# Patient Record
Sex: Male | Born: 1940 | Race: White | Hispanic: No | Marital: Married | Smoking: Current every day smoker
Health system: Southern US, Community
[De-identification: ages and names within clinical notes are randomized; demographics above are authoritative.]

## PROBLEM LIST (undated history)

## (undated) DIAGNOSIS — F32A Depression, unspecified: Secondary | ICD-10-CM

## (undated) DIAGNOSIS — F329 Major depressive disorder, single episode, unspecified: Secondary | ICD-10-CM

## (undated) DIAGNOSIS — I10 Essential (primary) hypertension: Secondary | ICD-10-CM

## (undated) DIAGNOSIS — I499 Cardiac arrhythmia, unspecified: Secondary | ICD-10-CM

## (undated) DIAGNOSIS — K5790 Diverticulosis of intestine, part unspecified, without perforation or abscess without bleeding: Secondary | ICD-10-CM

## (undated) DIAGNOSIS — R140 Abdominal distension (gaseous): Secondary | ICD-10-CM

## (undated) DIAGNOSIS — J40 Bronchitis, not specified as acute or chronic: Secondary | ICD-10-CM

## (undated) DIAGNOSIS — T462X5A Adverse effect of other antidysrhythmic drugs, initial encounter: Secondary | ICD-10-CM

## (undated) DIAGNOSIS — R05 Cough: Secondary | ICD-10-CM

## (undated) DIAGNOSIS — M810 Age-related osteoporosis without current pathological fracture: Secondary | ICD-10-CM

## (undated) DIAGNOSIS — R1032 Left lower quadrant pain: Secondary | ICD-10-CM

## (undated) DIAGNOSIS — R059 Cough, unspecified: Secondary | ICD-10-CM

## (undated) DIAGNOSIS — K469 Unspecified abdominal hernia without obstruction or gangrene: Secondary | ICD-10-CM

## (undated) DIAGNOSIS — I509 Heart failure, unspecified: Secondary | ICD-10-CM

## (undated) DIAGNOSIS — N189 Chronic kidney disease, unspecified: Secondary | ICD-10-CM

## (undated) DIAGNOSIS — E785 Hyperlipidemia, unspecified: Secondary | ICD-10-CM

## (undated) DIAGNOSIS — C801 Malignant (primary) neoplasm, unspecified: Secondary | ICD-10-CM

## (undated) DIAGNOSIS — R233 Spontaneous ecchymoses: Secondary | ICD-10-CM

## (undated) DIAGNOSIS — R238 Other skin changes: Secondary | ICD-10-CM

## (undated) HISTORY — DX: Heart failure, unspecified: I50.9

## (undated) HISTORY — DX: Malignant (primary) neoplasm, unspecified: C80.1

## (undated) HISTORY — DX: Cough: R05

## (undated) HISTORY — DX: Age-related osteoporosis without current pathological fracture: M81.0

## (undated) HISTORY — DX: Other skin changes: R23.8

## (undated) HISTORY — DX: Spontaneous ecchymoses: R23.3

## (undated) HISTORY — DX: Essential (primary) hypertension: I10

## (undated) HISTORY — DX: Adverse effect of other antidysrhythmic drugs, initial encounter: T46.2X5A

## (undated) HISTORY — PX: APPENDECTOMY: SHX54

## (undated) HISTORY — DX: Abdominal distension (gaseous): R14.0

## (undated) HISTORY — DX: Cough, unspecified: R05.9

## (undated) HISTORY — PX: CARDIAC CATHETERIZATION: SHX172

## (undated) HISTORY — DX: Left lower quadrant pain: R10.32

## (undated) HISTORY — DX: Unspecified abdominal hernia without obstruction or gangrene: K46.9

---

## 2003-10-30 HISTORY — PX: CARDIAC DEFIBRILLATOR REMOVAL: SHX1291

## 2009-05-12 ENCOUNTER — Encounter: Admission: RE | Admit: 2009-05-12 | Discharge: 2009-05-12 | Payer: Self-pay | Admitting: Family Medicine

## 2011-07-30 DIAGNOSIS — K469 Unspecified abdominal hernia without obstruction or gangrene: Secondary | ICD-10-CM

## 2011-07-30 HISTORY — DX: Unspecified abdominal hernia without obstruction or gangrene: K46.9

## 2011-08-24 ENCOUNTER — Ambulatory Visit (INDEPENDENT_AMBULATORY_CARE_PROVIDER_SITE_OTHER): Payer: Medicare Other | Admitting: General Surgery

## 2011-08-24 ENCOUNTER — Encounter (INDEPENDENT_AMBULATORY_CARE_PROVIDER_SITE_OTHER): Payer: Self-pay | Admitting: General Surgery

## 2011-08-24 VITALS — BP 118/74 | HR 64 | Temp 97.5°F | Resp 16 | Ht 70.0 in | Wt 153.0 lb

## 2011-08-24 DIAGNOSIS — K4090 Unilateral inguinal hernia, without obstruction or gangrene, not specified as recurrent: Secondary | ICD-10-CM

## 2011-08-24 NOTE — Progress Notes (Signed)
Chief Complaint  Patient presents with  . New Evaluation    eval of LIH     HPI Nicholas Herrera is a 70 y.o. male.  This patient is referred by Dr. Paulino Herrera for evaluation of a left inguinal hernia. He first started having discomfort in September and then subsequently noticed a bulge in his left groin which comes and goes. He has some associated discomfort which radiates to his testicle and he states that this is worse when he has multiple bowel movements. He also states that his discomfort is worse at night and is somewhat relieved with wearing a hernia belt. He denies any nausea vomiting or obstructive symptoms and has a colonoscopy approximately 5 years ago. He does have a defibrillator which has been turned off stent is followed by Dr. Katrinka Herrera is his cardiologist. He states that his last ejection fraction was noted to be at 40%. HPI  Past Medical History  Diagnosis Date  . Cancer     skin  . Cough   . Abdominal pain   . Left groin pain   . Hernia october 2012    [facility]  . Abdominal distention   . Easy bruising     Past Surgical History  Procedure Date  . Appendectomy   . Cardiac defibrillator removal 2005    Family History  Problem Relation Age of Onset  . Cancer Mother     Social History History  Substance Use Topics  . Smoking status: Current Everyday Smoker -- 1.0 packs/day for 0 years    Types: Cigars  . Smokeless tobacco: Never Used  . Alcohol Use: 3.5 oz/week    7 drink(s) per week    Allergies  Allergen Reactions  . Sulfur     Current Outpatient Prescriptions  Medication Sig Dispense Refill  . aspirin 325 MG tablet Take 325 mg by mouth daily.        . benazepril (LOTENSIN) 10 MG tablet       . carvedilol (COREG) 12.5 MG tablet       . Multiple Vitamins-Minerals (OCUVITE ADULT 50+ PO) Take by mouth daily.        . Multiple Vitamins-Minerals (ONE-A-DAY MENS 50+ ADVANTAGE PO) Take by mouth daily.        [name] NIASPAN 1000 MG CR tablet       . PARoxetine (PAXIL) 20  MG tablet       . simvastatin (ZOCOR) 40 MG tablet         Review of Systems Review of Systems  Respiratory: Positive for cough.   Gastrointestinal: Positive for abdominal pain and abdominal distention.  Hematological: Bruises/bleeds easily.  All other systems reviewed and are negative.    Blood pressure 118/74, pulse 64, temperature 97.5 F (36.4 C), temperature source Temporal, resp. rate 16, height 5\' 10"  (1.778 m), weight 153 lb (69.4 kg).  Physical Exam Physical Exam  Vitals reviewed. Constitutional: He is oriented to person, place, and time. He appears well-developed and well-nourished. No distress.  HENT:  Head: Normocephalic and atraumatic.  Mouth/Throat: No oropharyngeal exudate.  Eyes: Conjunctivae and EOM are normal. Pupils are equal, round, and reactive to light. Right eye exhibits no discharge. Left eye exhibits no discharge. No scleral icterus.  Neck: Normal range of motion. Neck supple. No tracheal deviation present.  Cardiovascular: Normal rate, regular rhythm and normal heart sounds.   Pulmonary/Chest: Effort normal and breath sounds normal. No stridor. No respiratory distress. He has no wheezes.  Abdominal: Soft. Bowel sounds are normal.  He exhibits no distension and no mass. There is no tenderness. There is no rebound and no guarding.  Genitourinary: Penis normal.       He has a reducible left inguinal hernia on exam without significant tenderness on exam today.  No evidence of right inguinal hernia with Valsalva.  Musculoskeletal: Normal range of motion. He exhibits no edema.  Neurological: He is alert and oriented to person, place, and time.  Skin: Skin is warm. No rash noted. He is not diaphoretic. No erythema. No pallor.  Psychiatric: He has a normal mood and affect. His behavior is normal. Judgment and thought content normal.    Data Reviewed  Assessment    Reducible left inguinal hernia  This hernia is reducible but is symptomatic. Patient would  like surgical repair due to his discomfort. I discussed with him the options for watchful waiting versus laparoscopic versus open repair and he would like to have open repair of his left inguinal hernia. I discussed with him the risks of infection, bleeding, pain, chronic pain, nerve injury, scarring and, and recurrence and loss of testicle and he expressed understanding and desires to proceed with open left inguinal hernia repair with mesh patches repair we will get official clearance from his cardiologist Dr. Katrinka Herrera.      Plan    We will plan for open left inguinal hernia repair with mesh and       Nicholas Herrera 08/24/2011, 2:19 PM

## 2011-09-05 ENCOUNTER — Other Ambulatory Visit (INDEPENDENT_AMBULATORY_CARE_PROVIDER_SITE_OTHER): Payer: Self-pay | Admitting: General Surgery

## 2011-09-06 ENCOUNTER — Other Ambulatory Visit (INDEPENDENT_AMBULATORY_CARE_PROVIDER_SITE_OTHER): Payer: Self-pay | Admitting: General Surgery

## 2011-09-07 ENCOUNTER — Encounter (HOSPITAL_COMMUNITY): Payer: Self-pay | Admitting: Respiratory Therapy

## 2011-09-07 ENCOUNTER — Encounter (HOSPITAL_COMMUNITY)
Admission: RE | Admit: 2011-09-07 | Discharge: 2011-09-07 | Disposition: A | Discharge status: Home / Self Care | Payer: Medicare Other | Source: Ambulatory Visit | Attending: Anesthesiology | Admitting: Anesthesiology

## 2011-09-07 ENCOUNTER — Encounter (HOSPITAL_COMMUNITY)
Admission: RE | Admit: 2011-09-07 | Discharge: 2011-09-07 | Disposition: A | Discharge status: Home / Self Care | Payer: Medicare Other | Source: Ambulatory Visit | Attending: General Surgery | Admitting: General Surgery

## 2011-09-07 ENCOUNTER — Encounter (HOSPITAL_COMMUNITY): Payer: Self-pay | Admitting: Pharmacy Technician

## 2011-09-07 ENCOUNTER — Encounter (HOSPITAL_COMMUNITY): Payer: Self-pay

## 2011-09-07 HISTORY — DX: Major depressive disorder, single episode, unspecified: F32.9

## 2011-09-07 HISTORY — DX: Diverticulosis of intestine, part unspecified, without perforation or abscess without bleeding: K57.90

## 2011-09-07 HISTORY — DX: Hyperlipidemia, unspecified: E78.5

## 2011-09-07 HISTORY — DX: Chronic kidney disease, unspecified: N18.9

## 2011-09-07 HISTORY — DX: Bronchitis, not specified as acute or chronic: J40

## 2011-09-07 HISTORY — DX: Depression, unspecified: F32.A

## 2011-09-07 HISTORY — DX: Cardiac arrhythmia, unspecified: I49.9

## 2011-09-07 LAB — BASIC METABOLIC PANEL
BUN: 12 mg/dL (ref 6–23)
CO2: 27 mEq/L (ref 19–32)
Calcium: 9.3 mg/dL (ref 8.4–10.5)
Chloride: 102 mEq/L (ref 96–112)
Creatinine, Ser: 0.92 mg/dL (ref 0.50–1.35)
GFR calc Af Amer: >90 mL/min (ref >90)
GFR calc non Af Amer: 84 mL/min — ABNORMAL LOW (ref >90)
Glucose, Bld: 91 mg/dL (ref 70–99)
Potassium: 4.2 mEq/L (ref 3.5–5.1)
Sodium: 139 mEq/L (ref 135–145)

## 2011-09-07 LAB — CBC
HCT: 43.2 % (ref 39.0–52.0)
Hemoglobin: 15.2 g/dL (ref 13.0–17.0)
MCH: 32.1 pg (ref 26.0–34.0)
MCHC: 35.2 g/dL (ref 30.0–36.0)
MCV: 91.1 fL (ref 78.0–100.0)
Platelets: 157 10*3/uL (ref 150–400)
RBC: 4.74 MIL/uL (ref 4.22–5.81)
RDW: 12.9 % (ref 11.5–15.5)
WBC: 9.1 10*3/uL (ref 4.0–10.5)

## 2011-09-07 LAB — SURGICAL PCR SCREEN
MRSA, PCR: NEGATIVE
Staphylococcus aureus: POSITIVE — AB

## 2011-09-07 NOTE — Pre-Procedure Instructions (Signed)
[name]  09/07/2011   Your procedure is scheduled on:  September 11, 2011  Report to [facility] at 0930 AM.  Call this number if you have problems the morning of surgery: [phone]   Remember:   Do not eat food:After Midnight.  Do not drink clear liquids: 4 Hours before arrival.(5:30am)  Take these medicines the morning of surgery with A SIP OF WATER: Coreg, Paxil   Do not wear jewelry, make-up or nail polish.  Do not wear lotions, powders, or perfumes. You may wear deodorant.  Do not shave 48 hours prior to surgery.  Do not bring valuables to the hospital.  Contacts, dentures or bridgework may not be worn into surgery.  Leave suitcase in the car. After surgery it may be brought to your room.  For patients admitted to the hospital, checkout time is 11:00 AM the day of discharge.   Patients discharged the day of surgery will not be allowed to drive home.  Name and phone number of your driver: Breeze Angell [phone]  Special Instructions: CHG Shower Use Special Wash: 1/2 bottle night before surgery and 1/2 bottle morning of surgery.   Please read over the following fact sheets that you were given: Pain Booklet, Coughing and Deep Breathing, MRSA Information and Surgical Site Infection Prevention

## 2011-09-07 NOTE — Progress Notes (Signed)
Called DR. Sherilyn Cooter Smith's office requested copy of recent EKG from August 2012, spoke with Shea Evans. Also faxed prescription for implanted cardiac device to Dr. Michaelle Copas office.

## 2011-09-07 NOTE — Progress Notes (Signed)
Patients defibrillator discussed with Chiropodist.  Physician order faxed to cardiologist Dr. Verdis Prime no need for anesthesia consult.

## 2011-09-10 ENCOUNTER — Encounter (HOSPITAL_COMMUNITY): Payer: Self-pay

## 2011-09-10 MED ORDER — CEFAZOLIN SODIUM 1-5 GM-% IV SOLN
1.0000 g | INTRAVENOUS | Status: DC
  Filled 2011-09-10: qty 50

## 2011-09-11 ENCOUNTER — Ambulatory Visit (HOSPITAL_COMMUNITY): Payer: Medicare Other | Admitting: Anesthesiology

## 2011-09-11 ENCOUNTER — Other Ambulatory Visit: Payer: Self-pay

## 2011-09-11 ENCOUNTER — Encounter (HOSPITAL_COMMUNITY): Payer: Self-pay | Admitting: Anesthesiology

## 2011-09-11 ENCOUNTER — Ambulatory Visit (HOSPITAL_COMMUNITY)
Admission: RE | Admit: 2011-09-11 | Discharge: 2011-09-11 | Disposition: A | Discharge status: Home / Self Care | Payer: Medicare Other | Source: Ambulatory Visit | Attending: General Surgery | Admitting: General Surgery

## 2011-09-11 ENCOUNTER — Encounter (HOSPITAL_COMMUNITY)
Admission: RE | Disposition: A | Discharge status: Home / Self Care | Payer: Self-pay | Source: Ambulatory Visit | Attending: General Surgery

## 2011-09-11 ENCOUNTER — Telehealth (INDEPENDENT_AMBULATORY_CARE_PROVIDER_SITE_OTHER): Payer: Self-pay

## 2011-09-11 DIAGNOSIS — K4090 Unilateral inguinal hernia, without obstruction or gangrene, not specified as recurrent: Secondary | ICD-10-CM | POA: Insufficient documentation

## 2011-09-11 DIAGNOSIS — Z01818 Encounter for other preprocedural examination: Secondary | ICD-10-CM | POA: Insufficient documentation

## 2011-09-11 DIAGNOSIS — Z01812 Encounter for preprocedural laboratory examination: Secondary | ICD-10-CM | POA: Insufficient documentation

## 2011-09-11 HISTORY — PX: INGUINAL HERNIA REPAIR: SHX194

## 2011-09-11 HISTORY — PX: HERNIA REPAIR: SHX51

## 2011-09-11 SURGERY — REPAIR, HERNIA, INGUINAL, ADULT
Anesthesia: Choice | Site: Groin | Laterality: Left | Wound class: Clean

## 2011-09-11 MED ORDER — MIDAZOLAM HCL 5 MG/5ML IJ SOLN
INTRAMUSCULAR | Status: DC | PRN
  Administered 2011-09-11: 2 mg via INTRAVENOUS

## 2011-09-11 MED ORDER — LIDOCAINE-EPINEPHRINE 1 %-1:100000 IJ SOLN
INTRAMUSCULAR | Status: DC | PRN
  Administered 2011-09-11: 30 mL

## 2011-09-11 MED ORDER — ROCURONIUM BROMIDE 100 MG/10ML IV SOLN
INTRAVENOUS | Status: DC | PRN
  Administered 2011-09-11 (×2): 5 mg via INTRAVENOUS
  Administered 2011-09-11: 50 mg via INTRAVENOUS

## 2011-09-11 MED ORDER — PROPOFOL 10 MG/ML IV EMUL
INTRAVENOUS | Status: DC | PRN
  Administered 2011-09-11: 100 mg via INTRAVENOUS
  Administered 2011-09-11: 30 mg via INTRAVENOUS

## 2011-09-11 MED ORDER — NEOSTIGMINE METHYLSULFATE 1 MG/ML IJ SOLN
INTRAMUSCULAR | Status: DC | PRN
  Administered 2011-09-11: 3 mg via INTRAVENOUS

## 2011-09-11 MED ORDER — FENTANYL CITRATE 0.05 MG/ML IJ SOLN
INTRAMUSCULAR | Status: DC | PRN
  Administered 2011-09-11: 100 ug via INTRAVENOUS
  Administered 2011-09-11: 25 ug via INTRAVENOUS
  Administered 2011-09-11: 50 ug via INTRAVENOUS
  Administered 2011-09-11: 25 ug via INTRAVENOUS

## 2011-09-11 MED ORDER — PROMETHAZINE HCL 25 MG/ML IJ SOLN: 6.2500 mg | INTRAMUSCULAR | Status: DC | PRN

## 2011-09-11 MED ORDER — ONDANSETRON HCL 4 MG/2ML IJ SOLN
INTRAMUSCULAR | Status: DC | PRN
  Administered 2011-09-11: 4 mg via INTRAVENOUS

## 2011-09-11 MED ORDER — CEFAZOLIN SODIUM 1-5 GM-% IV SOLN
INTRAVENOUS | Status: DC | PRN
  Administered 2011-09-11: 1 g via INTRAVENOUS

## 2011-09-11 MED ORDER — LACTATED RINGERS IV SOLN
INTRAVENOUS | Status: DC | PRN
  Administered 2011-09-11 (×2): via INTRAVENOUS

## 2011-09-11 MED ORDER — EPHEDRINE SULFATE 50 MG/ML IJ SOLN
INTRAMUSCULAR | Status: DC | PRN
  Administered 2011-09-11: 15 mg via INTRAVENOUS

## 2011-09-11 MED ORDER — HYDROMORPHONE HCL PF 1 MG/ML IJ SOLN: 0.2500 mg | INTRAMUSCULAR | Status: DC | PRN

## 2011-09-11 MED ORDER — MEPERIDINE HCL 25 MG/ML IJ SOLN: 6.2500 mg | INTRAMUSCULAR | Status: DC | PRN

## 2011-09-11 MED ORDER — ALBUMIN HUMAN 5 % IV SOLN
INTRAVENOUS | Status: DC | PRN
  Administered 2011-09-11: 12:00:00 via INTRAVENOUS

## 2011-09-11 MED ORDER — HYDROCODONE-ACETAMINOPHEN 5-500 MG PO TABS: 1.0000 | ORAL_TABLET | ORAL | Status: AC | PRN

## 2011-09-11 MED ORDER — GLYCOPYRROLATE 0.2 MG/ML IJ SOLN
INTRAMUSCULAR | Status: DC | PRN
  Administered 2011-09-11: .2 mg via INTRAVENOUS
  Administered 2011-09-11: .4 mg via INTRAVENOUS

## 2011-09-11 SURGICAL SUPPLY — 48 items
BLADE SURG 10 STRL SS (BLADE) ×2 IMPLANT
BLADE SURG 15 STRL LF DISP TIS (BLADE) ×1 IMPLANT
BLADE SURG 15 STRL SS (BLADE) ×1
BLADE SURG ROTATE 9660 (MISCELLANEOUS) IMPLANT
CANISTER SUCTION 2500CC (MISCELLANEOUS) ×2 IMPLANT
CHLORAPREP W/TINT 26ML (MISCELLANEOUS) ×2 IMPLANT
CLOTH BEACON ORANGE TIMEOUT ST (SAFETY) ×2 IMPLANT
COVER SURGICAL LIGHT HANDLE (MISCELLANEOUS) ×2 IMPLANT
DERMABOND ADVANCED (GAUZE/BANDAGES/DRESSINGS) ×1
DERMABOND ADVANCED .7 DNX12 (GAUZE/BANDAGES/DRESSINGS) ×1 IMPLANT
DRAIN PENROSE 1/2X12 LTX STRL (WOUND CARE) IMPLANT
DRAPE LAPAROSCOPIC ABDOMINAL (DRAPES) ×2 IMPLANT
ELECT CAUTERY BLADE 6.4 (BLADE) ×2 IMPLANT
ELECT REM PT RETURN 9FT ADLT (ELECTROSURGICAL) ×2
ELECTRODE REM PT RTRN 9FT ADLT (ELECTROSURGICAL) ×1 IMPLANT
GLOVE BIO SURGEON STRL SZ7 (GLOVE) ×2 IMPLANT
GLOVE BIOGEL PI IND STRL 7.0 (GLOVE) ×2 IMPLANT
GLOVE BIOGEL PI INDICATOR 7.0 (GLOVE) ×2
GLOVE SURG SS PI 6.5 STRL IVOR (GLOVE) ×4 IMPLANT
GLOVE SURG SS PI 7.5 STRL IVOR (GLOVE) ×4 IMPLANT
GOWN PREVENTION PLUS XLARGE (GOWN DISPOSABLE) ×2 IMPLANT
GOWN STRL NON-REIN LRG LVL3 (GOWN DISPOSABLE) ×2 IMPLANT
KIT BASIN OR (CUSTOM PROCEDURE TRAY) ×2 IMPLANT
KIT ROOM TURNOVER OR (KITS) ×2 IMPLANT
MESH ULTRAPRO 3X6 7.6X15CM (Mesh General) ×2 IMPLANT
NEEDLE HYPO 25GX1X1/2 BEV (NEEDLE) ×2 IMPLANT
NS IRRIG 1000ML POUR BTL (IV SOLUTION) ×2 IMPLANT
PACK SURGICAL SETUP 50X90 (CUSTOM PROCEDURE TRAY) ×2 IMPLANT
PAD ARMBOARD 7.5X6 YLW CONV (MISCELLANEOUS) ×2 IMPLANT
PAD DEFIB R2 (MISCELLANEOUS) ×2 IMPLANT
PENCIL BUTTON HOLSTER BLD 10FT (ELECTRODE) ×2 IMPLANT
SPECIMEN JAR SMALL (MISCELLANEOUS) IMPLANT
SPONGE INTESTINAL PEANUT (DISPOSABLE) ×2 IMPLANT
SPONGE LAP 18X18 X RAY DECT (DISPOSABLE) ×2 IMPLANT
SUT MNCRL AB 4-0 PS2 18 (SUTURE) ×2 IMPLANT
SUT PROLENE 2 0 SH DA (SUTURE) ×8 IMPLANT
SUT VIC AB 2-0 SH 27 (SUTURE) ×3
SUT VIC AB 2-0 SH 27XBRD (SUTURE) ×3 IMPLANT
SUT VIC AB 3-0 SH 27 (SUTURE) ×1
SUT VIC AB 3-0 SH 27X BRD (SUTURE) ×1 IMPLANT
SYR BULB 3OZ (MISCELLANEOUS) ×2 IMPLANT
SYR CONTROL 10ML LL (SYRINGE) ×2 IMPLANT
TOWEL OR 17X24 6PK STRL BLUE (TOWEL DISPOSABLE) ×2 IMPLANT
TOWEL OR 17X26 10 PK STRL BLUE (TOWEL DISPOSABLE) ×2 IMPLANT
TUBE CONNECTING 12X1/4 (SUCTIONS) IMPLANT
ULTRAPRO 3X6 ×2 IMPLANT
WATER STERILE IRR 1000ML POUR (IV SOLUTION) IMPLANT
YANKAUER SUCT BULB TIP NO VENT (SUCTIONS) IMPLANT

## 2011-09-11 NOTE — Progress Notes (Signed)
Call to Florentina Addison, at [facility]. She reports that pt.'s defibrillator was deactivated in 2008.  Ekg not available from within the past yr.

## 2011-09-11 NOTE — Progress Notes (Signed)
Via volunteer, Corrie Dandy, pt's wife notified that pt going to short stay.

## 2011-09-11 NOTE — Anesthesia Procedure Notes (Addendum)
Procedure Name: Intubation Date/Time: 09/11/2011 11:17 AM Performed by: Julianne Rice K Pre-anesthesia Checklist: Patient identified, Timeout performed, Emergency Drugs available, Suction available and Patient being monitored Patient Re-evaluated:Patient Re-evaluated prior to inductionOxygen Delivery Method: Circle System Utilized Preoxygenation: Pre-oxygenation with 100% oxygen Intubation Type: IV induction Ventilation: Mask ventilation without difficulty Laryngoscope Size: Mac and 3 Grade View: Grade II Tube type: Oral Tube size: 8.0 mm Number of attempts: 1 Airway Equipment and Method: stylet Placement Confirmation: ETT inserted through vocal cords under direct vision,  breath sounds checked- equal and bilateral and positive ETCO2 Secured at: 24 cm Tube secured with: Tape Dental Injury: Teeth and Oropharynx as per pre-operative assessment

## 2011-09-11 NOTE — Anesthesia Postprocedure Evaluation (Signed)
  Anesthesia Post-op Note  Patient: Nicholas Herrera  Procedure(s) Performed:  HERNIA REPAIR INGUINAL ADULT - open left inguinal hernia with mesh  Patient Location: PACU  Anesthesia Type: General  Level of Consciousness: awake and alert   Airway and Oxygen Therapy: Patient Spontanous Breathing  Post-op Pain: mild  Post-op Assessment: Post-op Vital signs reviewed, Patient's Cardiovascular Status Stable, Respiratory Function Stable, Patent Airway and No signs of Nausea or vomiting  Post-op Vital Signs: stable  Complications: No apparent anesthesia complications

## 2011-09-11 NOTE — Op Note (Signed)
NAME:  Nicholas Herrera, Nicholas Herrera NO.:  [id number]  MEDICAL RECORD NO.:  [id number]  LOCATION:  [facility]                         FACILITY:  [facility]  PHYSICIAN:  Lodema Pilot, MD       DATE OF BIRTH:  [birth year]  DATE OF PROCEDURE:  09/11/2011 DATE OF DISCHARGE:                              OPERATIVE REPORT   PROCEDURE:  Open left inguinal hernia repair with mesh.  PREOPERATIVE DIAGNOSIS:  Left inguinal hernia.  POSTOPERATIVE DIAGNOSIS:  Left inguinal hernia.  SURGEON:  Lodema Pilot, MD  ASSISTANT:  None.  ANESTHESIA:  General endotracheal anesthesia with 30 mL of 1% lidocaine with epinephrine and 0.25% Marcaine in a 50:50 mixture.  FLUIDS:  1000 mL of crystalloid and 250 mL of albumin.  ESTIMATED BLOOD LOSS:  Minimal.  DRAINS:  None.  SPECIMENS:  Hernia sac, but not sent to Pathology for permanent section.  COMPLICATIONS:  None apparent.  FINDINGS:  Indirect and direct inguinal defects repaired with 3 inch x 6 inch UltraPro mesh.  INDICATION FOR PROCEDURE:  Nicholas Herrera is a 70 year old male with a symptomatic left inguinal hernia since September who desires repair.  OPERATIVE DETAILS:  Nicholas Herrera was seen and evaluated in the preop area and risks and benefits of procedure were discussed in lay terms. Informed consent was obtained.  Surgical site was marked with the patient prior to anesthetic administration and prophylactic antibiotics were given.  General endotracheal tube anesthesia was obtained, and his abdomen was prepped and draped in a standard surgical fashion. Procedure time-out was performed with all operative team members to confirm proper patient, procedure, and a left inguinal oblique incision was made in the skin and dissection carried down to the subcutaneous tissue using Bovie electrocautery.  External oblique fascia was identified and sharply incised along the length of its fibers to the external ring and the ilioinguinal nerve was identified and  preserved. The spermatic cord was dissected circumferentially at the pubic tubercle and a Penrose drain was passed for gentle retraction.  The spermatic cord was skeletonized and on the anterior medial aspect of the cord there was an obvious hernia sac and this was dissected free from the cord structures.  He also had a direct hernia defect as well.  The indirect sac was dissected free from the cord and the sac was opened up and high ligation of the sac was performed and the hernia sac was divided, but not sent to Pathology.  Then the floor was imbricated over the direct defect with 2-0 Vicryl figure-of-eight sutures, and the internal ring was tightened.  Then a 3 inch x 6 inch piece of UltraPro mesh was cut to fit the inguinal canal and sutured in place at the pubic tubercle with 2-0 Prolene suture, and the suture was run along the shelving edge of the inguinal ligament.  A splint was placed on the lateral aspect of the mesh and the tails of mesh were passed around the cord to create a new internal ring and interrupted 2-0 Prolene sutures were placed medially and superiorly and laterally to secure the mesh to the abdominal wall.  The iliohypogastric nerve was identified and sutures were placed away from the  nerve with the mesh secured to the abdominal wall.  The tails of the mesh were tied together and the new internal ring was created.  The wound was then irrigated with sterile saline solution and noted to be hemostatic, and the external oblique fascia was approximated with a 2-0 Vicryl running suture in order to create a new external ring.  Then 30 mL of 1% lidocaine with epinephrine and 0.5% Marcaine with 50:50 mixture were injected in the wound and the Scarpa's fascia was approximated with a running 3-0 Vicryl suture.  The wound was again irrigated with sterile saline solution, and the skin edges were approximated with 4-0 Monocryl subcuticular suture.  Skin was washed and dried and  Dermabond was applied.  All sponge, needle, and instrument counts were correct at the end of the case and the patient tolerated the procedure well without apparent complications.          ______________________________ Lodema Pilot, MD     BL/MEDQ  D:  09/11/2011  T:  09/11/2011  Job:  [id number]

## 2011-09-11 NOTE — Telephone Encounter (Signed)
Left voice message with PO appointment info for patient.

## 2011-09-11 NOTE — Brief Op Note (Signed)
09/11/2011  12:57 PM  PATIENT:  Dolores Hoose  70 y.o. male  PRE-OPERATIVE DIAGNOSIS:  Hernia, inguinal, left [550.90]  POST-OPERATIVE DIAGNOSIS:  left inguinal hernia  PROCEDURE:  Procedure(s): HERNIA REPAIR INGUINAL ADULT  SURGEON:  Surgeon(s): Rulon Abide, DO  PHYSICIAN ASSISTANT:   ASSISTANTS: none   ANESTHESIA:   none  EBL:  Total I/O In: 1450 [I.V.:1200; IV Piggyback:250] Out: 25 [Blood:25]  BLOOD ADMINISTERED:none  DRAINS: none   LOCAL MEDICATIONS USED:  MARCAINE 15CC and LIDOCAINE 15CC  SPECIMEN:  No Specimen  DISPOSITION OF SPECIMEN:  N/A  COUNTS:  YES  TOURNIQUET:  * No tourniquets in log *  DICTATION: .Other Dictation: Dictation Number [id number]  PLAN OF CARE: Discharge to home after PACU  PATIENT DISPOSITION:  PACU - hemodynamically stable.   Delay start of Pharmacological VTE agent (>24hrs) due to surgical blood loss or risk of bleeding:  {YES/NO/NOT APPLICABLE:20182

## 2011-09-11 NOTE — Anesthesia Preprocedure Evaluation (Addendum)
Anesthesia Evaluation  Patient identified by MRN, date of birth, ID band Patient awake    Reviewed: Allergy & Precautions, H&P , NPO status , Patient's Chart, lab work & pertinent test results  Airway Mallampati: II TM Distance: >3 FB Neck ROM: full    Dental No notable dental hx. (+) Teeth Intact   Pulmonary neg pulmonary ROS,  clear to auscultation  Pulmonary exam normal       Cardiovascular hypertension, On Medications and On Home Beta Blockers + Cardiac Defibrillator regular Normal    Neuro/Psych Negative Neurological ROS  Negative Psych ROS   GI/Hepatic negative GI ROS, Neg liver ROS,   Endo/Other  Negative Endocrine ROS  Renal/GU negative Renal ROS  Genitourinary negative   Musculoskeletal   Abdominal   Peds  Hematology negative hematology ROS (+)   Anesthesia Other Findings   Reproductive/Obstetrics negative OB ROS                           Anesthesia Physical Anesthesia Plan  ASA: II  Anesthesia Plan: General   Post-op Pain Management:    Induction: Intravenous  Airway Management Planned: Oral ETT  Additional Equipment:   Intra-op Plan:   Post-operative Plan: Extubation in OR  Informed Consent: I have reviewed the patients History and Physical, chart, labs and discussed the procedure including the risks, benefits and alternatives for the proposed anesthesia with the patient or authorized representative who has indicated his/her understanding and acceptance.     Plan Discussed with: CRNA  Anesthesia Plan Comments:         Anesthesia Quick Evaluation

## 2011-09-11 NOTE — H&P (Signed)
Date of Initial H&P: 08/24/11  History reviewed, patient examined, no change in status, stable for surgery. Left side marked and risks of infection, bleeding, pain, scarring, recurrence, injury to vas deferens or testicle, bowel injury, and chronic pain again discussed and he desires to proceed with left inguinal hernia repair with mesh.  He has received cardiac clearance from his cardiologist.  Will plan for open LIH  [name], [name] 09/11/2011 10:30 AM

## 2011-09-11 NOTE — Preoperative (Signed)
Beta Blockers   Reason not to administer Beta Blockers:Not Applicable 

## 2011-09-11 NOTE — Transfer of Care (Signed)
Immediate Anesthesia Transfer of Care Note  Patient: Nicholas Herrera  Procedure(s) Performed:  HERNIA REPAIR INGUINAL ADULT - open left inguinal hernia with mesh  Patient Location: PACU  Anesthesia Type: General  Level of Consciousness: awake, alert  and oriented  Airway & Oxygen Therapy: Patient Spontanous Breathing and Patient connected to nasal cannula oxygen  Post-op Assessment: Report given to PACU RN, Post -op Vital signs reviewed and stable and Patient moving all extremities  Post vital signs: Reviewed and stable  Complications: No apparent anesthesia complications

## 2011-09-14 ENCOUNTER — Encounter (HOSPITAL_COMMUNITY): Payer: Self-pay | Admitting: General Surgery

## 2011-09-28 ENCOUNTER — Ambulatory Visit (INDEPENDENT_AMBULATORY_CARE_PROVIDER_SITE_OTHER): Payer: Medicare Other | Admitting: General Surgery

## 2011-09-28 ENCOUNTER — Encounter (INDEPENDENT_AMBULATORY_CARE_PROVIDER_SITE_OTHER): Payer: Self-pay | Admitting: General Surgery

## 2011-09-28 VITALS — BP 118/74 | HR 60 | Temp 97.7°F | Resp 16 | Ht 70.0 in | Wt 150.4 lb

## 2011-09-28 DIAGNOSIS — Z5189 Encounter for other specified aftercare: Secondary | ICD-10-CM

## 2011-09-28 DIAGNOSIS — Z4889 Encounter for other specified surgical aftercare: Secondary | ICD-10-CM

## 2011-09-28 NOTE — Progress Notes (Signed)
Subjective:     Patient ID: Nicholas Herrera, male   DOB: March 30, 1941, 70 y.o.   MRN: [id number]  HPI This patient follows up 2 weeks status post open left inguinal hernia repair with mesh. He is doing well but still has some occasional discomfort in the area.He states that his discomfort is improving and has gradually started to increase his normal activities but with increased activity he does have some increased discomfort. He states his bowels are functioning normally.  Review of Systems     Objective:   Physical Exam His incision is healing well with normal healing ridge. No evidence of recurrence with Valsalva. No other swelling or bulge in the region    Assessment:     Status post open left inguinal hernia repair with mesh-Doing well. I think his discomfort is appropriate for 2 weeks postoperative. It seems to be improving and there is no evidence recurrence I think that this should continue to improve with time.    Plan:     He will follow up in 2 months if his symptoms do not completely resolve. Otherwise he'll follow up in apparently this. He can gradually increase his activity and another 2 weeks 2 activity as tolerated.

## 2011-10-16 ENCOUNTER — Telehealth (INDEPENDENT_AMBULATORY_CARE_PROVIDER_SITE_OTHER): Payer: Self-pay | Admitting: General Surgery

## 2011-10-16 MED ORDER — HYDROCODONE-ACETAMINOPHEN 5-325 MG PO TABS: 1.0000 | ORAL_TABLET | Freq: Four times a day (QID) | ORAL | Status: AC | PRN

## 2011-10-16 NOTE — Telephone Encounter (Signed)
Patient is status post hernia repair asking for a refill of his hydrocodone. He has not had a refill. Per automatic refill protocol called norco 5/325 to [facility] [place]. Patient made aware.

## 2012-02-19 DIAGNOSIS — C44320 Squamous cell carcinoma of skin of unspecified parts of face: Secondary | ICD-10-CM | POA: Diagnosis not present

## 2012-02-19 DIAGNOSIS — L570 Actinic keratosis: Secondary | ICD-10-CM | POA: Diagnosis not present

## 2012-02-19 DIAGNOSIS — L820 Inflamed seborrheic keratosis: Secondary | ICD-10-CM | POA: Diagnosis not present

## 2012-02-19 DIAGNOSIS — D0430 Carcinoma in situ of skin of unspecified part of face: Secondary | ICD-10-CM | POA: Diagnosis not present

## 2012-02-19 DIAGNOSIS — D0439 Carcinoma in situ of skin of other parts of face: Secondary | ICD-10-CM | POA: Diagnosis not present

## 2012-06-11 DIAGNOSIS — R197 Diarrhea, unspecified: Secondary | ICD-10-CM | POA: Diagnosis not present

## 2012-06-11 DIAGNOSIS — K625 Hemorrhage of anus and rectum: Secondary | ICD-10-CM | POA: Diagnosis not present

## 2012-06-16 DIAGNOSIS — K625 Hemorrhage of anus and rectum: Secondary | ICD-10-CM | POA: Diagnosis not present

## 2012-06-24 DIAGNOSIS — I10 Essential (primary) hypertension: Secondary | ICD-10-CM | POA: Diagnosis not present

## 2012-06-24 DIAGNOSIS — E785 Hyperlipidemia, unspecified: Secondary | ICD-10-CM | POA: Diagnosis not present

## 2012-06-24 DIAGNOSIS — Z950 Presence of cardiac pacemaker: Secondary | ICD-10-CM | POA: Diagnosis not present

## 2012-06-24 DIAGNOSIS — I5040 Unspecified combined systolic (congestive) and diastolic (congestive) heart failure: Secondary | ICD-10-CM | POA: Diagnosis not present

## 2012-06-24 DIAGNOSIS — I739 Peripheral vascular disease, unspecified: Secondary | ICD-10-CM | POA: Diagnosis not present

## 2012-07-04 DIAGNOSIS — I739 Peripheral vascular disease, unspecified: Secondary | ICD-10-CM | POA: Diagnosis not present

## 2012-07-09 DIAGNOSIS — K625 Hemorrhage of anus and rectum: Secondary | ICD-10-CM | POA: Diagnosis not present

## 2012-07-09 DIAGNOSIS — K5730 Diverticulosis of large intestine without perforation or abscess without bleeding: Secondary | ICD-10-CM | POA: Diagnosis not present

## 2012-07-09 DIAGNOSIS — K648 Other hemorrhoids: Secondary | ICD-10-CM | POA: Diagnosis not present

## 2012-08-20 DIAGNOSIS — L821 Other seborrheic keratosis: Secondary | ICD-10-CM | POA: Diagnosis not present

## 2012-08-20 DIAGNOSIS — D0460 Carcinoma in situ of skin of unspecified upper limb, including shoulder: Secondary | ICD-10-CM | POA: Diagnosis not present

## 2012-08-20 DIAGNOSIS — C44319 Basal cell carcinoma of skin of other parts of face: Secondary | ICD-10-CM | POA: Diagnosis not present

## 2012-08-20 DIAGNOSIS — D485 Neoplasm of uncertain behavior of skin: Secondary | ICD-10-CM | POA: Diagnosis not present

## 2012-08-20 DIAGNOSIS — D044 Carcinoma in situ of skin of scalp and neck: Secondary | ICD-10-CM | POA: Diagnosis not present

## 2012-08-20 DIAGNOSIS — L570 Actinic keratosis: Secondary | ICD-10-CM | POA: Diagnosis not present

## 2012-09-08 DIAGNOSIS — C44320 Squamous cell carcinoma of skin of unspecified parts of face: Secondary | ICD-10-CM | POA: Diagnosis not present

## 2012-09-09 DIAGNOSIS — Z79899 Other long term (current) drug therapy: Secondary | ICD-10-CM | POA: Diagnosis not present

## 2012-09-09 DIAGNOSIS — R319 Hematuria, unspecified: Secondary | ICD-10-CM | POA: Diagnosis not present

## 2012-09-09 DIAGNOSIS — E785 Hyperlipidemia, unspecified: Secondary | ICD-10-CM | POA: Diagnosis not present

## 2012-09-09 DIAGNOSIS — F325 Major depressive disorder, single episode, in full remission: Secondary | ICD-10-CM | POA: Diagnosis not present

## 2012-09-09 DIAGNOSIS — I739 Peripheral vascular disease, unspecified: Secondary | ICD-10-CM | POA: Diagnosis not present

## 2012-09-09 DIAGNOSIS — I10 Essential (primary) hypertension: Secondary | ICD-10-CM | POA: Diagnosis not present

## 2012-09-09 DIAGNOSIS — Z0000 Encounter for general adult medical examination without abnormal findings: Secondary | ICD-10-CM | POA: Diagnosis not present

## 2012-09-15 DIAGNOSIS — C4442 Squamous cell carcinoma of skin of scalp and neck: Secondary | ICD-10-CM | POA: Diagnosis not present

## 2012-09-16 DIAGNOSIS — H2510 Age-related nuclear cataract, unspecified eye: Secondary | ICD-10-CM | POA: Diagnosis not present

## 2012-10-27 DIAGNOSIS — Z23 Encounter for immunization: Secondary | ICD-10-CM | POA: Diagnosis not present

## 2013-01-06 DIAGNOSIS — I739 Peripheral vascular disease, unspecified: Secondary | ICD-10-CM | POA: Diagnosis not present

## 2013-01-06 DIAGNOSIS — IMO0002 Reserved for concepts with insufficient information to code with codable children: Secondary | ICD-10-CM | POA: Diagnosis not present

## 2013-01-26 ENCOUNTER — Encounter (HOSPITAL_COMMUNITY): Payer: Self-pay | Admitting: Pharmacy Technician

## 2013-01-26 DIAGNOSIS — F17200 Nicotine dependence, unspecified, uncomplicated: Secondary | ICD-10-CM | POA: Diagnosis not present

## 2013-01-26 DIAGNOSIS — I70219 Atherosclerosis of native arteries of extremities with intermittent claudication, unspecified extremity: Secondary | ICD-10-CM | POA: Diagnosis not present

## 2013-01-27 ENCOUNTER — Other Ambulatory Visit: Payer: Self-pay | Admitting: Interventional Cardiology

## 2013-02-05 ENCOUNTER — Encounter (HOSPITAL_COMMUNITY)
Admission: RE | Disposition: A | Discharge status: Home / Self Care | Payer: Self-pay | Source: Ambulatory Visit | Attending: Interventional Cardiology

## 2013-02-05 ENCOUNTER — Ambulatory Visit (HOSPITAL_COMMUNITY)
Admission: RE | Admit: 2013-02-05 | Discharge: 2013-02-05 | Disposition: A | Discharge status: Home / Self Care | Payer: Medicare Other | Source: Ambulatory Visit | Attending: Interventional Cardiology | Admitting: Interventional Cardiology

## 2013-02-05 DIAGNOSIS — E7800 Pure hypercholesterolemia, unspecified: Secondary | ICD-10-CM | POA: Insufficient documentation

## 2013-02-05 DIAGNOSIS — I70219 Atherosclerosis of native arteries of extremities with intermittent claudication, unspecified extremity: Secondary | ICD-10-CM | POA: Insufficient documentation

## 2013-02-05 DIAGNOSIS — I7092 Chronic total occlusion of artery of the extremities: Secondary | ICD-10-CM | POA: Insufficient documentation

## 2013-02-05 DIAGNOSIS — I509 Heart failure, unspecified: Secondary | ICD-10-CM | POA: Insufficient documentation

## 2013-02-05 DIAGNOSIS — F17200 Nicotine dependence, unspecified, uncomplicated: Secondary | ICD-10-CM | POA: Diagnosis not present

## 2013-02-05 DIAGNOSIS — Z95810 Presence of automatic (implantable) cardiac defibrillator: Secondary | ICD-10-CM | POA: Diagnosis not present

## 2013-02-05 DIAGNOSIS — F329 Major depressive disorder, single episode, unspecified: Secondary | ICD-10-CM | POA: Diagnosis not present

## 2013-02-05 DIAGNOSIS — F3289 Other specified depressive episodes: Secondary | ICD-10-CM | POA: Insufficient documentation

## 2013-02-05 DIAGNOSIS — Z7982 Long term (current) use of aspirin: Secondary | ICD-10-CM | POA: Insufficient documentation

## 2013-02-05 DIAGNOSIS — Z79899 Other long term (current) drug therapy: Secondary | ICD-10-CM | POA: Diagnosis not present

## 2013-02-05 DIAGNOSIS — I10 Essential (primary) hypertension: Secondary | ICD-10-CM | POA: Diagnosis not present

## 2013-02-05 DIAGNOSIS — R7309 Other abnormal glucose: Secondary | ICD-10-CM | POA: Diagnosis not present

## 2013-02-05 DIAGNOSIS — Z85828 Personal history of other malignant neoplasm of skin: Secondary | ICD-10-CM | POA: Insufficient documentation

## 2013-02-05 DIAGNOSIS — K219 Gastro-esophageal reflux disease without esophagitis: Secondary | ICD-10-CM | POA: Diagnosis not present

## 2013-02-05 HISTORY — PX: LOWER EXTREMITY ANGIOGRAM: SHX5508

## 2013-02-05 LAB — PROTIME-INR
INR: 1.02 (ref 0.00–1.49)
Prothrombin Time: 13.3 seconds (ref 11.6–15.2)

## 2013-02-05 LAB — DIFFERENTIAL
Basophils Absolute: 0 10*3/uL (ref 0.0–0.1)
Basophils Relative: 1 % (ref 0–1)
Eosinophils Absolute: 0.2 10*3/uL (ref 0.0–0.7)
Eosinophils Relative: 3 % (ref 0–5)
Lymphocytes Relative: 32 % (ref 12–46)
Lymphs Abs: 2.5 10*3/uL (ref 0.7–4.0)
Monocytes Absolute: 0.7 10*3/uL (ref 0.1–1.0)
Monocytes Relative: 9 % (ref 3–12)
Neutro Abs: 4.3 10*3/uL (ref 1.7–7.7)
Neutrophils Relative %: 56 % (ref 43–77)

## 2013-02-05 LAB — CBC
HCT: 42.1 % (ref 39.0–52.0)
Hemoglobin: 15.2 g/dL (ref 13.0–17.0)
MCH: 32.8 pg (ref 26.0–34.0)
MCHC: 36.1 g/dL — ABNORMAL HIGH (ref 30.0–36.0)
MCV: 90.7 fL (ref 78.0–100.0)
Platelets: 169 10*3/uL (ref 150–400)
RBC: 4.64 MIL/uL (ref 4.22–5.81)
RDW: 12.9 % (ref 11.5–15.5)
WBC: 7.6 10*3/uL (ref 4.0–10.5)

## 2013-02-05 SURGERY — ANGIOGRAM, LOWER EXTREMITY
Anesthesia: LOCAL | Laterality: Right

## 2013-02-05 MED ORDER — ASPIRIN 81 MG PO CHEW: 81.0000 mg | CHEWABLE_TABLET | Freq: Every day | ORAL | Status: DC

## 2013-02-05 MED ORDER — DIAZEPAM 5 MG PO TABS
5.0000 mg | ORAL_TABLET | ORAL | Status: AC
  Administered 2013-02-05: 5 mg via ORAL
  Filled 2013-02-05: qty 1

## 2013-02-05 MED ORDER — ACETAMINOPHEN 325 MG PO TABS: 650.0000 mg | ORAL_TABLET | ORAL | Status: DC | PRN

## 2013-02-05 MED ORDER — CLOPIDOGREL BISULFATE 75 MG PO TABS: 75.0000 mg | ORAL_TABLET | Freq: Every day | ORAL | Status: DC

## 2013-02-05 MED ORDER — SODIUM CHLORIDE 0.9 % IV SOLN: 250.0000 mL | INTRAVENOUS | Status: DC | PRN

## 2013-02-05 MED ORDER — FENTANYL CITRATE 0.05 MG/ML IJ SOLN
INTRAMUSCULAR | Status: AC
  Filled 2013-02-05: qty 2

## 2013-02-05 MED ORDER — SODIUM CHLORIDE 0.9 % IJ SOLN: 3.0000 mL | INTRAMUSCULAR | Status: DC | PRN

## 2013-02-05 MED ORDER — SODIUM CHLORIDE 0.9 % IV SOLN: 1.0000 mL/kg/h | INTRAVENOUS | Status: DC

## 2013-02-05 MED ORDER — FAMOTIDINE IN NACL 20-0.9 MG/50ML-% IV SOLN
INTRAVENOUS | Status: AC
  Filled 2013-02-05: qty 50

## 2013-02-05 MED ORDER — ONDANSETRON HCL 4 MG/2ML IJ SOLN: 4.0000 mg | Freq: Four times a day (QID) | INTRAMUSCULAR | Status: DC | PRN

## 2013-02-05 MED ORDER — HEPARIN (PORCINE) IN NACL 2-0.9 UNIT/ML-% IJ SOLN
INTRAMUSCULAR | Status: AC
  Filled 2013-02-05: qty 1500

## 2013-02-05 MED ORDER — CLOPIDOGREL BISULFATE 300 MG PO TABS
ORAL_TABLET | ORAL | Status: AC
  Filled 2013-02-05: qty 2

## 2013-02-05 MED ORDER — LIDOCAINE HCL (PF) 1 % IJ SOLN
INTRAMUSCULAR | Status: AC
  Filled 2013-02-05: qty 30

## 2013-02-05 MED ORDER — ASPIRIN 81 MG PO CHEW
324.0000 mg | CHEWABLE_TABLET | ORAL | Status: AC
  Administered 2013-02-05: 324 mg via ORAL
  Filled 2013-02-05: qty 4

## 2013-02-05 MED ORDER — SODIUM CHLORIDE 0.9 % IV SOLN
INTRAVENOUS | Status: DC
  Administered 2013-02-05: 75 mL/h via INTRAVENOUS

## 2013-02-05 MED ORDER — MIDAZOLAM HCL 2 MG/2ML IJ SOLN
INTRAMUSCULAR | Status: AC
  Filled 2013-02-05: qty 2

## 2013-02-05 MED ORDER — SODIUM CHLORIDE 0.9 % IJ SOLN: 3.0000 mL | Freq: Two times a day (BID) | INTRAMUSCULAR | Status: DC

## 2013-02-05 NOTE — Progress Notes (Signed)
Up and walked and tol well left groin stable; no bleeding or hematoma

## 2013-02-05 NOTE — Research (Signed)
[facility] Informed Consent   Subject Name: Nicholas Herrera  Subject met inclusion and exclusion criteria.  The informed consent form, study requirements and expectations were reviewed with the subject and questions and concerns were addressed prior to the signing of the consent form.  The subject verbalized understanding of the trail requirements.  The subject agreed to participate in the [facility]  trial and signed the informed consent.  The informed consent was obtained prior to performance of any protocol-specific procedures for the subject.  A copy of the signed informed consent was given to the subject and a copy was placed in the subject's medical record.  Cherrie Distance Jr. 02/05/2013, [phone]

## 2013-02-05 NOTE — CV Procedure (Addendum)
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  The patient verbalized understanding and wanted to proceed.  Informed written consent was obtained.  PROCEDURE TECHNIQUE:  After Xylocaine anesthesia a 11F sheath was placed in the left femoral artery with a single anterior needle wall stick.  A pigtail catheter was advanced to the abdominal aorta.  Power injection of contrast was performed in the AP projection to image the abdominal aorta.  The pigtail catheter was withdrawn to the aortoiliac bifurcation.  A pelvic angiogram was performed. The pigtail catheter was removed.  A selective left lower extremity angiogram was performed  through the left common femoral sheath.  A crossover catheter was advanced to the aortoiliac bifurcation and a woolly wire was advanced to the right common femoral artery.  The catheter was changed out for a 4 French straight catheter.  A selective right lower extremity angiogram was performed through the straight catheter which was in the right external iliac artery.  The pressure gradient across the right common iliac stenosis was checked with a pullback.  After the intervention, the pigtail catheter was placed back into the distal aorta and images of the right common iliac were obtained to make sure that there had been no disruption of the plaque.  Please see below for angiographic results.    INTERVENTIONAL NARRATIVE: A 6 French Terumo sheath was advanced over an Amplatz wire  From the left groin into the right external iliac artery.  The patient was randomized in the Endomax study and received anticoagulation per the study protocol.  A Sparta core wire along with the Viance chronic total occlusion device was advanced to the distal SFA.The  Viance catheter crossed the chronic total occlusion in the right popliteal artery.  Start core was advanced distally and was in the true lumen.  A 5.0 x 60 mm balloon was used to predilate the area of disease.  A 6.0 x 100 mm length [employer] self-expanding stent was deployed.  The 5.0 balloon was used to post dilate the stent.  There is an excellent angiographic result.  There was some mild disease more distally in the popliteal but this did not appear to be hemodynamically significant.  At the end of the procedure, palpable right  PT/DP pulses were present.  A Mynx  closure device was used for hemostasis in the left groin.     CONTRAST:  Total of 183 cc.  COMPLICATIONS:  None.    HEMODYNAMICS:  Aortic pressure was 147/58; Right external iliac pressure 115/60    ANGIOGRAPHIC DATA:    Mild aortic atherosclerosis.  No abdominal aortic aneurysm.  No renal artery stenosis.  Bilateral single renal arteries. In the right common iliac artery, there appeared to be a 50% stenosis which was eccentric.  There is a 20-30 mm pressure gradient across the stenosis when pullback was performed with the sheath.  Subsequent angiography did not reveal severe stenosis.  The right internal iliac and external iliac arteries have mild disease but are otherwise widely patent.  The right common femoral artery is widely patent.  The right profunda femoral artery is also widely patent.  The right proximal to mid SFA has only mild disease.  In the distal SFA, there is moderate atherosclerosis leading into the occlusion of the popliteal artery on the right.  Collaterals from the profunda femoral artery and more proximal SFA help the distal vasculature reconstitute.  There is three-vessel runoff which is widely patent below the right knee.     The left common iliac, and external iliac artery has mild diffuse atherosclerosis.  The left internal iliac artery is patent.  The left common femoral and profunda femoral arteries are  widely patent.  There is mild diffuse atherosclerosis throughout the left SFA.  The left popliteal artery is widely patent.  There is patent 3 vessel runoff below the left knee.    IMPRESSIONS:  1. 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  6.0 x 100 self expanding stent postdilated with a 5.0 balloon.  The stent extends from the proximal popliteal into the distal SFA. 2. 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  Mild disease throughout the left iliac system. 3. Widely patent left SFA.   4. Three-vessel runoff below both knees. 5. Of note, the patient was enrolled in the EndoMax study.  RECOMMENDATION: Continue with regular walking.   Continue dual antiplatelet therapy for at least a month.  He'll be discharged later today.  Continue aggressive secondary prevention.

## 2013-02-05 NOTE — H&P (Signed)
  Date of Initial H&P: 01/26/13  History reviewed, patient examined, no change in status, stable for surgery.

## 2013-02-11 DIAGNOSIS — C44721 Squamous cell carcinoma of skin of unspecified lower limb, including hip: Secondary | ICD-10-CM | POA: Diagnosis not present

## 2013-02-11 DIAGNOSIS — D045 Carcinoma in situ of skin of trunk: Secondary | ICD-10-CM | POA: Diagnosis not present

## 2013-02-11 DIAGNOSIS — D485 Neoplasm of uncertain behavior of skin: Secondary | ICD-10-CM | POA: Diagnosis not present

## 2013-02-11 DIAGNOSIS — D044 Carcinoma in situ of skin of scalp and neck: Secondary | ICD-10-CM | POA: Diagnosis not present

## 2013-02-11 DIAGNOSIS — L821 Other seborrheic keratosis: Secondary | ICD-10-CM | POA: Diagnosis not present

## 2013-02-11 DIAGNOSIS — Z85828 Personal history of other malignant neoplasm of skin: Secondary | ICD-10-CM | POA: Diagnosis not present

## 2013-02-11 DIAGNOSIS — L570 Actinic keratosis: Secondary | ICD-10-CM | POA: Diagnosis not present

## 2013-02-16 ENCOUNTER — Encounter: Payer: Self-pay | Admitting: *Deleted

## 2013-02-26 DIAGNOSIS — F17200 Nicotine dependence, unspecified, uncomplicated: Secondary | ICD-10-CM | POA: Diagnosis not present

## 2013-02-26 DIAGNOSIS — I70219 Atherosclerosis of native arteries of extremities with intermittent claudication, unspecified extremity: Secondary | ICD-10-CM | POA: Diagnosis not present

## 2013-03-09 DIAGNOSIS — E785 Hyperlipidemia, unspecified: Secondary | ICD-10-CM | POA: Diagnosis not present

## 2013-03-09 DIAGNOSIS — I10 Essential (primary) hypertension: Secondary | ICD-10-CM | POA: Diagnosis not present

## 2013-03-09 DIAGNOSIS — R7309 Other abnormal glucose: Secondary | ICD-10-CM | POA: Diagnosis not present

## 2013-03-09 DIAGNOSIS — F325 Major depressive disorder, single episode, in full remission: Secondary | ICD-10-CM | POA: Diagnosis not present

## 2013-03-09 DIAGNOSIS — I739 Peripheral vascular disease, unspecified: Secondary | ICD-10-CM | POA: Diagnosis not present

## 2013-03-09 DIAGNOSIS — F17200 Nicotine dependence, unspecified, uncomplicated: Secondary | ICD-10-CM | POA: Diagnosis not present

## 2013-06-30 DIAGNOSIS — I10 Essential (primary) hypertension: Secondary | ICD-10-CM | POA: Diagnosis not present

## 2013-06-30 DIAGNOSIS — I5040 Unspecified combined systolic (congestive) and diastolic (congestive) heart failure: Secondary | ICD-10-CM | POA: Diagnosis not present

## 2013-06-30 DIAGNOSIS — Z950 Presence of cardiac pacemaker: Secondary | ICD-10-CM | POA: Diagnosis not present

## 2013-06-30 DIAGNOSIS — I739 Peripheral vascular disease, unspecified: Secondary | ICD-10-CM | POA: Diagnosis not present

## 2013-06-30 DIAGNOSIS — I2510 Atherosclerotic heart disease of native coronary artery without angina pectoris: Secondary | ICD-10-CM | POA: Diagnosis not present

## 2013-07-31 ENCOUNTER — Ambulatory Visit (HOSPITAL_COMMUNITY): Payer: Medicare Other | Attending: Cardiovascular Disease

## 2013-07-31 DIAGNOSIS — I739 Peripheral vascular disease, unspecified: Secondary | ICD-10-CM | POA: Diagnosis not present

## 2013-07-31 DIAGNOSIS — I10 Essential (primary) hypertension: Secondary | ICD-10-CM | POA: Insufficient documentation

## 2013-07-31 DIAGNOSIS — E785 Hyperlipidemia, unspecified: Secondary | ICD-10-CM | POA: Insufficient documentation

## 2013-07-31 DIAGNOSIS — I70209 Unspecified atherosclerosis of native arteries of extremities, unspecified extremity: Secondary | ICD-10-CM | POA: Diagnosis not present

## 2013-07-31 DIAGNOSIS — I2510 Atherosclerotic heart disease of native coronary artery without angina pectoris: Secondary | ICD-10-CM | POA: Insufficient documentation

## 2013-07-31 DIAGNOSIS — F17200 Nicotine dependence, unspecified, uncomplicated: Secondary | ICD-10-CM | POA: Diagnosis not present

## 2013-08-13 DIAGNOSIS — C44611 Basal cell carcinoma of skin of unspecified upper limb, including shoulder: Secondary | ICD-10-CM | POA: Diagnosis not present

## 2013-08-13 DIAGNOSIS — C4441 Basal cell carcinoma of skin of scalp and neck: Secondary | ICD-10-CM | POA: Diagnosis not present

## 2013-08-13 DIAGNOSIS — C44320 Squamous cell carcinoma of skin of unspecified parts of face: Secondary | ICD-10-CM | POA: Diagnosis not present

## 2013-08-13 DIAGNOSIS — C44711 Basal cell carcinoma of skin of unspecified lower limb, including hip: Secondary | ICD-10-CM | POA: Diagnosis not present

## 2013-08-13 DIAGNOSIS — Z85828 Personal history of other malignant neoplasm of skin: Secondary | ICD-10-CM | POA: Diagnosis not present

## 2013-08-13 DIAGNOSIS — L570 Actinic keratosis: Secondary | ICD-10-CM | POA: Diagnosis not present

## 2013-08-13 DIAGNOSIS — D485 Neoplasm of uncertain behavior of skin: Secondary | ICD-10-CM | POA: Diagnosis not present

## 2013-08-13 DIAGNOSIS — C44319 Basal cell carcinoma of skin of other parts of face: Secondary | ICD-10-CM | POA: Diagnosis not present

## 2013-08-25 DIAGNOSIS — Z23 Encounter for immunization: Secondary | ICD-10-CM | POA: Diagnosis not present

## 2013-09-02 ENCOUNTER — Encounter: Payer: Self-pay | Admitting: Interventional Cardiology

## 2013-09-02 DIAGNOSIS — F329 Major depressive disorder, single episode, unspecified: Secondary | ICD-10-CM | POA: Insufficient documentation

## 2013-09-02 DIAGNOSIS — C801 Malignant (primary) neoplasm, unspecified: Secondary | ICD-10-CM | POA: Insufficient documentation

## 2013-09-02 DIAGNOSIS — F32A Depression, unspecified: Secondary | ICD-10-CM | POA: Insufficient documentation

## 2013-09-02 DIAGNOSIS — E785 Hyperlipidemia, unspecified: Secondary | ICD-10-CM | POA: Insufficient documentation

## 2013-09-02 DIAGNOSIS — N189 Chronic kidney disease, unspecified: Secondary | ICD-10-CM | POA: Insufficient documentation

## 2013-09-02 DIAGNOSIS — I499 Cardiac arrhythmia, unspecified: Secondary | ICD-10-CM | POA: Insufficient documentation

## 2013-09-03 ENCOUNTER — Ambulatory Visit (INDEPENDENT_AMBULATORY_CARE_PROVIDER_SITE_OTHER): Payer: Medicare Other | Admitting: Interventional Cardiology

## 2013-09-03 ENCOUNTER — Encounter: Payer: Self-pay | Admitting: Interventional Cardiology

## 2013-09-03 VITALS — BP 122/68 | HR 80 | Ht 69.0 in | Wt 158.0 lb

## 2013-09-03 DIAGNOSIS — F17200 Nicotine dependence, unspecified, uncomplicated: Secondary | ICD-10-CM | POA: Insufficient documentation

## 2013-09-03 DIAGNOSIS — I739 Peripheral vascular disease, unspecified: Secondary | ICD-10-CM

## 2013-09-03 NOTE — Patient Instructions (Signed)
Your physician wants you to follow-up in: 6 months with Dr. Varanasi. You will receive a reminder letter in the mail two months in advance. If you don't receive a letter, please call our office to schedule the follow-up appointment.  Your physician recommends that you continue on your current medications as directed. Please refer to the Current Medication list given to you today.  

## 2013-09-03 NOTE — Progress Notes (Signed)
Patient ID: Nicholas Herrera, male   DOB: [birth year], 72 y.o.   MRN: [id number]    [address] [place], [place]  [address] Phone: [phone] Fax:  [phone]  Date:  09/03/2013   ID:  Nicholas Herrera, DOB 02-14-1941, MRN [id number]  PCP:  Emeterio Reeve, MD      History of Present Illness: Nicholas Herrera is a 72 y.o. male who had a right SFA stent for a chronic total occlusion. He has done very well. He has no problems with pain in his calves when he walks. His stamina and ability to walk has improved. He continues to smoke. He has seen Dr. Katrinka Blazing for his other cardiac issues.    Wt Readings from Last 3 Encounters:  09/03/13 158 lb (71.668 kg)  02/05/13 150 lb (68.04 kg)  02/05/13 150 lb (68.04 kg)     Past Medical History  Diagnosis Date  . Cough   . Abdominal pain   . Left groin pain   . Hernia october 2012    [facility]  . Abdominal distention   . Easy bruising   . Hyperlipidemia     takes zocor  . Bronchitis     hx of  . Inguinal hernia   . Chronic kidney disease     has small stones, no treatment  . Hematuria     hx of  . Diverticulosis     hx of  . Cancer     skin  . Depression     takes paxil  . Dysrhythmia   . HTN (hypertension)   . CHF (congestive heart failure)      (hx of EF 27%) s/p AICD 2004. Most recent LVEF > 40% , 8/11    Current Outpatient Prescriptions  Medication Sig Dispense Refill  . aspirin 81 MG chewable tablet Chew 81 mg by mouth daily.      . benazepril (LOTENSIN) 10 MG tablet Take 10 mg by mouth daily.       . beta carotene w/minerals (OCUVITE) tablet Take 1 tablet by mouth daily.      . carvedilol (COREG) 12.5 MG tablet Take 12.5 mg by mouth 2 (two) times daily with a meal.       . clopidogrel (PLAVIX) 75 MG tablet Take 1 tablet (75 mg total) by mouth daily with breakfast.  30 tablet  11  . doxycycline (VIBRAMYCIN) 100 MG capsule Take 100 mg by mouth 2 (two) times daily.      . Multiple Vitamins-Minerals (ONE-A-DAY MENS  50+ ADVANTAGE PO) Take 1 tablet by mouth daily.       [name] NIASPAN 1000 MG CR tablet Take 1,000 mg by mouth at bedtime.       [name] PARoxetine (PAXIL) 20 MG tablet Take 20 mg by mouth every morning.       . simvastatin (ZOCOR) 40 MG tablet Take 40 mg by mouth at bedtime.        No current facility-administered medications for this visit.    Allergies:    Allergies  Allergen Reactions  . Sulfur     "childhood allergy"    Social History:  The patient  reports that he has been smoking Cigars.  He has never used smokeless tobacco. He reports that he drinks about 3.5 ounces of alcohol per week. He reports that he does not use illicit drugs.   Family History:  The patient's family history includes Cancer in his mother.   ROS:  Please see the history of present illness.  No nausea, vomiting.  No fevers, chills.  No focal weakness.  No dysuria.   All other systems reviewed and negative.   PHYSICAL EXAM: VS:  BP 122/68  Pulse 80  Ht 5\' 9"  (1.753 m)  Wt 158 lb (71.668 kg)  BMI 23.32 kg/m2 Well nourished, well developed, in no acute distress HEENT: normal Neck: no JVD, no carotid bruits Cardiac:  normal S1, S2; RRR;  Lungs:  clear to auscultation bilaterally, no wheezing, rhonchi or rales Abd: soft, nontender, no hepatomegaly Ext: no edema, 2+ posterior tibial pulses bilaterally, 2+ dorsalis pedis pulses bilaterally Skin: warm and dry Neuro:   no focal abnormalities noted      ASSESSMENT AND PLAN:  1. Peripheral arterial disease: Doing well status post right SFA stent. Continue regular walking. He really needs to stop smoking. I reviewed his most recent lower chimney study showing a patent stent and normal ABIs bilaterally. He did have a 50% right common iliac stenosis noted at the time of angiogram. No symptoms from this at this time. We'll plan for followup lower extremity study in about 6 months.  Signed, Fredric Mare, MD, [facility] 09/03/2013 9:35 AM

## 2013-09-10 DIAGNOSIS — C44211 Basal cell carcinoma of skin of unspecified ear and external auricular canal: Secondary | ICD-10-CM | POA: Diagnosis not present

## 2013-09-10 DIAGNOSIS — Z85828 Personal history of other malignant neoplasm of skin: Secondary | ICD-10-CM | POA: Diagnosis not present

## 2013-09-10 DIAGNOSIS — C44320 Squamous cell carcinoma of skin of unspecified parts of face: Secondary | ICD-10-CM | POA: Diagnosis not present

## 2013-09-18 DIAGNOSIS — H2510 Age-related nuclear cataract, unspecified eye: Secondary | ICD-10-CM | POA: Diagnosis not present

## 2013-09-18 DIAGNOSIS — H11159 Pinguecula, unspecified eye: Secondary | ICD-10-CM | POA: Diagnosis not present

## 2013-09-23 ENCOUNTER — Encounter: Payer: Self-pay | Admitting: Interventional Cardiology

## 2013-10-05 DIAGNOSIS — Z79899 Other long term (current) drug therapy: Secondary | ICD-10-CM | POA: Diagnosis not present

## 2013-10-05 DIAGNOSIS — I10 Essential (primary) hypertension: Secondary | ICD-10-CM | POA: Diagnosis not present

## 2013-10-05 DIAGNOSIS — R7309 Other abnormal glucose: Secondary | ICD-10-CM | POA: Diagnosis not present

## 2013-10-05 DIAGNOSIS — I5040 Unspecified combined systolic (congestive) and diastolic (congestive) heart failure: Secondary | ICD-10-CM | POA: Diagnosis not present

## 2013-10-05 DIAGNOSIS — I2510 Atherosclerotic heart disease of native coronary artery without angina pectoris: Secondary | ICD-10-CM | POA: Diagnosis not present

## 2013-10-05 DIAGNOSIS — R259 Unspecified abnormal involuntary movements: Secondary | ICD-10-CM | POA: Diagnosis not present

## 2013-10-05 DIAGNOSIS — Z0000 Encounter for general adult medical examination without abnormal findings: Secondary | ICD-10-CM | POA: Diagnosis not present

## 2013-10-05 DIAGNOSIS — Z125 Encounter for screening for malignant neoplasm of prostate: Secondary | ICD-10-CM | POA: Diagnosis not present

## 2014-02-02 DIAGNOSIS — J209 Acute bronchitis, unspecified: Secondary | ICD-10-CM | POA: Diagnosis not present

## 2014-02-11 DIAGNOSIS — C44211 Basal cell carcinoma of skin of unspecified ear and external auricular canal: Secondary | ICD-10-CM | POA: Diagnosis not present

## 2014-02-11 DIAGNOSIS — D0439 Carcinoma in situ of skin of other parts of face: Secondary | ICD-10-CM | POA: Diagnosis not present

## 2014-02-11 DIAGNOSIS — D0430 Carcinoma in situ of skin of unspecified part of face: Secondary | ICD-10-CM | POA: Diagnosis not present

## 2014-02-11 DIAGNOSIS — D485 Neoplasm of uncertain behavior of skin: Secondary | ICD-10-CM | POA: Diagnosis not present

## 2014-02-11 DIAGNOSIS — D0460 Carcinoma in situ of skin of unspecified upper limb, including shoulder: Secondary | ICD-10-CM | POA: Diagnosis not present

## 2014-02-11 DIAGNOSIS — L570 Actinic keratosis: Secondary | ICD-10-CM | POA: Diagnosis not present

## 2014-02-11 DIAGNOSIS — Z85828 Personal history of other malignant neoplasm of skin: Secondary | ICD-10-CM | POA: Diagnosis not present

## 2014-02-11 DIAGNOSIS — D044 Carcinoma in situ of skin of scalp and neck: Secondary | ICD-10-CM | POA: Diagnosis not present

## 2014-02-22 ENCOUNTER — Other Ambulatory Visit (HOSPITAL_COMMUNITY): Payer: Self-pay | Admitting: Cardiology

## 2014-02-22 DIAGNOSIS — I739 Peripheral vascular disease, unspecified: Secondary | ICD-10-CM

## 2014-02-23 ENCOUNTER — Encounter: Payer: Self-pay | Admitting: Cardiology

## 2014-02-23 ENCOUNTER — Ambulatory Visit (HOSPITAL_COMMUNITY): Payer: Medicare Other | Attending: Cardiology | Admitting: Cardiology

## 2014-02-23 DIAGNOSIS — I739 Peripheral vascular disease, unspecified: Secondary | ICD-10-CM | POA: Diagnosis not present

## 2014-02-23 NOTE — Progress Notes (Signed)
Lower arterial duplex bilateral and ABI complete

## 2014-02-25 ENCOUNTER — Telehealth: Payer: Self-pay

## 2014-02-25 NOTE — Telephone Encounter (Signed)
Message copied by Lamar Laundry on Thu Feb 25, 2014  4:25 PM ------      Message from: Daneen Schick      Created: Thu Feb 25, 2014  4:01 PM       LE doppler is stable. Mild evidence of decreased flow. ------

## 2014-02-25 NOTE — Telephone Encounter (Signed)
pt aware of ABI and  LE doppler is stable. Mild evidence of decreased flow.pt verbalized

## 2014-03-10 DIAGNOSIS — Z85828 Personal history of other malignant neoplasm of skin: Secondary | ICD-10-CM | POA: Diagnosis not present

## 2014-03-10 DIAGNOSIS — C44221 Squamous cell carcinoma of skin of unspecified ear and external auricular canal: Secondary | ICD-10-CM | POA: Diagnosis not present

## 2014-04-15 DIAGNOSIS — R7309 Other abnormal glucose: Secondary | ICD-10-CM | POA: Diagnosis not present

## 2014-04-15 DIAGNOSIS — J309 Allergic rhinitis, unspecified: Secondary | ICD-10-CM | POA: Diagnosis not present

## 2014-04-15 DIAGNOSIS — F17200 Nicotine dependence, unspecified, uncomplicated: Secondary | ICD-10-CM | POA: Diagnosis not present

## 2014-06-15 DIAGNOSIS — D0460 Carcinoma in situ of skin of unspecified upper limb, including shoulder: Secondary | ICD-10-CM | POA: Diagnosis not present

## 2014-06-15 DIAGNOSIS — L570 Actinic keratosis: Secondary | ICD-10-CM | POA: Diagnosis not present

## 2014-06-15 DIAGNOSIS — L821 Other seborrheic keratosis: Secondary | ICD-10-CM | POA: Diagnosis not present

## 2014-06-15 DIAGNOSIS — D692 Other nonthrombocytopenic purpura: Secondary | ICD-10-CM | POA: Diagnosis not present

## 2014-06-15 DIAGNOSIS — Z85828 Personal history of other malignant neoplasm of skin: Secondary | ICD-10-CM | POA: Diagnosis not present

## 2014-06-15 DIAGNOSIS — D485 Neoplasm of uncertain behavior of skin: Secondary | ICD-10-CM | POA: Diagnosis not present

## 2014-08-31 DIAGNOSIS — Z23 Encounter for immunization: Secondary | ICD-10-CM | POA: Diagnosis not present

## 2014-09-22 DIAGNOSIS — H2513 Age-related nuclear cataract, bilateral: Secondary | ICD-10-CM | POA: Diagnosis not present

## 2014-09-22 DIAGNOSIS — H524 Presbyopia: Secondary | ICD-10-CM | POA: Diagnosis not present

## 2014-10-01 DIAGNOSIS — M542 Cervicalgia: Secondary | ICD-10-CM | POA: Diagnosis not present

## 2014-10-01 DIAGNOSIS — R319 Hematuria, unspecified: Secondary | ICD-10-CM | POA: Diagnosis not present

## 2014-10-07 ENCOUNTER — Encounter (HOSPITAL_COMMUNITY): Payer: Self-pay | Admitting: Interventional Cardiology

## 2014-10-08 DIAGNOSIS — R310 Gross hematuria: Secondary | ICD-10-CM | POA: Diagnosis not present

## 2014-10-08 DIAGNOSIS — R351 Nocturia: Secondary | ICD-10-CM | POA: Diagnosis not present

## 2014-10-11 DIAGNOSIS — N529 Male erectile dysfunction, unspecified: Secondary | ICD-10-CM | POA: Diagnosis not present

## 2014-10-11 DIAGNOSIS — I10 Essential (primary) hypertension: Secondary | ICD-10-CM | POA: Diagnosis not present

## 2014-10-11 DIAGNOSIS — E785 Hyperlipidemia, unspecified: Secondary | ICD-10-CM | POA: Diagnosis not present

## 2014-10-11 DIAGNOSIS — R739 Hyperglycemia, unspecified: Secondary | ICD-10-CM | POA: Diagnosis not present

## 2014-10-11 DIAGNOSIS — Z0000 Encounter for general adult medical examination without abnormal findings: Secondary | ICD-10-CM | POA: Diagnosis not present

## 2014-10-11 DIAGNOSIS — R319 Hematuria, unspecified: Secondary | ICD-10-CM | POA: Diagnosis not present

## 2014-10-11 DIAGNOSIS — Z23 Encounter for immunization: Secondary | ICD-10-CM | POA: Diagnosis not present

## 2014-10-11 DIAGNOSIS — R310 Gross hematuria: Secondary | ICD-10-CM | POA: Diagnosis not present

## 2014-10-11 DIAGNOSIS — I739 Peripheral vascular disease, unspecified: Secondary | ICD-10-CM | POA: Diagnosis not present

## 2014-10-11 DIAGNOSIS — I5040 Unspecified combined systolic (congestive) and diastolic (congestive) heart failure: Secondary | ICD-10-CM | POA: Diagnosis not present

## 2014-10-12 DIAGNOSIS — N200 Calculus of kidney: Secondary | ICD-10-CM | POA: Diagnosis not present

## 2014-10-12 DIAGNOSIS — R310 Gross hematuria: Secondary | ICD-10-CM | POA: Diagnosis not present

## 2014-10-12 DIAGNOSIS — K769 Liver disease, unspecified: Secondary | ICD-10-CM | POA: Diagnosis not present

## 2014-10-12 DIAGNOSIS — R312 Other microscopic hematuria: Secondary | ICD-10-CM | POA: Diagnosis not present

## 2014-10-14 DIAGNOSIS — Z85828 Personal history of other malignant neoplasm of skin: Secondary | ICD-10-CM | POA: Diagnosis not present

## 2014-10-14 DIAGNOSIS — L821 Other seborrheic keratosis: Secondary | ICD-10-CM | POA: Diagnosis not present

## 2014-10-14 DIAGNOSIS — C44519 Basal cell carcinoma of skin of other part of trunk: Secondary | ICD-10-CM | POA: Diagnosis not present

## 2014-10-14 DIAGNOSIS — C44619 Basal cell carcinoma of skin of left upper limb, including shoulder: Secondary | ICD-10-CM | POA: Diagnosis not present

## 2014-10-14 DIAGNOSIS — L570 Actinic keratosis: Secondary | ICD-10-CM | POA: Diagnosis not present

## 2014-10-14 DIAGNOSIS — D485 Neoplasm of uncertain behavior of skin: Secondary | ICD-10-CM | POA: Diagnosis not present

## 2014-10-28 DIAGNOSIS — R310 Gross hematuria: Secondary | ICD-10-CM | POA: Diagnosis not present

## 2014-11-11 ENCOUNTER — Telehealth: Payer: Self-pay | Admitting: Interventional Cardiology

## 2014-11-11 DIAGNOSIS — R310 Gross hematuria: Secondary | ICD-10-CM | POA: Diagnosis not present

## 2014-11-11 NOTE — Telephone Encounter (Signed)
New Message       Pt calling stating that his Urologist Acupuncturist McDermid) is passing a lot of blood in his urine and wants pt to see Dr. Tamala Julian as soon as possible. There are no notes in the pt's chart that he has ever seen Dr. Tamala Julian but pt states he saw him last year at our facility. Please call back and advise.

## 2014-11-12 NOTE — Telephone Encounter (Signed)
Pt reports that he had an issue of hematuria and was seen recently by his urologist. He was taking aspirin 81mg ,and plavix daily. He hurt his back in November and was taking Bayer back and body daily.he believes that the addition of the back and body aggravated the blood in his urine. His urologist has done a urine culture and other test that he is awaiting the results. Dr.Mc Dermott instructed him on Mon 1/11 to stop ASA continue plavix and to f/u wih Dr.Smith asap. He reports the  blood in his urine has cleared up since stopping aspirin. appt scheduled with Dr.Smith for 1/27 @ 4:15pm. Adv him I will fwd Dr.Smith an update. Pt is to call back if further assistance is needed. He agreed with the plan of care and verbalized understanding.

## 2014-11-17 NOTE — Telephone Encounter (Signed)
This patient has no relationship with me that I can tell. It seems that appointment needs to be changed to Dr. Irish Lack.

## 2014-11-24 ENCOUNTER — Encounter: Payer: Self-pay | Admitting: Interventional Cardiology

## 2014-11-24 ENCOUNTER — Ambulatory Visit (INDEPENDENT_AMBULATORY_CARE_PROVIDER_SITE_OTHER): Payer: Medicare Other | Admitting: Interventional Cardiology

## 2014-11-24 VITALS — BP 122/100 | HR 61 | Ht 69.0 in | Wt 147.0 lb

## 2014-11-24 DIAGNOSIS — I5042 Chronic combined systolic (congestive) and diastolic (congestive) heart failure: Secondary | ICD-10-CM

## 2014-11-24 DIAGNOSIS — Z720 Tobacco use: Secondary | ICD-10-CM

## 2014-11-24 DIAGNOSIS — I499 Cardiac arrhythmia, unspecified: Secondary | ICD-10-CM | POA: Diagnosis not present

## 2014-11-24 DIAGNOSIS — F17200 Nicotine dependence, unspecified, uncomplicated: Secondary | ICD-10-CM

## 2014-11-24 DIAGNOSIS — R319 Hematuria, unspecified: Secondary | ICD-10-CM | POA: Diagnosis not present

## 2014-11-24 DIAGNOSIS — I739 Peripheral vascular disease, unspecified: Secondary | ICD-10-CM

## 2014-11-24 DIAGNOSIS — I10 Essential (primary) hypertension: Secondary | ICD-10-CM | POA: Diagnosis not present

## 2014-11-24 NOTE — Progress Notes (Signed)
Patient ID: Nicholas Herrera, male   DOB: 15-Nov-1940, 74 y.o.   MRN: [id number] .cv    Cardiology Office Note   Date:  11/24/2014   ID:  Nicholas Herrera, DOB 1941/03/01, MRN [id number]  PCP:  Nicholas Coma, MD  Cardiologist:   Nicholas Grooms, MD     History of Present Illness: Nicholas Herrera is a 74 y.o. male who presents for cardiac opinion after developing hematuria. He has peripheral arterial disease and had prior stenting of the SFA. He has been maintained on dual antiplatelet therapy since that time by Nicholas Herrera. He denies any cardiopulmonary complaints at this time. Aspirin is been discontinued by his urologist. Hematuria has resolved off of aspirin but on Plavix. A complete evaluation including cystoscopy has been performed without significant abnormality being found. He denies any chest discomfort. This am exertional angina. He has history of depressed LV function below 35% EF that required AICD implantation in 2003. LV subsequently improved and defibrillator was deactivated. Most recent EF greater than 40%. AICD was placed in [place] prior to moving to [place]. He is not known to have coronary artery disease. He continues to smoke.    Past Medical History  Diagnosis Date  . Cough   . Abdominal pain   . Left groin pain   . Hernia october 2012    [facility]  . Abdominal distention   . Easy bruising   . Hyperlipidemia     takes zocor  . Bronchitis     hx of  . Inguinal hernia   . Chronic kidney disease     has small stones, no treatment  . Hematuria     hx of  . Diverticulosis     hx of  . Cancer     skin  . Depression     takes paxil  . Dysrhythmia   . HTN (hypertension)   . CHF (congestive heart failure)      (hx of EF 27%) s/p AICD 2004. Most recent LVEF > 40% , 8/11    Past Surgical History  Procedure Laterality Date  . Appendectomy    . Cardiac defibrillator removal  2005  . Cardiac catheterization    . Inguinal hernia repair  09/11/2011   Procedure: HERNIA REPAIR INGUINAL ADULT;  Surgeon: Judieth Keens, DO;  Location: [place];  Service: General;  Laterality: Left;  open left inguinal hernia with mesh  . Hernia repair  09/11/11    repair of LIH   . Lower extremity angiogram N/A 02/05/2013    Procedure: LOWER EXTREMITY ANGIOGRAM;  Surgeon: Jettie Booze, MD;  Location: [facility] CATH LAB;  Service: Cardiovascular;  Laterality: N/A;     Current Outpatient Prescriptions  Medication Sig Dispense Refill  . benazepril (LOTENSIN) 10 MG tablet Take 20 mg by mouth daily.    . beta carotene w/minerals (OCUVITE) tablet Take 1 tablet by mouth daily.    . carvedilol (COREG) 12.5 MG tablet Take 12.5 mg by mouth 2 (two) times daily with a meal.     . clopidogrel (PLAVIX) 75 MG tablet Take 1 tablet (75 mg total) by mouth daily with breakfast. 30 tablet 11  . doxycycline (VIBRAMYCIN) 100 MG capsule Take 100 mg by mouth 2 (two) times daily.    . Multiple Vitamins-Minerals (ONE-A-DAY MENS 50+ ADVANTAGE PO) Take 1 tablet by mouth daily.     [name] NIASPAN 1000 MG CR tablet Take 1,000 mg by mouth at bedtime.     [name] PARoxetine (  PAXIL) 20 MG tablet Take 20 mg by mouth every morning.     . simvastatin (ZOCOR) 40 MG tablet Take 40 mg by mouth at bedtime.      No current facility-administered medications for this visit.    Allergies:   Sulfur    Social History:  The patient  reports that he has been smoking Cigars.  He has never used smokeless tobacco. He reports that he drinks about 3.5 oz of alcohol per week. He reports that he does not use illicit drugs.   Family History:  The patient's  family history includes Cancer in his mother.    ROS:  Please see the history of present illness.   Otherwise, review of systems are positive for smoking and hematuria.   All other systems are reviewed and negative.    PHYSICAL EXAM: VS:  BP 122/100 mmHg  Pulse 61  Ht 5\' 9"  (1.753 m)  Wt 147 lb (66.679 kg)  BMI 21.70 kg/m2 , BMI Body mass index is 21.7  kg/(m^2). GEN: Well nourished, well developed, in no acute distress HEENT: normal Neck: no JVD, carotid bruits, or masses Cardiac: RRR; no murmurs, rubs, or gallops,no edema  Respiratory:  clear to auscultation bilaterally, normal work of breathing GI: soft, nontender, nondistended, + BS MS: no deformity or atrophy Skin: warm and dry, no rash Neuro:  Strength and sensation are intact Psych: euthymic mood, full affect   EKG:  EKG is ordered today. The ekg ordered today demonstrates sinus rhythm at 61 bpm, prominent voltage, and nonspecific ST-T wave abnormality.   Recent Labs: No results found for requested labs within last 365 days.    Lipid Panel No results found for: CHOL, TRIG, HDL, CHOLHDL, VLDL, LDLCALC, LDLDIRECT    Wt Readings from Last 3 Encounters:  11/24/14 147 lb (66.679 kg)  09/03/13 158 lb (71.668 kg)  02/05/13 150 lb (68.04 kg)      Other studies Reviewed: Additional studies/ records that were reviewed today include: Were reviewed.. Review of the above records demonstrates: Confirmed that AICD has been turned off.   ASSESSMENT AND PLAN:  1.  Peripheral arterial disease with superficial femoral artery stent. Claudication is markedly improved 2. Chronic combined systolic and diastolic heart failure, without evidence of volume overload. 3. Recent hematuria, resolved off aspirin but with continued Plavix therapy. We will plan to leave the patient off of aspirin for the foreseeable future. 4. Essential hypertension with poor pressure control. Will increase the benazepril slightly to 20 mg per day.   Current medicines are reviewed at length with the patient today.  The patient does not have concerns regarding medicines.  The following changes have been made:  Increase benazepril to 20 mg per day. Basic metabolic panel in 3-4 weeks. Blood pressure follow-up in 4-6 weeks.  Labs/ tests ordered today include:  Orders Placed This Encounter  Procedures  . Basic  Metabolic Panel (BMET)  . EKG 12-Lead     Disposition:   FU with Mallie Mussel Smith/extender in 6 weeks   Signed, Nicholas Grooms, MD  11/24/2014 5:38 PM    [address], [place], [place]  [address] Phone: [phone]; Fax: [phone]

## 2014-11-24 NOTE — Patient Instructions (Signed)
Your physician has recommended you make the following change in your medication:   Increase Benazepril 20 mg daily by mouth.  Your physician recommends that you return for lab work in:  6 weeks for BMET  Your physician recommends that you schedule a follow-up appointment in: 6 weeks with Dr. Tamala Julian

## 2014-12-28 ENCOUNTER — Other Ambulatory Visit: Payer: Medicare Other

## 2014-12-30 ENCOUNTER — Other Ambulatory Visit: Payer: Self-pay

## 2014-12-30 ENCOUNTER — Other Ambulatory Visit (INDEPENDENT_AMBULATORY_CARE_PROVIDER_SITE_OTHER): Payer: Medicare Other | Admitting: *Deleted

## 2014-12-30 DIAGNOSIS — I10 Essential (primary) hypertension: Secondary | ICD-10-CM

## 2014-12-30 LAB — BASIC METABOLIC PANEL
BUN: 11 mg/dL (ref 6–23)
CO2: 32 mEq/L (ref 19–32)
Calcium: 8.9 mg/dL (ref 8.4–10.5)
Chloride: 105 mEq/L (ref 96–112)
Creatinine, Ser: 0.92 mg/dL (ref 0.40–1.50)
GFR: 85.88 mL/min (ref >60.00)
Glucose, Bld: 105 mg/dL — ABNORMAL HIGH (ref 70–99)
Potassium: 3.9 mEq/L (ref 3.5–5.1)
Sodium: 140 mEq/L (ref 135–145)

## 2014-12-30 MED ORDER — BENAZEPRIL HCL 20 MG PO TABS: 20.0000 mg | ORAL_TABLET | Freq: Every day | ORAL | Status: DC

## 2014-12-30 NOTE — Addendum Note (Signed)
Addended by: Eulis Foster on: 12/30/2014 07:52 AM   Modules accepted: Orders

## 2014-12-30 NOTE — Telephone Encounter (Signed)
Pt came in to that office to today for lab work as requested by Dr.Smith. He request a refill for Benazepril. It was increased to 20mg  by Dr.Smith at his last o/v. Pt adv to continue that dosage until instructed otherwise. He verbalized understanding.

## 2014-12-31 ENCOUNTER — Telehealth: Payer: Self-pay

## 2014-12-31 NOTE — Telephone Encounter (Signed)
-----   Message from Sinclair Grooms, MD sent at 12/30/2014  1:42 PM EST ----- Lab test is normal

## 2014-12-31 NOTE — Telephone Encounter (Signed)
pt aware of lab results.Lab test is normal.pt verbalized understanding.

## 2014-12-31 NOTE — Telephone Encounter (Signed)
called to give pt lab results.lmtcb

## 2014-12-31 NOTE — Telephone Encounter (Signed)
F/U       Pt returning call from today.     Please return call.

## 2015-01-04 ENCOUNTER — Ambulatory Visit: Payer: Medicare Other | Admitting: Interventional Cardiology

## 2015-01-26 ENCOUNTER — Encounter: Payer: Self-pay | Admitting: Interventional Cardiology

## 2015-01-26 ENCOUNTER — Ambulatory Visit (INDEPENDENT_AMBULATORY_CARE_PROVIDER_SITE_OTHER): Payer: Medicare Other | Admitting: Interventional Cardiology

## 2015-01-26 VITALS — BP 130/86 | HR 64 | Ht 70.0 in | Wt 145.1 lb

## 2015-01-26 DIAGNOSIS — I10 Essential (primary) hypertension: Secondary | ICD-10-CM

## 2015-01-26 DIAGNOSIS — E785 Hyperlipidemia, unspecified: Secondary | ICD-10-CM | POA: Diagnosis not present

## 2015-01-26 DIAGNOSIS — I739 Peripheral vascular disease, unspecified: Secondary | ICD-10-CM | POA: Diagnosis not present

## 2015-01-26 DIAGNOSIS — I5042 Chronic combined systolic (congestive) and diastolic (congestive) heart failure: Secondary | ICD-10-CM | POA: Diagnosis not present

## 2015-01-26 NOTE — Progress Notes (Signed)
Cardiology Office Note   Date:  01/26/2015   ID:  Nicholas Herrera, DOB 1941/04/28, MRN [id number]  PCP:  Lilian Coma, MD  Cardiologist:   Sinclair Grooms, MD   Chief Complaint  Patient presents with  . Follow-up    chronic combined systolic and diastolic heart failure      History of Present Illness: Nicholas Herrera is a 74 y.o. male who presents for follow-up of hypertension in setting of coronary disease and chronic combined systolic and diastolic heart failure. He is doing well. Recent increase in benazepril therapy as cause no side effects. He denies chest discomfort and dyspnea. Prior history of heart failure with defibrillator. After recovery of LV function, defibrillator has been explanted/D activated. Recent EF 45%.    Past Medical History  Diagnosis Date  . Cough   . Abdominal pain   . Left groin pain   . Hernia october 2012    [facility]  . Abdominal distention   . Easy bruising   . Hyperlipidemia     takes zocor  . Bronchitis     hx of  . Inguinal hernia   . Chronic kidney disease     has small stones, no treatment  . Hematuria     hx of  . Diverticulosis     hx of  . Cancer     skin  . Depression     takes paxil  . Dysrhythmia   . HTN (hypertension)   . CHF (congestive heart failure)      (hx of EF 27%) s/p AICD 2004. Most recent LVEF > 40% , 8/11    Past Surgical History  Procedure Laterality Date  . Appendectomy    . Cardiac defibrillator removal  2005  . Cardiac catheterization    . Inguinal hernia repair  09/11/2011    Procedure: HERNIA REPAIR INGUINAL ADULT;  Surgeon: Judieth Keens, DO;  Location: [place];  Service: General;  Laterality: Left;  open left inguinal hernia with mesh  . Hernia repair  09/11/11    repair of LIH   . Lower extremity angiogram N/A 02/05/2013    Procedure: LOWER EXTREMITY ANGIOGRAM;  Surgeon: Jettie Booze, MD;  Location: [facility] CATH LAB;  Service: Cardiovascular;  Laterality: N/A;     Current  Outpatient Prescriptions  Medication Sig Dispense Refill  . benazepril (LOTENSIN) 20 MG tablet Take 1 tablet (20 mg total) by mouth daily. 90 tablet 1  . beta carotene w/minerals (OCUVITE) tablet Take 1 tablet by mouth daily.    . carvedilol (COREG) 12.5 MG tablet Take 12.5 mg by mouth 2 (two) times daily with a meal.     . clopidogrel (PLAVIX) 75 MG tablet Take 1 tablet (75 mg total) by mouth daily with breakfast. 30 tablet 11  . Multiple Vitamins-Minerals (ONE-A-DAY MENS 50+ ADVANTAGE PO) Take 1 tablet by mouth daily.     [name] NIASPAN 1000 MG CR tablet Take 1,000 mg by mouth at bedtime.     [name] PARoxetine (PAXIL) 20 MG tablet Take 20 mg by mouth every morning.     . simvastatin (ZOCOR) 40 MG tablet Take 40 mg by mouth at bedtime.      No current facility-administered medications for this visit.    Allergies:   Sulfur    Social History:  The patient  reports that he has been smoking Cigars.  He has never used smokeless tobacco. He reports that he drinks about 3.5 oz of alcohol per  week. He reports that he does not use illicit drugs.   Family History:  The patient's family history includes Cancer in his mother.    ROS:  Please see the history of present illness.   Otherwise, review of systems are positive for complete resolution of claudication..   All other systems are reviewed and negative.    PHYSICAL EXAM: VS:  BP 130/86 mmHg  Pulse 64  Ht 5\' 10"  (1.778 m)  Wt 145 lb 1.9 oz (65.826 kg)  BMI 20.82 kg/m2 , BMI Body mass index is 20.82 kg/(m^2). GEN: Well nourished, well developed, in no acute distress HEENT: normal Neck: no JVD, carotid bruits, or masses Cardiac: RRR; no murmurs, rubs, or gallops,no edema  Respiratory:  clear to auscultation bilaterally, normal work of breathing GI: soft, nontender, nondistended, + BS MS: no deformity or atrophy Skin: warm and dry, no rash Neuro:  Strength and sensation are intact Psych: euthymic mood, full affect   EKG:  EKG is not ordered  today.    Recent Labs: 12/30/2014: BUN 11; Creatinine 0.92; Potassium 3.9; Sodium 140    Lipid Panel No results found for: CHOL, TRIG, HDL, CHOLHDL, VLDL, LDLCALC, LDLDIRECT    Wt Readings from Last 3 Encounters:  01/26/15 145 lb 1.9 oz (65.826 kg)  11/24/14 147 lb (66.679 kg)  09/03/13 158 lb (71.668 kg)      Other studies Reviewed: Additional studies/ records that were reviewed today include: .    ASSESSMENT AND PLAN:  Chronic combined systolic and diastolic heart failure:  LV function has recovered to near normal  Essential hypertension: Excellent control  Peripheral vascular disease: Claudication resolved after therapy with PCI  Hyperlipidemia: Followed by primary care    Current medicines are reviewed at length with the patient today.  The patient does not have concerns regarding medicines.  The following changes have been made:  no change  Labs/ tests ordered today include:  No orders of the defined types were placed in this encounter.     Disposition:   FU with Linard Millers in 1 Year   Signed, Sinclair Grooms, MD  01/26/2015 8:08 AM    [address], [place], [place]  [address] Phone: [phone]; Fax: [phone]

## 2015-01-26 NOTE — Patient Instructions (Signed)
Your physician wants you to follow-up in: 12 months with Dr Gaspar Bidding will receive a reminder letter in the mail two months in advance. If you don't receive a letter, please call our office to schedule the follow-up appointment.  Your physician recommends that you continue on your current medications as directed. Please refer to the Current Medication list given to you today.  Low-Sodium Eating Plan Sodium raises blood pressure and causes water to be held in the body. Getting less sodium from food will help lower your blood pressure, reduce any swelling, and protect your heart, liver, and kidneys. We get sodium by adding salt (sodium chloride) to food. Most of our sodium comes from canned, boxed, and frozen foods. Restaurant foods, fast foods, and pizza are also very high in sodium. Even if you take medicine to lower your blood pressure or to reduce fluid in your body, getting less sodium from your food is important. WHAT IS MY PLAN? Most people should limit their sodium intake to 2,300 mg a day. Your health care provider recommends that you limit your sodium intake to 2 grams a day.  WHAT DO I NEED TO KNOW ABOUT THIS EATING PLAN? For the low-sodium eating plan, you will follow these general guidelines:  Choose foods with a % Daily Value for sodium of less than 5% (as listed on the food label).   Use salt-free seasonings or herbs instead of table salt or sea salt.   Check with your health care provider or pharmacist before using salt substitutes.   Eat fresh foods.  Eat more vegetables and fruits.  Limit canned vegetables. If you do use them, rinse them well to decrease the sodium.   Limit cheese to 1 oz (28 g) per day.   Eat lower-sodium products, often labeled as "lower sodium" or "no salt added."  Avoid foods that contain monosodium glutamate (MSG). MSG is sometimes added to [place] food and some canned foods.  Check food labels (Nutrition Facts labels) on foods to learn how  much sodium is in one serving.  Eat more home-cooked food and less restaurant, buffet, and fast food.  When eating at a restaurant, ask that your food be prepared with less salt or none, if possible.  HOW DO I READ FOOD LABELS FOR SODIUM INFORMATION? The Nutrition Facts label lists the amount of sodium in one serving of the food. If you eat more than one serving, you must multiply the listed amount of sodium by the number of servings. Food labels may also identify foods as:  Sodium free--Less than 5 mg in a serving.  Very low sodium--35 mg or less in a serving.  Low sodium--140 mg or less in a serving.  Light in sodium--50% less sodium in a serving. For example, if a food that usually has 300 mg of sodium is changed to become light in sodium, it will have 150 mg of sodium.  Reduced sodium--25% less sodium in a serving. For example, if a food that usually has 400 mg of sodium is changed to reduced sodium, it will have 300 mg of sodium. WHAT FOODS CAN I EAT? Grains Low-sodium cereals, including oats, puffed wheat and rice, and shredded wheat cereals. Low-sodium crackers. Unsalted rice and pasta. Lower-sodium bread.  Vegetables Frozen or fresh vegetables. Low-sodium or reduced-sodium canned vegetables. Low-sodium or reduced-sodium tomato sauce and paste. Low-sodium or reduced-sodium tomato and vegetable juices.  Fruits Fresh, frozen, and canned fruit. Fruit juice.  Meat and Other Protein Products Low-sodium canned tuna and  salmon. Fresh or frozen meat, poultry, seafood, and fish. Lamb. Unsalted nuts. Dried beans, peas, and lentils without added salt. Unsalted canned beans. Homemade soups without salt. Eggs.  Dairy Milk. Soy milk. Ricotta cheese. Low-sodium or reduced-sodium cheeses. Yogurt.  Condiments Fresh and dried herbs and spices. Salt-free seasonings. Onion and garlic powders. Low-sodium varieties of mustard and ketchup. Lemon juice.  Fats and Oils Reduced-sodium salad  dressings. Unsalted butter.  Other Unsalted popcorn and pretzels.  The items listed above may not be a complete list of recommended foods or beverages. Contact your dietitian for more options. WHAT FOODS ARE NOT RECOMMENDED? Grains Instant hot cereals. Bread stuffing, pancake, and biscuit mixes. Croutons. Seasoned rice or pasta mixes. Noodle soup cups. Boxed or frozen macaroni and cheese. Self-rising flour. Regular salted crackers. Vegetables Regular canned vegetables. Regular canned tomato sauce and paste. Regular tomato and vegetable juices. Frozen vegetables in sauces. Salted french fries. Olives. Angie Fava. Relishes. Sauerkraut. Salsa. Meat and Other Protein Products Salted, canned, smoked, spiced, or pickled meats, seafood, or fish. Bacon, ham, sausage, hot dogs, corned beef, chipped beef, and packaged luncheon meats. Salt pork. Jerky. Pickled herring. Anchovies, regular canned tuna, and sardines. Salted nuts. Dairy Processed cheese and cheese spreads. Cheese curds. Blue cheese and cottage cheese. Buttermilk.  Condiments Onion and garlic salt, seasoned salt, table salt, and sea salt. Canned and packaged gravies. Worcestershire sauce. Tartar sauce. Barbecue sauce. Teriyaki sauce. Soy sauce, including reduced sodium. Steak sauce. Fish sauce. Oyster sauce. Cocktail sauce. Horseradish. Regular ketchup and mustard. Meat flavorings and tenderizers. Bouillon cubes. Hot sauce. Tabasco sauce. Marinades. Taco seasonings. Relishes. Fats and Oils Regular salad dressings. Salted butter. Margarine. Ghee. Bacon fat.  Other Potato and tortilla chips. Corn chips and puffs. Salted popcorn and pretzels. Canned or dried soups. Pizza. Frozen entrees and pot pies.  The items listed above may not be a complete list of foods and beverages to avoid. Contact your dietitian for more information. Document Released: 04/06/2002 Document Revised: 10/20/2013 Document Reviewed: 08/19/2013 [facility] Patient  Information 2015 [place], [place]. This information is not intended to replace advice given to you by your health care provider. Make sure you discuss any questions you have with your health care provider.

## 2015-04-18 DIAGNOSIS — D0439 Carcinoma in situ of skin of other parts of face: Secondary | ICD-10-CM | POA: Diagnosis not present

## 2015-04-18 DIAGNOSIS — D485 Neoplasm of uncertain behavior of skin: Secondary | ICD-10-CM | POA: Diagnosis not present

## 2015-04-18 DIAGNOSIS — L723 Sebaceous cyst: Secondary | ICD-10-CM | POA: Diagnosis not present

## 2015-04-18 DIAGNOSIS — L570 Actinic keratosis: Secondary | ICD-10-CM | POA: Diagnosis not present

## 2015-04-18 DIAGNOSIS — Z85828 Personal history of other malignant neoplasm of skin: Secondary | ICD-10-CM | POA: Diagnosis not present

## 2015-04-18 DIAGNOSIS — C4441 Basal cell carcinoma of skin of scalp and neck: Secondary | ICD-10-CM | POA: Diagnosis not present

## 2015-04-18 DIAGNOSIS — L821 Other seborrheic keratosis: Secondary | ICD-10-CM | POA: Diagnosis not present

## 2015-04-18 DIAGNOSIS — D0421 Carcinoma in situ of skin of right ear and external auricular canal: Secondary | ICD-10-CM | POA: Diagnosis not present

## 2015-05-05 DIAGNOSIS — R351 Nocturia: Secondary | ICD-10-CM | POA: Diagnosis not present

## 2015-05-05 DIAGNOSIS — R310 Gross hematuria: Secondary | ICD-10-CM | POA: Diagnosis not present

## 2015-05-20 DIAGNOSIS — R197 Diarrhea, unspecified: Secondary | ICD-10-CM | POA: Diagnosis not present

## 2015-05-20 DIAGNOSIS — D692 Other nonthrombocytopenic purpura: Secondary | ICD-10-CM | POA: Diagnosis not present

## 2015-05-20 DIAGNOSIS — F325 Major depressive disorder, single episode, in full remission: Secondary | ICD-10-CM | POA: Diagnosis not present

## 2015-05-24 DIAGNOSIS — R197 Diarrhea, unspecified: Secondary | ICD-10-CM | POA: Diagnosis not present

## 2015-06-10 DIAGNOSIS — F325 Major depressive disorder, single episode, in full remission: Secondary | ICD-10-CM | POA: Diagnosis not present

## 2015-08-23 DIAGNOSIS — Z23 Encounter for immunization: Secondary | ICD-10-CM | POA: Diagnosis not present

## 2015-10-04 DIAGNOSIS — H43813 Vitreous degeneration, bilateral: Secondary | ICD-10-CM | POA: Diagnosis not present

## 2015-10-04 DIAGNOSIS — H25813 Combined forms of age-related cataract, bilateral: Secondary | ICD-10-CM | POA: Diagnosis not present

## 2015-10-04 DIAGNOSIS — H524 Presbyopia: Secondary | ICD-10-CM | POA: Diagnosis not present

## 2015-10-14 DIAGNOSIS — N323 Diverticulum of bladder: Secondary | ICD-10-CM | POA: Diagnosis not present

## 2015-10-14 DIAGNOSIS — Z0000 Encounter for general adult medical examination without abnormal findings: Secondary | ICD-10-CM | POA: Diagnosis not present

## 2015-10-14 DIAGNOSIS — I5040 Unspecified combined systolic (congestive) and diastolic (congestive) heart failure: Secondary | ICD-10-CM | POA: Diagnosis not present

## 2015-10-14 DIAGNOSIS — Z950 Presence of cardiac pacemaker: Secondary | ICD-10-CM | POA: Diagnosis not present

## 2015-10-14 DIAGNOSIS — F325 Major depressive disorder, single episode, in full remission: Secondary | ICD-10-CM | POA: Diagnosis not present

## 2015-10-14 DIAGNOSIS — R739 Hyperglycemia, unspecified: Secondary | ICD-10-CM | POA: Diagnosis not present

## 2015-10-14 DIAGNOSIS — I10 Essential (primary) hypertension: Secondary | ICD-10-CM | POA: Diagnosis not present

## 2015-10-14 DIAGNOSIS — I739 Peripheral vascular disease, unspecified: Secondary | ICD-10-CM | POA: Diagnosis not present

## 2015-10-14 DIAGNOSIS — K5730 Diverticulosis of large intestine without perforation or abscess without bleeding: Secondary | ICD-10-CM | POA: Diagnosis not present

## 2015-10-14 DIAGNOSIS — I2510 Atherosclerotic heart disease of native coronary artery without angina pectoris: Secondary | ICD-10-CM | POA: Diagnosis not present

## 2015-10-14 DIAGNOSIS — E785 Hyperlipidemia, unspecified: Secondary | ICD-10-CM | POA: Diagnosis not present

## 2015-10-14 DIAGNOSIS — N200 Calculus of kidney: Secondary | ICD-10-CM | POA: Diagnosis not present

## 2015-10-20 DIAGNOSIS — L821 Other seborrheic keratosis: Secondary | ICD-10-CM | POA: Diagnosis not present

## 2015-10-20 DIAGNOSIS — D1801 Hemangioma of skin and subcutaneous tissue: Secondary | ICD-10-CM | POA: Diagnosis not present

## 2015-10-20 DIAGNOSIS — C4442 Squamous cell carcinoma of skin of scalp and neck: Secondary | ICD-10-CM | POA: Diagnosis not present

## 2015-10-20 DIAGNOSIS — L570 Actinic keratosis: Secondary | ICD-10-CM | POA: Diagnosis not present

## 2015-10-20 DIAGNOSIS — D485 Neoplasm of uncertain behavior of skin: Secondary | ICD-10-CM | POA: Diagnosis not present

## 2015-10-20 DIAGNOSIS — Z85828 Personal history of other malignant neoplasm of skin: Secondary | ICD-10-CM | POA: Diagnosis not present

## 2015-11-24 DIAGNOSIS — D045 Carcinoma in situ of skin of trunk: Secondary | ICD-10-CM | POA: Diagnosis not present

## 2015-11-24 DIAGNOSIS — Z85828 Personal history of other malignant neoplasm of skin: Secondary | ICD-10-CM | POA: Diagnosis not present

## 2015-11-24 DIAGNOSIS — C44222 Squamous cell carcinoma of skin of right ear and external auricular canal: Secondary | ICD-10-CM | POA: Diagnosis not present

## 2015-11-24 DIAGNOSIS — L570 Actinic keratosis: Secondary | ICD-10-CM | POA: Diagnosis not present

## 2015-11-24 DIAGNOSIS — D485 Neoplasm of uncertain behavior of skin: Secondary | ICD-10-CM | POA: Diagnosis not present

## 2015-11-24 DIAGNOSIS — C44529 Squamous cell carcinoma of skin of other part of trunk: Secondary | ICD-10-CM | POA: Diagnosis not present

## 2016-01-23 DIAGNOSIS — Z85828 Personal history of other malignant neoplasm of skin: Secondary | ICD-10-CM | POA: Diagnosis not present

## 2016-01-23 DIAGNOSIS — C44319 Basal cell carcinoma of skin of other parts of face: Secondary | ICD-10-CM | POA: Diagnosis not present

## 2016-01-23 DIAGNOSIS — D485 Neoplasm of uncertain behavior of skin: Secondary | ICD-10-CM | POA: Diagnosis not present

## 2016-01-23 DIAGNOSIS — C44329 Squamous cell carcinoma of skin of other parts of face: Secondary | ICD-10-CM | POA: Diagnosis not present

## 2016-01-23 DIAGNOSIS — L570 Actinic keratosis: Secondary | ICD-10-CM | POA: Diagnosis not present

## 2016-02-06 DIAGNOSIS — Z0000 Encounter for general adult medical examination without abnormal findings: Secondary | ICD-10-CM | POA: Diagnosis not present

## 2016-02-06 DIAGNOSIS — R310 Gross hematuria: Secondary | ICD-10-CM | POA: Diagnosis not present

## 2016-02-06 DIAGNOSIS — R351 Nocturia: Secondary | ICD-10-CM | POA: Diagnosis not present

## 2016-02-29 DIAGNOSIS — R351 Nocturia: Secondary | ICD-10-CM | POA: Diagnosis not present

## 2016-02-29 DIAGNOSIS — Z0000 Encounter for general adult medical examination without abnormal findings: Secondary | ICD-10-CM | POA: Diagnosis not present

## 2016-02-29 DIAGNOSIS — R310 Gross hematuria: Secondary | ICD-10-CM | POA: Diagnosis not present

## 2016-02-29 DIAGNOSIS — R3915 Urgency of urination: Secondary | ICD-10-CM | POA: Diagnosis not present

## 2016-04-19 DIAGNOSIS — D1801 Hemangioma of skin and subcutaneous tissue: Secondary | ICD-10-CM | POA: Diagnosis not present

## 2016-04-19 DIAGNOSIS — C4442 Squamous cell carcinoma of skin of scalp and neck: Secondary | ICD-10-CM | POA: Diagnosis not present

## 2016-04-19 DIAGNOSIS — L570 Actinic keratosis: Secondary | ICD-10-CM | POA: Diagnosis not present

## 2016-04-19 DIAGNOSIS — C44319 Basal cell carcinoma of skin of other parts of face: Secondary | ICD-10-CM | POA: Diagnosis not present

## 2016-04-19 DIAGNOSIS — C44219 Basal cell carcinoma of skin of left ear and external auricular canal: Secondary | ICD-10-CM | POA: Diagnosis not present

## 2016-04-19 DIAGNOSIS — Z85828 Personal history of other malignant neoplasm of skin: Secondary | ICD-10-CM | POA: Diagnosis not present

## 2016-04-19 DIAGNOSIS — L821 Other seborrheic keratosis: Secondary | ICD-10-CM | POA: Diagnosis not present

## 2016-04-19 DIAGNOSIS — D485 Neoplasm of uncertain behavior of skin: Secondary | ICD-10-CM | POA: Diagnosis not present

## 2016-05-03 DIAGNOSIS — R3915 Urgency of urination: Secondary | ICD-10-CM | POA: Diagnosis not present

## 2016-05-03 DIAGNOSIS — R351 Nocturia: Secondary | ICD-10-CM | POA: Diagnosis not present

## 2016-05-24 ENCOUNTER — Encounter: Payer: Self-pay | Admitting: Interventional Cardiology

## 2016-05-31 DIAGNOSIS — Z85828 Personal history of other malignant neoplasm of skin: Secondary | ICD-10-CM | POA: Diagnosis not present

## 2016-05-31 DIAGNOSIS — C44219 Basal cell carcinoma of skin of left ear and external auricular canal: Secondary | ICD-10-CM | POA: Diagnosis not present

## 2016-06-12 ENCOUNTER — Ambulatory Visit (INDEPENDENT_AMBULATORY_CARE_PROVIDER_SITE_OTHER): Payer: Medicare Other | Admitting: Interventional Cardiology

## 2016-06-12 ENCOUNTER — Encounter: Payer: Self-pay | Admitting: Interventional Cardiology

## 2016-06-12 ENCOUNTER — Encounter (INDEPENDENT_AMBULATORY_CARE_PROVIDER_SITE_OTHER): Payer: Self-pay

## 2016-06-12 VITALS — BP 150/90 | HR 67 | Ht 70.0 in | Wt 151.1 lb

## 2016-06-12 DIAGNOSIS — I739 Peripheral vascular disease, unspecified: Secondary | ICD-10-CM

## 2016-06-12 DIAGNOSIS — F17200 Nicotine dependence, unspecified, uncomplicated: Secondary | ICD-10-CM | POA: Diagnosis not present

## 2016-06-12 DIAGNOSIS — I10 Essential (primary) hypertension: Secondary | ICD-10-CM

## 2016-06-12 DIAGNOSIS — E785 Hyperlipidemia, unspecified: Secondary | ICD-10-CM

## 2016-06-12 DIAGNOSIS — I5042 Chronic combined systolic (congestive) and diastolic (congestive) heart failure: Secondary | ICD-10-CM

## 2016-06-12 MED ORDER — CARVEDILOL 12.5 MG PO TABS: 18.7500 mg | ORAL_TABLET | Freq: Two times a day (BID) | ORAL | 3 refills | Status: DC

## 2016-06-12 NOTE — Patient Instructions (Addendum)
Medication Instructions:  Your physician has recommended you make the following change in your medication:  INCREASE Carvedilol to 18.75 (1 and 1/2 tablet) twice daily  Labwork: None ordered  Testing/Procedures: Your physician has requested that you have a lower extremity arterial exercise duplex. During this test, exercise and ultrasound are used to evaluate arterial blood flow in the legs. Allow one hour for this exam. There are no restrictions or special instructions.   Follow-Up: Your physician wants you to follow-up in: 1 year with Dr.Smith You will receive a reminder letter in the mail two months in advance. If you don't receive a letter, please call our office to schedule the follow-up appointment.   Any Other Special Instructions Will Be Listed Below (If Applicable).     If you need a refill on your cardiac medications before your next appointment, please call your pharmacy.

## 2016-06-12 NOTE — Progress Notes (Signed)
Cardiology Office Note    Date:  06/12/2016   ID:  ISSAAC Herrera, DOB [birth year], MRN [id number]  PCP:  Lilian Coma, MD  Cardiologist: Sinclair Grooms, MD   Chief Complaint  Patient presents with  . Congestive Heart Failure    History of Present Illness:  Nicholas Herrera is a 75 y.o. male follow-up of hypertension in setting of coronary disease and chronic combined systolic and diastolic heart failure.  Nicholas Herrera is been having difficulty with explosive bowel movements and urinary incontinence/urgency. There is no chest pain. There is no orthopnea or PND. He has difficulty with pain in his arches and calves with repetitive or continuous ambulation. He denies palpitations. He has not had syncope. He denies orthopnea. Niacin was discontinued because it was felt to be contributing to the gastrointestinal complaints. Aspirin was discontinued because it was causing hematuria.  Past Medical History:  Diagnosis Date  . Abdominal distention   . Abdominal pain   . Bronchitis    hx of  . Cancer ([place])    skin  . CHF (congestive heart failure) (HCC)     (hx of EF 27%) s/p AICD 2004. Most recent LVEF > 40% , 8/11  . Chronic kidney disease    has small stones, no treatment  . Cough   . Depression    takes paxil  . Diverticulosis    hx of  . Dysrhythmia   . Easy bruising   . Hematuria    hx of  . Hernia october 2012   [facility]  . HTN (hypertension)   . Hyperlipidemia    takes zocor  . Inguinal hernia   . Left groin pain     Past Surgical History:  Procedure Laterality Date  . APPENDECTOMY    . CARDIAC CATHETERIZATION    . CARDIAC DEFIBRILLATOR REMOVAL  2005  . HERNIA REPAIR  09/11/11   repair of LIH   . INGUINAL HERNIA REPAIR  09/11/2011   Procedure: HERNIA REPAIR INGUINAL ADULT;  Surgeon: Judieth Keens, DO;  Location: [facility] [facility];  Service: General;  Laterality: Left;  open left inguinal hernia with mesh  . LOWER EXTREMITY ANGIOGRAM N/A 02/05/2013   Procedure: LOWER  EXTREMITY ANGIOGRAM;  Surgeon: Jettie Booze, MD;  [facility] CATH LAB;  Service: Cardiovascular;  Laterality: N/A;    Current Medications: Outpatient Medications Prior to Visit  Medication Sig Dispense Refill  . benazepril (LOTENSIN) 20 MG tablet Take 1 tablet (20 mg total) by mouth daily. 90 tablet 1  . beta carotene w/minerals (OCUVITE) tablet Take 1 tablet by mouth daily.    . carvedilol (COREG) 12.5 MG tablet Take 12.5 mg by mouth 2 (two) times daily with a meal.     . clopidogrel (PLAVIX) 75 MG tablet Take 1 tablet (75 mg total) by mouth daily with breakfast. 30 tablet 11  . Multiple Vitamins-Minerals (ONE-A-DAY MENS 50+ ADVANTAGE PO) Take 1 tablet by mouth daily.     [name] PARoxetine (PAXIL) 20 MG tablet Take 20 mg by mouth every morning.     . simvastatin (ZOCOR) 40 MG tablet Take 40 mg by mouth at bedtime.     [name] NIASPAN 1000 MG CR tablet Take 1,000 mg by mouth at bedtime.      No facility-administered medications prior to visit.      Allergies:   Sulfur   Social History   Social History  . Marital status: Married    Spouse name: N/A  . Number of  children: N/A  . Years of education: N/A   Social History Main Topics  . Smoking status: Current Every Day Smoker    Packs/day: 1.00    Years: 48.00    Types: Cigars  . Smokeless tobacco: Never Used  . Alcohol use 3.5 oz/week    7 drink(s) per week  . Drug use: No  . Sexual activity: Not Asked   Other Topics Concern  . None   Social History Narrative  . None     Family History:  The patient's family history includes Cancer in his mother.   ROS:   Please see the history of present illness.    Hematuria and leg pain bilaterally  All other systems reviewed and are negative.   PHYSICAL EXAM:   VS:  BP (!) 150/90   Pulse 67   Ht 5\' 10"  (1.778 m)   Wt 151 lb 1.9 oz (68.5 kg)   BMI 21.68 kg/m    GEN: Well nourished, well developed, in no acute distress  HEENT: normal  Neck: no JVD, carotid bruits, or  masses Cardiac: RRR; no murmurs, rubs, or gallops,no edema  Respiratory:  clear to auscultation bilaterally, normal work of breathing GI: soft, nontender, nondistended, + BS MS: no deformity or atrophy  Skin: warm and dry, no rash Neuro:  Alert and Oriented x 3, Strength and sensation are intact Psych: euthymic mood, full affect  Wt Readings from Last 3 Encounters:  06/12/16 151 lb 1.9 oz (68.5 kg)  01/26/15 145 lb 1.9 oz (65.8 kg)  11/24/14 147 lb (66.7 kg)      Studies/Labs Reviewed:   EKG:  EKG  Normal sinus rhythm with nonspecific T-wave abnormality.  Recent Labs: No results found for requested labs within last 8760 hours.   Lipid Panel No results found for: CHOL, TRIG, HDL, CHOLHDL, VLDL, LDLCALC, LDLDIRECT  Additional studies/ records that were reviewed today include:  No new data    ASSESSMENT:    1. Chronic combined systolic and diastolic heart failure ([place])   2. Essential hypertension   3. Peripheral vascular disease ([place])   4. Tobacco use disorder   5. Hyperlipidemia      PLAN:  In order of problems listed above:  1. No evidence of volume overload. Because of elevated blood pressure with increasing carbonate all to 18.75 mg twice a day. 2. Increase carvedilol to 18.75 mg twice a day. 3. Bilateral lower extremity Doppler study to evaluate increasing foot and calf discomfort with physical activity right leg greater than left. 4. Smoking cessation 5. Lipids are not as good as before because niacin is been discontinued because of intestinal issues. This is being managed by Dr. Cheron Schaumann.    Medication Adjustments/Labs and Tests Ordered: Current medicines are reviewed at length with the patient today.  Concerns regarding medicines are outlined above.  Medication changes, Labs and Tests ordered today are listed in the Patient Instructions below. There are no Patient Instructions on file for this visit.   Signed, Sinclair Grooms, MD  06/12/2016 4:18 PM     [facility] [place], [place], [place]  [address] Phone: [phone]; Fax: [phone]

## 2016-06-14 ENCOUNTER — Other Ambulatory Visit: Payer: Self-pay | Admitting: Interventional Cardiology

## 2016-06-14 DIAGNOSIS — I739 Peripheral vascular disease, unspecified: Secondary | ICD-10-CM

## 2016-06-14 DIAGNOSIS — Z4802 Encounter for removal of sutures: Secondary | ICD-10-CM | POA: Diagnosis not present

## 2016-06-15 ENCOUNTER — Ambulatory Visit (HOSPITAL_COMMUNITY)
Admission: RE | Admit: 2016-06-15 | Discharge: 2016-06-15 | Disposition: A | Discharge status: Home / Self Care | Payer: Medicare Other | Source: Ambulatory Visit | Attending: Interventional Cardiology | Admitting: Interventional Cardiology

## 2016-06-15 DIAGNOSIS — I771 Stricture of artery: Secondary | ICD-10-CM | POA: Diagnosis not present

## 2016-06-15 DIAGNOSIS — I739 Peripheral vascular disease, unspecified: Secondary | ICD-10-CM | POA: Diagnosis not present

## 2016-06-15 DIAGNOSIS — I10 Essential (primary) hypertension: Secondary | ICD-10-CM | POA: Insufficient documentation

## 2016-06-15 DIAGNOSIS — I2510 Atherosclerotic heart disease of native coronary artery without angina pectoris: Secondary | ICD-10-CM | POA: Diagnosis not present

## 2016-06-15 DIAGNOSIS — E785 Hyperlipidemia, unspecified: Secondary | ICD-10-CM | POA: Insufficient documentation

## 2016-06-20 ENCOUNTER — Telehealth: Payer: Self-pay | Admitting: Interventional Cardiology

## 2016-06-20 NOTE — Telephone Encounter (Signed)
New message       Pt calling to get results of his doppler test. Please call.

## 2016-06-20 NOTE — Telephone Encounter (Signed)
-----   Message from [name], MD sent at 06/16/2016  1:14 PM EDT ----- Let the patient know abnormal blood flow s abnormal on the right. Duplex study not yet available for review. May need to see a PV specialist if dulex suggests. A copy will be sent to Lilian Coma, MD

## 2016-06-20 NOTE — Telephone Encounter (Signed)
Pt aware of ABI results and adv that we are awaiting the results of the duplex. Adv pt that we will call back when those results become available. Pt verbalized understanding.

## 2016-06-25 ENCOUNTER — Telehealth: Payer: Self-pay | Admitting: Interventional Cardiology

## 2016-06-25 ENCOUNTER — Telehealth: Payer: Self-pay

## 2016-06-25 NOTE — Telephone Encounter (Signed)
Spoke with pt pt aware of Dr.Smith's recommendation for him to have a PV consult with Dr.Arida or Dr.Berry. Pt is agreeable, adv pt that I will fwd a message to our scheduling dept to call him to schedule. Pt verbalized understanding and voiced appreciation for the call.

## 2016-06-25 NOTE — Telephone Encounter (Signed)
Nicholas Herrera is calling because he received a email bout his test results from [facility] , telling him to set up an appointment w/ Dr. Gwenlyn Found or Dr. Fletcher Anon . Please call  Thanks

## 2016-06-25 NOTE — Telephone Encounter (Signed)
-----   Message from [name], MD sent at 06/24/2016  7:54 PM EDT ----- Occluded stent in right leg. Needs appointment with Dr. Fletcher Anon or Gwenlyn Found.

## 2016-07-03 ENCOUNTER — Ambulatory Visit (INDEPENDENT_AMBULATORY_CARE_PROVIDER_SITE_OTHER): Payer: Medicare Other | Admitting: Cardiovascular Disease

## 2016-07-03 VITALS — BP 129/79 | HR 62 | Ht 70.0 in | Wt 151.4 lb

## 2016-07-03 DIAGNOSIS — Z720 Tobacco use: Secondary | ICD-10-CM

## 2016-07-03 DIAGNOSIS — I10 Essential (primary) hypertension: Secondary | ICD-10-CM | POA: Diagnosis not present

## 2016-07-03 DIAGNOSIS — E785 Hyperlipidemia, unspecified: Secondary | ICD-10-CM

## 2016-07-03 DIAGNOSIS — I739 Peripheral vascular disease, unspecified: Secondary | ICD-10-CM | POA: Diagnosis not present

## 2016-07-03 NOTE — Progress Notes (Signed)
Cardiology Office Note   Date:  07/03/2016   ID:  Nicholas Herrera, DOB February 19, 1941, MRN [id number]  PCP:  Lilian Coma, MD  Cardiologist:  Dr. Tamala Julian  Chief Complaint  Patient presents with  . New Evaluation    pt stated he did a doppler and stent in right leg is clog up      History of Present Illness: Nicholas Herrera is a 75 y.o. male who was referred by Dr. Tamala Julian for management of PAD.  He has known history of coronary artery disease, chronic systolic/diastolic heart failure and hypertension. He has known history of peripheral arterial disease with previous revascularization of CT of the right proximal popliteal artery using a 6 x 100 mm self-expanding stent in 2014. It was done at that time for severe claudication. No other previous endovascular intervention. He started having recurrent right calf claudication 6-12 months ago and this has been stable. He underwent recent noninvasive vascular evaluation which showed an ABI of 0.72 on the right and 1.0 on the left. Duplex showed occlusion of the right popliteal artery stent with 2 vessel runoff below the knee. He reports right calf discomfort if he overexerts himself. However, he is able to do all the basic functioning with no significant limitations. He is able to walk on a flat level for a long distance without having to stop. There is no rest pain or lower extremity ulceration. He is not diabetic but he continues to smoke one pack per day.   Past Medical History:  Diagnosis Date  . Abdominal distention   . Abdominal pain   . Bronchitis    hx of  . Cancer ([place])    skin  . CHF (congestive heart failure) (HCC)     (hx of EF 27%) s/p AICD 2004. Most recent LVEF > 40% , 8/11  . Chronic kidney disease    has small stones, no treatment  . Cough   . Depression    takes paxil  . Diverticulosis    hx of  . Dysrhythmia   . Easy bruising   . Hematuria    hx of  . Hernia october 2012   [facility]  . HTN (hypertension)   .  Hyperlipidemia    takes zocor  . Inguinal hernia   . Left groin pain     Past Surgical History:  Procedure Laterality Date  . APPENDECTOMY    . CARDIAC CATHETERIZATION    . CARDIAC DEFIBRILLATOR REMOVAL  2005  . HERNIA REPAIR  09/11/11   repair of LIH   . INGUINAL HERNIA REPAIR  09/11/2011   Procedure: HERNIA REPAIR INGUINAL ADULT;  Surgeon: Judieth Keens, DO;  Location: [place];  Service: General;  Laterality: Left;  open left inguinal hernia with mesh  . LOWER EXTREMITY ANGIOGRAM N/A 02/05/2013   Procedure: LOWER EXTREMITY ANGIOGRAM;  Surgeon: Jettie Booze, MD;  Location: [facility] CATH LAB;  Service: Cardiovascular;  Laterality: N/A;     Current Outpatient Prescriptions  Medication Sig Dispense Refill  . benazepril (LOTENSIN) 20 MG tablet Take 1 tablet (20 mg total) by mouth daily. 90 tablet 1  . carvedilol (COREG) 12.5 MG tablet Take 1.5 tablets (18.75 mg total) by mouth 2 (two) times daily with a meal. 270 tablet 3  . clopidogrel (PLAVIX) 75 MG tablet Take 1 tablet (75 mg total) by mouth daily with breakfast. 30 tablet 11  . mirabegron ER (MYRBETRIQ) 50 MG TB24 tablet Take 50 mg by mouth daily.    [name]  Multiple Vitamins-Minerals (ONE-A-DAY MENS 50+ ADVANTAGE PO) Take 1 tablet by mouth daily.     [name] PARoxetine (PAXIL) 20 MG tablet Take 20 mg by mouth every morning.     . simvastatin (ZOCOR) 40 MG tablet Take 40 mg by mouth at bedtime.      No current facility-administered medications for this visit.     Allergies:   Sulfur    Social History:  The patient  reports that he has been smoking Cigars.  He has a 48.00 pack-year smoking history. He has never used smokeless tobacco. He reports that he drinks about 3.5 oz of alcohol per week . He reports that he does not use drugs.   Family History:  The patient's family history includes Cancer in his mother.    ROS:  Please see the history of present illness.   Otherwise, review of systems are positive for none.   All other systems  are reviewed and negative.    PHYSICAL EXAM: VS:  BP 129/79   Pulse 62   Ht 5\' 10"  (1.778 m)   Wt 151 lb 6.4 oz (68.7 kg)   BMI 21.72 kg/m  , BMI Body mass index is 21.72 kg/m. GEN: Well nourished, well developed, in no acute distress  HEENT: normal  Neck: no JVD, carotid bruits, or masses Cardiac: RRR; no murmurs, rubs, or gallops,no edema  Respiratory:  clear to auscultation bilaterally, normal work of breathing GI: soft, nontender, nondistended, + BS MS: no deformity or atrophy  Skin: warm and dry, no rash Neuro:  Strength and sensation are intact Psych: euthymic mood, full affect   EKG:  EKG is not ordered today.    Recent Labs: No results found for requested labs within last 8760 hours.    Lipid Panel No results found for: CHOL, TRIG, HDL, CHOLHDL, VLDL, LDLCALC, LDLDIRECT    Wt Readings from Last 3 Encounters:  07/03/16 151 lb 6.4 oz (68.7 kg)  06/12/16 151 lb 1.9 oz (68.5 kg)  01/26/15 145 lb 1.9 oz (65.8 kg)       No flowsheet data found.    ASSESSMENT AND PLAN:  1.  Peripheral arterial disease: The patient has moderate right calf claudication due to an occluded right popliteal artery stent. His symptoms currently not lifestyle limiting. I discussed with him management options and prognosis of claudication. We discussed the option of proceeding with endovascular intervention versus attempting a walking program. He is going to start a walking program on a daily basis and we will reevaluate symptoms in 6 months. Cilostazol is contraindicated due to history of heart failure.   2. Tobacco use: I had a prolonged discussion with him about the association of peripheral arterial disease and tobacco use. I strongly advised smoking cessation.  3. Hyperlipidemia: Content on simvastatin.  4. Coronary artery disease: No anginal symptoms.   5. Chronic systolic heart failure: Stable on medications.    Disposition:   FU with me in 6 months  Signed,  Kathlyn Sacramento, MD  07/03/2016 8:08 AM    [place]

## 2016-07-03 NOTE — Patient Instructions (Signed)
Medication Instructions:  Your physician recommends that you continue on your current medications as directed. Please refer to the Current Medication list given to you today.  Labwork: none  Testing/Procedures: none  Follow-Up: Your physician wants you to follow-up in: 6 month ov You will receive a reminder letter in the mail two months in advance. If you don't receive a letter, please call our office to schedule the follow-up appointment.  Smoking Cessation, Tips for Success If you are ready to quit smoking, congratulations! You have chosen to help yourself be healthier. Cigarettes bring nicotine, tar, carbon monoxide, and other irritants into your body. Your lungs, heart, and blood vessels will be able to work better without these poisons. There are many different ways to quit smoking. Nicotine gum, nicotine patches, a nicotine inhaler, or nicotine nasal spray can help with physical craving. Hypnosis, support groups, and medicines help break the habit of smoking. WHAT THINGS CAN I DO TO MAKE QUITTING EASIER?  Here are some tips to help you quit for good:  Pick a date when you will quit smoking completely. Tell all of your friends and family about your plan to quit on that date.  Do not try to slowly cut down on the number of cigarettes you are smoking. Pick a quit date and quit smoking completely starting on that day.  Throw away all cigarettes.   Clean and remove all ashtrays from your home, work, and car.  On a card, write down your reasons for quitting. Carry the card with you and read it when you get the urge to smoke.  Cleanse your body of nicotine. Drink enough water and fluids to keep your urine clear or pale yellow. Do this after quitting to flush the nicotine from your body.  Learn to predict your moods. Do not let a bad situation be your excuse to have a cigarette. Some situations in your life might tempt you into wanting a cigarette.  Never have "just one" cigarette. It  leads to wanting another and another. Remind yourself of your decision to quit.  Change habits associated with smoking. If you smoked while driving or when feeling stressed, try other activities to replace smoking. Stand up when drinking your coffee. Brush your teeth after eating. Sit in a different chair when you read the paper. Avoid alcohol while trying to quit, and try to drink fewer caffeinated beverages. Alcohol and caffeine may urge you to smoke.  Avoid foods and drinks that can trigger a desire to smoke, such as sugary or spicy foods and alcohol.  Ask people who smoke not to smoke around you.  Have something planned to do right after eating or having a cup of coffee. For example, plan to take a walk or exercise.  Try a relaxation exercise to calm you down and decrease your stress. Remember, you may be tense and nervous for the first 2 weeks after you quit, but this will pass.  Find new activities to keep your hands busy. Play with a pen, coin, or rubber band. Doodle or draw things on paper.  Brush your teeth right after eating. This will help cut down on the craving for the taste of tobacco after meals. You can also try mouthwash.   Use oral substitutes in place of cigarettes. Try using lemon drops, carrots, cinnamon sticks, or chewing gum. Keep them handy so they are available when you have the urge to smoke.  When you have the urge to smoke, try deep breathing.  Designate your  home as a nonsmoking area.  If you are a heavy smoker, ask your health care provider about a prescription for nicotine chewing gum. It can ease your withdrawal from nicotine.  Reward yourself. Set aside the cigarette money you save and buy yourself something nice.  Look for support from others. Join a support group or smoking cessation program. Ask someone at home or at work to help you with your plan to quit smoking.  Always ask yourself, "Do I need this cigarette or is this just a reflex?" Tell  yourself, "Today, I choose not to smoke," or "I do not want to smoke." You are reminding yourself of your decision to quit.  Do not replace cigarette smoking with electronic cigarettes (commonly called e-cigarettes). The safety of e-cigarettes is unknown, and some may contain harmful chemicals.  If you relapse, do not give up! Plan ahead and think about what you will do the next time you get the urge to smoke. HOW WILL I FEEL WHEN I QUIT SMOKING? You may have symptoms of withdrawal because your body is used to nicotine (the addictive substance in cigarettes). You may crave cigarettes, be irritable, feel very hungry, cough often, get headaches, or have difficulty concentrating. The withdrawal symptoms are only temporary. They are strongest when you first quit but will go away within 10-14 days. When withdrawal symptoms occur, stay in control. Think about your reasons for quitting. Remind yourself that these are signs that your body is healing and getting used to being without cigarettes. Remember that withdrawal symptoms are easier to treat than the major diseases that smoking can cause.  Even after the withdrawal is over, expect periodic urges to smoke. However, these cravings are generally short lived and will go away whether you smoke or not. Do not smoke! WHAT RESOURCES ARE AVAILABLE TO HELP ME QUIT SMOKING? Your health care provider can direct you to community resources or hospitals for support, which may include:  Group support.  Education.  Hypnosis.  Therapy.   This information is not intended to replace advice given to you by your health care provider. Make sure you discuss any questions you have with your health care provider.   Document Released: 07/13/2004 Document Revised: 11/05/2014 Document Reviewed: 04/02/2013 Elsevier Interactive Patient Education [employer].

## 2016-07-18 DIAGNOSIS — L570 Actinic keratosis: Secondary | ICD-10-CM | POA: Diagnosis not present

## 2016-07-18 DIAGNOSIS — Z85828 Personal history of other malignant neoplasm of skin: Secondary | ICD-10-CM | POA: Diagnosis not present

## 2016-07-18 DIAGNOSIS — C44519 Basal cell carcinoma of skin of other part of trunk: Secondary | ICD-10-CM | POA: Diagnosis not present

## 2016-07-18 DIAGNOSIS — D485 Neoplasm of uncertain behavior of skin: Secondary | ICD-10-CM | POA: Diagnosis not present

## 2016-07-18 DIAGNOSIS — D0461 Carcinoma in situ of skin of right upper limb, including shoulder: Secondary | ICD-10-CM | POA: Diagnosis not present

## 2016-07-18 DIAGNOSIS — L821 Other seborrheic keratosis: Secondary | ICD-10-CM | POA: Diagnosis not present

## 2016-07-24 ENCOUNTER — Ambulatory Visit: Payer: Medicare Other | Admitting: Cardiovascular Disease

## 2016-07-25 DIAGNOSIS — R350 Frequency of micturition: Secondary | ICD-10-CM | POA: Diagnosis not present

## 2016-07-25 DIAGNOSIS — R310 Gross hematuria: Secondary | ICD-10-CM | POA: Diagnosis not present

## 2016-08-02 DIAGNOSIS — Z23 Encounter for immunization: Secondary | ICD-10-CM | POA: Diagnosis not present

## 2016-08-29 DIAGNOSIS — R350 Frequency of micturition: Secondary | ICD-10-CM | POA: Diagnosis not present

## 2016-08-29 DIAGNOSIS — R310 Gross hematuria: Secondary | ICD-10-CM | POA: Diagnosis not present

## 2016-10-04 DIAGNOSIS — H524 Presbyopia: Secondary | ICD-10-CM | POA: Diagnosis not present

## 2016-10-04 DIAGNOSIS — H25813 Combined forms of age-related cataract, bilateral: Secondary | ICD-10-CM | POA: Diagnosis not present

## 2016-10-04 DIAGNOSIS — H5213 Myopia, bilateral: Secondary | ICD-10-CM | POA: Diagnosis not present

## 2016-10-04 DIAGNOSIS — H52223 Regular astigmatism, bilateral: Secondary | ICD-10-CM | POA: Diagnosis not present

## 2016-10-04 DIAGNOSIS — H43813 Vitreous degeneration, bilateral: Secondary | ICD-10-CM | POA: Diagnosis not present

## 2016-10-11 DIAGNOSIS — L821 Other seborrheic keratosis: Secondary | ICD-10-CM | POA: Diagnosis not present

## 2016-10-11 DIAGNOSIS — Z85828 Personal history of other malignant neoplasm of skin: Secondary | ICD-10-CM | POA: Diagnosis not present

## 2016-10-11 DIAGNOSIS — L570 Actinic keratosis: Secondary | ICD-10-CM | POA: Diagnosis not present

## 2016-10-11 DIAGNOSIS — D1801 Hemangioma of skin and subcutaneous tissue: Secondary | ICD-10-CM | POA: Diagnosis not present

## 2016-10-11 DIAGNOSIS — D0421 Carcinoma in situ of skin of right ear and external auricular canal: Secondary | ICD-10-CM | POA: Diagnosis not present

## 2016-10-11 DIAGNOSIS — D044 Carcinoma in situ of skin of scalp and neck: Secondary | ICD-10-CM | POA: Diagnosis not present

## 2016-10-11 DIAGNOSIS — D485 Neoplasm of uncertain behavior of skin: Secondary | ICD-10-CM | POA: Diagnosis not present

## 2016-10-18 DIAGNOSIS — Z85828 Personal history of other malignant neoplasm of skin: Secondary | ICD-10-CM | POA: Diagnosis not present

## 2016-10-18 DIAGNOSIS — C4442 Squamous cell carcinoma of skin of scalp and neck: Secondary | ICD-10-CM | POA: Diagnosis not present

## 2016-10-25 DIAGNOSIS — R739 Hyperglycemia, unspecified: Secondary | ICD-10-CM | POA: Diagnosis not present

## 2016-10-25 DIAGNOSIS — Z0000 Encounter for general adult medical examination without abnormal findings: Secondary | ICD-10-CM | POA: Diagnosis not present

## 2016-10-25 DIAGNOSIS — Z125 Encounter for screening for malignant neoplasm of prostate: Secondary | ICD-10-CM | POA: Diagnosis not present

## 2016-10-25 DIAGNOSIS — I2510 Atherosclerotic heart disease of native coronary artery without angina pectoris: Secondary | ICD-10-CM | POA: Diagnosis not present

## 2016-10-25 DIAGNOSIS — D692 Other nonthrombocytopenic purpura: Secondary | ICD-10-CM | POA: Diagnosis not present

## 2016-10-25 DIAGNOSIS — F325 Major depressive disorder, single episode, in full remission: Secondary | ICD-10-CM | POA: Diagnosis not present

## 2016-10-25 DIAGNOSIS — E785 Hyperlipidemia, unspecified: Secondary | ICD-10-CM | POA: Diagnosis not present

## 2016-10-25 DIAGNOSIS — N200 Calculus of kidney: Secondary | ICD-10-CM | POA: Diagnosis not present

## 2016-10-25 DIAGNOSIS — Z23 Encounter for immunization: Secondary | ICD-10-CM | POA: Diagnosis not present

## 2016-10-25 DIAGNOSIS — I739 Peripheral vascular disease, unspecified: Secondary | ICD-10-CM | POA: Diagnosis not present

## 2016-10-25 DIAGNOSIS — I10 Essential (primary) hypertension: Secondary | ICD-10-CM | POA: Diagnosis not present

## 2016-10-25 DIAGNOSIS — Z79899 Other long term (current) drug therapy: Secondary | ICD-10-CM | POA: Diagnosis not present

## 2016-10-26 DIAGNOSIS — Z4802 Encounter for removal of sutures: Secondary | ICD-10-CM | POA: Diagnosis not present

## 2016-11-12 ENCOUNTER — Encounter (HOSPITAL_COMMUNITY): Payer: Self-pay | Admitting: Internal Medicine

## 2016-11-12 ENCOUNTER — Inpatient Hospital Stay (HOSPITAL_COMMUNITY)
Admission: EM | Admit: 2016-11-12 | Discharge: 2016-11-20 | DRG: 392 | Disposition: A | Discharge status: Home / Self Care | Payer: Medicare Other | Attending: Internal Medicine | Admitting: Internal Medicine

## 2016-11-12 ENCOUNTER — Emergency Department (HOSPITAL_COMMUNITY): Payer: Medicare Other

## 2016-11-12 DIAGNOSIS — F329 Major depressive disorder, single episode, unspecified: Secondary | ICD-10-CM | POA: Diagnosis present

## 2016-11-12 DIAGNOSIS — F32A Depression, unspecified: Secondary | ICD-10-CM | POA: Diagnosis present

## 2016-11-12 DIAGNOSIS — I5042 Chronic combined systolic (congestive) and diastolic (congestive) heart failure: Secondary | ICD-10-CM | POA: Diagnosis present

## 2016-11-12 DIAGNOSIS — Z7902 Long term (current) use of antithrombotics/antiplatelets: Secondary | ICD-10-CM | POA: Diagnosis not present

## 2016-11-12 DIAGNOSIS — A419 Sepsis, unspecified organism: Secondary | ICD-10-CM | POA: Diagnosis present

## 2016-11-12 DIAGNOSIS — T82898A Other specified complication of vascular prosthetic devices, implants and grafts, initial encounter: Secondary | ICD-10-CM | POA: Diagnosis present

## 2016-11-12 DIAGNOSIS — E860 Dehydration: Secondary | ICD-10-CM | POA: Diagnosis not present

## 2016-11-12 DIAGNOSIS — E861 Hypovolemia: Secondary | ICD-10-CM | POA: Diagnosis present

## 2016-11-12 DIAGNOSIS — Z79899 Other long term (current) drug therapy: Secondary | ICD-10-CM

## 2016-11-12 DIAGNOSIS — K5720 Diverticulitis of large intestine with perforation and abscess without bleeding: Secondary | ICD-10-CM | POA: Diagnosis not present

## 2016-11-12 DIAGNOSIS — F17290 Nicotine dependence, other tobacco product, uncomplicated: Secondary | ICD-10-CM | POA: Diagnosis present

## 2016-11-12 DIAGNOSIS — Z9049 Acquired absence of other specified parts of digestive tract: Secondary | ICD-10-CM | POA: Diagnosis not present

## 2016-11-12 DIAGNOSIS — Y831 Surgical operation with implant of artificial internal device as the cause of abnormal reaction of the patient, or of later complication, without mention of misadventure at the time of the procedure: Secondary | ICD-10-CM | POA: Diagnosis present

## 2016-11-12 DIAGNOSIS — I959 Hypotension, unspecified: Secondary | ICD-10-CM | POA: Diagnosis present

## 2016-11-12 DIAGNOSIS — Z882 Allergy status to sulfonamides status: Secondary | ICD-10-CM | POA: Diagnosis not present

## 2016-11-12 DIAGNOSIS — N189 Chronic kidney disease, unspecified: Secondary | ICD-10-CM | POA: Diagnosis present

## 2016-11-12 DIAGNOSIS — Z9889 Other specified postprocedural states: Secondary | ICD-10-CM

## 2016-11-12 DIAGNOSIS — R531 Weakness: Secondary | ICD-10-CM | POA: Diagnosis not present

## 2016-11-12 DIAGNOSIS — K567 Ileus, unspecified: Secondary | ICD-10-CM | POA: Diagnosis present

## 2016-11-12 DIAGNOSIS — I130 Hypertensive heart and chronic kidney disease with heart failure and stage 1 through stage 4 chronic kidney disease, or unspecified chronic kidney disease: Secondary | ICD-10-CM | POA: Diagnosis present

## 2016-11-12 DIAGNOSIS — E871 Hypo-osmolality and hyponatremia: Secondary | ICD-10-CM | POA: Diagnosis present

## 2016-11-12 DIAGNOSIS — I739 Peripheral vascular disease, unspecified: Secondary | ICD-10-CM | POA: Diagnosis present

## 2016-11-12 DIAGNOSIS — I9589 Other hypotension: Secondary | ICD-10-CM | POA: Diagnosis not present

## 2016-11-12 DIAGNOSIS — Z809 Family history of malignant neoplasm, unspecified: Secondary | ICD-10-CM

## 2016-11-12 DIAGNOSIS — N179 Acute kidney failure, unspecified: Secondary | ICD-10-CM | POA: Diagnosis present

## 2016-11-12 DIAGNOSIS — I10 Essential (primary) hypertension: Secondary | ICD-10-CM | POA: Diagnosis not present

## 2016-11-12 DIAGNOSIS — K5730 Diverticulosis of large intestine without perforation or abscess without bleeding: Secondary | ICD-10-CM | POA: Diagnosis not present

## 2016-11-12 DIAGNOSIS — R1032 Left lower quadrant pain: Secondary | ICD-10-CM | POA: Diagnosis not present

## 2016-11-12 DIAGNOSIS — K5792 Diverticulitis of intestine, part unspecified, without perforation or abscess without bleeding: Secondary | ICD-10-CM

## 2016-11-12 DIAGNOSIS — E785 Hyperlipidemia, unspecified: Secondary | ICD-10-CM | POA: Diagnosis present

## 2016-11-12 LAB — COMPREHENSIVE METABOLIC PANEL
ALT: 32 U/L (ref 17–63)
AST: 25 U/L (ref 15–41)
Albumin: 2.3 g/dL — ABNORMAL LOW (ref 3.5–5.0)
Alkaline Phosphatase: 198 U/L — ABNORMAL HIGH (ref 38–126)
Anion gap: 11 (ref 5–15)
BUN: 14 mg/dL (ref 6–20)
CO2: 24 mmol/L (ref 22–32)
Calcium: 8.1 mg/dL — ABNORMAL LOW (ref 8.9–10.3)
Chloride: 96 mmol/L — ABNORMAL LOW (ref 101–111)
Creatinine, Ser: 1.32 mg/dL — ABNORMAL HIGH (ref 0.61–1.24)
GFR calc Af Amer: 60 mL/min — ABNORMAL LOW (ref >60)
GFR calc non Af Amer: 51 mL/min — ABNORMAL LOW (ref >60)
Glucose, Bld: 125 mg/dL — ABNORMAL HIGH (ref 65–99)
Potassium: 4.6 mmol/L (ref 3.5–5.1)
Sodium: 131 mmol/L — ABNORMAL LOW (ref 135–145)
Total Bilirubin: 0.9 mg/dL (ref 0.3–1.2)
Total Protein: 5.7 g/dL — ABNORMAL LOW (ref 6.5–8.1)

## 2016-11-12 LAB — CBC WITH DIFFERENTIAL/PLATELET
Basophils Absolute: 0 10*3/uL (ref 0.0–0.1)
Basophils Relative: 0 %
Eosinophils Absolute: 0 10*3/uL (ref 0.0–0.7)
Eosinophils Relative: 0 %
HCT: 38.2 % — ABNORMAL LOW (ref 39.0–52.0)
Hemoglobin: 13.3 g/dL (ref 13.0–17.0)
Lymphocytes Relative: 5 %
Lymphs Abs: 1.3 10*3/uL (ref 0.7–4.0)
MCH: 32.1 pg (ref 26.0–34.0)
MCHC: 34.8 g/dL (ref 30.0–36.0)
MCV: 92.3 fL (ref 78.0–100.0)
Monocytes Absolute: 1.8 10*3/uL — ABNORMAL HIGH (ref 0.1–1.0)
Monocytes Relative: 7 %
Neutro Abs: 22.4 10*3/uL — ABNORMAL HIGH (ref 1.7–7.7)
Neutrophils Relative %: 88 %
Platelets: 239 10*3/uL (ref 150–400)
RBC: 4.14 MIL/uL — ABNORMAL LOW (ref 4.22–5.81)
RDW: 13.3 % (ref 11.5–15.5)
WBC Morphology: INCREASED
WBC: 25.5 10*3/uL — ABNORMAL HIGH (ref 4.0–10.5)

## 2016-11-12 LAB — URINALYSIS, ROUTINE W REFLEX MICROSCOPIC
Bilirubin Urine: NEGATIVE
Glucose, UA: 50 mg/dL — AB
Ketones, ur: 5 mg/dL — AB
Nitrite: NEGATIVE
Protein, ur: 100 mg/dL — AB
Specific Gravity, Urine: 1.015 (ref 1.005–1.030)
pH: 6 (ref 5.0–8.0)

## 2016-11-12 LAB — LIPASE, BLOOD: Lipase: 15 U/L (ref 11–51)

## 2016-11-12 LAB — I-STAT CG4 LACTIC ACID, ED: Lactic Acid, Venous: 1.28 mmol/L (ref 0.5–1.9)

## 2016-11-12 MED ORDER — MORPHINE SULFATE (PF) 4 MG/ML IV SOLN: 2.0000 mg | Freq: Once | INTRAVENOUS | Status: DC

## 2016-11-12 MED ORDER — PIPERACILLIN-TAZOBACTAM 3.375 G IVPB
3.3750 g | Freq: Three times a day (TID) | INTRAVENOUS | Status: DC
  Administered 2016-11-13 – 2016-11-20 (×22): 3.375 g via INTRAVENOUS
  Filled 2016-11-12 (×24): qty 50

## 2016-11-12 MED ORDER — PIPERACILLIN-TAZOBACTAM 3.375 G IVPB 30 MIN
3.3750 g | Freq: Once | INTRAVENOUS | Status: AC
  Administered 2016-11-12: 3.375 g via INTRAVENOUS
  Filled 2016-11-12: qty 50

## 2016-11-12 MED ORDER — SODIUM CHLORIDE 0.9 % IV BOLUS (SEPSIS)
1000.0000 mL | Freq: Once | INTRAVENOUS | Status: AC
  Administered 2016-11-12: 1000 mL via INTRAVENOUS

## 2016-11-12 MED ORDER — LACTATED RINGERS IV BOLUS (SEPSIS)
500.0000 mL | Freq: Once | INTRAVENOUS | Status: AC
  Administered 2016-11-12: 500 mL via INTRAVENOUS

## 2016-11-12 MED ORDER — IOPAMIDOL (ISOVUE-300) INJECTION 61%
INTRAVENOUS | Status: AC
  Administered 2016-11-12: 100 mL
  Filled 2016-11-12: qty 100

## 2016-11-12 NOTE — Consult Note (Signed)
Surgical Consultation Requesting provider: Dr. Ellender Hose  CC: diarrhea, abdominal pain  HPI: This is a pleasant 76yo gentleman who presents with several days' history of diarrhea, lower abdominal pain and emesis. His symptoms began with constipation on Monday, Tuesday and Wednesday last week. On Thursday his constipation gave way to large volume loose bowel movements followed by diarrhea with associated few episodes of non-bloody but bile-tinged emesis. This was also associated with lower abdominal pain, which he describes as sharp and pressure-like and radiating up the right lower quadrant and hemiabdomen. He tried some immodium with some relief and reports a normal bowel movement today.  He went to see his PCP today and was found to be hypotensive there, so was sent to the ER this evening. He denies melena or hematochezia. Denies fevers or chills.   He reports occasional bilateral foot pain in association with these episodes; of note he has a partially occluded R popliteal stent and does experience intermittent claudication which current symptoms mimic.   Also of note, he had a Mohs' surgery at the end of December and completed a 5 day course of peri-operative antibiotics.   Allergies  Allergen Reactions  . Sulfa Antibiotics Other (See Comments)    UNKNOWN    Past Medical History:  Diagnosis Date  . Abdominal distention   . Abdominal pain   . Bronchitis    hx of  . Cancer ([place])    skin  . CHF (congestive heart failure) (HCC)     (hx of EF 27%) s/p AICD 2004. Most recent LVEF > 40% , 8/11  . Chronic kidney disease    has small stones, no treatment  . Cough   . Depression    takes paxil  . Diverticulosis    hx of  . Dysrhythmia   . Easy bruising   . Hematuria    hx of  . Hernia october 2012   [facility]  . HTN (hypertension)   . Hyperlipidemia    takes zocor  . Inguinal hernia   . Left groin pain     Past Surgical History:  Procedure Laterality Date  . APPENDECTOMY    . CARDIAC  CATHETERIZATION    . CARDIAC DEFIBRILLATOR REMOVAL  2005  . HERNIA REPAIR  09/11/11   repair of LIH   . INGUINAL HERNIA REPAIR  09/11/2011   Procedure: HERNIA REPAIR INGUINAL ADULT;  Surgeon: Judieth Keens, DO;  Location: [facility];  Service: General;  Laterality: Left;  open left inguinal hernia with mesh  . LOWER EXTREMITY ANGIOGRAM N/A 02/05/2013   Procedure: LOWER EXTREMITY ANGIOGRAM;  Surgeon: Jettie Booze, MD;  Location: [facility] CATH LAB;  Service: Cardiovascular;  Laterality: N/A;    Family History  Problem Relation Age of Onset  . Cancer Mother     Social History   Social History  . Marital status: Married    Spouse name: N/A  . Number of children: N/A  . Years of education: N/A   Social History Main Topics  . Smoking status: Current Every Day Smoker    Packs/day: 1.00    Years: 48.00    Types: Cigars  . Smokeless tobacco: Never Used  . Alcohol use 3.5 oz/week    7 drink(s) per week  . Drug use: No  . Sexual activity: Not on file   Other Topics Concern  . Not on file   Social History Narrative  . No narrative on file    No current facility-administered medications on file prior  to encounter.    Current Outpatient Prescriptions on File Prior to Encounter  Medication Sig Dispense Refill  . benazepril (LOTENSIN) 20 MG tablet Take 1 tablet (20 mg total) by mouth daily. 90 tablet 1  . carvedilol (COREG) 12.5 MG tablet Take 1.5 tablets (18.75 mg total) by mouth 2 (two) times daily with a meal. 270 tablet 3  . clopidogrel (PLAVIX) 75 MG tablet Take 1 tablet (75 mg total) by mouth daily with breakfast. 30 tablet 11  . mirabegron ER (MYRBETRIQ) 50 MG TB24 tablet Take 50 mg by mouth daily.    . Multiple Vitamins-Minerals (ONE-A-DAY MENS 50+ ADVANTAGE PO) Take 1 tablet by mouth daily.     . simvastatin (ZOCOR) 40 MG tablet Take 40 mg by mouth at bedtime.     [name] venlafaxine XR (EFFEXOR-XR) 37.5 MG 24 hr capsule Take 1 capsule by mouth 2 (two) times daily.       Review of Systems: a complete, 10pt review of systems was completed with pertinent positives and negatives as documented in the HPI.   Physical Exam: Vitals:   11/12/16 1930 11/12/16 2015  BP: 130/67 130/67  Pulse: 74 79  Resp: (!) 27 (!) 27  Temp:     Gen: A&Ox3, no distress  Head: normocephalic, atraumatic, EOMI, anicteric.  Neck: supple without mass or thyromegaly Chest: unlabored respirations, symmetrical air entry  Cardiovascular: RRR with no peripheral edema Abdomen: soft, minimally distended, mildly tender in bilateral lower abdomen. No rebound or guarding, no peritonitis Extremities: warm, no deformities  Neuro: grossly intact Psych: appropriate mood and affect  Skin: warm and dry   CBC Latest Ref Rng & Units 11/12/2016 02/05/2013 09/07/2011  WBC 4.0 - 10.5 K/uL 25.5(H) 7.6 9.1  Hemoglobin 13.0 - 17.0 g/dL 13.3 15.2 15.2  Hematocrit 39.0 - 52.0 % 38.2(L) 42.1 43.2  Platelets 150 - 400 K/uL 239 169 157    CMP Latest Ref Rng & Units 11/12/2016 12/30/2014 09/07/2011  Glucose 65 - 99 mg/dL 125(H) 105(H) 91  BUN 6 - 20 mg/dL 14 11 12   Creatinine 0.61 - 1.24 mg/dL 1.32(H) 0.92 0.92  Sodium 135 - 145 mmol/L 131(L) 140 139  Potassium 3.5 - 5.1 mmol/L 4.6 3.9 4.2  Chloride 101 - 111 mmol/L 96(L) 105 102  CO2 22 - 32 mmol/L 24 32 27  Calcium 8.9 - 10.3 mg/dL 8.1(L) 8.9 9.3  Total Protein 6.5 - 8.1 g/dL 5.7(L) - -  Total Bilirubin 0.3 - 1.2 mg/dL 0.9 - -  Alkaline Phos 38 - 126 U/L 198(H) - -  AST 15 - 41 U/L 25 - -  ALT 17 - 63 U/L 32 - -    Lab Results  Component Value Date   INR 1.02 02/05/2013    Imaging: CLINICAL DATA:  Left lower quadrant pain, nausea, vomiting and diarrhea x5 days.  EXAM: CT ABDOMEN AND PELVIS WITH CONTRAST  TECHNIQUE: Multidetector CT imaging of the abdomen and pelvis was performed using the standard protocol following bolus administration of intravenous contrast.  CONTRAST:  160mL ISOVUE-300 IOPAMIDOL (ISOVUE-300) INJECTION  61%  COMPARISON:  10/12/2014 CT  FINDINGS: Lower chest: Right atrial and right ventricular pacing leads noted. Normal size cardiac chambers. Atelectasis at the right lung base posteriorly. No significant pleural effusion. No pneumothorax. Small hiatal hernia.  Hepatobiliary: Scattered hepatic calcifications consistent with old granulomatous disease. No biliary dilatation. Gallbladder is free of stones.  Pancreas: No acute abnormality. No space-occupying mass or ductal dilatation.  Spleen: No splenomegaly. Numerous scattered splenic calcifications  consistent with old granulomatous disease.  Adrenals/Urinary Tract: No adrenal mass. No obstructive uropathy. Mild perinephric fat stranding on the right. Stable bilateral renal hypodensities consistent statistically with cysts the largest is in the left lower pole measuring 18 mm in diameter. Urinary bladder is contracted.  Stomach/Bowel: Hypodensity within the mesenteric side of the sigmoid colon consistent with an intramural abscess measuring approximately 12 mm in diameter possibly from diverticulitis. Numerous fluid-filled mild-to-moderately distended small bowel loops are noted to the level of the terminal ileum likely from a sympathetic ileus.  Vascular/Lymphatic: Aortoiliac atherosclerosis. No aneurysm or dissection. Small subcentimeter mesenteric lymph nodes.  Reproductive: Enlarged prostate up to 5.6 x 5.1 x 5.7 cm (volume = 85 cm^3).  Other: Small amount of ascites overlies the dome of the liver and is seen in the pelvis.  Musculoskeletal: No acute or significant osseous findings.  IMPRESSION: 1. 12 mm hypodensity within the wall of the sigmoid colon suspicious for an intramural abscess in the setting of oral diverticulitis. Sympathetic fluid-filled distention of small bowel consistent with ileus. 2. Hepatic and splenic calcifications consistent with old granulomatous disease. 3. Stable bilateral  renal hypodensities consistent with renal cysts. 4. Prostatic enlargement. 5. Right basilar atelectasis.   Electronically Signed   By: Ashley Royalty M.D.   On: 11/12/2016 21:18  A/P: 76yo man with diverticulitis with small intramural abscess. Dehydration/hypovolemia, hyponatremia, AKI,  -Rule out c diff given recent abx and diarrhea/WBC -IVF resuscitation -NPO, IV antibiotics until pain resolved and WBC normalized.  -Will continue to follow   Romana Juniper, MD [facility], [place] Pager [phone]

## 2016-11-12 NOTE — ED Provider Notes (Signed)
[facility] DEPT Provider Note   CSN: [id number] Arrival date & time: 11/12/16  1646     History   Chief Complaint No chief complaint on file.   HPI Nicholas Herrera is a 76 y.o. male.  HPI 76 year old male with extensive past medical history as below here with nausea, vomiting, and abdominal pain. Patient reports a history of irritable bowel syndrome. Over the last week and a half, he has had progressively worsening loose, nonbloody stools. He initially had intermittent lower abdominal cramping. He felt this was due to his irritable bowel so he was taking symptomatically relief as needed. However, over the last several days, he has developed profuse, watery diarrhea along with progressively worsening aching, gnawing bilateral lower quadrant abdominal pain. The pain is worse with bowel movements as well as any palpation. Denies any alleviating factors. He's had some subjective fatigue but no fevers or chills.  Past Medical History:  Diagnosis Date  . Abdominal distention   . Abdominal pain   . Bronchitis    hx of  . Cancer ([place])    skin  . CHF (congestive heart failure) (HCC)     (hx of EF 27%) s/p AICD 2004. Most recent LVEF > 40% , 8/11  . Chronic kidney disease    has small stones, no treatment  . Cough   . Depression    takes paxil  . Diverticulosis    hx of  . Dysrhythmia   . Easy bruising   . Hematuria    hx of  . Hernia october 2012   [facility]  . HTN (hypertension)   . Hyperlipidemia    takes zocor  . Inguinal hernia   . Left groin pain     Patient Active Problem List   Diagnosis Date Noted  . Hematuria 11/24/2014  . 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 Essential hypertension 11/24/2014  . Peripheral vascular disease ([place]) 09/03/2013  . Tobacco use disorder 09/03/2013  . Hyperlipidemia   . Cancer ([place])   . Dysrhythmia   . Depression   . Hernia 07/30/2011    Past Surgical History:  Procedure Laterality Date  .  APPENDECTOMY    . CARDIAC CATHETERIZATION    . CARDIAC DEFIBRILLATOR REMOVAL  2005  . HERNIA REPAIR  09/11/11   repair of LIH   . INGUINAL HERNIA REPAIR  09/11/2011   Procedure: HERNIA REPAIR INGUINAL ADULT;  Surgeon: Judieth Keens, DO;  Location: [facility];  Service: General;  Laterality: Left;  open left inguinal hernia with mesh  . LOWER EXTREMITY ANGIOGRAM N/A 02/05/2013   Procedure: LOWER EXTREMITY ANGIOGRAM;  Surgeon: Jettie Booze, MD;  Location[facility] C[facility] LAB;  Service: Cardiovascular;  Laterality: N/A;       Home Medications    Prior to Admission medications   Medication Sig Start Date End Date Taking? Authorizing Provider  acetaminophen (TYLENOL) 500 MG tablet Take 500-1,000 mg by mouth every 8 (eight) hours as needed.    Yes Historical Provider, MD  benazepril (LOTENSIN) 20 MG tablet Take 1 tablet (20 mg total) by mouth daily. 12/30/14  Yes [name], MD  carvedilol (COREG) 12.5 MG tablet Take 1.5 tablets (18.75 mg total) by mouth 2 (two) times daily with a meal. 06/12/16  Yes [name], MD  clopidogrel (PLAVIX) 75 MG tablet Take 1 tablet (75 mg total) by mouth daily with breakfast. 02/06/13  Yes Jettie Booze, MD  mirabegron ER (MYRBETRIQ) 50 MG TB24 tablet  Take 50 mg by mouth daily.   Yes Historical Provider, MD  Multiple Vitamins-Minerals (ONE-A-DAY MENS 50+ ADVANTAGE PO) Take 1 tablet by mouth daily.    Yes Historical Provider, MD  PARoxetine (PAXIL) 40 MG tablet Take 40 mg by mouth daily. 10/18/16  Yes Historical Provider, MD  simvastatin (ZOCOR) 40 MG tablet Take 40 mg by mouth at bedtime.  07/31/11  Yes Historical Provider, MD  venlafaxine XR (EFFEXOR-XR) 37.5 MG 24 hr capsule Take 1 capsule by mouth 2 (two) times daily. 05/09/16  Yes Historical Provider, MD    Family History Family History  Problem Relation Age of Onset  . Cancer Mother     Social History Social History  Substance Use Topics  . Smoking status: Current Every Day Smoker     Packs/day: 1.00    Years: 48.00    Types: Cigars  . Smokeless tobacco: Never Used  . Alcohol use 3.5 oz/week    7 drink(s) per week     Allergies   Sulfa antibiotics   Review of Systems Review of Systems  Constitutional: Positive for fatigue. Negative for fever.  HENT: Negative for congestion and rhinorrhea.   Eyes: Negative for visual disturbance.  Respiratory: Negative for cough, shortness of breath and wheezing.   Cardiovascular: Negative for chest pain and leg swelling.  Gastrointestinal: Positive for abdominal pain and diarrhea. Negative for nausea and vomiting.  Genitourinary: Negative for dysuria and flank pain.  Musculoskeletal: Negative for neck pain and neck stiffness.  Skin: Negative for rash and wound.  Allergic/Immunologic: Negative for immunocompromised state.  Neurological: Positive for weakness. Negative for syncope and headaches.  All other systems reviewed and are negative.    Physical Exam Updated Vital Signs BP 130/67   Pulse 79   Temp 98.1 F (36.7 C) (Oral)   Resp (!) 27   Ht 5\' 11"  (1.803 m)   Wt 147 lb (66.7 kg)   SpO2 96%   BMI 20.50 kg/m   Physical Exam  Constitutional: He is oriented to person, place, and time. He appears well-developed and well-nourished. No distress.  HENT:  Head: Normocephalic and atraumatic.  Dry mucous membranes  Eyes: Conjunctivae are normal.  Neck: Neck supple.  Cardiovascular: Normal rate, regular rhythm and normal heart sounds.  Exam reveals no friction rub.   No murmur heard. Pulmonary/Chest: Effort normal and breath sounds normal. No respiratory distress. He has no wheezes. He has no rales.  Abdominal: Soft. Normal appearance. He exhibits no distension. There is tenderness in the right lower quadrant, suprapubic area and left lower quadrant. There is guarding. There is no rigidity and no rebound.  Musculoskeletal: He exhibits no edema.  Neurological: He is alert and oriented to person, place, and time. He  exhibits normal muscle tone.  Skin: Skin is warm. Capillary refill takes 2 to 3 seconds.  Psychiatric: He has a normal mood and affect.  Nursing note and vitals reviewed.    ED Treatments / Results  Labs (all labs ordered are listed, but only abnormal results are displayed) Labs Reviewed  CBC WITH DIFFERENTIAL/PLATELET - Abnormal; Notable for the following:       Result Value   WBC 25.5 (*)    RBC 4.14 (*)    HCT 38.2 (*)    Neutro Abs 22.4 (*)    Monocytes Absolute 1.8 (*)    All other components within normal limits  COMPREHENSIVE METABOLIC PANEL - Abnormal; Notable for the following:    Sodium 131 (*)  Chloride 96 (*)    Glucose, Bld 125 (*)    Creatinine, Ser 1.32 (*)    Calcium 8.1 (*)    Total Protein 5.7 (*)    Albumin 2.3 (*)    Alkaline Phosphatase 198 (*)    GFR calc non Af Amer 51 (*)    GFR calc Af Amer 60 (*)    All other components within normal limits  URINALYSIS, ROUTINE W REFLEX MICROSCOPIC - Abnormal; Notable for the following:    Color, Urine AMBER (*)    APPearance CLOUDY (*)    Glucose, UA 50 (*)    Hgb urine dipstick MODERATE (*)    Ketones, ur 5 (*)    Protein, ur 100 (*)    Leukocytes, UA TRACE (*)    Bacteria, UA RARE (*)    Squamous Epithelial / LPF 0-5 (*)    All other components within normal limits  URINE CULTURE  CULTURE, BLOOD (ROUTINE X 2)  CULTURE, BLOOD (ROUTINE X 2)  LIPASE, BLOOD  I-STAT CG4 LACTIC ACID, ED  I-STAT CG4 LACTIC ACID, ED    EKG  EKG Interpretation None       Radiology No results found.  Procedures Procedures (including critical care time)  Medications Ordered in ED Medications  piperacillin-tazobactam (ZOSYN) IVPB 3.375 g (not administered)  sodium chloride 0.9 % bolus 1,000 mL (0 mLs Intravenous Stopped 11/12/16 2027)  sodium chloride 0.9 % bolus 1,000 mL (0 mLs Intravenous Stopped 11/12/16 1905)  piperacillin-tazobactam (ZOSYN) IVPB 3.375 g (0 g Intravenous Stopped 11/12/16 2027)  lactated ringers  bolus 500 mL (0 mLs Intravenous Stopped 11/12/16 2027)  iopamidol (ISOVUE-300) 61 % injection (100 mLs  Contrast Given 11/12/16 2039)     Initial Impression / Assessment and Plan / ED Course  I have reviewed the triage vital signs and the nursing notes.  Pertinent labs & imaging results that were available during my care of the patient were reviewed by me and considered in my medical decision making (see chart for details).  Clinical Course     76 year old male with past medical history as above here with progressively worsening lower abdominal pain with associated diarrhea. Patient reportedly hypotensive to the [id number] systolic prior to arrival. On arrival here, however, blood pressure is improved. He is nontoxic. Labwork shows markedly leukocytosis, mild lactic acidosis, and likely mild dehydration with a KI. Concern for possible diverticulitis versus colitis. Will obtain CT scan. Otherwise, given his significant leukocytosis and transient hypotension, code sepsis initiated and will start IV Zosyn for broad-spectrum intra-abdominal coverage.  Blood pressure continues to improve with fluids. He is feeling improved on exam. CT scan obtained and shows diverticulitis with intramural abscess. No obvious perforation. D/w Dr. Kae Heller of [facility], will admit to Hospitalist with outpt f/u.  Final Clinical Impressions(s) / ED Diagnoses   Final diagnoses:  Sepsis, due to unspecified organism [facility])  Diverticulitis of large intestine with abscess without bleeding    This note was prepared with assistance of Dragon voice recognition software. Occasional wrong-word or sound-a-like substitutions may have occurred due to the inherent limitations of voice recognition software.    Duffy Bruce, MD 11/12/16 [phone]

## 2016-11-12 NOTE — ED Notes (Signed)
Pt does not want morphine at this time. He states he is comfortable

## 2016-11-12 NOTE — ED Notes (Signed)
PT wife is in room at bedside.

## 2016-11-12 NOTE — Progress Notes (Signed)
Pharmacy Antibiotic Note  Nicholas Herrera is a 76 y.o. male admitted on 11/12/2016 with intra-abdominal infection.  Pharmacy has been consulted for zosyn dosing.  Plan: Zosyn 3.375g IV q8h (4 hour infusion).  Monitor culture data, renal function and clinical course   Height: 5\' 11"  (180.3 cm) Weight: 147 lb (66.7 kg) IBW/kg (Calculated) : 75.3  Temp (24hrs), Avg:98.1 F (36.7 C), Min:98.1 F (36.7 C), Max:98.1 F (36.7 C)   Recent Labs Lab 11/12/16 1748 11/12/16 1809  WBC 25.5*  --   CREATININE 1.32*  --   LATICACIDVEN  --  1.28    Estimated Creatinine Clearance: 46.3 mL/min (by C-G formula based on SCr of 1.32 mg/dL (H)).    Allergies  Allergen Reactions  . Sulfa Antibiotics Other (See Comments)    UNKNOWN     Andrey Cota. Diona Foley, PharmD, [place] Clinical Pharmacist Pager [phone] 11/12/2016 6:21 PM

## 2016-11-12 NOTE — ED Triage Notes (Signed)
Patient comes in with c/o [facility] with hypotension, abd pain, and d/n/v for 5 days. Patient went to MD office and they noted bp of 56/32. 720 ccs of NS given en route. Last bp per ems is 126/76. HR 87, temp 98.1. 18 in LAC. Patient took imodium with relief. Patient was constipated last week.

## 2016-11-12 NOTE — ED Notes (Signed)
Pt did not need anything at this time  

## 2016-11-13 DIAGNOSIS — I10 Essential (primary) hypertension: Secondary | ICD-10-CM

## 2016-11-13 DIAGNOSIS — A419 Sepsis, unspecified organism: Secondary | ICD-10-CM

## 2016-11-13 LAB — COMPREHENSIVE METABOLIC PANEL
ALT: 24 U/L (ref 17–63)
AST: 18 U/L (ref 15–41)
Albumin: 2.3 g/dL — ABNORMAL LOW (ref 3.5–5.0)
Alkaline Phosphatase: 154 U/L — ABNORMAL HIGH (ref 38–126)
Anion gap: 9 (ref 5–15)
BUN: 13 mg/dL (ref 6–20)
CO2: 23 mmol/L (ref 22–32)
Calcium: 8 mg/dL — ABNORMAL LOW (ref 8.9–10.3)
Chloride: 104 mmol/L (ref 101–111)
Creatinine, Ser: 1.01 mg/dL (ref 0.61–1.24)
GFR calc Af Amer: >60 mL/min (ref >60)
GFR calc non Af Amer: >60 mL/min (ref >60)
Glucose, Bld: 108 mg/dL — ABNORMAL HIGH (ref 65–99)
Potassium: 3.5 mmol/L (ref 3.5–5.1)
Sodium: 136 mmol/L (ref 135–145)
Total Bilirubin: 0.6 mg/dL (ref 0.3–1.2)
Total Protein: 5 g/dL — ABNORMAL LOW (ref 6.5–8.1)

## 2016-11-13 LAB — CBC WITH DIFFERENTIAL/PLATELET
Basophils Absolute: 0 10*3/uL (ref 0.0–0.1)
Basophils Relative: 0 %
Eosinophils Absolute: 0 10*3/uL (ref 0.0–0.7)
Eosinophils Relative: 0 %
HCT: 32.8 % — ABNORMAL LOW (ref 39.0–52.0)
Hemoglobin: 11.1 g/dL — ABNORMAL LOW (ref 13.0–17.0)
Lymphocytes Relative: 9 %
Lymphs Abs: 1.7 10*3/uL (ref 0.7–4.0)
MCH: 31.3 pg (ref 26.0–34.0)
MCHC: 33.8 g/dL (ref 30.0–36.0)
MCV: 92.4 fL (ref 78.0–100.0)
Monocytes Absolute: 1.3 10*3/uL — ABNORMAL HIGH (ref 0.1–1.0)
Monocytes Relative: 7 %
Neutro Abs: 16.5 10*3/uL — ABNORMAL HIGH (ref 1.7–7.7)
Neutrophils Relative %: 84 %
Platelets: 236 10*3/uL (ref 150–400)
RBC: 3.55 MIL/uL — ABNORMAL LOW (ref 4.22–5.81)
RDW: 13.5 % (ref 11.5–15.5)
WBC: 19.5 10*3/uL — ABNORMAL HIGH (ref 4.0–10.5)

## 2016-11-13 LAB — URINE CULTURE: Culture: NO GROWTH

## 2016-11-13 LAB — C DIFFICILE QUICK SCREEN W PCR REFLEX
C Diff antigen: NEGATIVE
C Diff interpretation: NOT DETECTED
C Diff toxin: NEGATIVE

## 2016-11-13 LAB — GLUCOSE, CAPILLARY
Glucose-Capillary: 129 mg/dL — ABNORMAL HIGH (ref 65–99)
Glucose-Capillary: 116 mg/dL — ABNORMAL HIGH (ref 65–99)

## 2016-11-13 MED ORDER — ACETAMINOPHEN 650 MG RE SUPP: 650.0000 mg | Freq: Four times a day (QID) | RECTAL | Status: DC | PRN

## 2016-11-13 MED ORDER — SODIUM CHLORIDE 0.9 % IV SOLN
30.0000 meq | Freq: Once | INTRAVENOUS | Status: AC
  Administered 2016-11-13: 30 meq via INTRAVENOUS
  Filled 2016-11-13: qty 15

## 2016-11-13 MED ORDER — HYDRALAZINE HCL 20 MG/ML IJ SOLN
10.0000 mg | INTRAMUSCULAR | Status: DC | PRN
  Administered 2016-11-14 – 2016-11-20 (×4): 10 mg via INTRAVENOUS
  Filled 2016-11-13 (×6): qty 1

## 2016-11-13 MED ORDER — ZOLPIDEM TARTRATE 5 MG PO TABS
5.0000 mg | ORAL_TABLET | Freq: Once | ORAL | Status: AC
  Administered 2016-11-13: 5 mg via ORAL
  Filled 2016-11-13: qty 1

## 2016-11-13 MED ORDER — ONDANSETRON HCL 4 MG PO TABS: 4.0000 mg | ORAL_TABLET | Freq: Four times a day (QID) | ORAL | Status: DC | PRN

## 2016-11-13 MED ORDER — DEXTROSE-NACL 5-0.9 % IV SOLN
INTRAVENOUS | Status: AC
  Administered 2016-11-13: 1000 mL via INTRAVENOUS
  Administered 2016-11-13: 01:00:00 via INTRAVENOUS

## 2016-11-13 MED ORDER — METOPROLOL TARTRATE 5 MG/5ML IV SOLN
5.0000 mg | Freq: Four times a day (QID) | INTRAVENOUS | Status: DC
  Administered 2016-11-13 – 2016-11-15 (×8): 5 mg via INTRAVENOUS
  Filled 2016-11-13 (×9): qty 5

## 2016-11-13 MED ORDER — ACETAMINOPHEN 325 MG PO TABS
650.0000 mg | ORAL_TABLET | Freq: Four times a day (QID) | ORAL | Status: DC | PRN
  Filled 2016-11-13: qty 2

## 2016-11-13 MED ORDER — ONDANSETRON HCL 4 MG/2ML IJ SOLN: 4.0000 mg | Freq: Four times a day (QID) | INTRAMUSCULAR | Status: DC | PRN

## 2016-11-13 NOTE — Progress Notes (Signed)
[facility] Progress Note     Subjective: Abdominal pain improving. Reports he has been told he has diverticulosis after a colonoscopy but this is his first time receiving antibiotic treatment for diverticulitis. Denies fever, chills, night sweats, nausea, vomiting. Had a BM last night that was loose and non-bloody.   C.dif negative WBC trending down   Objective: Vital signs in last 24 hours: Temp:  [98.1 F (36.7 C)-98.9 F (37.2 C)] 98.9 F (37.2 C) (01/16 0440) Pulse Rate:  [68-79] 75 (01/16 0707) Resp:  [16-27] 16 (01/16 0440) BP: (111-146)/(58-116) 127/62 (01/16 0707) SpO2:  [91 %-100 %] 91 % (01/16 0440) Weight:  [66.7 kg (147 lb)-67.9 kg (149 lb 9.6 oz)] 67.9 kg (149 lb 9.6 oz) (01/16 0026) Last BM Date: 11/11/16  Intake/Output from previous day: 01/15 0701 - 01/16 0700 In: 999 [IV Piggyback:999] Out: 30 [Urine:30] Intake/Output this shift: No intake/output data recorded.  PE: Gen:  Alert, NAD, pleasant Pulm:  Unlabored Abd: Soft, TTP RLQ without guarding or peritonitis,  +BS Ext:  No erythema, edema, or tenderness   Lab Results:   Recent Labs  11/12/16 1748 11/13/16 0444  WBC 25.5* 19.5*  HGB 13.3 11.1*  HCT 38.2* 32.8*  PLT 239 236   BMET  Recent Labs  11/12/16 1748 11/13/16 0444  NA 131* 136  K 4.6 3.5  CL 96* 104  CO2 24 23  GLUCOSE 125* 108*  BUN 14 13  CREATININE 1.32* 1.01  CALCIUM 8.1* 8.0*   PT/INR No results for input(s): LABPROT, INR in the last 72 hours. CMP     Component Value Date/Time   NA 136 11/13/2016 0444   K 3.5 11/13/2016 0444   CL 104 11/13/2016 0444   CO2 23 11/13/2016 0444   GLUCOSE 108 (H) 11/13/2016 0444   BUN 13 11/13/2016 0444   CREATININE 1.01 11/13/2016 0444   CALCIUM 8.0 (L) 11/13/2016 0444   PROT 5.0 (L) 11/13/2016 0444   ALBUMIN 2.3 (L) 11/13/2016 0444   AST 18 11/13/2016 0444   ALT 24 11/13/2016 0444   ALKPHOS 154 (H) 11/13/2016 0444   BILITOT 0.6 11/13/2016 0444   GFRNONAA >60  11/13/2016 0444   GFRAA >60 11/13/2016 0444   Lipase     Component Value Date/Time   LIPASE 15 11/12/2016 1748   Studies/Results: Ct Abdomen Pelvis W Contrast  Result Date: 11/12/2016 CLINICAL DATA:  Left lower quadrant pain, nausea, vomiting and diarrhea x5 days. EXAM: CT ABDOMEN AND PELVIS WITH CONTRAST TECHNIQUE: Multidetector CT imaging of the abdomen and pelvis was performed using the standard protocol following bolus administration of intravenous contrast. CONTRAST:  169mL ISOVUE-300 IOPAMIDOL (ISOVUE-300) INJECTION 61% COMPARISON:  10/12/2014 CT FINDINGS: Lower chest: Right atrial and right ventricular pacing leads noted. Normal size cardiac chambers. Atelectasis at the right lung base posteriorly. No significant pleural effusion. No pneumothorax. Small hiatal hernia. Hepatobiliary: Scattered hepatic calcifications consistent with old granulomatous disease. No biliary dilatation. Gallbladder is free of stones. Pancreas: No acute abnormality. No space-occupying mass or ductal dilatation. Spleen: No splenomegaly. Numerous scattered splenic calcifications consistent with old granulomatous disease. Adrenals/Urinary Tract: No adrenal mass. No obstructive uropathy. Mild perinephric fat stranding on the right. Stable bilateral renal hypodensities consistent statistically with cysts the largest is in the left lower pole measuring 18 mm in diameter. Urinary bladder is contracted. Stomach/Bowel: Hypodensity within the mesenteric side of the sigmoid colon consistent with an intramural abscess measuring approximately 12 mm in diameter possibly from diverticulitis. Numerous fluid-filled mild-to-moderately distended small  bowel loops are noted to the level of the terminal ileum likely from a sympathetic ileus. Vascular/Lymphatic: Aortoiliac atherosclerosis. No aneurysm or dissection. Small subcentimeter mesenteric lymph nodes. Reproductive: Enlarged prostate up to 5.6 x 5.1 x 5.7 cm (volume = 85 cm^3). Other:  Small amount of ascites overlies the dome of the liver and is seen in the pelvis. Musculoskeletal: No acute or significant osseous findings. IMPRESSION: 1. 12 mm hypodensity within the wall of the sigmoid colon suspicious for an intramural abscess in the setting of oral diverticulitis. Sympathetic fluid-filled distention of small bowel consistent with ileus. 2. Hepatic and splenic calcifications consistent with old granulomatous disease. 3. Stable bilateral renal hypodensities consistent with renal cysts. 4. Prostatic enlargement. 5. Right basilar atelectasis. Electronically Signed   By: Ashley Royalty M.D.   On: 11/12/2016 21:18    Anti-infectives: Anti-infectives    Start     Dose/Rate Route Frequency Ordered Stop   11/13/16 0130  piperacillin-tazobactam (ZOSYN) IVPB 3.375 g     3.375 g 12.5 mL/hr over 240 Minutes Intravenous Every 8 hours 11/12/16 1942     11/12/16 1830  piperacillin-tazobactam (ZOSYN) IVPB 3.375 g     3.375 g 100 mL/hr over 30 Minutes Intravenous  Once 11/12/16 1819 11/12/16 2027      Assessment/Plan Sigmoid diverticulitis with small intramural abscess - c.diff negative - WBC 19.5 from 25.5 on IV Zosyn - continue NPO, IVF   ARF - SCr normalized (1.01) w/ IVF rehydration HTN - PRN hydralazine PVD s/p right popliteal stent - Plavix held 1/15  FEN: NPO, IVF ID: Zosyn 1/15 >> VTE: SCD's  Plan: Abdominal pain improving w/ bowel rest and IV abx. WBC trending down. Would continue NPO, IVF, IV abx today and consider clear liquid diet tomorrow if patient continues to improve.    LOS: 1 day    [facility] 11/13/2016, 7:38 AM Pager: [phone] Consults: [phone] Mon-Fri 7:00 am-4:30 pm Sat-Sun 7:00 am-11:30 am

## 2016-11-13 NOTE — H&P (Signed)
History and Physical    Nicholas Herrera [id number] DOB: [birth year] DOA: 11/12/2016  PCP: Lilian Coma, MD  Patient coming from: Home.  Chief Complaint: Hypotension.  HPI: Nicholas Herrera is a 76 y.o. male with history of hypertension, peripheral vascular disease was referred to the ER from primary care office after patient was found to be hypotensive. Last week during early part, patient had constipation followed by diarrhea in the later half. Diarrhea stopped after patient had Imodium. But patient has been having some lower quadrant discomfort with subjective feeling of fever and chills and followed up with his PCP yesterday. Patient also has been feeling weak and fatigued yesterday. In the PCPs office patient was found to be hypotensive with systolic blood pressure in the 50s. EMS was called and patient was given fluid bolus and transferred to ER. In the ER patient's blood pressure improved with fluids. Since patient had abdominal discomfort in the lower quadrant CT of the abdomen and pelvis was done which showed possible sigmoid abscess with ileus. Gen. surgery was consulted. Patient had Mohs surgery last month during which patient had received 5 days of antibiotics.  Patient also had one episode of vomiting last week. Denies any chest pain or shortness of breath.   ED Course: Patient was started on fluids. Empiric antibiotics for sigmoid abscess. General surgery consulted.  Review of Systems: As per HPI, rest all negative.   Past Medical History:  Diagnosis Date  . Abdominal distention   . Abdominal pain   . Bronchitis    hx of  . Cancer ([place])    skin  . CHF (congestive heart failure) (HCC)     (hx of EF 27%) s/p AICD 2004. Most recent LVEF > 40% , 8/11  . Chronic kidney disease    has small stones, no treatment  . Cough   . Depression    takes paxil  . Diverticulosis    hx of  . Dysrhythmia   . Easy bruising   . Hematuria    hx of  . Hernia october 2012   [facility]    . HTN (hypertension)   . Hyperlipidemia    takes zocor  . Inguinal hernia   . Left groin pain     Past Surgical History:  Procedure Laterality Date  . APPENDECTOMY    . CARDIAC CATHETERIZATION    . CARDIAC DEFIBRILLATOR REMOVAL  2005  . HERNIA REPAIR  09/11/11   repair of LIH   . INGUINAL HERNIA REPAIR  09/11/2011   Procedure: HERNIA REPAIR INGUINAL ADULT;  Surgeon: Judieth Keens, DO;  Location: [facility];  Service: General;  Laterality: Left;  open left inguinal hernia with mesh  . LOWER EXTREMITY ANGIOGRAM N/A 02/05/2013   Procedure: LOWER EXTREMITY ANGIOGRAM;  Surgeon: Jettie Booze, MD;  Location[facility] CATH LAB;  Service: Cardiovascular;  Laterality: N/A;     reports that he has been smoking Cigars.  He has a 48.00 pack-year smoking history. He has never used smokeless tobacco. He reports that he drinks about 3.5 oz of alcohol per week . He reports that he does not use drugs.  Allergies  Allergen Reactions  . Sulfa Antibiotics Other (See Comments)    UNKNOWN    Family History  Problem Relation Age of Onset  . Cancer Mother     Prior to Admission medications   Medication Sig Start Date End Date Taking? Authorizing Provider  acetaminophen (TYLENOL) 500 MG tablet Take 500-1,000 mg by mouth every  8 (eight) hours as needed.    Yes Historical Provider, MD  benazepril (LOTENSIN) 20 MG tablet Take 1 tablet (20 mg total) by mouth daily. 12/30/14  Yes [name], MD  carvedilol (COREG) 12.5 MG tablet Take 1.5 tablets (18.75 mg total) by mouth 2 (two) times daily with a meal. 06/12/16  Yes [name], MD  clopidogrel (PLAVIX) 75 MG tablet Take 1 tablet (75 mg total) by mouth daily with breakfast. 02/06/13  Yes Jettie Booze, MD  mirabegron ER (MYRBETRIQ) 50 MG TB24 tablet Take 50 mg by mouth daily.   Yes Historical Provider, MD  Multiple Vitamins-Minerals (ONE-A-DAY MENS 50+ ADVANTAGE PO) Take 1 tablet by mouth daily.    Yes Historical Provider, MD  PARoxetine (PAXIL)  40 MG tablet Take 40 mg by mouth daily. 10/18/16  Yes Historical Provider, MD  simvastatin (ZOCOR) 40 MG tablet Take 40 mg by mouth at bedtime.  07/31/11  Yes Historical Provider, MD  venlafaxine XR (EFFEXOR-XR) 37.5 MG 24 hr capsule Take 1 capsule by mouth 2 (two) times daily. 05/09/16  Yes Historical Provider, MD    Physical Exam: Vitals:   11/12/16 2202 11/12/16 2245 11/12/16 2315 11/12/16 2345  BP: (!) 146/116 120/69 137/63 127/67  Pulse: 77 71 73 72  Resp:  26 25 22   Temp:      TempSrc:      SpO2: 93% 94% 95% 95%  Weight:      Height:          Constitutional: Moderately built and nourished. Vitals:   11/12/16 2202 11/12/16 2245 11/12/16 2315 11/12/16 2345  BP: (!) 146/116 120/69 137/63 127/67  Pulse: 77 71 73 72  Resp:  26 25 22   Temp:      TempSrc:      SpO2: 93% 94% 95% 95%  Weight:      Height:       Eyes: Anicteric. No pallor. ENMT: No discharge from the ears eyes nose and mouth. Neck: No mass felt. No neck rigidity. Respiratory: No rhonchi or crepitations. Cardiovascular: S1 and S2 heard. No murmurs appreciated. Abdomen: Mild discomfort in the lower quadrants. No guarding or rigidity. Musculoskeletal: No edema. Pulses full. Skin: No rash. Neurologic: Alert awake oriented to time place and person. Moves all extremities. Psychiatric: Appears normal. Normal affect.   Labs on Admission: I have personally reviewed following labs and imaging studies  CBC:  Recent Labs Lab 11/12/16 1748  WBC 25.5*  NEUTROABS 22.4*  HGB 13.3  HCT 38.2*  MCV 92.3  PLT [id number]   Basic Metabolic Panel:  Recent Labs Lab 11/12/16 1748  NA 131*  K 4.6  CL 96*  CO2 24  GLUCOSE 125*  BUN 14  CREATININE 1.32*  CALCIUM 8.1*   GFR: Estimated Creatinine Clearance: 46.3 mL/min (by C-G formula based on SCr of 1.32 mg/dL (H)). Liver Function Tests:  Recent Labs Lab 11/12/16 1748  AST 25  ALT 32  ALKPHOS 198*  BILITOT 0.9  PROT 5.7*  ALBUMIN 2.3*    Recent Labs Lab  11/12/16 1748  LIPASE 15   No results for input(s): AMMONIA in the last 168 hours. Coagulation Profile: No results for input(s): INR, PROTIME in the last 168 hours. Cardiac Enzymes: No results for input(s): CKTOTAL, CKMB, CKMBINDEX, TROPONINI in the last 168 hours. BNP (last 3 results) No results for input(s): PROBNP in the last 8760 hours. HbA1C: No results for input(s): HGBA1C in the last 72 hours. CBG: No results for input(s): GLUCAP  in the last 168 hours. Lipid Profile: No results for input(s): CHOL, HDL, LDLCALC, TRIG, CHOLHDL, LDLDIRECT in the last 72 hours. Thyroid Function Tests: No results for input(s): TSH, T4TOTAL, FREET4, T3FREE, THYROIDAB in the last 72 hours. Anemia Panel: No results for input(s): VITAMINB12, FOLATE, FERRITIN, TIBC, IRON, RETICCTPCT in the last 72 hours. Urine analysis:    Component Value Date/Time   COLORURINE AMBER (A) 11/12/2016 1803   APPEARANCEUR CLOUDY (A) 11/12/2016 1803   LABSPEC 1.015 11/12/2016 1803   PHURINE 6.0 11/12/2016 1803   GLUCOSEU 50 (A) 11/12/2016 1803   HGBUR MODERATE (A) 11/12/2016 1803   BILIRUBINUR NEGATIVE 11/12/2016 1803   KETONESUR 5 (A) 11/12/2016 1803   PROTEINUR 100 (A) 11/12/2016 1803   NITRITE NEGATIVE 11/12/2016 1803   LEUKOCYTESUR TRACE (A) 11/12/2016 1803   Sepsis Labs: @LABRCNTIP (procalcitonin:4,lacticidven:4) )No results found for this or any previous visit (from the past 240 hour(s)).   Radiological Exams on Admission: Ct Abdomen Pelvis W Contrast  Result Date: 11/12/2016 CLINICAL DATA:  Left lower quadrant pain, nausea, vomiting and diarrhea x5 days. EXAM: CT ABDOMEN AND PELVIS WITH CONTRAST TECHNIQUE: Multidetector CT imaging of the abdomen and pelvis was performed using the standard protocol following bolus administration of intravenous contrast. CONTRAST:  171mL ISOVUE-300 IOPAMIDOL (ISOVUE-300) INJECTION 61% COMPARISON:  10/12/2014 CT FINDINGS: Lower chest: Right atrial and right ventricular pacing  leads noted. Normal size cardiac chambers. Atelectasis at the right lung base posteriorly. No significant pleural effusion. No pneumothorax. Small hiatal hernia. Hepatobiliary: Scattered hepatic calcifications consistent with old granulomatous disease. No biliary dilatation. Gallbladder is free of stones. Pancreas: No acute abnormality. No space-occupying mass or ductal dilatation. Spleen: No splenomegaly. Numerous scattered splenic calcifications consistent with old granulomatous disease. Adrenals/Urinary Tract: No adrenal mass. No obstructive uropathy. Mild perinephric fat stranding on the right. Stable bilateral renal hypodensities consistent statistically with cysts the largest is in the left lower pole measuring 18 mm in diameter. Urinary bladder is contracted. Stomach/Bowel: Hypodensity within the mesenteric side of the sigmoid colon consistent with an intramural abscess measuring approximately 12 mm in diameter possibly from diverticulitis. Numerous fluid-filled mild-to-moderately distended small bowel loops are noted to the level of the terminal ileum likely from a sympathetic ileus. Vascular/Lymphatic: Aortoiliac atherosclerosis. No aneurysm or dissection. Small subcentimeter mesenteric lymph nodes. Reproductive: Enlarged prostate up to 5.6 x 5.1 x 5.7 cm (volume = 85 cm^3). Other: Small amount of ascites overlies the dome of the liver and is seen in the pelvis. Musculoskeletal: No acute or significant osseous findings. IMPRESSION: 1. 12 mm hypodensity within the wall of the sigmoid colon suspicious for an intramural abscess in the setting of oral diverticulitis. Sympathetic fluid-filled distention of small bowel consistent with ileus. 2. Hepatic and splenic calcifications consistent with old granulomatous disease. 3. Stable bilateral renal hypodensities consistent with renal cysts. 4. Prostatic enlargement. 5. Right basilar atelectasis. Electronically Signed   By: Ashley Royalty M.D.   On: 11/12/2016 21:18      Assessment/Plan Principal Problem:   Sepsis ([place]) Active Problems:   Hyperlipidemia   Peripheral vascular disease (HCC)   Chronic combined systolic and diastolic heart failure (HCC)   Essential hypertension   Abscess of sigmoid colon due to diverticulitis    1. Sepsis from sigmoid abscess with possible ileus - appreciate surgery consult. Description is Nothing by mouth and on IV fluids and Zosyn. Since patient has been recently on antibiotics for Mohs surgery stool studies have been ordered if patient has further diarrhea. 2. Acute renal failure probably  from hypertension and recent vomiting and diarrhea - continue hydration and follow metabolic panel. 3. Hypertension - since patient is nothing by mouth patient is on when necessary IV hydralazine. Will keep patient on scheduled IV metoprolol with holding orders. 4. Peripheral vascular disease status post stenting - on Plavix and statins which is on hold at this time due to nothing by mouth.    DVT prophylaxis: SCDs. Code Status: Full code.  Family Communication: Patient's wife.  Disposition Plan: Home.  Consults called: General surgery.  Admission status: Inpatient.    Rise Patience MD [facility] Pager [phone].  If 7PM-7AM, please contact night-coverage [email or web address] Password [email or web address]  11/13/2016, 12:05 AM

## 2016-11-13 NOTE — Progress Notes (Signed)
[facility] OF CARE NOTE Patient: Nicholas Herrera [id number]   PCP: Lilian Coma, MD DOB: 06/18/1941   DOA: 11/12/2016   DOS: 11/13/2016    Patient was admitted by my colleague Dr. Hal Hope  earlier on 11/13/2016. I have reviewed the H&P as well as assessment and plan and agree with the same. Important changes in the plan are listed below.  Plan of care: Principal Problem:   Sepsis ([place]) Active Problems:   Hyperlipidemia   Peripheral vascular disease ([place])   Chronic combined systolic and diastolic heart failure (HCC)   Essential hypertension   Abscess of sigmoid colon due to diverticulitis Continue IV antibiotics at present. Recommend patient ambulate with assistance every 4 hours.  Author: Berle Mull, MD [facility] Pager: [phone] 11/13/2016 2:43 PM   If 7PM-7AM, please contact night-coverage at [email or web address], password [facility]

## 2016-11-14 LAB — CBC WITH DIFFERENTIAL/PLATELET
Basophils Absolute: 0 10*3/uL (ref 0.0–0.1)
Basophils Relative: 0 %
Eosinophils Absolute: 0 10*3/uL (ref 0.0–0.7)
Eosinophils Relative: 0 %
HCT: 35.5 % — ABNORMAL LOW (ref 39.0–52.0)
Hemoglobin: 12.2 g/dL — ABNORMAL LOW (ref 13.0–17.0)
Lymphocytes Relative: 7 %
Lymphs Abs: 1.4 10*3/uL (ref 0.7–4.0)
MCH: 31.8 pg (ref 26.0–34.0)
MCHC: 34.4 g/dL (ref 30.0–36.0)
MCV: 92.4 fL (ref 78.0–100.0)
Monocytes Absolute: 1.3 10*3/uL — ABNORMAL HIGH (ref 0.1–1.0)
Monocytes Relative: 6 %
Neutro Abs: 18.1 10*3/uL — ABNORMAL HIGH (ref 1.7–7.7)
Neutrophils Relative %: 87 %
Platelets: 256 10*3/uL (ref 150–400)
RBC: 3.84 MIL/uL — ABNORMAL LOW (ref 4.22–5.81)
RDW: 13.4 % (ref 11.5–15.5)
WBC: 20.9 10*3/uL — ABNORMAL HIGH (ref 4.0–10.5)

## 2016-11-14 LAB — COMPREHENSIVE METABOLIC PANEL
ALT: 24 U/L (ref 17–63)
AST: 22 U/L (ref 15–41)
Albumin: 2.2 g/dL — ABNORMAL LOW (ref 3.5–5.0)
Alkaline Phosphatase: 213 U/L — ABNORMAL HIGH (ref 38–126)
Anion gap: 10 (ref 5–15)
BUN: 6 mg/dL (ref 6–20)
CO2: 22 mmol/L (ref 22–32)
Calcium: 8.2 mg/dL — ABNORMAL LOW (ref 8.9–10.3)
Chloride: 104 mmol/L (ref 101–111)
Creatinine, Ser: 0.75 mg/dL (ref 0.61–1.24)
GFR calc Af Amer: >60 mL/min (ref >60)
GFR calc non Af Amer: >60 mL/min (ref >60)
Glucose, Bld: 141 mg/dL — ABNORMAL HIGH (ref 65–99)
Potassium: 3.5 mmol/L (ref 3.5–5.1)
Sodium: 136 mmol/L (ref 135–145)
Total Bilirubin: 0.7 mg/dL (ref 0.3–1.2)
Total Protein: 5.8 g/dL — ABNORMAL LOW (ref 6.5–8.1)

## 2016-11-14 LAB — MAGNESIUM: Magnesium: 1.7 mg/dL (ref 1.7–2.4)

## 2016-11-14 LAB — GLUCOSE, CAPILLARY
Glucose-Capillary: 113 mg/dL — ABNORMAL HIGH (ref 65–99)
Glucose-Capillary: 114 mg/dL — ABNORMAL HIGH (ref 65–99)
Glucose-Capillary: 139 mg/dL — ABNORMAL HIGH (ref 65–99)
Glucose-Capillary: 142 mg/dL — ABNORMAL HIGH (ref 65–99)

## 2016-11-14 MED ORDER — DIPHENHYDRAMINE HCL 50 MG/ML IJ SOLN
12.5000 mg | Freq: Once | INTRAMUSCULAR | Status: AC
  Administered 2016-11-14: 12.5 mg via INTRAVENOUS
  Filled 2016-11-14: qty 1

## 2016-11-14 MED ORDER — ENOXAPARIN SODIUM 40 MG/0.4ML ~~LOC~~ SOLN
40.0000 mg | SUBCUTANEOUS | Status: DC
Start: 2016-11-14 — End: 2016-11-20
  Administered 2016-11-14 – 2016-11-19 (×6): 40 mg via SUBCUTANEOUS
  Filled 2016-11-14 (×6): qty 0.4

## 2016-11-14 MED ORDER — ZOLPIDEM TARTRATE 5 MG PO TABS
5.0000 mg | ORAL_TABLET | Freq: Every evening | ORAL | Status: DC | PRN
  Administered 2016-11-14 – 2016-11-19 (×6): 5 mg via ORAL
  Filled 2016-11-14 (×6): qty 1

## 2016-11-14 MED ORDER — NITROGLYCERIN 2 % TD OINT
0.5000 [in_us] | TOPICAL_OINTMENT | Freq: Four times a day (QID) | TRANSDERMAL | Status: DC
  Administered 2016-11-14 – 2016-11-15 (×4): 0.5 [in_us] via TOPICAL
  Filled 2016-11-14 (×2): qty 30

## 2016-11-14 NOTE — Progress Notes (Signed)
Nicholas Herrera2 F (36.8 C) (01/17 1417) Temp Source: Oral (01/17 1417) BP: 168/88 (01/17 1417) Pulse Rate: 66 (01/17 1417)  Labs:  Recent Labs  11/12/16 1748 11/13/16 0444 11/14/16 0551  HGB 13.3 11.1* 12.2*  HCT 38.2* 32.8* 35.5*  PLT 239 236 256  CREATININE 1.32* 1.01 0.75    Estimated Creatinine Clearance: 67.6 mL/min (by C-G formula based on SCr of 0.75 mg/dL).   Medical History: Past Medical History:  Diagnosis Date  . Abdominal distention   . Abdominal pain   . Bronchitis    hx of  . Cancer ([place])    skin  . CHF (congestive heart failure) (HCC)     (hx of EF 27%) s/p AICD 2004. Most recent LVEF > 40% , 8/11  . Chronic kidney disease    has small stones, no treatment  . Cough   . Depression    takes paxil  . Diverticulosis    hx of  . Dysrhythmia   . Easy bruising   . Hematuria    hx of  . Hernia october 2012   [facility]  . HTN (hypertension)   . Hyperlipidemia    takes zocor  . Inguinal hernia   . Left groin pain     Medications:  Scheduled:  . metoprolol  5 mg Intravenous Q6H  . nitroGLYCERIN  0.5 inch Topical Q6H  . piperacillin-tazobactam (ZOSYN)  IV  3.375 g Intravenous Q8H    Assessment: 76 y.o male admitted on 11/12/2016, with complaint of abdominal pain and hypotension with diarrhea, was found to have sigmoid diverticulitis with abscess. Acute renal failure, SCr nomalized  With IVF rehydration Patient has refused SCDs .Pharmacy consulted to start Lovenox for VTE prophylaxis. Surgery notes that if patient does not show signs of clinical improvement in next 24 hours, surgical intervention may be warranted. In which case,  lovenox will need to be held prior to surgery.   weight 67.9 kg, CrCl ~ 67 ml/min Pltc 256k, H/H 12.2/35.5 low/stable, no bleeding reported.    Goal of Therapy:  VTE prophylaxis    Plan:  Lovenox 40 mg SQ q24h  Pharmacy will sign off. Please re-consult pharmacy if further assistance needed. If surgical intervention warranted , Lovenox will need to held prior to surgery.   Nicole Cella, RPh Clinical Pharmacist Pager: [phone] 11/14/2016,2:31 PM

## 2016-11-14 NOTE — Progress Notes (Signed)
[facility] Progress Note     Subjective: Reports abdominal pain is stable, not better or worse, compared to yesterday. Endorses increased abdominal discomfort/fullness and belching. Denies flatus or BM. Denies fever, chills, nausea, or vomiting.   Objective: Vital signs in last 24 hours: Temp:  [98.2 F (36.8 C)-98.3 F (36.8 C)] 98.2 F (36.8 C) (01/17 0525) Pulse Rate:  [72-80] 80 (01/17 0541) Resp:  [18] 18 (01/17 0525) BP: (161-180)/(72-88) 163/75 (01/17 0741) SpO2:  [94 %-95 %] 95 % (01/17 0525) Last BM Date: 11/11/16  Intake/Output from previous day: 01/16 0701 - 01/17 0700 In: 1270 [I.V.:855; IV Piggyback:415] Out: 300 [Urine:300]  PE: Gen:  Alert, NAD, pleasant Pulm:  Unlabored Abd: soft, moderate distention, non-tender, hypoactive BS, no guarding or peritonitis Ext:  No erythema, edema, or tenderness   Lab Results:   Recent Labs  11/13/16 0444 11/14/16 0551  WBC 19.5* 20.9*  HGB 11.1* 12.2*  HCT 32.8* 35.5*  PLT 236 256   BMET  Recent Labs  11/13/16 0444 11/14/16 0551  NA 136 136  K 3.5 3.5  CL 104 104  CO2 23 22  GLUCOSE 108* 141*  BUN 13 6  CREATININE 1.01 0.75  CALCIUM 8.0* 8.2*   CMP     Component Value Date/Time   NA 136 11/14/2016 0551   K 3.5 11/14/2016 0551   CL 104 11/14/2016 0551   CO2 22 11/14/2016 0551   GLUCOSE 141 (H) 11/14/2016 0551   BUN 6 11/14/2016 0551   CREATININE 0.75 11/14/2016 0551   CALCIUM 8.2 (L) 11/14/2016 0551   PROT 5.8 (L) 11/14/2016 0551   ALBUMIN 2.2 (L) 11/14/2016 0551   AST 22 11/14/2016 0551   ALT 24 11/14/2016 0551   ALKPHOS 213 (H) 11/14/2016 0551   BILITOT 0.7 11/14/2016 0551   GFRNONAA >60 11/14/2016 0551   GFRAA >60 11/14/2016 0551   Lipase     Component Value Date/Time   LIPASE 15 11/12/2016 1748   Anti-infectives: Anti-infectives    Start     Dose/Rate Route Frequency Ordered Stop   11/13/16 0130  piperacillin-tazobactam (ZOSYN) IVPB 3.375 g     3.375 g 12.5 mL/hr  over 240 Minutes Intravenous Every 8 hours 11/12/16 1942     11/12/16 1830  piperacillin-tazobactam (ZOSYN) IVPB 3.375 g     3.375 g 100 mL/hr over 30 Minutes Intravenous  Once 11/12/16 1819 11/12/16 2027     Assessment/Plan Sigmoid diverticulitis with small intramural abscess - c.diff negative - WBC 20.9 from 19.5  - continue NPO, IVF   ARF - SCr normalized (1.01) w/ IVF rehydration HTN - PRN hydralazine  PVD s/p right popliteal stent - Plavix held 1/15  FEN: NPO, IVF ID: Zosyn 1/15 >> VTE: SCD's  Dispo: inflammatory ileus. Patient with increasing abdominal distention, absence of flatus/BM, and increasing leukocytosis. Recommend continuation of IV antibiotics and NPO. Patient denies nausea or vomiting at this time. Abdomen is distended but soft. Nasogastric decompression would be recommended for increased distention, nausea, or vomiting.  Will discuss surgical plan with MD - if patient does not show signs of clinical improvement in next 24 hours, surgical intervention may be warranted vs. Repeat CT scan to re-evaluate fluid collection and switching antibiotics to IV Invanz.    LOS: 2 days     Nicholas Herrera , [facility] 11/14/2016, 7:49 AM Pager: [phone] Consults: [phone] Mon-Fri 7:00 am-4:30 pm Sat-Sun 7:00 am-11:30 am

## 2016-11-14 NOTE — Progress Notes (Signed)
[facility] Progress Note  Patient: Nicholas Herrera [id number]   PCP: Lilian Coma, MD DOB: 1940-11-05   DOA: 11/12/2016   DOS: 11/14/2016   Date of Service: the patient was seen and examined on 11/14/2016  Brief hospital course: Pt. with PMH of HTN, PVD; admitted on 11/12/2016, with complaint of abdominal pain and hypotension with diarrhea, was found to have sigmoid diverticulitis with abscess. Currently further plan is continue IV antibiotics and conservative management.  Assessment and Plan: 1. Sepsis ([place]) Sigmoid diverticulitis with abscess. Patient presented with abdominal pain and diarrhea. Currently still has some abdominal pain as bizarre of vomiting this morning as well. Not passing gas at present. CT scan also shows presence of ileus. Patient was started on IV Zosyn. Leukocytosis is mildly worsened. Appreciate neurosurgery input. Patient may require change of antibiotics or imaging and potential surgical intervention if he does not improve in next 24 hours.  2. Essential hypertension. Continue with Lopressor 5 mg every 6 hours scheduled. Use when necessary IV hydralazine. Add nitroglycerin ointment. Holding off on oral medication as the patient remains nothing by mouth at present.  3. Mood disorder. Currently the vital the patient is remaining nothing by mouth we will hold off on his home medications.  Bowel regimen: last BM prior to admission  Diet: NPO DVT Prophylaxis: subcutaneous Heparin  Advance goals of care discussion: full code  Family Communication: no family was present at bedside, at the time of interview.    Disposition:  Discharge to home. Expected discharge date: 11/18/2016, diet tolerance and improvement in symptoms  Consultants: general surgery  Procedures: none  Antibiotics: Anti-infectives    Start     Dose/Rate Route Frequency Ordered Stop   11/13/16 0130  piperacillin-tazobactam (ZOSYN) IVPB 3.375 g     3.375 g 12.5 mL/hr  over 240 Minutes Intravenous Every 8 hours 11/12/16 1942     11/12/16 1830  piperacillin-tazobactam (ZOSYN) IVPB 3.375 g     3.375 g 100 mL/hr over 30 Minutes Intravenous  Once 11/12/16 1819 11/12/16 2027        Subjective: An episode of vomiting this morning, not passing gas.  Objective: Physical Exam: Vitals:   11/14/16 0525 11/14/16 0541 11/14/16 0741 11/14/16 1312  BP: (!) 171/75 (!) 180/88 (!) 163/75 (!) 178/68  Pulse: 72 80  65  Resp: 18     Temp: 98.2 F (36.8 C)     TempSrc:      SpO2: 95%     Weight:      Height:        Intake/Output Summary (Last 24 hours) at 11/14/16 1413 Last data filed at 11/14/16 1148  Gross per 24 hour  Intake             1220 ml  Output              600 ml  Net              620 ml   Filed Weights   11/12/16 1654 11/13/16 0026  Weight: 66.7 kg (147 lb) 67.9 kg (149 lb 9.6 oz)    General: Alert, Awake and Oriented to Time, Place and Person. Appear in mild distress, affect appropriate Eyes: PERRL, Conjunctiva normal ENT: Oral Mucosa clear moist. Neck: no JVD, no Abnormal Mass Or lumps Cardiovascular: S1 and S2 Present, no Murmur, Respiratory: Bilateral Air entry equal and Decreased, no use of accessory muscle, Clear to Auscultation, no Crackles, no wheezes Abdomen: Bowel Sound present, Soft and no  tenderness Skin: no redness, no Rash, no induration Extremities: no Pedal edema, no calf tenderness Neurologic: Grossly no focal neuro deficit. Bilaterally Equal motor strength  Data Reviewed: CBC:  Recent Labs Lab 11/12/16 1748 11/13/16 0444 11/14/16 0551  WBC 25.5* 19.5* 20.9*  NEUTROABS 22.4* 16.5* 18.1*  HGB 13.3 11.1* 12.2*  HCT 38.2* 32.8* 35.5*  MCV 92.3 92.4 92.4  PLT 239 236 [id number]   Basic Metabolic Panel:  Recent Labs Lab 11/12/16 1748 11/13/16 0444 11/14/16 0551  NA 131* 136 136  K 4.6 3.5 3.5  CL 96* 104 104  CO2 24 23 22   GLUCOSE 125* 108* 141*  BUN 14 13 6   CREATININE 1.32* 1.01 0.75  CALCIUM 8.1* 8.0* 8.2*   MG  --   --  1.7    Liver Function Tests:  Recent Labs Lab 11/12/16 1748 11/13/16 0444 11/14/16 0551  AST 25 18 22   ALT 32 24 24  ALKPHOS 198* 154* 213*  BILITOT 0.9 0.6 0.7  PROT 5.7* 5.0* 5.8*  ALBUMIN 2.3* 2.3* 2.2*    Recent Labs Lab 11/12/16 1748  LIPASE 15   No results for input(s): AMMONIA in the last 168 hours. Coagulation Profile: No results for input(s): INR, PROTIME in the last 168 hours. Cardiac Enzymes: No results for input(s): CKTOTAL, CKMB, CKMBINDEX, TROPONINI in the last 168 hours. BNP (last 3 results) No results for input(s): PROBNP in the last 8760 hours.  CBG:  Recent Labs Lab 11/13/16 1153 11/13/16 1752 11/14/16 0024 11/14/16 0523 11/14/16 1147  GLUCAP 129* 116* 139* 142* 114*    Studies: No results found.   Scheduled Meds: . metoprolol  5 mg Intravenous Q6H  . piperacillin-tazobactam (ZOSYN)  IV  3.375 g Intravenous Q8H   Continuous Infusions: PRN Meds: acetaminophen **OR** acetaminophen, hydrALAZINE, ondansetron **OR** ondansetron (ZOFRAN) IV, zolpidem  Time spent: 30 minutes  Author: Berle Mull, MD [facility] Pager: [phone] 11/14/2016 2:13 PM  If 7PM-7AM, please contact night-coverage at [email or web address], password [facility]

## 2016-11-15 DIAGNOSIS — K5720 Diverticulitis of large intestine with perforation and abscess without bleeding: Principal | ICD-10-CM

## 2016-11-15 LAB — CBC WITH DIFFERENTIAL/PLATELET
Basophils Absolute: 0 10*3/uL (ref 0.0–0.1)
Basophils Relative: 0 %
Eosinophils Absolute: 0.1 10*3/uL (ref 0.0–0.7)
Eosinophils Relative: 1 %
HCT: 36.5 % — ABNORMAL LOW (ref 39.0–52.0)
Hemoglobin: 12.5 g/dL — ABNORMAL LOW (ref 13.0–17.0)
Lymphocytes Relative: 8 %
Lymphs Abs: 1.6 10*3/uL (ref 0.7–4.0)
MCH: 31.5 pg (ref 26.0–34.0)
MCHC: 34.2 g/dL (ref 30.0–36.0)
MCV: 91.9 fL (ref 78.0–100.0)
Monocytes Absolute: 1.3 10*3/uL — ABNORMAL HIGH (ref 0.1–1.0)
Monocytes Relative: 7 %
Neutro Abs: 15.8 10*3/uL — ABNORMAL HIGH (ref 1.7–7.7)
Neutrophils Relative %: 84 %
Platelets: 331 10*3/uL (ref 150–400)
RBC: 3.97 MIL/uL — ABNORMAL LOW (ref 4.22–5.81)
RDW: 13.6 % (ref 11.5–15.5)
WBC: 18.8 10*3/uL — ABNORMAL HIGH (ref 4.0–10.5)

## 2016-11-15 LAB — COMPREHENSIVE METABOLIC PANEL
ALT: 31 U/L (ref 17–63)
AST: 34 U/L (ref 15–41)
Albumin: 2.2 g/dL — ABNORMAL LOW (ref 3.5–5.0)
Alkaline Phosphatase: 212 U/L — ABNORMAL HIGH (ref 38–126)
Anion gap: 11 (ref 5–15)
BUN: 7 mg/dL (ref 6–20)
CO2: 24 mmol/L (ref 22–32)
Calcium: 8.3 mg/dL — ABNORMAL LOW (ref 8.9–10.3)
Chloride: 103 mmol/L (ref 101–111)
Creatinine, Ser: 0.8 mg/dL (ref 0.61–1.24)
GFR calc Af Amer: >60 mL/min (ref >60)
GFR calc non Af Amer: >60 mL/min (ref >60)
Glucose, Bld: 107 mg/dL — ABNORMAL HIGH (ref 65–99)
Potassium: 3.3 mmol/L — ABNORMAL LOW (ref 3.5–5.1)
Sodium: 138 mmol/L (ref 135–145)
Total Bilirubin: 0.8 mg/dL (ref 0.3–1.2)
Total Protein: 5.6 g/dL — ABNORMAL LOW (ref 6.5–8.1)

## 2016-11-15 LAB — MAGNESIUM: Magnesium: 1.7 mg/dL (ref 1.7–2.4)

## 2016-11-15 LAB — GLUCOSE, CAPILLARY
Glucose-Capillary: 115 mg/dL — ABNORMAL HIGH (ref 65–99)
Glucose-Capillary: 122 mg/dL — ABNORMAL HIGH (ref 65–99)
Glucose-Capillary: 125 mg/dL — ABNORMAL HIGH (ref 65–99)
Glucose-Capillary: 167 mg/dL — ABNORMAL HIGH (ref 65–99)

## 2016-11-15 MED ORDER — MIRABEGRON ER 50 MG PO TB24
50.0000 mg | ORAL_TABLET | Freq: Every day | ORAL | Status: DC
  Administered 2016-11-15 – 2016-11-20 (×6): 50 mg via ORAL
  Filled 2016-11-15 (×6): qty 1

## 2016-11-15 MED ORDER — SODIUM CHLORIDE 0.9 % IV SOLN
30.0000 meq | Freq: Once | INTRAVENOUS | Status: AC
  Administered 2016-11-15: 30 meq via INTRAVENOUS
  Filled 2016-11-15: qty 15

## 2016-11-15 MED ORDER — PAROXETINE HCL 20 MG PO TABS
40.0000 mg | ORAL_TABLET | Freq: Every day | ORAL | Status: DC
  Administered 2016-11-15 – 2016-11-20 (×6): 40 mg via ORAL
  Filled 2016-11-15 (×6): qty 2

## 2016-11-15 MED ORDER — VENLAFAXINE HCL ER 37.5 MG PO CP24
37.5000 mg | ORAL_CAPSULE | Freq: Two times a day (BID) | ORAL | Status: DC
  Administered 2016-11-15 – 2016-11-20 (×10): 37.5 mg via ORAL
  Filled 2016-11-15 (×10): qty 1

## 2016-11-15 MED ORDER — CARVEDILOL 6.25 MG PO TABS
18.7500 mg | ORAL_TABLET | Freq: Two times a day (BID) | ORAL | Status: DC
  Administered 2016-11-15 – 2016-11-20 (×10): 18.75 mg via ORAL
  Filled 2016-11-15 (×10): qty 1

## 2016-11-15 NOTE — Progress Notes (Signed)
Patient ID: Nicholas Herrera, male   DOB: 01-22-1941, 76 y.o.   MRN: [id number]  [facility] Surgery Progress Note     Subjective: Patient states that yesterday afternoon he started to feel better. He reports very little lower abdominal pain. Some persistent abdominal distension, but improved from yesterday. He is passing flatus and had a large BM this morning. Tolerating clears. Feels weak.  Objective: Vital signs in last 24 hours: Temp:  [98.2 F (36.8 C)-99.4 F (37.4 C)] 98.3 F (36.8 C) (01/18 0500) Pulse Rate:  [65-71] 71 (01/18 0500) Resp:  [18-20] 18 (01/18 0500) BP: (162-178)/(68-88) 162/79 (01/18 0500) SpO2:  [92 %-93 %] 92 % (01/18 0500) Last BM Date: 11/11/16  Intake/Output from previous day: 01/17 0701 - 01/18 0700 In: 0  Out: 400 [Urine:400] Intake/Output this shift: Total I/O In: -  Out: 400 [Urine:400]  PE: Gen: Alert, NAD, pleasant Pulm: CTAB, effort normal Abd: soft, mild distention, non-tender, +BS, no guarding or peritonitis Ext: No erythema, edema, or tenderness   Lab Results:   Recent Labs  11/13/16 0444 11/14/16 0551  WBC 19.5* 20.9*  HGB 11.1* 12.2*  HCT 32.8* 35.5*  PLT 236 256   BMET  Recent Labs  11/14/16 0551 11/15/16 0627  NA 136 138  K 3.5 3.3*  CL 104 103  CO2 22 24  GLUCOSE 141* 107*  BUN 6 7  CREATININE 0.75 0.80  CALCIUM 8.2* 8.3*   PT/INR No results for input(s): LABPROT, INR in the last 72 hours. CMP     Component Value Date/Time   NA 138 11/15/2016 0627   K 3.3 (L) 11/15/2016 0627   CL 103 11/15/2016 0627   CO2 24 11/15/2016 0627   GLUCOSE 107 (H) 11/15/2016 0627   BUN 7 11/15/2016 0627   CREATININE 0.80 11/15/2016 0627   CALCIUM 8.3 (L) 11/15/2016 0627   PROT 5.6 (L) 11/15/2016 0627   ALBUMIN 2.2 (L) 11/15/2016 0627   AST 34 11/15/2016 0627   ALT 31 11/15/2016 0627   ALKPHOS 212 (H) 11/15/2016 0627   BILITOT 0.8 11/15/2016 0627   GFRNONAA >60 11/15/2016 0627   GFRAA >60 11/15/2016 0627    Lipase     Component Value Date/Time   LIPASE 15 11/12/2016 1748       Studies/Results: No results found.  Anti-infectives: Anti-infectives    Start     Dose/Rate Route Frequency Ordered Stop   11/13/16 0130  piperacillin-tazobactam (ZOSYN) IVPB 3.375 g     3.375 g 12.5 mL/hr over 240 Minutes Intravenous Every 8 hours 11/12/16 1942     11/12/16 1830  piperacillin-tazobactam (ZOSYN) IVPB 3.375 g     3.375 g 100 mL/hr over 30 Minutes Intravenous  Once 11/12/16 1819 11/12/16 2027       Assessment/Plan Sigmoid diverticulitis with small intramural abscess - c.diff negative - WBC 18.8 from 20.9, TMAX 99.4 over night - tolerating clear liquids - inflammatory ileus resolved.   ARF - SCr normalized (0.8) w/ IVF rehydration HTN - PRN hydralazine  PVD s/p right popliteal stent - Plavix held 1/15  FEN: IVF, full liquids ID: Zosyn 1/15 >> VTE: SCD's  Dispo: Ileus resolving. WBC trending down. Advance to full liquids. Encourage mobilization.   LOS: 3 days    Jerrye Beavers , [facility] 11/15/2016, 8:37 AM Pager: [phone] Consults: [phone] Mon-Fri 7:00 am-4:30 pm Sat-Sun 7:00 am-11:30 am

## 2016-11-15 NOTE — Progress Notes (Signed)
[facility] Progress Note  Patient: Nicholas Herrera [id number]   PCP: Lilian Coma, MD DOB: 08-Apr-1941   DOA: 11/12/2016   DOS: 11/15/2016   Date of Service: the patient was seen and examined on 11/15/2016  Brief hospital course: Pt. with PMH of HTN, PVD; admitted on 11/12/2016, with complaint of abdominal pain and hypotension with diarrhea, was found to have sigmoid diverticulitis with abscess. Currently further plan is continue IV antibiotics and conservative management.  Assessment and Plan: 1. Sigmoid diverticulitis with abscess. Sepsis ruled out. Patient presented with abdominal pain and diarrhea. passing gas at present. CT scan also shows presence of ileus. Patient was started on IV Zosyn. Leukocytosis is mildly better today. Appreciate general surgery input.  2. Essential hypertension. Stop IV Lopressor. Start home meds, hold ACE inhibitos. Use when necessary IV hydralazine. Stop nitroglycerin ointment.  3. Mood disorder. Resume home medication  Bowel regimen: last BM 11/15/2016 Diet: full liquid diet DVT Prophylaxis: subcutaneous Heparin  Advance goals of care discussion: full code  Family Communication: no family was present at bedside, at the time of interview.    Disposition:  Discharge to home. Expected discharge date: 11/18/2016, diet tolerance and improvement in symptoms  Consultants: general surgery  Procedures: none  Antibiotics: Anti-infectives    Start     Dose/Rate Route Frequency Ordered Stop   11/13/16 0130  piperacillin-tazobactam (ZOSYN) IVPB 3.375 g     3.375 g 12.5 mL/hr over 240 Minutes Intravenous Every 8 hours 11/12/16 1942     11/12/16 1830  piperacillin-tazobactam (ZOSYN) IVPB 3.375 g     3.375 g 100 mL/hr over 30 Minutes Intravenous  Once 11/12/16 1819 11/12/16 2027     Subjective: no acute complains, tolerating oral diet  Objective: Physical Exam: Vitals:   11/14/16 1800 11/15/16 0002 11/15/16 0500 11/15/16 1430    BP: (!) 170/82 (!) 171/80 (!) 162/79 (!) 158/81  Pulse: 70 69 71 74  Resp:  18 18 16   Temp:  99.4 F (37.4 C) 98.3 F (36.8 C) 99.6 F (37.6 C)  TempSrc:    Oral  SpO2:  93% 92% 91%  Weight:      Height:        Intake/Output Summary (Last 24 hours) at 11/15/16 1605 Last data filed at 11/15/16 0835  Gross per 24 hour  Intake                0 ml  Output              500 ml  Net             -500 ml   Filed Weights   11/12/16 1654 11/13/16 0026  Weight: 66.7 kg (147 lb) 67.9 kg (149 lb 9.6 oz)    General: Alert, Awake and Oriented to Time, Place and Person. Appear in mild distress, affect appropriate Eyes: PERRL, Conjunctiva normal ENT: Oral Mucosa clear moist. Neck: no JVD, no Abnormal Mass Or lumps Cardiovascular: S1 and S2 Present, no Murmur, Respiratory: Bilateral Air entry equal and Decreased, no use of accessory muscle, Clear to Auscultation, no Crackles, no wheezes Abdomen: Bowel Sound present, Soft and no tenderness Skin: no redness, no Rash, no induration Extremities: no Pedal edema, no calf tenderness Neurologic: Grossly no focal neuro deficit. Bilaterally Equal motor strength  Data Reviewed: CBC:  Recent Labs Lab 11/12/16 1748 11/13/16 0444 11/14/16 0551 11/15/16 0627  WBC 25.5* 19.5* 20.9* 18.8*  NEUTROABS 22.4* 16.5* 18.1* 15.8*  HGB 13.3 11.1* 12.2* 12.5*  HCT 38.2* 32.8* 35.5* 36.5*  MCV 92.3 92.4 92.4 91.9  PLT 239 236 256 [id number]   Basic Metabolic Panel:  Recent Labs Lab 11/12/16 1748 11/13/16 0444 11/14/16 0551 11/15/16 0627  NA 131* 136 136 138  K 4.6 3.5 3.5 3.3*  CL 96* 104 104 103  CO2 24 23 22 24   GLUCOSE 125* 108* 141* 107*  BUN 14 13 6 7   CREATININE 1.32* 1.01 0.75 0.80  CALCIUM 8.1* 8.0* 8.2* 8.3*  MG  --   --  1.7 1.7    Liver Function Tests:  Recent Labs Lab 11/12/16 1748 11/13/16 0444 11/14/16 0551 11/15/16 0627  AST 25 18 22  34  ALT 32 24 24 31   ALKPHOS 198* 154* 213* 212*  BILITOT 0.9 0.6 0.7 0.8  PROT 5.7*  5.0* 5.8* 5.6*  ALBUMIN 2.3* 2.3* 2.2* 2.2*    Recent Labs Lab 11/12/16 1748  LIPASE 15   No results for input(s): AMMONIA in the last 168 hours. Coagulation Profile: No results for input(s): INR, PROTIME in the last 168 hours. Cardiac Enzymes: No results for input(s): CKTOTAL, CKMB, CKMBINDEX, TROPONINI in the last 168 hours. BNP (last 3 results) No results for input(s): PROBNP in the last 8760 hours.  CBG:  Recent Labs Lab 11/14/16 1147 11/14/16 1718 11/15/16 0001 11/15/16 0459 11/15/16 1208  GLUCAP 114* 113* 125* 115* 122*    Studies: No results found.   Scheduled Meds: . enoxaparin (LOVENOX) injection  40 mg Subcutaneous Q24H  . metoprolol  5 mg Intravenous Q6H  . nitroGLYCERIN  0.5 inch Topical Q6H  . piperacillin-tazobactam (ZOSYN)  IV  3.375 g Intravenous Q8H   Continuous Infusions: PRN Meds: acetaminophen **OR** acetaminophen, hydrALAZINE, ondansetron **OR** ondansetron (ZOFRAN) IV, zolpidem  Time spent: 30 minutes  Author: Berle Mull, MD [facility] Pager: [phone] 11/15/2016 4:05 PM  If 7PM-7AM, please contact night-coverage at [email or web address], password [facility]

## 2016-11-15 NOTE — Progress Notes (Signed)
Pharmacy Antibiotic Note  Nicholas Herrera is a 76 y.o. male admitted on 11/12/2016 with intra-abdominal infection; sigmoid diverticulitis with small intramural abscess.  Pharmacy has been consulted for Zosyn dosing.  Day # 4 Zosyn. Tmax 99.6, WBC down to 18.8. Noted improved.  Plan:  Continue Zosyn 3.375 gm IV q8hrs (each over 4 hours).  No dosage adjustment anticipated.  Will follow final culture data, clinical progress, length of therapy.  Height: 5' 1.32" (155.8 cm) Weight: 149 lb 9.6 oz (67.9 kg) IBW/kg (Calculated) : 53.04  Temp (24hrs), Avg:99.1 F (37.3 C), Min:98.3 F (36.8 C), Max:99.6 F (37.6 C)   Recent Labs Lab 11/12/16 1748 11/12/16 1809 11/13/16 0444 11/14/16 0551 11/15/16 0627  WBC 25.5*  --  19.5* 20.9* 18.8*  CREATININE 1.32*  --  1.01 0.75 0.80  LATICACIDVEN  --  1.28  --   --   --     Estimated Creatinine Clearance: 67.6 mL/min (by C-G formula based on SCr of 0.8 mg/dL).    Allergies  Allergen Reactions  . Sulfa Antibiotics Other (See Comments)    UNKNOWN    Antimicrobials this admission:  Zosyn 1/15>>  Dose adjustments this admission:  n/a  Microbiology results:  1/15 C diff - negative  1/15 blood x 2 - no growth x 3 days to date  1/15 urine - negative  Thank you for allowing pharmacy to be a part of this patient's care.  Arty Baumgartner, [place] Pager: [phone] 11/15/2016 3:40 PM

## 2016-11-15 NOTE — Care Management Note (Addendum)
Case Management Note  Patient Details  Name: Nicholas Herrera MRN: [id number] Date of Birth: 01-Jul-1941  Subjective/Objective:                 Patient from home with wife. Admitted with sigmoid colon abscess, IV Abx, advancing diet to full liquid today.    Action/Plan:  CM to continue to follow for DC needs. 1-23 DC to home with wife.    Expected Discharge Date:  11/16/16               Expected Discharge Plan:  Home/Self Care  In-House Referral:     Discharge planning Services  CM Consult  Post Acute Care Choice:    Choice offered to:     DME Arranged:    DME Agency:     HH Arranged:    HH Agency:     Status of Service:  In process, will continue to follow  If discussed at Long Length of Stay Meetings, dates discussed:    Additional Comments:  Carles Collet, RN 11/15/2016, 12:02 PM

## 2016-11-16 LAB — BASIC METABOLIC PANEL
Anion gap: 9 (ref 5–15)
BUN: 8 mg/dL (ref 6–20)
CO2: 24 mmol/L (ref 22–32)
Calcium: 8.2 mg/dL — ABNORMAL LOW (ref 8.9–10.3)
Chloride: 102 mmol/L (ref 101–111)
Creatinine, Ser: 0.74 mg/dL (ref 0.61–1.24)
GFR calc Af Amer: >60 mL/min (ref >60)
GFR calc non Af Amer: >60 mL/min (ref >60)
Glucose, Bld: 108 mg/dL — ABNORMAL HIGH (ref 65–99)
Potassium: 3.3 mmol/L — ABNORMAL LOW (ref 3.5–5.1)
Sodium: 135 mmol/L (ref 135–145)

## 2016-11-16 LAB — CBC
HCT: 34.7 % — ABNORMAL LOW (ref 39.0–52.0)
Hemoglobin: 11.8 g/dL — ABNORMAL LOW (ref 13.0–17.0)
MCH: 31.2 pg (ref 26.0–34.0)
MCHC: 34 g/dL (ref 30.0–36.0)
MCV: 91.8 fL (ref 78.0–100.0)
Platelets: 347 10*3/uL (ref 150–400)
RBC: 3.78 MIL/uL — ABNORMAL LOW (ref 4.22–5.81)
RDW: 13.4 % (ref 11.5–15.5)
WBC: 17 10*3/uL — ABNORMAL HIGH (ref 4.0–10.5)

## 2016-11-16 LAB — GLUCOSE, CAPILLARY
Glucose-Capillary: 107 mg/dL — ABNORMAL HIGH (ref 65–99)
Glucose-Capillary: 113 mg/dL — ABNORMAL HIGH (ref 65–99)
Glucose-Capillary: 114 mg/dL — ABNORMAL HIGH (ref 65–99)
Glucose-Capillary: 130 mg/dL — ABNORMAL HIGH (ref 65–99)
Glucose-Capillary: 141 mg/dL — ABNORMAL HIGH (ref 65–99)

## 2016-11-16 LAB — MAGNESIUM: Magnesium: 1.6 mg/dL — ABNORMAL LOW (ref 1.7–2.4)

## 2016-11-16 MED ORDER — SACCHAROMYCES BOULARDII 250 MG PO CAPS
250.0000 mg | ORAL_CAPSULE | Freq: Two times a day (BID) | ORAL | Status: DC
  Administered 2016-11-16 – 2016-11-20 (×8): 250 mg via ORAL
  Filled 2016-11-16 (×8): qty 1

## 2016-11-16 NOTE — Progress Notes (Signed)
[facility] Progress Note  Patient: LUCUS FAUTEUX [id number]   PCP: Lilian Coma, MD DOB: 02/06/1941   DOA: 11/12/2016   DOS: 11/16/2016   Date of Service: the patient was seen and examined on 11/16/2016  Brief hospital course: Pt. with PMH of HTN, PVD; admitted on 11/12/2016, with complaint of abdominal pain and hypotension with diarrhea, was found to have sigmoid diverticulitis with abscess. Currently further plan is continue IV antibiotics and conservative management.  Assessment and Plan: 1. Sigmoid diverticulitis with abscess. Sepsis ruled out. Patient presented with abdominal pain and diarrhea. passing gas at present. CT scan also shows presence of ileus. Patient was started on IV Zosyn. Leukocytosis is Getting better Appreciate general surgery input. On soft liquid diet at present  2. Essential hypertension. Stop IV Lopressor. Start home meds, hold ACE inhibitos. Use when necessary IV hydralazine. Stop nitroglycerin ointment.  3. Mood disorder. Resume home medication  Bowel regimen: last BM 11/15/2016 Diet: full liquid diet DVT Prophylaxis: subcutaneous Heparin  Advance goals of care discussion: full code  Family Communication: no family was present at bedside, at the time of interview.  Multiple attempts to reach patient's wife have been unsuccessful on the number provided in demographics. Cannot leave any voice mail as well.  Disposition:  Discharge to home. Expected discharge date: 11/18/2016, diet tolerance and improvement in symptoms  Consultants: general surgery  Procedures: none  Antibiotics: Anti-infectives    Start     Dose/Rate Route Frequency Ordered Stop   11/13/16 0130  piperacillin-tazobactam (ZOSYN) IVPB 3.375 g     3.375 g 12.5 mL/hr over 240 Minutes Intravenous Every 8 hours 11/12/16 1942     11/12/16 1830  piperacillin-tazobactam (ZOSYN) IVPB 3.375 g     3.375 g 100 mL/hr over 30 Minutes Intravenous  Once 11/12/16 1819 11/12/16  2027     Subjective: no acute complains, tolerating oral dietNo nausea or vomiting, passing gas did have bowel movements yesterday as well.  Objective: Physical Exam: Vitals:   11/15/16 0500 11/15/16 1430 11/15/16 2123 11/16/16 0528  BP: (!) 162/79 (!) 158/81 (!) 146/83 (!) 158/83  Pulse: 71 74  74  Resp: 18 16  18   Temp: 98.3 F (36.8 C) 99.6 F (37.6 C) 99.3 F (37.4 C) 99.5 F (37.5 C)  TempSrc:  Oral Oral   SpO2: 92% 91%  93%  Weight:      Height:        Intake/Output Summary (Last 24 hours) at 11/16/16 1758 Last data filed at 11/16/16 1439  Gross per 24 hour  Intake                0 ml  Output              800 ml  Net             -800 ml   Filed Weights   11/12/16 1654 11/13/16 0026  Weight: 66.7 kg (147 lb) 67.9 kg (149 lb 9.6 oz)    General: Alert, Awake and Oriented to Time, Place and Person. Appear in mild distress, affect appropriate Eyes: PERRL, Conjunctiva normal ENT: Oral Mucosa clear moist. Neck: no JVD, no Abnormal Mass Or lumps Cardiovascular: S1 and S2 Present, no Murmur, Respiratory: Bilateral Air entry equal and Decreased, no use of accessory muscle, Clear to Auscultation, no Crackles, no wheezes Abdomen: Bowel Sound present, Soft and no tenderness Skin: no redness, no Rash, no induration Extremities: no Pedal edema, no calf tenderness Neurologic: Grossly no focal neuro  deficit. Bilaterally Equal motor strength  Data Reviewed: CBC:  Recent Labs Lab 11/12/16 1748 11/13/16 0444 11/14/16 0551 11/15/16 0627 11/16/16 0547  WBC 25.5* 19.5* 20.9* 18.8* 17.0*  NEUTROABS 22.4* 16.5* 18.1* 15.8*  --   HGB 13.3 11.1* 12.2* 12.5* 11.8*  HCT 38.2* 32.8* 35.5* 36.5* 34.7*  MCV 92.3 92.4 92.4 91.9 91.8  PLT 239 236 256 331 [id number]   Basic Metabolic Panel:  Recent Labs Lab 11/12/16 1748 11/13/16 0444 11/14/16 0551 11/15/16 0627 11/16/16 0547  NA 131* 136 136 138 135  K 4.6 3.5 3.5 3.3* 3.3*  CL 96* 104 104 103 102  CO2 24 23 22 24 24   GLUCOSE  125* 108* 141* 107* 108*  BUN 14 13 6 7 8   CREATININE 1.32* 1.01 0.75 0.80 0.74  CALCIUM 8.1* 8.0* 8.2* 8.3* 8.2*  MG  --   --  1.7 1.7 1.6*    Liver Function Tests:  Recent Labs Lab 11/12/16 1748 11/13/16 0444 11/14/16 0551 11/15/16 0627  AST 25 18 22  34  ALT 32 24 24 31   ALKPHOS 198* 154* 213* 212*  BILITOT 0.9 0.6 0.7 0.8  PROT 5.7* 5.0* 5.8* 5.6*  ALBUMIN 2.3* 2.3* 2.2* 2.2*    Recent Labs Lab 11/12/16 1748  LIPASE 15   No results for input(s): AMMONIA in the last 168 hours. Coagulation Profile: No results for input(s): INR, PROTIME in the last 168 hours. Cardiac Enzymes: No results for input(s): CKTOTAL, CKMB, CKMBINDEX, TROPONINI in the last 168 hours. BNP (last 3 results) No results for input(s): PROBNP in the last 8760 hours.  CBG:  Recent Labs Lab 11/15/16 1922 11/16/16 0008 11/16/16 0608 11/16/16 1250 11/16/16 1740  GLUCAP 167* 130* 114* 113* 141*    Studies: No results found.   Scheduled Meds: . carvedilol  18.75 mg Oral BID WC  . enoxaparin (LOVENOX) injection  40 mg Subcutaneous Q24H  . mirabegron ER  50 mg Oral Daily  . PARoxetine  40 mg Oral Daily  . piperacillin-tazobactam (ZOSYN)  IV  3.375 g Intravenous Q8H  . saccharomyces boulardii  250 mg Oral BID  . venlafaxine XR  37.5 mg Oral BID   Continuous Infusions: PRN Meds: acetaminophen **OR** acetaminophen, hydrALAZINE, ondansetron **OR** ondansetron (ZOFRAN) IV, zolpidem  Time spent: 30 minutes  Author: Berle Mull, MD [facility] Pager: [phone] 11/16/2016 5:58 PM  If 7PM-7AM, please contact night-coverage at [email or web address], password [facility]

## 2016-11-16 NOTE — Progress Notes (Signed)
[facility] Progress Note     Subjective: Denies abdominal pain, nausea, or vomiting. Tolerating full liquids. Reports ~5 small-volume, non-bloody BM's yesterday.  Objective: Vital signs in last 24 hours: Temp:  [99.3 F (37.4 C)-99.6 F (37.6 C)] 99.5 F (37.5 C) (01/19 0528) Pulse Rate:  [74] 74 (01/19 0528) Resp:  [16-18] 18 (01/19 0528) BP: (146-158)/(81-83) 158/83 (01/19 0528) SpO2:  [91 %-93 %] 93 % (01/19 0528) Last BM Date: 11/15/16  Intake/Output from previous day: 01/18 0701 - 01/19 0700 In: -  Out: 1000 [Urine:1000] Intake/Output this shift: No intake/output data recorded.  PE: Gen:  Alert, NAD, pleasant Pulm:  Normal effort Abd: Soft, non-tender, mild distention, +BS in all 4 quadrants, no HSM Ext:  No erythema, edema, or tenderness  Lab Results:   Recent Labs  11/15/16 0627 11/16/16 0547  WBC 18.8* 17.0*  HGB 12.5* 11.8*  HCT 36.5* 34.7*  PLT 331 347   BMET  Recent Labs  11/15/16 0627 11/16/16 0547  NA 138 135  K 3.3* 3.3*  CL 103 102  CO2 24 24  GLUCOSE 107* 108*  BUN 7 8  CREATININE 0.80 0.74  CALCIUM 8.3* 8.2*   PT/INR No results for input(s): LABPROT, INR in the last 72 hours. CMP     Component Value Date/Time   NA 135 11/16/2016 0547   K 3.3 (L) 11/16/2016 0547   CL 102 11/16/2016 0547   CO2 24 11/16/2016 0547   GLUCOSE 108 (H) 11/16/2016 0547   BUN 8 11/16/2016 0547   CREATININE 0.74 11/16/2016 0547   CALCIUM 8.2 (L) 11/16/2016 0547   PROT 5.6 (L) 11/15/2016 0627   ALBUMIN 2.2 (L) 11/15/2016 0627   AST 34 11/15/2016 0627   ALT 31 11/15/2016 0627   ALKPHOS 212 (H) 11/15/2016 0627   BILITOT 0.8 11/15/2016 0627   GFRNONAA >60 11/16/2016 0547   GFRAA >60 11/16/2016 0547   Lipase     Component Value Date/Time   LIPASE 15 11/12/2016 1748   Anti-infectives: Anti-infectives    Start     Dose/Rate Route Frequency Ordered Stop   11/13/16 0130  piperacillin-tazobactam (ZOSYN) IVPB 3.375 g     3.375 g 12.5  mL/hr over 240 Minutes Intravenous Every 8 hours 11/12/16 1942     11/12/16 1830  piperacillin-tazobactam (ZOSYN) IVPB 3.375 g     3.375 g 100 mL/hr over 30 Minutes Intravenous  Once 11/12/16 1819 11/12/16 2027       Assessment/Plan Sigmoid diverticulitis with small intramural abscess - c.diff negative - WBC 17.0 from 18.8, TMAX 99.6 over night - tolerating full liquids - inflammatory ileus resolved.   ARF - SCr normalized (0.8) w/ IVF rehydration HTN - PRN hydralazine  PVD s/p right popliteal stent - Plavix held 1/15  FEN: IVF, full liquids ID: Zosyn 1/15 >> VTE: SCD's  Dispo: benign abdominal exam, WBC trending down, tolerating PO, having bowel function - advance to soft diet. Continue IV abx. May be able to transition to PO abx tomorrow.    LOS: 4 days    Jill Alexanders , [facility] 11/16/2016, 7:32 AM Pager: [phone] Consults: [phone] Mon-Fri 7:00 am-4:30 pm Sat-Sun 7:00 am-11:30 am

## 2016-11-16 NOTE — Care Management Important Message (Signed)
Important Message  Patient Details  Name: Nicholas Herrera MRN: [id number] Date of Birth: 04-08-1941   Medicare Important Message Given:  Yes    Nathen May 11/16/2016, 5:20 PM

## 2016-11-17 LAB — BASIC METABOLIC PANEL
Anion gap: 11 (ref 5–15)
BUN: 8 mg/dL (ref 6–20)
CO2: 23 mmol/L (ref 22–32)
Calcium: 8 mg/dL — ABNORMAL LOW (ref 8.9–10.3)
Chloride: 102 mmol/L (ref 101–111)
Creatinine, Ser: 0.78 mg/dL (ref 0.61–1.24)
GFR calc Af Amer: >60 mL/min (ref >60)
GFR calc non Af Amer: >60 mL/min (ref >60)
Glucose, Bld: 93 mg/dL (ref 65–99)
Potassium: 3.1 mmol/L — ABNORMAL LOW (ref 3.5–5.1)
Sodium: 136 mmol/L (ref 135–145)

## 2016-11-17 LAB — CULTURE, BLOOD (ROUTINE X 2)
Culture: NO GROWTH
Culture: NO GROWTH

## 2016-11-17 LAB — CBC WITH DIFFERENTIAL/PLATELET
Basophils Absolute: 0 10*3/uL (ref 0.0–0.1)
Basophils Relative: 0 %
Eosinophils Absolute: 0.3 10*3/uL (ref 0.0–0.7)
Eosinophils Relative: 1 %
HCT: 35.6 % — ABNORMAL LOW (ref 39.0–52.0)
Hemoglobin: 12.1 g/dL — ABNORMAL LOW (ref 13.0–17.0)
Lymphocytes Relative: 9 %
Lymphs Abs: 1.5 10*3/uL (ref 0.7–4.0)
MCH: 31.2 pg (ref 26.0–34.0)
MCHC: 34 g/dL (ref 30.0–36.0)
MCV: 91.8 fL (ref 78.0–100.0)
Monocytes Absolute: 0.9 10*3/uL (ref 0.1–1.0)
Monocytes Relative: 5 %
Neutro Abs: 14.8 10*3/uL — ABNORMAL HIGH (ref 1.7–7.7)
Neutrophils Relative %: 85 %
Platelets: 355 10*3/uL (ref 150–400)
RBC: 3.88 MIL/uL — ABNORMAL LOW (ref 4.22–5.81)
RDW: 13.3 % (ref 11.5–15.5)
WBC: 17.5 10*3/uL — ABNORMAL HIGH (ref 4.0–10.5)

## 2016-11-17 LAB — GLUCOSE, CAPILLARY
Glucose-Capillary: 93 mg/dL (ref 65–99)
Glucose-Capillary: 109 mg/dL — ABNORMAL HIGH (ref 65–99)
Glucose-Capillary: 108 mg/dL — ABNORMAL HIGH (ref 65–99)

## 2016-11-17 MED ORDER — BENAZEPRIL HCL 20 MG PO TABS
20.0000 mg | ORAL_TABLET | Freq: Every day | ORAL | Status: DC
  Administered 2016-11-17 – 2016-11-20 (×4): 20 mg via ORAL
  Filled 2016-11-17 (×4): qty 1

## 2016-11-17 NOTE — Progress Notes (Signed)
[facility] Progress Note  Patient: Nicholas Herrera [id number]   PCP: Lilian Coma, MD DOB: 03/08/1941   DOA: 11/12/2016   DOS: 11/17/2016   Date of Service: the patient was seen and examined on 11/17/2016  Brief hospital course: Pt. with PMH of HTN, PVD; admitted on 11/12/2016, with complaint of abdominal pain and hypotension with diarrhea, was found to have sigmoid diverticulitis with abscess. Currently further plan is continue IV antibiotics and conservative management.  Assessment and Plan: 1. Sigmoid diverticulitis with abscess. Sepsis ruled out. Patient presented with abdominal pain and diarrhea. Having normal bowel movements CT scan also shows presence of ileus. Patient was started on IV Zosyn. Leukocytosis is Slowly Getting better, diarrhea currently resolved. Appreciate general surgery input. On soft liquid diet at present We'll transition to Augmentin once recommend by surgery. Pt will be on soft diet for 3 weeks.  2. Essential hypertension. Continue home meds, resume ACE inhibitos. Use when necessary IV hydralazine.  3. Mood disorder. Resume home medication  Bowel regimen: last BM 11/16/2016 Diet: Soft diet diet DVT Prophylaxis: subcutaneous Heparin  Advance goals of care discussion: full code  Family Communication: no family was present at bedside, at the time of interview.  Multiple attempts to reach patient's wife have been unsuccessful on the number provided in demographics. Cannot leave any voice mail as well.  Disposition:  Discharge to home. Expected discharge date: 11/18/2016, diet tolerance and improvement in symptoms  Consultants: general surgery  Procedures: none  Antibiotics: Anti-infectives    Start     Dose/Rate Route Frequency Ordered Stop   11/13/16 0130  piperacillin-tazobactam (ZOSYN) IVPB 3.375 g     3.375 g 12.5 mL/hr over 240 Minutes Intravenous Every 8 hours 11/12/16 1942     11/12/16 1830  piperacillin-tazobactam (ZOSYN) IVPB  3.375 g     3.375 g 100 mL/hr over 30 Minutes Intravenous  Once 11/12/16 1819 11/12/16 2027     Subjective: Diarrhea is resolved. Tolerating oral diet.  Objective: Physical Exam: Vitals:   11/16/16 2134 11/17/16 0416 11/17/16 0635 11/17/16 0638  BP: (!) 153/66 (!) 168/74 (!) 169/78 (!) 160/80  Pulse: 68 71 68 70  Resp: 20 18 18 18   Temp: 98.9 F (37.2 C) 99.6 F (37.6 C)    TempSrc:  Oral    SpO2: 92% 92% 93% 93%  Weight:      Height:        Intake/Output Summary (Last 24 hours) at 11/17/16 1319 Last data filed at 11/17/16 0600  Gross per 24 hour  Intake              590 ml  Output              350 ml  Net              240 ml   Filed Weights   11/12/16 1654 11/13/16 0026  Weight: 66.7 kg (147 lb) 67.9 kg (149 lb 9.6 oz)    General: Alert, Awake and Oriented to Time, Place and Person. Appear in mild distress, affect appropriate Eyes: PERRL, Conjunctiva normal ENT: Oral Mucosa clear moist. Neck: no JVD, no Abnormal Mass Or lumps Cardiovascular: S1 and S2 Present, no Murmur, Respiratory: Bilateral Air entry equal and Decreased, no use of accessory muscle, Clear to Auscultation, no Crackles, no wheezes Abdomen: Bowel Sound present, Soft and no tenderness Skin: no redness, no Rash, no induration Extremities: no Pedal edema, no calf tenderness Neurologic: Grossly no focal neuro deficit. Bilaterally Equal motor  strength  Data Reviewed: CBC:  Recent Labs Lab 11/12/16 1748 11/13/16 0444 11/14/16 0551 11/15/16 0627 11/16/16 0547 11/17/16 0914  WBC 25.5* 19.5* 20.9* 18.8* 17.0* 17.5*  NEUTROABS 22.4* 16.5* 18.1* 15.8*  --  14.8*  HGB 13.3 11.1* 12.2* 12.5* 11.8* 12.1*  HCT 38.2* 32.8* 35.5* 36.5* 34.7* 35.6*  MCV 92.3 92.4 92.4 91.9 91.8 91.8  PLT 239 236 256 331 347 [id number]   Basic Metabolic Panel:  Recent Labs Lab 11/13/16 0444 11/14/16 0551 11/15/16 0627 11/16/16 0547 11/17/16 0404  NA 136 136 138 135 136  K 3.5 3.5 3.3* 3.3* 3.1*  CL 104 104 103 102 102   CO2 23 22 24 24 23   GLUCOSE 108* 141* 107* 108* 93  BUN 13 6 7 8 8   CREATININE 1.01 0.75 0.80 0.74 0.78  CALCIUM 8.0* 8.2* 8.3* 8.2* 8.0*  MG  --  1.7 1.7 1.6*  --     Liver Function Tests:  Recent Labs Lab 11/12/16 1748 11/13/16 0444 11/14/16 0551 11/15/16 0627  AST 25 18 22  34  ALT 32 24 24 31   ALKPHOS 198* 154* 213* 212*  BILITOT 0.9 0.6 0.7 0.8  PROT 5.7* 5.0* 5.8* 5.6*  ALBUMIN 2.3* 2.3* 2.2* 2.2*    Recent Labs Lab 11/12/16 1748  LIPASE 15   No results for input(s): AMMONIA in the last 168 hours. Coagulation Profile: No results for input(s): INR, PROTIME in the last 168 hours. Cardiac Enzymes: No results for input(s): CKTOTAL, CKMB, CKMBINDEX, TROPONINI in the last 168 hours. BNP (last 3 results) No results for input(s): PROBNP in the last 8760 hours.  CBG:  Recent Labs Lab 11/16/16 1250 11/16/16 1740 11/16/16 2357 11/17/16 0416 11/17/16 1224  GLUCAP 113* 141* 107* 93 109*    Studies: No results found.   Scheduled Meds: . carvedilol  18.75 mg Oral BID WC  . enoxaparin (LOVENOX) injection  40 mg Subcutaneous Q24H  . mirabegron ER  50 mg Oral Daily  . PARoxetine  40 mg Oral Daily  . piperacillin-tazobactam (ZOSYN)  IV  3.375 g Intravenous Q8H  . saccharomyces boulardii  250 mg Oral BID  . venlafaxine XR  37.5 mg Oral BID   Continuous Infusions: PRN Meds: acetaminophen **OR** acetaminophen, hydrALAZINE, ondansetron **OR** ondansetron (ZOFRAN) IV, zolpidem  Time spent: 30 minutes  Author: Berle Mull, MD [facility] Pager: [phone] 11/17/2016 1:19 PM  If 7PM-7AM, please contact night-coverage at [email or web address], password [facility]

## 2016-11-17 NOTE — Progress Notes (Signed)
[facility] Progress Note     Subjective: Pt is tolerating his diet. No abdominal pain at rest. Pain with coughing. No pain with BM's. Pt had a small BM today without blood. No fever, chills, nausea or vomiting.  Objective: Vital signs in last 24 hours: Temp:  [98.9 F (37.2 C)-99.6 F (37.6 C)] 99.6 F (37.6 C) (01/20 0416) Pulse Rate:  [68-71] 70 (01/20 0638) Resp:  [18-20] 18 (01/20 [id number]) BP: (153-169)/(66-80) 160/80 (01/20 0638) SpO2:  [92 %-93 %] 93 % (01/20 [id number]) Last BM Date: 11/16/16  Intake/Output from previous day: 01/19 0701 - 01/20 0700 In: 590 [P.O.:540; IV Piggyback:50] Out: 350 [Urine:350] Intake/Output this shift: No intake/output data recorded.  PE: Gen:  Alert, NAD, pleasant, cooperative, sitting up in chair eating breatkfast Card:  RRR, no M/G/R heard Pulm:  Rate and effort normal Abd: Soft, non distended, +BS, very mild TTP in RLQ  Skin: no rashes noted, warm and dry  Lab Results:   Recent Labs  11/15/16 0627 11/16/16 0547  WBC 18.8* 17.0*  HGB 12.5* 11.8*  HCT 36.5* 34.7*  PLT 331 347   BMET  Recent Labs  11/16/16 0547 11/17/16 0404  NA 135 136  K 3.3* 3.1*  CL 102 102  CO2 24 23  GLUCOSE 108* 93  BUN 8 8  CREATININE 0.74 0.78  CALCIUM 8.2* 8.0*   PT/INR No results for input(s): LABPROT, INR in the last 72 hours. CMP     Component Value Date/Time   NA 136 11/17/2016 0404   K 3.1 (L) 11/17/2016 0404   CL 102 11/17/2016 0404   CO2 23 11/17/2016 0404   GLUCOSE 93 11/17/2016 0404   BUN 8 11/17/2016 0404   CREATININE 0.78 11/17/2016 0404   CALCIUM 8.0 (L) 11/17/2016 0404   PROT 5.6 (L) 11/15/2016 0627   ALBUMIN 2.2 (L) 11/15/2016 0627   AST 34 11/15/2016 0627   ALT 31 11/15/2016 0627   ALKPHOS 212 (H) 11/15/2016 0627   BILITOT 0.8 11/15/2016 0627   GFRNONAA >60 11/17/2016 0404   GFRAA >60 11/17/2016 0404   Lipase     Component Value Date/Time   LIPASE 15 11/12/2016 1748       Studies/Results: No  results found.  Anti-infectives: Anti-infectives    Start     Dose/Rate Route Frequency Ordered Stop   11/13/16 0130  piperacillin-tazobactam (ZOSYN) IVPB 3.375 g     3.375 g 12.5 mL/hr over 240 Minutes Intravenous Every 8 hours 11/12/16 1942     11/12/16 1830  piperacillin-tazobactam (ZOSYN) IVPB 3.375 g     3.375 g 100 mL/hr over 30 Minutes Intravenous  Once 11/12/16 1819 11/12/16 2027       Assessment/Plan  Sigmoid diverticulitis with small intramural abscess - c.diff negative - WBC trending down, pending AM CBC, TMAX 99.6 over night - tolerating full liquids - inflammatory ileus resolved.   ARF - SCr normalized (0.8) w/ IVF rehydration HTN - PRN hydralazine  PVD s/p right popliteal stent- Plavix held 1/15  FEN: IVF, soft diet ID: Zosyn 1/15 >> VTE: SCD's  Dispo: benign abdominal exam, WBC trending down, tolerating PO, having bowel function - tolerating soft diet. Continue IV abx. May be able to transition to PO abx tomorrow pending WBC. AM CBC pending.  - recommend soft diet for 3 weeks when discharged -continue abx, may transition top PO over the weekend if labs continue to downtrend    LOS: 5 days    Kalman Drape , PA-C  [facility] 11/17/2016, 8:51 AM Pager: [phone] Consults: [phone] Mon-Fri 7:00 am-4:30 pm Sat-Sun 7:00 am-11:30 am

## 2016-11-18 ENCOUNTER — Inpatient Hospital Stay (HOSPITAL_COMMUNITY): Payer: Medicare Other

## 2016-11-18 ENCOUNTER — Encounter (HOSPITAL_COMMUNITY): Payer: Self-pay | Admitting: *Deleted

## 2016-11-18 LAB — BASIC METABOLIC PANEL
Anion gap: 10 (ref 5–15)
BUN: 7 mg/dL (ref 6–20)
CO2: 26 mmol/L (ref 22–32)
Calcium: 7.9 mg/dL — ABNORMAL LOW (ref 8.9–10.3)
Chloride: 100 mmol/L — ABNORMAL LOW (ref 101–111)
Creatinine, Ser: 0.88 mg/dL (ref 0.61–1.24)
GFR calc Af Amer: >60 mL/min (ref >60)
GFR calc non Af Amer: >60 mL/min (ref >60)
Glucose, Bld: 98 mg/dL (ref 65–99)
Potassium: 3.1 mmol/L — ABNORMAL LOW (ref 3.5–5.1)
Sodium: 136 mmol/L (ref 135–145)

## 2016-11-18 LAB — CBC WITH DIFFERENTIAL/PLATELET
Basophils Absolute: 0 10*3/uL (ref 0.0–0.1)
Basophils Relative: 0 %
Eosinophils Absolute: 0.3 10*3/uL (ref 0.0–0.7)
Eosinophils Relative: 2 %
HCT: 34.2 % — ABNORMAL LOW (ref 39.0–52.0)
Hemoglobin: 11.6 g/dL — ABNORMAL LOW (ref 13.0–17.0)
Lymphocytes Relative: 12 %
Lymphs Abs: 2 10*3/uL (ref 0.7–4.0)
MCH: 30.9 pg (ref 26.0–34.0)
MCHC: 33.9 g/dL (ref 30.0–36.0)
MCV: 91.2 fL (ref 78.0–100.0)
Monocytes Absolute: 1.1 10*3/uL — ABNORMAL HIGH (ref 0.1–1.0)
Monocytes Relative: 6 %
Neutro Abs: 13.7 10*3/uL — ABNORMAL HIGH (ref 1.7–7.7)
Neutrophils Relative %: 80 %
Platelets: 401 10*3/uL — ABNORMAL HIGH (ref 150–400)
RBC: 3.75 MIL/uL — ABNORMAL LOW (ref 4.22–5.81)
RDW: 13.1 % (ref 11.5–15.5)
WBC: 17 10*3/uL — ABNORMAL HIGH (ref 4.0–10.5)

## 2016-11-18 LAB — GLUCOSE, CAPILLARY
Glucose-Capillary: 101 mg/dL — ABNORMAL HIGH (ref 65–99)
Glucose-Capillary: 96 mg/dL (ref 65–99)
Glucose-Capillary: 99 mg/dL (ref 65–99)

## 2016-11-18 MED ORDER — POTASSIUM CHLORIDE CRYS ER 20 MEQ PO TBCR
40.0000 meq | EXTENDED_RELEASE_TABLET | ORAL | Status: AC
  Administered 2016-11-18: 40 meq via ORAL
  Filled 2016-11-18 (×2): qty 2

## 2016-11-18 MED ORDER — POTASSIUM CHLORIDE CRYS ER 20 MEQ PO TBCR
40.0000 meq | EXTENDED_RELEASE_TABLET | Freq: Once | ORAL | Status: AC
  Administered 2016-11-18: 40 meq via ORAL
  Filled 2016-11-18: qty 2

## 2016-11-18 MED ORDER — IOPAMIDOL (ISOVUE-300) INJECTION 61%
INTRAVENOUS | Status: AC
  Filled 2016-11-18: qty 100

## 2016-11-18 MED ORDER — IOPAMIDOL (ISOVUE-300) INJECTION 61%
INTRAVENOUS | Status: AC
  Administered 2016-11-18: 100 mL
  Filled 2016-11-18: qty 30

## 2016-11-18 NOTE — Progress Notes (Signed)
[facility] Progress Note     Subjective: Pt tolerating his diet. Still having small BM's. No nausea, vomiting, fever, or chills. New pain in RLQ only with touch. No pain at rest.  Objective: Vital signs in last 24 hours: Temp:  [98 F (36.7 C)-100.3 F (37.9 C)] 99 F (37.2 C) (01/21 0504) Pulse Rate:  [63-68] 65 (01/21 0504) Resp:  [17] 17 (01/21 0504) BP: (131-151)/(63-73) 150/73 (01/21 0504) SpO2:  [92 %-94 %] 92 % (01/21 0504) Last BM Date: 11/17/16  Intake/Output from previous day: No intake/output data recorded. Intake/Output this shift: No intake/output data recorded.  PE: Gen:  Alert, NAD, pleasant, cooperative, sitting up in chair  Card:  RRR, no M/G/R heard Pulm:  Rate and effort normal Abd: Soft, non distended, +BS, moderately tender in the RLQ with guarding  Skin: no rashes noted, warm and dry  Lab Results:   Recent Labs  11/17/16 0914 11/18/16 0440  WBC 17.5* 17.0*  HGB 12.1* 11.6*  HCT 35.6* 34.2*  PLT 355 401*   BMET  Recent Labs  11/17/16 0404 11/18/16 0440  NA 136 136  K 3.1* 3.1*  CL 102 100*  CO2 23 26  GLUCOSE 93 98  BUN 8 7  CREATININE 0.78 0.88  CALCIUM 8.0* 7.9*   PT/INR No results for input(s): LABPROT, INR in the last 72 hours. CMP     Component Value Date/Time   NA 136 11/18/2016 0440   K 3.1 (L) 11/18/2016 0440   CL 100 (L) 11/18/2016 0440   CO2 26 11/18/2016 0440   GLUCOSE 98 11/18/2016 0440   BUN 7 11/18/2016 0440   CREATININE 0.88 11/18/2016 0440   CALCIUM 7.9 (L) 11/18/2016 0440   PROT 5.6 (L) 11/15/2016 0627   ALBUMIN 2.2 (L) 11/15/2016 0627   AST 34 11/15/2016 0627   ALT 31 11/15/2016 0627   ALKPHOS 212 (H) 11/15/2016 0627   BILITOT 0.8 11/15/2016 0627   GFRNONAA >60 11/18/2016 0440   GFRAA >60 11/18/2016 0440   Lipase     Component Value Date/Time   LIPASE 15 11/12/2016 1748       Studies/Results: No results found.  Anti-infectives: Anti-infectives    Start     Dose/Rate Route  Frequency Ordered Stop   11/13/16 0130  piperacillin-tazobactam (ZOSYN) IVPB 3.375 g     3.375 g 12.5 mL/hr over 240 Minutes Intravenous Every 8 hours 11/12/16 1942     11/12/16 1830  piperacillin-tazobactam (ZOSYN) IVPB 3.375 g     3.375 g 100 mL/hr over 30 Minutes Intravenous  Once 11/12/16 1819 11/12/16 2027       Assessment/Plan  Sigmoid diverticulitis with small intramural abscess - c.diff negative - WBC trending down, TMAX 100.3over night - tolerating full liquids - inflammatory ileus resolved.   ARF - SCr normalized (0.8) w/ IVF rehydration HTN - PRN hydralazine  PVD s/p right popliteal stent- Plavix held 1/15  FEN: IVF, soft diet ID: Zosyn 1/15 >> VTE: SCD's  Dispo: worsening tenderness in RLQ from yesterday. CT abd did not comment on appendix. repeat CT scan pending. WBC very slowly trending down, having bowel function - tolerating soft diet. Continue IV abx. Will need to transition to PO abx once WBC is down.   - recommend soft diet for 3 weeks when discharged - continue IV abx, transition top PO once WBC is down more - worsening RLQ abd pain and little improvement in WBC, repeat CT pending, afebrile    LOS: 6 days  Kalman Drape , [facility] 11/18/2016, 8:01 AM Pager: [phone] Consults: [phone] Mon-Fri 7:00 am-4:30 pm Sat-Sun 7:00 am-11:30 am

## 2016-11-18 NOTE — Progress Notes (Signed)
[facility] Progress Note  Patient: Nicholas Herrera [id number]   PCP: Lilian Coma, MD DOB: 12-22-1940   DOA: 11/12/2016   DOS: 11/18/2016   Date of Service: the patient was seen and examined on 11/18/2016  Brief hospital course: Pt. with PMH of HTN, PVD; admitted on 11/12/2016, with complaint of abdominal pain and hypotension with diarrhea, was found to have sigmoid diverticulitis with abscess. Currently further plan is continue IV antibiotics and conservative management.  Assessment and Plan: 1. Sigmoid diverticulitis with abscess. Sepsis ruled out. Patient presented with abdominal pain and diarrhea. Having normal bowel movements CT scan also shows presence of ileus. Patient was started on IV Zosyn. Leukocytosis is stable, diarrhea currently resolved. Appreciate general surgery input. On soft liquid diet at present. Has more abdominal pain on right today, surgery ordered CT abdomen. Will follow.  We'll transition to Augmentin once recommend by surgery. Pt will be on soft diet for 3 weeks.  2. Essential hypertension. Continue home meds, resume ACE inhibitos. Use when necessary IV hydralazine.  3. Mood disorder. Resume home medication  Bowel regimen: last BM 11/17/2016 Diet: Soft diet diet DVT Prophylaxis: subcutaneous Heparin  Advance goals of care discussion: full code  Family Communication: no family was present at bedside, at the time of interview.  Multiple attempts to reach patient's wife have been unsuccessful on the number provided in demographics. Cannot leave any voice mail as well.  Disposition:  Discharge to home. Expected discharge date: 11/20/2016, diet tolerance and improvement in symptoms  Consultants: general surgery  Procedures: none  Antibiotics: Anti-infectives    Start     Dose/Rate Route Frequency Ordered Stop   11/13/16 0130  piperacillin-tazobactam (ZOSYN) IVPB 3.375 g     3.375 g 12.5 mL/hr over 240 Minutes Intravenous Every 8 hours  11/12/16 1942     11/12/16 1830  piperacillin-tazobactam (ZOSYN) IVPB 3.375 g     3.375 g 100 mL/hr over 30 Minutes Intravenous  Once 11/12/16 1819 11/12/16 2027     Subjective: has right sided abdominal pain, no fever no diarrhea  Objective: Physical Exam: Vitals:   11/17/16 2104 11/17/16 2136 11/17/16 2139 11/18/16 0504  BP: 132/63   (!) 150/73  Pulse: 68   65  Resp: 17   17  Temp: 100.3 F (37.9 C) 98.8 F (37.1 C) 98.7 F (37.1 C) 99 F (37.2 C)  TempSrc: Oral Oral Axillary Oral  SpO2: 94%   92%  Weight:      Height:        Intake/Output Summary (Last 24 hours) at 11/18/16 1429 Last data filed at 11/18/16 0924  Gross per 24 hour  Intake              360 ml  Output                0 ml  Net              360 ml   Filed Weights   11/12/16 1654 11/13/16 0026  Weight: 66.7 kg (147 lb) 67.9 kg (149 lb 9.6 oz)    General: Alert, Awake and Oriented to Time, Place and Person. Appear in mild distress, affect appropriate Eyes: PERRL, Conjunctiva normal ENT: Oral Mucosa clear moist. Neck: no JVD, no Abnormal Mass Or lumps Cardiovascular: S1 and S2 Present, no Murmur, Respiratory: Bilateral Air entry equal and Decreased, no use of accessory muscle, Clear to Auscultation, no Crackles, no wheezes Abdomen: Bowel Sound present, Soft and right sided tenderness Skin: no  redness, no Rash, no induration Extremities: no Pedal edema, no calf tenderness Neurologic: Grossly no focal neuro deficit. Bilaterally Equal motor strength  Data Reviewed: CBC:  Recent Labs Lab 11/13/16 0444 11/14/16 0551 11/15/16 0627 11/16/16 0547 11/17/16 0914 11/18/16 0440  WBC 19.5* 20.9* 18.8* 17.0* 17.5* 17.0*  NEUTROABS 16.5* 18.1* 15.8*  --  14.8* 13.7*  HGB 11.1* 12.2* 12.5* 11.8* 12.1* 11.6*  HCT 32.8* 35.5* 36.5* 34.7* 35.6* 34.2*  MCV 92.4 92.4 91.9 91.8 91.8 91.2  PLT 236 256 331 347 355 [id number]*   Basic Metabolic Panel:  Recent Labs Lab 11/14/16 0551 11/15/16 0627 11/16/16 0547  11/17/16 0404 11/18/16 0440  NA 136 138 135 136 136  K 3.5 3.3* 3.3* 3.1* 3.1*  CL 104 103 102 102 100*  CO2 22 24 24 23 26   GLUCOSE 141* 107* 108* 93 98  BUN 6 7 8 8 7   CREATININE 0.75 0.80 0.74 0.78 0.88  CALCIUM 8.2* 8.3* 8.2* 8.0* 7.9*  MG 1.7 1.7 1.6*  --   --     Liver Function Tests:  Recent Labs Lab 11/12/16 1748 11/13/16 0444 11/14/16 0551 11/15/16 0627  AST 25 18 22  34  ALT 32 24 24 31   ALKPHOS 198* 154* 213* 212*  BILITOT 0.9 0.6 0.7 0.8  PROT 5.7* 5.0* 5.8* 5.6*  ALBUMIN 2.3* 2.3* 2.2* 2.2*    Recent Labs Lab 11/12/16 1748  LIPASE 15   No results for input(s): AMMONIA in the last 168 hours. Coagulation Profile: No results for input(s): INR, PROTIME in the last 168 hours. Cardiac Enzymes: No results for input(s): CKTOTAL, CKMB, CKMBINDEX, TROPONINI in the last 168 hours. BNP (last 3 results) No results for input(s): PROBNP in the last 8760 hours.  CBG:  Recent Labs Lab 11/17/16 1224 11/17/16 1807 11/18/16 0018 11/18/16 0505 11/18/16 1244  GLUCAP 109* 108* 99 101* 96    Studies: No results found.   Scheduled Meds: . benazepril  20 mg Oral Daily  . carvedilol  18.75 mg Oral BID WC  . enoxaparin (LOVENOX) injection  40 mg Subcutaneous Q24H  . iopamidol      . mirabegron ER  50 mg Oral Daily  . PARoxetine  40 mg Oral Daily  . piperacillin-tazobactam (ZOSYN)  IV  3.375 g Intravenous Q8H  . saccharomyces boulardii  250 mg Oral BID  . venlafaxine XR  37.5 mg Oral BID   Continuous Infusions: PRN Meds: acetaminophen **OR** acetaminophen, hydrALAZINE, ondansetron **OR** ondansetron (ZOFRAN) IV, zolpidem  Time spent: 30 minutes  Author: Berle Mull, MD [facility] Pager: [phone] 11/18/2016 2:29 PM  If 7PM-7AM, please contact night-coverage at [email or web address], password [facility]

## 2016-11-19 LAB — BASIC METABOLIC PANEL
Anion gap: 10 (ref 5–15)
BUN: 7 mg/dL (ref 6–20)
CO2: 25 mmol/L (ref 22–32)
Calcium: 8.3 mg/dL — ABNORMAL LOW (ref 8.9–10.3)
Chloride: 100 mmol/L — ABNORMAL LOW (ref 101–111)
Creatinine, Ser: 0.9 mg/dL (ref 0.61–1.24)
GFR calc Af Amer: >60 mL/min (ref >60)
GFR calc non Af Amer: >60 mL/min (ref >60)
Glucose, Bld: 97 mg/dL (ref 65–99)
Potassium: 3.8 mmol/L (ref 3.5–5.1)
Sodium: 135 mmol/L (ref 135–145)

## 2016-11-19 LAB — CBC WITH DIFFERENTIAL/PLATELET
Basophils Absolute: 0 10*3/uL (ref 0.0–0.1)
Basophils Relative: 0 %
Eosinophils Absolute: 0.2 10*3/uL (ref 0.0–0.7)
Eosinophils Relative: 1 %
HCT: 39.2 % (ref 39.0–52.0)
Hemoglobin: 13.3 g/dL (ref 13.0–17.0)
Lymphocytes Relative: 14 %
Lymphs Abs: 2.2 10*3/uL (ref 0.7–4.0)
MCH: 31.1 pg (ref 26.0–34.0)
MCHC: 33.9 g/dL (ref 30.0–36.0)
MCV: 91.6 fL (ref 78.0–100.0)
Monocytes Absolute: 1 10*3/uL (ref 0.1–1.0)
Monocytes Relative: 6 %
Neutro Abs: 12.9 10*3/uL — ABNORMAL HIGH (ref 1.7–7.7)
Neutrophils Relative %: 79 %
Platelets: 479 10*3/uL — ABNORMAL HIGH (ref 150–400)
RBC: 4.28 MIL/uL (ref 4.22–5.81)
RDW: 13.1 % (ref 11.5–15.5)
WBC: 16.3 10*3/uL — ABNORMAL HIGH (ref 4.0–10.5)

## 2016-11-19 LAB — GLUCOSE, CAPILLARY
Glucose-Capillary: 100 mg/dL — ABNORMAL HIGH (ref 65–99)
Glucose-Capillary: 101 mg/dL — ABNORMAL HIGH (ref 65–99)
Glucose-Capillary: 178 mg/dL — ABNORMAL HIGH (ref 65–99)
Glucose-Capillary: 98 mg/dL (ref 65–99)

## 2016-11-19 NOTE — Progress Notes (Signed)
Subjective: Pt with no complaints this AM Some increase in RLQ pain.  Objective: Vital signs in last 24 hours: Temp:  [99.3 F (37.4 C)-99.9 F (37.7 C)] 99.3 F (37.4 C) (01/22 0540) Pulse Rate:  [64-65] 64 (01/22 0540) Resp:  [17-18] 18 (01/22 0540) BP: (167-181)/(75-79) 175/79 (01/22 0810) SpO2:  [95 %] 95 % (01/22 0540) Last BM Date: 11/17/16  Intake/Output from previous day: 01/21 0701 - 01/22 0700 In: 1020 [P.O.:720; IV Piggyback:300] Out: 2000 [Urine:2000] Intake/Output this shift: No intake/output data recorded.  General appearance: alert and cooperative GI: soft, min ttp to RLQ, activ eBS  Lab Results:   Recent Labs  11/18/16 0440 11/19/16 0527  WBC 17.0* 16.3*  HGB 11.6* 13.3  HCT 34.2* 39.2  PLT 401* 479*   BMET  Recent Labs  11/18/16 0440 11/19/16 0527  NA 136 135  K 3.1* 3.8  CL 100* 100*  CO2 26 25  GLUCOSE 98 97  BUN 7 7  CREATININE 0.88 0.90  CALCIUM 7.9* 8.3*   PT/INR No results for input(s): LABPROT, INR in the last 72 hours. ABG No results for input(s): PHART, HCO3 in the last 72 hours.  Invalid input(s): PCO2, PO2  Studies/Results: Ct Abdomen Pelvis W Contrast  Result Date: 11/19/2016 CLINICAL DATA:  76 y/o M; right-sided abdominal pain. History of diverticulitis. EXAM: CT ABDOMEN AND PELVIS WITH CONTRAST TECHNIQUE: Multidetector CT imaging of the abdomen and pelvis was performed using the standard protocol following bolus administration of intravenous contrast. CONTRAST:  166mL ISOVUE-300 IOPAMIDOL (ISOVUE-300) INJECTION 61% COMPARISON:  11/12/2016 CT abdomen and pelvis. FINDINGS: Lower chest: Small bilateral pleural effusions. Left upper lobe calcified granuloma. Stable heart size. No pericardial effusion. Severe coronary artery calcifications. Hepatobiliary: Scattered calcifications in the liver likely representing sequelae of prior granulomatous disease. No focal liver lesion identified. Mild wall thickening of the bladder  possibly due to the presence of ascites. No intra or extrahepatic biliary ductal dilatation. Pancreas: Unremarkable. No pancreatic ductal dilatation or surrounding inflammatory changes. Spleen: Small size spleen with multiple calcifications likely representing sequelae of prior granulomatous disease. Adrenals/Urinary Tract: Multiple renal cysts bilaterally the largest in the left lower pole are stable. Normal adrenal glands. No hydronephrosis. Mildly distended bladder. Stomach/Bowel: Diffusely dilated small bowel likely representing ileus. Fluid collection within the wall of sigmoid colon in setting of severe diverticulitis minimally increased in size measuring 14 mm (series 201, image 74). 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 The fistulas best appreciated on the coronal series (series 203 images 62 -71). Vascular/Lymphatic: Aortic atherosclerosis with severe calcifications of the infrarenal segment. Prominent retroperitoneal and mediastinal lymph nodes are likely reactive. Reproductive: Enlarged prostate. Other: Small volume of ascites is increased from prior study. No hernia. Musculoskeletal: No acute osseous abnormality. IMPRESSION: 1. Sigmoid diverticulitis and stable diffuse small bowel dilatation likely representing ileus. 2. Fluid collection within the wall of sigmoid colon in the lower abdomen is mildly increased in size likely representing an abscess. 3. 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 4. Small volume of ascites is mildly increased. 5. Wall thickening of the gallbladder is probably due to the presence of ascites, correlation with liver function tests is recommended. 6. Small bilateral pleural effusions. Electronically Signed   By: Kristine Garbe M.D.   On: 11/19/2016 05:03     Anti-infectives:  Anti-infectives    Start     Dose/Rate Route Frequency Ordered Stop   11/13/16 0130  piperacillin-tazobactam (ZOSYN) IVPB 3.375 g     3.375 g 12.5 mL/hr over 240 Minutes Intravenous Every 8 hours 11/12/16 1942     11/12/16 1830  piperacillin-tazobactam (ZOSYN) IVPB 3.375 g     3.375 g 100 mL/hr over 30 Minutes Intravenous  Once 11/12/16 1819 11/12/16 2027      Assessment/Plan: Sigmoid diverticulitis with small intramural abscess - inflammatory ileus resolved.  -some increased pain to RLQ -IR not able to perc access abscess -OK for Soft diet  -WBC improving, will con't to monitor clinically  ARF - SCr normalized (0.8) w/ IVF rehydration HTN - PRN hydralazine  PVD s/p right popliteal stent- Plavix held 1/15  FEN: IVF, soft diet ID: Zosyn 1/15 >> VTE: SCD's  Dispo: If abd pain con't to progress may require surgical washout vs. Hartman's proc   LOS: 7 days    Rosario Jacks., [facility] 11/19/2016

## 2016-11-19 NOTE — Care Management Important Message (Signed)
Important Message  Patient Details  Name: Nicholas Herrera MRN: [id number] Date of Birth: 1941-03-12   Medicare Important Message Given:  Yes    Nathen May 11/19/2016, 1:54 PM

## 2016-11-19 NOTE — Progress Notes (Signed)
[facility] Progress Note  Patient: Nicholas Herrera [id number]   PCP: Lilian Coma, MD DOB: Mar 09, 1941   DOA: 11/12/2016   DOS: 11/19/2016   Date of Service: the patient was seen and examined on 11/19/2016  Brief hospital course: Pt. with PMH of HTN, PVD; admitted on 11/12/2016, with complaint of abdominal pain and hypotension with diarrhea, was found to have sigmoid diverticulitis with abscess. Currently further plan is continue IV antibiotics and conservative management.  Assessment and Plan: 1. Sigmoid diverticulitis with abscess. Sepsis ruled out. Patient presented with abdominal pain and diarrhea. Having normal bowel movements CT scan also shows presence of ileus. Patient was started on IV Zosyn. Leukocytosis is stable, diarrhea currently resolved. Appreciate general surgery input. On soft liquid diet at present. Has more abdominal pain on right today, surgery ordered CT abdomen.  Repeat CT on 11/18/2016 shows sigmoid diverticulitis, with abscess and ileus. 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 Since patient clinically is getting better surgery recommends to resume soft diet and continue with the IV Zosyn. If no significant improvement tomorrow consideration of surgical intervention and broadening of the antibiotic coverage will be done.  2. Essential hypertension. Continue home meds, resume ACE inhibitos. Use when necessary IV hydralazine.  3. Mood disorder. Resume home medication  Bowel regimen: last BM 11/17/2016 Diet: Soft diet diet DVT Prophylaxis: subcutaneous Heparin  Advance goals of care discussion: full code  Family Communication: Wife was present at bedside, at the time of interview.  Number and demographics updated. All questions answered satisfactorily.  Disposition:  Discharge to home. Expected discharge date: 11/21/2016, diet tolerance and improvement in symptoms  Consultants: general surgery    Procedures: none  Antibiotics: Anti-infectives    Start     Dose/Rate Route Frequency Ordered Stop   11/13/16 0130  piperacillin-tazobactam (ZOSYN) IVPB 3.375 g     3.375 g 12.5 mL/hr over 240 Minutes Intravenous Every 8 hours 11/12/16 1942     11/12/16 1830  piperacillin-tazobactam (ZOSYN) IVPB 3.375 g     3.375 g 100 mL/hr over 30 Minutes Intravenous  Once 11/12/16 1819 11/12/16 2027     Subjective: Pain has improved. No fever no chills no nausea.  Objective: Physical Exam: Vitals:   11/18/16 2137 11/19/16 0540 11/19/16 0810 11/19/16 1436  BP: (!) 167/79 (!) 181/75 (!) 175/79 139/62  Pulse: 65 64  65  Resp: 18 18  18   Temp: 99.7 F (37.6 C) 99.3 F (37.4 C)  99.3 F (37.4 C)  TempSrc: Oral Oral  Oral  SpO2: 95% 95%  94%  Weight:      Height:        Intake/Output Summary (Last 24 hours) at 11/19/16 1725 Last data filed at 11/19/16 1706  Gross per 24 hour  Intake             1000 ml  Output             2350 ml  Net            -1350 ml   Filed Weights   11/12/16 1654 11/13/16 0026  Weight: 66.7 kg (147 lb) 67.9 kg (149 lb 9.6 oz)    General: Alert, Awake and Oriented to Time, Place and Person. Appear in mild distress, affect appropriate Eyes: PERRL, Conjunctiva normal ENT: Oral Mucosa clear moist. Neck: no JVD, no Abnormal Mass Or lumps Cardiovascular: S1 and S2 Present, no Murmur, Respiratory: Bilateral Air entry equal and Decreased, no use of  accessory muscle, Clear to Auscultation, no Crackles, no wheezes Abdomen: Bowel Sound present, Soft and right sided tenderness Skin: no redness, no Rash, no induration Extremities: no Pedal edema, no calf tenderness Neurologic: Grossly no focal neuro deficit. Bilaterally Equal motor strength  Data Reviewed: CBC:  Recent Labs Lab 11/14/16 0551 11/15/16 0627 11/16/16 0547 11/17/16 0914 11/18/16 0440 11/19/16 0527  WBC 20.9* 18.8* 17.0* 17.5* 17.0* 16.3*  NEUTROABS 18.1* 15.8*  --  14.8* 13.7* 12.9*  HGB 12.2*  12.5* 11.8* 12.1* 11.6* 13.3  HCT 35.5* 36.5* 34.7* 35.6* 34.2* 39.2  MCV 92.4 91.9 91.8 91.8 91.2 91.6  PLT 256 331 347 355 401* [id number]*   Basic Metabolic Panel:  Recent Labs Lab 11/14/16 0551 11/15/16 0627 11/16/16 0547 11/17/16 0404 11/18/16 0440 11/19/16 0527  NA 136 138 135 136 136 135  K 3.5 3.3* 3.3* 3.1* 3.1* 3.8  CL 104 103 102 102 100* 100*  CO2 22 24 24 23 26 25   GLUCOSE 141* 107* 108* 93 98 97  BUN 6 7 8 8 7 7   CREATININE 0.75 0.80 0.74 0.78 0.88 0.90  CALCIUM 8.2* 8.3* 8.2* 8.0* 7.9* 8.3*  MG 1.7 1.7 1.6*  --   --   --     Liver Function Tests:  Recent Labs Lab 11/12/16 1748 11/13/16 0444 11/14/16 0551 11/15/16 0627  AST 25 18 22  34  ALT 32 24 24 31   ALKPHOS 198* 154* 213* 212*  BILITOT 0.9 0.6 0.7 0.8  PROT 5.7* 5.0* 5.8* 5.6*  ALBUMIN 2.3* 2.3* 2.2* 2.2*    Recent Labs Lab 11/12/16 1748  LIPASE 15   No results for input(s): AMMONIA in the last 168 hours. Coagulation Profile: No results for input(s): INR, PROTIME in the last 168 hours. Cardiac Enzymes: No results for input(s): CKTOTAL, CKMB, CKMBINDEX, TROPONINI in the last 168 hours. BNP (last 3 results) No results for input(s): PROBNP in the last 8760 hours.  CBG:  Recent Labs Lab 11/18/16 1244 11/19/16 0052 11/19/16 0541 11/19/16 1240 11/19/16 1709  GLUCAP 96 100* 101* 178* 98    Studies: Ct Abdomen Pelvis W Contrast  Result Date: 11/19/2016 CLINICAL DATA:  76 y/o M; right-sided abdominal pain. History of diverticulitis. EXAM: CT ABDOMEN AND PELVIS WITH CONTRAST TECHNIQUE: Multidetector CT imaging of the abdomen and pelvis was performed using the standard protocol following bolus administration of intravenous contrast. CONTRAST:  192mL ISOVUE-300 IOPAMIDOL (ISOVUE-300) INJECTION 61% COMPARISON:  11/12/2016 CT abdomen and pelvis. FINDINGS: Lower chest: Small bilateral pleural effusions. Left upper lobe calcified granuloma. Stable heart size. No pericardial effusion. Severe coronary  artery calcifications. Hepatobiliary: Scattered calcifications in the liver likely representing sequelae of prior granulomatous disease. No focal liver lesion identified. Mild wall thickening of the bladder possibly due to the presence of ascites. No intra or extrahepatic biliary ductal dilatation. Pancreas: Unremarkable. No pancreatic ductal dilatation or surrounding inflammatory changes. Spleen: Small size spleen with multiple calcifications likely representing sequelae of prior granulomatous disease. Adrenals/Urinary Tract: Multiple renal cysts bilaterally the largest in the left lower pole are stable. Normal adrenal glands. No hydronephrosis. Mildly distended bladder. Stomach/Bowel: Diffusely dilated small bowel likely representing ileus. Fluid collection within the wall of sigmoid colon in setting of severe diverticulitis minimally increased in size measuring 14 mm (series 201, image 74). 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 The fistulas best appreciated on the coronal series (series 203 images  62 -71). Vascular/Lymphatic: Aortic atherosclerosis with severe calcifications of the infrarenal segment. Prominent retroperitoneal and mediastinal lymph nodes are likely reactive. Reproductive: Enlarged prostate. Other: Small volume of ascites is increased from prior study. No hernia. Musculoskeletal: No acute osseous abnormality. IMPRESSION: 1. Sigmoid diverticulitis and stable diffuse small bowel dilatation likely representing ileus. 2. Fluid collection within the wall of sigmoid colon in the lower abdomen is mildly increased in size likely representing an abscess. 3. 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 4. Small volume of ascites is mildly increased. 5. Wall thickening of the gallbladder is probably due to the  presence of ascites, correlation with liver function tests is recommended. 6. Small bilateral pleural effusions. Electronically Signed   By: Kristine Garbe M.D.   On: 11/19/2016 05:03     Scheduled Meds: . benazepril  20 mg Oral Daily  . carvedilol  18.75 mg Oral BID WC  . enoxaparin (LOVENOX) injection  40 mg Subcutaneous Q24H  . mirabegron ER  50 mg Oral Daily  . PARoxetine  40 mg Oral Daily  . piperacillin-tazobactam (ZOSYN)  IV  3.375 g Intravenous Q8H  . saccharomyces boulardii  250 mg Oral BID  . venlafaxine XR  37.5 mg Oral BID   Continuous Infusions: PRN Meds: acetaminophen **OR** acetaminophen, hydrALAZINE, ondansetron **OR** ondansetron (ZOFRAN) IV, zolpidem  Time spent: 30 minutes  Author: Berle Mull, MD [facility] Pager: [phone] 11/19/2016 5:25 PM  If 7PM-7AM, please contact night-coverage at [email or web address], password [facility]

## 2016-11-20 LAB — CBC WITH DIFFERENTIAL/PLATELET
Basophils Absolute: 0 10*3/uL (ref 0.0–0.1)
Basophils Relative: 0 %
Eosinophils Absolute: 0.2 10*3/uL (ref 0.0–0.7)
Eosinophils Relative: 1 %
HCT: 39.4 % (ref 39.0–52.0)
Hemoglobin: 13.4 g/dL (ref 13.0–17.0)
Lymphocytes Relative: 16 %
Lymphs Abs: 2.3 10*3/uL (ref 0.7–4.0)
MCH: 31.2 pg (ref 26.0–34.0)
MCHC: 34 g/dL (ref 30.0–36.0)
MCV: 91.6 fL (ref 78.0–100.0)
Monocytes Absolute: 0.7 10*3/uL (ref 0.1–1.0)
Monocytes Relative: 5 %
Neutro Abs: 11 10*3/uL — ABNORMAL HIGH (ref 1.7–7.7)
Neutrophils Relative %: 78 %
Platelets: 488 10*3/uL — ABNORMAL HIGH (ref 150–400)
RBC: 4.3 MIL/uL (ref 4.22–5.81)
RDW: 13.2 % (ref 11.5–15.5)
WBC: 14.2 10*3/uL — ABNORMAL HIGH (ref 4.0–10.5)

## 2016-11-20 LAB — BASIC METABOLIC PANEL
Anion gap: 10 (ref 5–15)
BUN: 8 mg/dL (ref 6–20)
CO2: 26 mmol/L (ref 22–32)
Calcium: 8.5 mg/dL — ABNORMAL LOW (ref 8.9–10.3)
Chloride: 99 mmol/L — ABNORMAL LOW (ref 101–111)
Creatinine, Ser: 0.94 mg/dL (ref 0.61–1.24)
GFR calc Af Amer: >60 mL/min (ref >60)
GFR calc non Af Amer: >60 mL/min (ref >60)
Glucose, Bld: 97 mg/dL (ref 65–99)
Potassium: 3.9 mmol/L (ref 3.5–5.1)
Sodium: 135 mmol/L (ref 135–145)

## 2016-11-20 LAB — GLUCOSE, CAPILLARY: Glucose-Capillary: 102 mg/dL — ABNORMAL HIGH (ref 65–99)

## 2016-11-20 MED ORDER — SACCHAROMYCES BOULARDII 250 MG PO CAPS: 250.0000 mg | ORAL_CAPSULE | Freq: Two times a day (BID) | ORAL | 0 refills | Status: DC

## 2016-11-20 MED ORDER — AMOXICILLIN-POT CLAVULANATE 500-125 MG PO TABS: 1.0000 | ORAL_TABLET | Freq: Three times a day (TID) | ORAL | 0 refills | Status: DC

## 2016-11-20 MED ORDER — SACCHAROMYCES BOULARDII 250 MG PO CAPS
250.0000 mg | ORAL_CAPSULE | Freq: Two times a day (BID) | ORAL | 0 refills | Status: DC
Start: 2016-11-20 — End: 2016-11-20

## 2016-11-20 MED ORDER — AMOXICILLIN-POT CLAVULANATE 500-125 MG PO TABS: 1.0000 | ORAL_TABLET | Freq: Three times a day (TID) | ORAL | 0 refills | Status: AC

## 2016-11-20 NOTE — Progress Notes (Signed)
Subjective: PT con't to tol PO with no abd pain  Objective: Vital signs in last 24 hours: Temp:  [98.4 F (36.9 C)-99.3 F (37.4 C)] 98.9 F (37.2 C) (01/23 0411) Pulse Rate:  [65-75] 75 (01/23 0411) Resp:  [17-18] 18 (01/23 0411) BP: (139-180)/(62-83) 143/75 (01/23 0411) SpO2:  [92 %-96 %] 92 % (01/23 0411) Last BM Date: 11/19/16  Intake/Output from previous day: 01/22 0701 - 01/23 0700 In: 340 [P.O.:340] Out: 1150 [Urine:1150] Intake/Output this shift: No intake/output data recorded.  General appearance: alert and cooperative GI: soft, non-tender; bowel sounds normal; no masses,  no organomegaly  Lab Results:   Recent Labs  11/19/16 0527 11/20/16 0602  WBC 16.3* 14.2*  HGB 13.3 13.4  HCT 39.2 39.4  PLT 479* 488*   BMET  Recent Labs  11/19/16 0527 11/20/16 0602  NA 135 135  K 3.8 3.9  CL 100* 99*  CO2 25 26  GLUCOSE 97 97  BUN 7 8  CREATININE 0.90 0.94  CALCIUM 8.3* 8.5*   PT/INR No results for input(s): LABPROT, INR in the last 72 hours. ABG No results for input(s): PHART, HCO3 in the last 72 hours.  Invalid input(s): PCO2, PO2  Studies/Results: Ct Abdomen Pelvis W Contrast  Result Date: 11/19/2016 CLINICAL DATA:  76 y/o M; right-sided abdominal pain. History of diverticulitis. EXAM: CT ABDOMEN AND PELVIS WITH CONTRAST TECHNIQUE: Multidetector CT imaging of the abdomen and pelvis was performed using the standard protocol following bolus administration of intravenous contrast. CONTRAST:  162mL ISOVUE-300 IOPAMIDOL (ISOVUE-300) INJECTION 61% COMPARISON:  11/12/2016 CT abdomen and pelvis. FINDINGS: Lower chest: Small bilateral pleural effusions. Left upper lobe calcified granuloma. Stable heart size. No pericardial effusion. Severe coronary artery calcifications. Hepatobiliary: Scattered calcifications in the liver likely representing sequelae of prior granulomatous disease. No focal liver lesion identified. Mild wall thickening of the bladder possibly  due to the presence of ascites. No intra or extrahepatic biliary ductal dilatation. Pancreas: Unremarkable. No pancreatic ductal dilatation or surrounding inflammatory changes. Spleen: Small size spleen with multiple calcifications likely representing sequelae of prior granulomatous disease. Adrenals/Urinary Tract: Multiple renal cysts bilaterally the largest in the left lower pole are stable. Normal adrenal glands. No hydronephrosis. Mildly distended bladder. Stomach/Bowel: Diffusely dilated small bowel likely representing ileus. Fluid collection within the wall of sigmoid colon in setting of severe diverticulitis minimally increased in size measuring 14 mm (series 201, image 74). 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 The fistulas best appreciated on the coronal series (series 203 images 62 -71). Vascular/Lymphatic: Aortic atherosclerosis with severe calcifications of the infrarenal segment. Prominent retroperitoneal and mediastinal lymph nodes are likely reactive. Reproductive: Enlarged prostate. Other: Small volume of ascites is increased from prior study. No hernia. Musculoskeletal: No acute osseous abnormality. IMPRESSION: 1. Sigmoid diverticulitis and stable diffuse small bowel dilatation likely representing ileus. 2. Fluid collection within the wall of sigmoid colon in the lower abdomen is mildly increased in size likely representing an abscess. 3. 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 4. Small volume of ascites is mildly increased. 5. Wall thickening of the gallbladder is probably due to the presence of ascites, correlation with liver function tests is recommended. 6. Small bilateral pleural effusions. Electronically Signed   By: Kristine Garbe M.D.   On: 11/19/2016 05:03     Anti-infectives:  Anti-infectives    Start     Dose/Rate Route Frequency Ordered Stop   11/13/16 0130  piperacillin-tazobactam (ZOSYN) IVPB 3.375 g     3.375 g 12.5 mL/hr over 240 Minutes Intravenous Every 8 hours 11/12/16 1942     11/12/16 1830  piperacillin-tazobactam (ZOSYN) IVPB 3.375 g     3.375 g 100 mL/hr over 30 Minutes Intravenous  Once 11/12/16 1819 11/12/16 2027      Assessment/Plan: Sigmoid diverticulitis with small intramural abscess - inflammatory ileus resolved.  --WBC cont improving ARF - SCr normalized (0.8) w/ IVF rehydration HTN - PRN hydralazine  PVD s/p right popliteal stent- Plavix held 1/15  FEN: IVF, soft diet ID: Zosyn 1/15 >> VTE: SCD's  Dispo: Pain stable and pt with no pain with PO.  OK for DC home from our standpoint with abx, cipro/flagyl.  Will need f/u with PCP and GI for c-scope in 6-8 weeks.    Advance diet  LOS: 8 days    Rosario Jacks., Anne Hahn 11/20/2016

## 2016-11-24 NOTE — Discharge Summary (Addendum)
[facility] Discharge Summary   Patient: Nicholas Herrera [id number]   PCP: Lilian Coma, MD DOB: [birth year]   Date of admission: 11/12/2016   Date of discharge: 11/20/2016    Discharge Diagnoses:  Principal Problem:   Abscess of sigmoid colon due to diverticulitis Active Problems:   Hyperlipidemia   Depression   Peripheral vascular disease ([place])   Chronic combined systolic and diastolic heart failure ([place])   Essential hypertension   Admitted From: HOME Disposition:  HOME  Recommendations for Outpatient Follow-up:  1. Please follow up with PCP in 1 week   Follow-up [place], MD. Schedule an appointment as soon as possible for a visit on 11/27/2016.   Specialty:  Family Medicine Why:  Appointment is on 11/28/15 at [facility] information: [address] [place] [address] [phone]        gastroenterology. Schedule an appointment as soon as possible for a visit in 6 weeks.   Why:  for colonoscopy         Diet recommendation: soft diet for 3 weeks  Activity: The patient is advised to gradually reintroduce usual activities.  Discharge Condition: good  Code Status: full code  History of present illness: As per the H and P dictated on admission, "Nicholas Herrera is a 76 y.o. male with history of hypertension, peripheral vascular disease was referred to the ER from primary care office after patient was found to be hypotensive. Last week during early part, patient had constipation followed by diarrhea in the later half. Diarrhea stopped after patient had Imodium. But patient has been having some lower quadrant discomfort with subjective feeling of fever and chills and followed up with his PCP yesterday. Patient also has been feeling weak and fatigued yesterday. In the PCPs office patient was found to be hypotensive with systolic blood pressure in the 50s. EMS was called and patient was given fluid bolus and transferred  to ER. In the ER patient's blood pressure improved with fluids. Since patient had abdominal discomfort in the lower quadrant CT of the abdomen and pelvis was done which showed possible sigmoid abscess with ileus. Gen. surgery was consulted. Patient had Mohs surgery last month during which patient had received 5 days of antibiotics.  Patient also had one episode of vomiting last week. Denies any chest pain or shortness of breath."  Hospital Course:   Summary of his active problems in the hospital is as following. 1. Sigmoid diverticulitis with abscess. Sepsis ruled out. Patient presented with abdominal pain and diarrhea. Having normal bowel movements CT scan also shows presence of ileus. Patient was started on IV Zosyn. Leukocytosis is stable, diarrhea currently resolved. Appreciate general surgery input. On soft liquid diet at present. Repeat CT on 11/18/2016 shows sigmoid diverticulitis, with abscess and ileus. 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 Since patient clinically is getting better surgery recommends to resume soft diet and continue with the current antibiotics. Will discharge on oral augmentin  2. Essential hypertension. Continue home meds, resume ACE inhibitos. Use when necessary IV hydralazine.  3. Mood disorder. Resume home medication  All other chronic medical condition were stable during the hospitalization.  Patient was ambulatory without any assistance. On the day of the discharge the patient's vitals were stable , and no other acute medical condition were reported by patient. the patient was felt safe to be discharge at home with family.  Procedures and Results:  none  Consultations:  General surgery  DISCHARGE MEDICATION: Discharge Medication List as of 11/20/2016 10:24 AM    CONTINUE these medications which have CHANGED   Details  amoxicillin-clavulanate (AUGMENTIN) 500-125 MG tablet Take 1 tablet (500 mg  total) by mouth 3 (three) times daily., Starting Tue 11/20/2016, Until Sun 12/02/2016, Normal    saccharomyces boulardii (FLORASTOR) 250 MG capsule Take 1 capsule (250 mg total) by mouth 2 (two) times daily., Starting Tue 11/20/2016, Normal      CONTINUE these medications which have NOT CHANGED   Details  acetaminophen (TYLENOL) 500 MG tablet Take 500-1,000 mg by mouth every 8 (eight) hours as needed. , Historical Med    benazepril (LOTENSIN) 20 MG tablet Take 1 tablet (20 mg total) by mouth daily., Starting Thu 12/30/2014, Normal    carvedilol (COREG) 12.5 MG tablet Take 1.5 tablets (18.75 mg total) by mouth 2 (two) times daily with a meal., Starting Tue 06/12/2016, Normal    clopidogrel (PLAVIX) 75 MG tablet Take 1 tablet (75 mg total) by mouth daily with breakfast., Starting Fri 02/06/2013, Normal    mirabegron ER (MYRBETRIQ) 50 MG TB24 tablet Take 50 mg by mouth daily., Historical Med    Multiple Vitamins-Minerals (ONE-A-DAY MENS 50+ ADVANTAGE PO) Take 1 tablet by mouth daily. , Until Discontinued, Historical Med    PARoxetine (PAXIL) 40 MG tablet Take 40 mg by mouth daily., Starting Thu 10/18/2016, Historical Med    simvastatin (ZOCOR) 40 MG tablet Take 40 mg by mouth at bedtime. , Starting 07/31/2011, Until Discontinued, Historical Med    venlafaxine XR (EFFEXOR-XR) 37.5 MG 24 hr capsule Take 1 capsule by mouth 2 (two) times daily., Starting Wed 05/09/2016, Historical Med       Allergies  Allergen Reactions  . Sulfa Antibiotics Other (See Comments)    UNKNOWN   Discharge Instructions    Diet - low sodium heart healthy    Complete by:  As directed    Discharge instructions    Complete by:  As directed    It is important that you read following instructions as well as go over your medication list with RN to help you understand your care after this hospitalization.  Discharge Instructions: Please follow-up with PCP in one week  Please request your primary care physician to go over  all Hospital Tests and Procedure/Radiological results at the follow up,  Please get all Hospital records sent to your PCP by signing hospital release before you go home.   Do not take more than prescribed Pain, Sleep and Anxiety Medications. You were cared for by a hospitalist during your hospital stay. If you have any questions about your discharge medications or the care you received while you were in the hospital after you are discharged, you can call the unit and ask to speak with the hospitalist on call if the hospitalist that took care of you is not available.  Once you are discharged, your primary care physician will handle any further medical issues. Please note that NO REFILLS for any discharge medications will be authorized once you are discharged, as it is imperative that you return to your primary care physician (or establish a relationship with a primary care physician if you do not have one) for your aftercare needs so that they can reassess your need for medications and monitor your lab values. You Must read complete instructions/literature along with all the possible adverse reactions/side effects for all the Medicines you take and that have been prescribed to you. Take any  new Medicines after you have completely understood and accept all the possible adverse reactions/side effects. Wear Seat belts while driving. If you have smoked or chewed Tobacco in the last 2 yrs please stop smoking and/or stop any Recreational drug use.   Increase activity slowly    Complete by:  As directed      Discharge Exam: Filed Weights   11/12/16 1654 11/13/16 0026  Weight: 66.7 kg (147 lb) 67.9 kg (149 lb 9.6 oz)   Vitals:   11/20/16 0411 11/20/16 0956  BP: (!) 143/75 (!) 119/50  Pulse: 75 73  Resp: 18   Temp: 98.9 F (37.2 C)    General: Appear in no distress, no Rash; Oral Mucosa moist. Cardiovascular: S1 and S2 Present, no Murmur, no JVD Respiratory: Bilateral Air entry present and Clear  to Auscultation, no Crackles, no wheezes Abdomen: Bowel Sound present, Soft and no tenderness Extremities: no Pedal edema, no calf tenderness Neurology: Grossly no focal neuro deficit.  The results of significant diagnostics from this hospitalization (including imaging, microbiology, ancillary and laboratory) are listed below for reference.    Significant Diagnostic Studies: Ct Abdomen Pelvis W Contrast  Result Date: 11/19/2016 CLINICAL DATA:  76 y/o M; right-sided abdominal pain. History of diverticulitis. EXAM: CT ABDOMEN AND PELVIS WITH CONTRAST TECHNIQUE: Multidetector CT imaging of the abdomen and pelvis was performed using the standard protocol following bolus administration of intravenous contrast. CONTRAST:  135mL ISOVUE-300 IOPAMIDOL (ISOVUE-300) INJECTION 61% COMPARISON:  11/12/2016 CT abdomen and pelvis. FINDINGS: Lower chest: Small bilateral pleural effusions. Left upper lobe calcified granuloma. Stable heart size. No pericardial effusion. Severe coronary artery calcifications. Hepatobiliary: Scattered calcifications in the liver likely representing sequelae of prior granulomatous disease. No focal liver lesion identified. Mild wall thickening of the bladder possibly due to the presence of ascites. No intra or extrahepatic biliary ductal dilatation. Pancreas: Unremarkable. No pancreatic ductal dilatation or surrounding inflammatory changes. Spleen: Small size spleen with multiple calcifications likely representing sequelae of prior granulomatous disease. Adrenals/Urinary Tract: Multiple renal cysts bilaterally the largest in the left lower pole are stable. Normal adrenal glands. No hydronephrosis. Mildly distended bladder. Stomach/Bowel: Diffusely dilated small bowel likely representing ileus. Fluid collection within the wall of sigmoid colon in setting of severe diverticulitis minimally increased in size measuring 14 mm (series 201, image 74). There has been interval development of a colo  enteric fistula with abscess seen loop of small bowel measuring approximately 55 x 34 mm (series 201, image 68). The fistulas best appreciated on the coronal series (series 203 images 62 -71). Vascular/Lymphatic: Aortic atherosclerosis with severe calcifications of the infrarenal segment. Prominent retroperitoneal and mediastinal lymph nodes are likely reactive. Reproductive: Enlarged prostate. Other: Small volume of ascites is increased from prior study. No hernia. Musculoskeletal: No acute osseous abnormality. IMPRESSION: 1. Sigmoid diverticulitis and stable diffuse small bowel dilatation likely representing ileus. 2. Fluid collection within the wall of sigmoid colon in the lower abdomen is mildly increased in size likely representing an abscess. 3. 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 4. Small volume of ascites is mildly increased. 5. Wall thickening of the gallbladder is probably due to the presence of ascites, correlation with liver function tests is recommended. 6. Small bilateral pleural effusions. Electronically Signed   By: Kristine Garbe M.D.   On: 11/19/2016 05:03   Ct Abdomen Pelvis W Contrast  Result Date: 11/12/2016 CLINICAL DATA:  Left lower quadrant pain, nausea, vomiting  and diarrhea x5 days. EXAM: CT ABDOMEN AND PELVIS WITH CONTRAST TECHNIQUE: Multidetector CT imaging of the abdomen and pelvis was performed using the standard protocol following bolus administration of intravenous contrast. CONTRAST:  135mL ISOVUE-300 IOPAMIDOL (ISOVUE-300) INJECTION 61% COMPARISON:  10/12/2014 CT FINDINGS: Lower chest: Right atrial and right ventricular pacing leads noted. Normal size cardiac chambers. Atelectasis at the right lung base posteriorly. No significant pleural effusion. No pneumothorax. Small hiatal hernia. Hepatobiliary: Scattered hepatic calcifications consistent with old granulomatous disease. No  biliary dilatation. Gallbladder is free of stones. Pancreas: No acute abnormality. No space-occupying mass or ductal dilatation. Spleen: No splenomegaly. Numerous scattered splenic calcifications consistent with old granulomatous disease. Adrenals/Urinary Tract: No adrenal mass. No obstructive uropathy. Mild perinephric fat stranding on the right. Stable bilateral renal hypodensities consistent statistically with cysts the largest is in the left lower pole measuring 18 mm in diameter. Urinary bladder is contracted. Stomach/Bowel: Hypodensity within the mesenteric side of the sigmoid colon consistent with an intramural abscess measuring approximately 12 mm in diameter possibly from diverticulitis. Numerous fluid-filled mild-to-moderately distended small bowel loops are noted to the level of the terminal ileum likely from a sympathetic ileus. Vascular/Lymphatic: Aortoiliac atherosclerosis. No aneurysm or dissection. Small subcentimeter mesenteric lymph nodes. Reproductive: Enlarged prostate up to 5.6 x 5.1 x 5.7 cm (volume = 85 cm^3). Other: Small amount of ascites overlies the dome of the liver and is seen in the pelvis. Musculoskeletal: No acute or significant osseous findings. IMPRESSION: 1. 12 mm hypodensity within the wall of the sigmoid colon suspicious for an intramural abscess in the setting of oral diverticulitis. Sympathetic fluid-filled distention of small bowel consistent with ileus. 2. Hepatic and splenic calcifications consistent with old granulomatous disease. 3. Stable bilateral renal hypodensities consistent with renal cysts. 4. Prostatic enlargement. 5. Right basilar atelectasis. Electronically Signed   By: Ashley Royalty M.D.   On: 11/12/2016 21:18    Microbiology: No results found for this or any previous visit (from the past 240 hour(s)).   Labs: CBC:  Recent Labs Lab 11/18/16 0440 11/19/16 0527 11/20/16 0602  WBC 17.0* 16.3* 14.2*  NEUTROABS 13.7* 12.9* 11.0*  HGB 11.6* 13.3 13.4    HCT 34.2* 39.2 39.4  MCV 91.2 91.6 91.6  PLT 401* 479* [id number]*   Basic Metabolic Panel:  Recent Labs Lab 11/18/16 0440 11/19/16 0527 11/20/16 0602  NA 136 135 135  K 3.1* 3.8 3.9  CL 100* 100* 99*  CO2 26 25 26   GLUCOSE 98 97 97  BUN 7 7 8   CREATININE 0.88 0.90 0.94  CALCIUM 7.9* 8.3* 8.5*   Liver Function Tests: No results for input(s): AST, ALT, ALKPHOS, BILITOT, PROT, ALBUMIN in the last 168 hours. No results for input(s): LIPASE, AMYLASE in the last 168 hours. No results for input(s): AMMONIA in the last 168 hours. Cardiac Enzymes: No results for input(s): CKTOTAL, CKMB, CKMBINDEX, TROPONINI in the last 168 hours. BNP (last 3 results) No results for input(s): BNP in the last 8760 hours. CBG:  Recent Labs Lab 11/19/16 0052 11/19/16 0541 11/19/16 1240 11/19/16 1709 11/20/16 0110  GLUCAP 100* 101* 178* 98 102*   Time spent: 30 minutes  Signed:  PATEL, PRANAV  [facility] 11/20/2016 , 7:21 PM

## 2016-11-27 DIAGNOSIS — I10 Essential (primary) hypertension: Secondary | ICD-10-CM | POA: Diagnosis not present

## 2016-11-27 DIAGNOSIS — K5720 Diverticulitis of large intestine with perforation and abscess without bleeding: Secondary | ICD-10-CM | POA: Diagnosis not present

## 2016-12-03 ENCOUNTER — Other Ambulatory Visit: Payer: Self-pay | Admitting: Gastroenterology

## 2016-12-03 DIAGNOSIS — K5792 Diverticulitis of intestine, part unspecified, without perforation or abscess without bleeding: Secondary | ICD-10-CM

## 2016-12-06 ENCOUNTER — Ambulatory Visit
Admission: RE | Admit: 2016-12-06 | Discharge: 2016-12-06 | Disposition: A | Discharge status: Home / Self Care | Payer: Medicare Other | Source: Ambulatory Visit | Attending: Gastroenterology | Admitting: Gastroenterology

## 2016-12-06 DIAGNOSIS — K5792 Diverticulitis of intestine, part unspecified, without perforation or abscess without bleeding: Secondary | ICD-10-CM

## 2016-12-06 DIAGNOSIS — K5732 Diverticulitis of large intestine without perforation or abscess without bleeding: Secondary | ICD-10-CM | POA: Diagnosis not present

## 2016-12-06 MED ORDER — IOPAMIDOL (ISOVUE-300) INJECTION 61%
100.0000 mL | Freq: Once | INTRAVENOUS | Status: AC | PRN
  Administered 2016-12-06: 100 mL via INTRAVENOUS

## 2016-12-17 DIAGNOSIS — K5792 Diverticulitis of intestine, part unspecified, without perforation or abscess without bleeding: Secondary | ICD-10-CM | POA: Diagnosis not present

## 2017-01-01 ENCOUNTER — Ambulatory Visit (INDEPENDENT_AMBULATORY_CARE_PROVIDER_SITE_OTHER): Payer: Medicare Other | Admitting: Cardiovascular Disease

## 2017-01-01 ENCOUNTER — Encounter: Payer: Self-pay | Admitting: Cardiovascular Disease

## 2017-01-01 VITALS — BP 142/84 | HR 80 | Ht 70.0 in | Wt 141.0 lb

## 2017-01-01 DIAGNOSIS — I2510 Atherosclerotic heart disease of native coronary artery without angina pectoris: Secondary | ICD-10-CM | POA: Diagnosis not present

## 2017-01-01 DIAGNOSIS — I5022 Chronic systolic (congestive) heart failure: Secondary | ICD-10-CM | POA: Diagnosis not present

## 2017-01-01 DIAGNOSIS — I739 Peripheral vascular disease, unspecified: Secondary | ICD-10-CM | POA: Diagnosis not present

## 2017-01-01 DIAGNOSIS — Z720 Tobacco use: Secondary | ICD-10-CM

## 2017-01-01 NOTE — Progress Notes (Signed)
Cardiology Office Note   Date:  01/01/2017   ID:  Nicholas Herrera, DOB [birth year], MRN [id number]  PCP:  Lilian Coma, MD  Cardiologist:  Dr. Tamala Julian  Chief Complaint  Patient presents with  . Follow-up    6 months      History of Present Illness: Nicholas Herrera is a 76 y.o. male who is here today for a follow-up visit regarding peripheral arterial disease. He has known history of coronary artery disease, chronic systolic/diastolic heart failure and hypertension. He has known history of peripheral arterial disease with previous revascularization of the right proximal popliteal artery using a 6 x 100 mm self-expanding stent in 2014. Subsequent studies showed occluded right popliteal artery stent with 2 vessel runoff below the knee and mildly reduced ABI. Medical therapy was recommended with a walking program and the patient's symptoms remain stable. He was hospitalized in January for sepsis due to diverticulitis. Benazepril was discontinued due to hypotension. He is currently not walking far enough to feel any claudication. He denies any chest pain, shortness of breath or syncope. Unfortunately, he continues to smoke one pack per day.   Past Medical History:  Diagnosis Date  . Abdominal distention   . Abdominal pain   . Bronchitis    hx of  . Cancer ([place])    skin  . CHF (congestive heart failure) ([facility])     (hx of EF 27%) s/p AICD 2004. Most recent LVEF > 40% , 8/11  . Chronic kidney disease    has small stones, no treatment  . Cough   . Depression    takes paxil  . Diverticulosis    hx of  . Dysrhythmia   . Easy bruising   . Hematuria    hx of  . Hernia october 2012   [facility]  . HTN (hypertension)   . Hyperlipidemia    takes zocor  . Inguinal hernia   . Left groin pain     Past Surgical History:  Procedure Laterality Date  . APPENDECTOMY    . CARDIAC CATHETERIZATION    . CARDIAC DEFIBRILLATOR REMOVAL  2005  . HERNIA REPAIR  09/11/11   repair of LIH   .  INGUINAL HERNIA REPAIR  09/11/2011   Procedure: HERNIA REPAIR INGUINAL ADULT;  Surgeon: Judieth Keens, DO;  Location: [facility];  Service: General;  Laterality: Left;  open left inguinal hernia with mesh  . LOWER EXTREMITY ANGIOGRAM N/A 02/05/2013   Procedure: LOWER EXTREMITY ANGIOGRAM;  Surgeon: Jettie Booze, MD;  Location: [facility] CATH LAB;  Service: Cardiovascular;  Laterality: N/A;     Current Outpatient Prescriptions  Medication Sig Dispense Refill  . acetaminophen (TYLENOL) 500 MG tablet Take 1,000 mg by mouth every 8 (eight) hours as needed for mild pain, fever or headache.     . carvedilol (COREG) 12.5 MG tablet Take 1.5 tablets (18.75 mg total) by mouth 2 (two) times daily with a meal. 270 tablet 3  . clopidogrel (PLAVIX) 75 MG tablet Take 1 tablet (75 mg total) by mouth daily with breakfast. 30 tablet 11  . mirabegron ER (MYRBETRIQ) 50 MG TB24 tablet Take 50 mg by mouth daily.    . Multiple Vitamins-Minerals (ONE-A-DAY MENS 50+ ADVANTAGE PO) Take 1 tablet by mouth daily.     [name] PARoxetine (PAXIL) 40 MG tablet Take 40 mg by mouth daily.    . simvastatin (ZOCOR) 40 MG tablet Take 40 mg by mouth at bedtime.     [name] venlafaxine  XR (EFFEXOR-XR) 37.5 MG 24 hr capsule Take 1 capsule by mouth 2 (two) times daily.     No current facility-administered medications for this visit.     Allergies:   Sulfa antibiotics    Social History:  The patient  reports that he has been smoking Cigars.  He has a 48.00 pack-year smoking history. He has never used smokeless tobacco. He reports that he drinks about 3.5 oz of alcohol per week . He reports that he does not use drugs.   Family History:  The patient's family history includes Cancer in his mother.    ROS:  Please see the history of present illness.   Otherwise, review of systems are positive for none.   All other systems are reviewed and negative.    PHYSICAL EXAM: VS:  BP (!) 142/84   Pulse 80   Ht 5\' 10"  (1.778 m)   Wt 141 lb (64 kg)    BMI 20.23 kg/m  , BMI Body mass index is 20.23 kg/m. GEN: Well nourished, well developed, in no acute distress  HEENT: normal  Neck: no JVD, carotid bruits, or masses Cardiac: RRR; no murmurs, rubs, or gallops,no edema  Respiratory:  clear to auscultation bilaterally, normal work of breathing GI: soft, nontender, nondistended, + BS MS: no deformity or atrophy  Skin: warm and dry, no rash Neuro:  Strength and sensation are intact Psych: euthymic mood, full affect   EKG:  EKG is not ordered today.    Recent Labs: 11/15/2016: ALT 31 11/16/2016: Magnesium 1.6 11/20/2016: BUN 8; Creatinine, Ser 0.94; Hemoglobin 13.4; Platelets 488; Potassium 3.9; Sodium 135    Lipid Panel No results found for: CHOL, TRIG, HDL, CHOLHDL, VLDL, LDLCALC, LDLDIRECT    Wt Readings from Last 3 Encounters:  01/01/17 141 lb (64 kg)  11/13/16 149 lb 9.6 oz (67.9 kg)  07/03/16 151 lb 6.4 oz (68.7 kg)       No flowsheet data found.    ASSESSMENT AND PLAN:  1.  Peripheral arterial disease: The patient has Mild to moderate right calf claudication due to an occluded right popliteal artery stent.  Cilostazol is contraindicated due to history of heart failure. He is currently not having any claudication likely because he is not exerting himself enough after recent illness. Continue medical therapy.   2. Tobacco use: I had a prolonged discussion with him  again about the importance of smoking cessation but he has not been able to quit or slow down.  3. Hyperlipidemia: Continue treatment with simvastatin with a target LDL of less than 70.  4. Coronary artery disease: No anginal symptoms.   5. Chronic systolic heart failure: Stable on medications.    Disposition:   FU with me in 12 months  Signed,  Kathlyn Sacramento, MD  01/01/2017 11:10 AM    [place]

## 2017-01-01 NOTE — Patient Instructions (Signed)

## 2017-01-08 DIAGNOSIS — K5792 Diverticulitis of intestine, part unspecified, without perforation or abscess without bleeding: Secondary | ICD-10-CM | POA: Diagnosis not present

## 2017-02-13 DIAGNOSIS — H25813 Combined forms of age-related cataract, bilateral: Secondary | ICD-10-CM | POA: Diagnosis not present

## 2017-02-13 DIAGNOSIS — H43813 Vitreous degeneration, bilateral: Secondary | ICD-10-CM | POA: Diagnosis not present

## 2017-02-13 DIAGNOSIS — H524 Presbyopia: Secondary | ICD-10-CM | POA: Diagnosis not present

## 2017-02-21 DIAGNOSIS — C4441 Basal cell carcinoma of skin of scalp and neck: Secondary | ICD-10-CM | POA: Diagnosis not present

## 2017-02-21 DIAGNOSIS — L570 Actinic keratosis: Secondary | ICD-10-CM | POA: Diagnosis not present

## 2017-02-21 DIAGNOSIS — D692 Other nonthrombocytopenic purpura: Secondary | ICD-10-CM | POA: Diagnosis not present

## 2017-02-21 DIAGNOSIS — Z85828 Personal history of other malignant neoplasm of skin: Secondary | ICD-10-CM | POA: Diagnosis not present

## 2017-02-21 DIAGNOSIS — L821 Other seborrheic keratosis: Secondary | ICD-10-CM | POA: Diagnosis not present

## 2017-02-21 DIAGNOSIS — D485 Neoplasm of uncertain behavior of skin: Secondary | ICD-10-CM | POA: Diagnosis not present

## 2017-03-18 DIAGNOSIS — H2511 Age-related nuclear cataract, right eye: Secondary | ICD-10-CM | POA: Diagnosis not present

## 2017-04-01 DIAGNOSIS — H2511 Age-related nuclear cataract, right eye: Secondary | ICD-10-CM | POA: Diagnosis not present

## 2017-04-01 DIAGNOSIS — H25811 Combined forms of age-related cataract, right eye: Secondary | ICD-10-CM | POA: Diagnosis not present

## 2017-04-09 DIAGNOSIS — H2512 Age-related nuclear cataract, left eye: Secondary | ICD-10-CM | POA: Diagnosis not present

## 2017-04-15 DIAGNOSIS — H2512 Age-related nuclear cataract, left eye: Secondary | ICD-10-CM | POA: Diagnosis not present

## 2017-04-15 DIAGNOSIS — H25812 Combined forms of age-related cataract, left eye: Secondary | ICD-10-CM | POA: Diagnosis not present

## 2017-05-11 DIAGNOSIS — K146 Glossodynia: Secondary | ICD-10-CM | POA: Diagnosis not present

## 2017-05-14 ENCOUNTER — Other Ambulatory Visit: Payer: Self-pay | Admitting: Interventional Cardiology

## 2017-05-23 DIAGNOSIS — L821 Other seborrheic keratosis: Secondary | ICD-10-CM | POA: Diagnosis not present

## 2017-05-23 DIAGNOSIS — D1801 Hemangioma of skin and subcutaneous tissue: Secondary | ICD-10-CM | POA: Diagnosis not present

## 2017-05-23 DIAGNOSIS — Z85828 Personal history of other malignant neoplasm of skin: Secondary | ICD-10-CM | POA: Diagnosis not present

## 2017-05-23 DIAGNOSIS — D692 Other nonthrombocytopenic purpura: Secondary | ICD-10-CM | POA: Diagnosis not present

## 2017-05-23 DIAGNOSIS — L570 Actinic keratosis: Secondary | ICD-10-CM | POA: Diagnosis not present

## 2017-07-12 ENCOUNTER — Encounter: Payer: Self-pay | Admitting: Interventional Cardiology

## 2017-07-12 ENCOUNTER — Ambulatory Visit (INDEPENDENT_AMBULATORY_CARE_PROVIDER_SITE_OTHER): Payer: Medicare Other | Admitting: Interventional Cardiology

## 2017-07-12 VITALS — BP 156/88 | HR 66 | Ht 70.0 in | Wt 154.2 lb

## 2017-07-12 DIAGNOSIS — I739 Peripheral vascular disease, unspecified: Secondary | ICD-10-CM

## 2017-07-12 DIAGNOSIS — I2510 Atherosclerotic heart disease of native coronary artery without angina pectoris: Secondary | ICD-10-CM

## 2017-07-12 DIAGNOSIS — E784 Other hyperlipidemia: Secondary | ICD-10-CM

## 2017-07-12 DIAGNOSIS — I10 Essential (primary) hypertension: Secondary | ICD-10-CM | POA: Diagnosis not present

## 2017-07-12 DIAGNOSIS — I5042 Chronic combined systolic (congestive) and diastolic (congestive) heart failure: Secondary | ICD-10-CM | POA: Diagnosis not present

## 2017-07-12 DIAGNOSIS — E7849 Other hyperlipidemia: Secondary | ICD-10-CM

## 2017-07-12 DIAGNOSIS — F17200 Nicotine dependence, unspecified, uncomplicated: Secondary | ICD-10-CM | POA: Diagnosis not present

## 2017-07-12 NOTE — Progress Notes (Signed)
Cardiology Office Note    Date:  07/12/2017   ID:  Nicholas Herrera, DOB [birth year], MRN [id number]  PCP:  Nicholas Jordan, MD  Cardiologist: Nicholas Grooms, MD   No chief complaint on file.   History of Present Illness:  Nicholas Herrera is a 76 y.o. male follow-up of hypertension in setting of coronary disease and chronic combined systolic and diastolic heart failure.Other significant medical problems include CK D stage III, essential hypertension, and hyperlipidemia.  Overall he feels well. He has been through significant health challenges over the past 12 months the most serious of which was a diverticular abscess in January 2018. Conservative medical management with prolonged antibiotic therapy was used.  Has claudication involving the right lower extremity greater than the left. This is relatively stable and not quality-of-life limiting. This is being followed by Dr. Fletcher Anon.  He continues to smoke cigarettes. He complains of dyspnea on exertion.  He denies angina pectoris or exertional chest pain.   Past Medical History:  Diagnosis Date  . Abdominal distention   . Abdominal pain   . Bronchitis    hx of  . Cancer ([place])    skin  . CHF (congestive heart failure) (HCC)     (hx of EF 27%) s/p AICD 2004. Most recent LVEF > 40% , 8/11  . Chronic kidney disease    has small stones, no treatment  . Cough   . Depression    takes paxil  . Diverticulosis    hx of  . Dysrhythmia   . Easy bruising   . Hematuria    hx of  . Hernia october 2012   [facility]  . HTN (hypertension)   . Hyperlipidemia    takes zocor  . Inguinal hernia   . Left groin pain     Past Surgical History:  Procedure Laterality Date  . APPENDECTOMY    . CARDIAC CATHETERIZATION    . CARDIAC DEFIBRILLATOR REMOVAL  2005  . HERNIA REPAIR  09/11/11   repair of LIH   . INGUINAL HERNIA REPAIR  09/11/2011   Procedure: HERNIA REPAIR INGUINAL ADULT;  Surgeon: Judieth Keens, DO;  Location: [place];   Service: General;  Laterality: Left;  open left inguinal hernia with mesh  . LOWER EXTREMITY ANGIOGRAM N/A 02/05/2013   Procedure: LOWER EXTREMITY ANGIOGRAM;  Surgeon: Jettie Booze, MD;  Location: [facility] CATH LAB;  Service: Cardiovascular;  Laterality: N/A;    Current Medications: Outpatient Medications Prior to Visit  Medication Sig Dispense Refill  . acetaminophen (TYLENOL) 500 MG tablet Take 1,000 mg by mouth every 8 (eight) hours as needed for mild pain, fever or headache.     . carvedilol (COREG) 12.5 MG tablet TAKE 1 AND 1/2 TABLETS BY  MOUTH TWO TIMES DAILY WITH  MEALS 270 tablet 3  . clopidogrel (PLAVIX) 75 MG tablet Take 1 tablet (75 mg total) by mouth daily with breakfast. 30 tablet 11  . mirabegron ER (MYRBETRIQ) 50 MG TB24 tablet Take 50 mg by mouth daily.    . Multiple Vitamins-Minerals (ONE-A-DAY MENS 50+ ADVANTAGE PO) Take 1 tablet by mouth daily.     [name] PARoxetine (PAXIL) 40 MG tablet Take 40 mg by mouth daily.    . simvastatin (ZOCOR) 40 MG tablet Take 40 mg by mouth at bedtime.     [name] venlafaxine XR (EFFEXOR-XR) 37.5 MG 24 hr capsule Take 1 capsule by mouth 2 (two) times daily.     No facility-administered medications prior  to visit.      Allergies:   Sulfa antibiotics   Social History   Social History  . Marital status: Married    Spouse name: N/A  . Number of children: N/A  . Years of education: N/A   Social History Main Topics  . Smoking status: Current Every Day Smoker    Packs/day: 1.00    Years: 48.00    Types: Cigars  . Smokeless tobacco: Never Used  . Alcohol use 3.5 oz/week    7 drink(s) per week  . Drug use: No  . Sexual activity: Not Asked   Other Topics Concern  . None   Social History Narrative  . None     Family History:  The patient's family history includes Cancer in his mother.   ROS:   Please see the history of present illness.    Intermittent abdominal discomfort, easy bruising, recent diagnoses of diverticulosis, chronic cough,  and dyspnea on exertion.  All other systems reviewed and are negative.   PHYSICAL EXAM:   VS:  BP (!) 156/88 (BP Location: Left Arm)   Pulse 66   Ht 5\' 10"  (1.778 m)   Wt 154 lb 3.2 oz (69.9 kg)   BMI 22.13 kg/m    GEN: Well nourished, well developed, in no acute distress  HEENT: normal  Neck: no JVD, carotid bruits, or masses Cardiac: RRR; no murmurs, rubs, or gallops,no edema  Respiratory:  clear to auscultation bilaterally, normal work of breathing GI: soft, nontender, nondistended, + BS MS: no deformity or atrophy  Skin: warm and dry, no rash Neuro:  Alert and Oriented x 3, Strength and sensation are intact Psych: euthymic mood, full affect  Wt Readings from Last 3 Encounters:  07/12/17 154 lb 3.2 oz (69.9 kg)  01/01/17 141 lb (64 kg)  11/13/16 149 lb 9.6 oz (67.9 kg)      Studies/Labs Reviewed:   EKG:  EKG  Normal sinus rhythm, nonspecific T-wave abnormality, prominent voltage.  Recent Labs: 11/15/2016: ALT 31 11/16/2016: Magnesium 1.6 11/20/2016: BUN 8; Creatinine, Ser 0.94; Hemoglobin 13.4; Platelets 488; Potassium 3.9; Sodium 135   Lipid Panel No results found for: CHOL, TRIG, HDL, CHOLHDL, VLDL, LDLCALC, LDLDIRECT  Additional studies/ records that were reviewed today include:  No new cardiac functional data.    ASSESSMENT:    1. Chronic combined systolic and diastolic heart failure ([place])   2. Essential hypertension   3. Peripheral vascular disease ([place])   4. Other hyperlipidemia   5. Tobacco use disorder      PLAN:  In order of problems listed above:  1. Volume status is stable. No evidence of CHF. LVEF is known to be greater than 40%. Continue chronic heart failure therapy with carvedilol 18.75 mg twice a day. 2. Blood pressure is under good control. Target 130/90 mmHg a less. 3. Encouraged diligent walking to help improve right lower extremity claudication. 4. LDL target less than 70. Most recent as 101. Needs to switch to a more potent statin,  perhaps 40 mg of atorvastatin or 20 mg Crestor 5. Smoking cessation is encouraged.   Clinical follow-up in one year. Call if angina. Call if claudication worsens. Discuss up titration of statin intensity to get LDL less than 70.    Medication Adjustments/Labs and Tests Ordered: Current medicines are reviewed at length with the patient today.  Concerns regarding medicines are outlined above.  Medication changes, Labs and Tests ordered today are listed in the Patient Instructions below. There are no  Patient Instructions on file for this visit.   Signed, Nicholas Grooms, MD  07/12/2017 2:40 PM    [facility] [address], [place], [place]  [address] Phone: [phone]; Fax: [phone]

## 2017-07-12 NOTE — Patient Instructions (Addendum)
Medication Instructions:  None  Labwork: None  Testing/Procedures: None  Follow-Up: Your physician wants you to follow-up in: 1 year with Dr. Smith. You will receive a reminder letter in the mail two months in advance. If you don't receive a letter, please call our office to schedule the follow-up appointment.   Any Other Special Instructions Will Be Listed Below (If Applicable).  Your physician discussed the importance of regular exercise and recommended that you start or continue a regular exercise program for good health.    If you need a refill on your cardiac medications before your next appointment, please call your pharmacy.   

## 2017-08-06 DIAGNOSIS — Z23 Encounter for immunization: Secondary | ICD-10-CM | POA: Diagnosis not present

## 2017-09-25 DIAGNOSIS — L821 Other seborrheic keratosis: Secondary | ICD-10-CM | POA: Diagnosis not present

## 2017-09-25 DIAGNOSIS — Z85828 Personal history of other malignant neoplasm of skin: Secondary | ICD-10-CM | POA: Diagnosis not present

## 2017-09-25 DIAGNOSIS — D1801 Hemangioma of skin and subcutaneous tissue: Secondary | ICD-10-CM | POA: Diagnosis not present

## 2017-09-25 DIAGNOSIS — C44319 Basal cell carcinoma of skin of other parts of face: Secondary | ICD-10-CM | POA: Diagnosis not present

## 2017-09-25 DIAGNOSIS — L570 Actinic keratosis: Secondary | ICD-10-CM | POA: Diagnosis not present

## 2017-09-25 DIAGNOSIS — D485 Neoplasm of uncertain behavior of skin: Secondary | ICD-10-CM | POA: Diagnosis not present

## 2017-11-19 DIAGNOSIS — R251 Tremor, unspecified: Secondary | ICD-10-CM | POA: Diagnosis not present

## 2017-11-19 DIAGNOSIS — I5042 Chronic combined systolic (congestive) and diastolic (congestive) heart failure: Secondary | ICD-10-CM | POA: Diagnosis not present

## 2017-11-19 DIAGNOSIS — I739 Peripheral vascular disease, unspecified: Secondary | ICD-10-CM | POA: Diagnosis not present

## 2017-11-19 DIAGNOSIS — F322 Major depressive disorder, single episode, severe without psychotic features: Secondary | ICD-10-CM | POA: Diagnosis not present

## 2017-11-19 DIAGNOSIS — E785 Hyperlipidemia, unspecified: Secondary | ICD-10-CM | POA: Diagnosis not present

## 2017-11-19 DIAGNOSIS — R739 Hyperglycemia, unspecified: Secondary | ICD-10-CM | POA: Diagnosis not present

## 2017-11-19 DIAGNOSIS — G250 Essential tremor: Secondary | ICD-10-CM | POA: Diagnosis not present

## 2017-11-19 DIAGNOSIS — I2510 Atherosclerotic heart disease of native coronary artery without angina pectoris: Secondary | ICD-10-CM | POA: Diagnosis not present

## 2017-11-19 DIAGNOSIS — Z0000 Encounter for general adult medical examination without abnormal findings: Secondary | ICD-10-CM | POA: Diagnosis not present

## 2017-11-19 DIAGNOSIS — Z79899 Other long term (current) drug therapy: Secondary | ICD-10-CM | POA: Diagnosis not present

## 2017-11-19 DIAGNOSIS — D692 Other nonthrombocytopenic purpura: Secondary | ICD-10-CM | POA: Diagnosis not present

## 2017-11-22 DIAGNOSIS — H35033 Hypertensive retinopathy, bilateral: Secondary | ICD-10-CM | POA: Diagnosis not present

## 2017-11-22 DIAGNOSIS — H26493 Other secondary cataract, bilateral: Secondary | ICD-10-CM | POA: Diagnosis not present

## 2017-11-22 DIAGNOSIS — Z961 Presence of intraocular lens: Secondary | ICD-10-CM | POA: Diagnosis not present

## 2017-11-22 DIAGNOSIS — H43813 Vitreous degeneration, bilateral: Secondary | ICD-10-CM | POA: Diagnosis not present

## 2017-12-24 DIAGNOSIS — D692 Other nonthrombocytopenic purpura: Secondary | ICD-10-CM | POA: Diagnosis not present

## 2017-12-24 DIAGNOSIS — D485 Neoplasm of uncertain behavior of skin: Secondary | ICD-10-CM | POA: Diagnosis not present

## 2017-12-24 DIAGNOSIS — L570 Actinic keratosis: Secondary | ICD-10-CM | POA: Diagnosis not present

## 2017-12-24 DIAGNOSIS — C44622 Squamous cell carcinoma of skin of right upper limb, including shoulder: Secondary | ICD-10-CM | POA: Diagnosis not present

## 2017-12-24 DIAGNOSIS — D0439 Carcinoma in situ of skin of other parts of face: Secondary | ICD-10-CM | POA: Diagnosis not present

## 2017-12-24 DIAGNOSIS — Z85828 Personal history of other malignant neoplasm of skin: Secondary | ICD-10-CM | POA: Diagnosis not present

## 2017-12-24 DIAGNOSIS — L821 Other seborrheic keratosis: Secondary | ICD-10-CM | POA: Diagnosis not present

## 2018-03-07 DIAGNOSIS — R350 Frequency of micturition: Secondary | ICD-10-CM | POA: Diagnosis not present

## 2018-03-07 DIAGNOSIS — R310 Gross hematuria: Secondary | ICD-10-CM | POA: Diagnosis not present

## 2018-03-27 DIAGNOSIS — D485 Neoplasm of uncertain behavior of skin: Secondary | ICD-10-CM | POA: Diagnosis not present

## 2018-03-27 DIAGNOSIS — Z85828 Personal history of other malignant neoplasm of skin: Secondary | ICD-10-CM | POA: Diagnosis not present

## 2018-03-27 DIAGNOSIS — L820 Inflamed seborrheic keratosis: Secondary | ICD-10-CM | POA: Diagnosis not present

## 2018-03-27 DIAGNOSIS — L821 Other seborrheic keratosis: Secondary | ICD-10-CM | POA: Diagnosis not present

## 2018-03-27 DIAGNOSIS — D692 Other nonthrombocytopenic purpura: Secondary | ICD-10-CM | POA: Diagnosis not present

## 2018-03-27 DIAGNOSIS — L570 Actinic keratosis: Secondary | ICD-10-CM | POA: Diagnosis not present

## 2018-07-01 DIAGNOSIS — D1801 Hemangioma of skin and subcutaneous tissue: Secondary | ICD-10-CM | POA: Diagnosis not present

## 2018-07-01 DIAGNOSIS — D0439 Carcinoma in situ of skin of other parts of face: Secondary | ICD-10-CM | POA: Diagnosis not present

## 2018-07-01 DIAGNOSIS — L570 Actinic keratosis: Secondary | ICD-10-CM | POA: Diagnosis not present

## 2018-07-01 DIAGNOSIS — D485 Neoplasm of uncertain behavior of skin: Secondary | ICD-10-CM | POA: Diagnosis not present

## 2018-07-01 DIAGNOSIS — D0462 Carcinoma in situ of skin of left upper limb, including shoulder: Secondary | ICD-10-CM | POA: Diagnosis not present

## 2018-07-01 DIAGNOSIS — L821 Other seborrheic keratosis: Secondary | ICD-10-CM | POA: Diagnosis not present

## 2018-07-01 DIAGNOSIS — Z85828 Personal history of other malignant neoplasm of skin: Secondary | ICD-10-CM | POA: Diagnosis not present

## 2018-07-15 DIAGNOSIS — H35033 Hypertensive retinopathy, bilateral: Secondary | ICD-10-CM | POA: Diagnosis not present

## 2018-07-15 DIAGNOSIS — Z961 Presence of intraocular lens: Secondary | ICD-10-CM | POA: Diagnosis not present

## 2018-07-15 DIAGNOSIS — H43813 Vitreous degeneration, bilateral: Secondary | ICD-10-CM | POA: Diagnosis not present

## 2018-07-15 DIAGNOSIS — H26493 Other secondary cataract, bilateral: Secondary | ICD-10-CM | POA: Diagnosis not present

## 2018-08-12 DIAGNOSIS — K5790 Diverticulosis of intestine, part unspecified, without perforation or abscess without bleeding: Secondary | ICD-10-CM | POA: Diagnosis not present

## 2018-08-12 DIAGNOSIS — K648 Other hemorrhoids: Secondary | ICD-10-CM | POA: Diagnosis not present

## 2018-08-12 NOTE — Progress Notes (Signed)
Cardiology Office Note:    Date:  08/13/2018   ID:  Nicholas Herrera, DOB June 08, 1941, MRN [id number]  PCP:  Jonathon Jordan, MD  Cardiologist:  No primary care provider on file.   Referring MD: Jonathon Jordan, MD   Chief Complaint  Patient presents with  . Coronary Artery Disease  . Claudication    History of Present Illness:    Nicholas Herrera is a 77 y.o. male with a hx of hypertension in setting of coronary disease and chronic combined systolic and diastolic heart failure.Other significant medical problems include CK D stage III, essential hypertension, and hyperlipidemia.  He is being limited by left calf claudication after 100 feet of walking.  If he goes up an incline the discomfort comes on even earlier.  3 to 5 minutes of rest relieves the discomfort.  He denies angina.  He has become sedentary because of it.  He feels he is getting deconditioned and is more short of breath with activity because he has not been able to maintain his overall exercise program.  No nitroglycerin use.  No orthopnea or PND.  He has no sores or skin breaks in the lower extremities.  Past Medical History:  Diagnosis Date  . Abdominal distention   . Abdominal pain   . Bronchitis    hx of  . Cancer ([place])    skin  . CHF (congestive heart failure) (HCC)     (hx of EF 27%) s/p AICD 2004. Most recent LVEF > 40% , 8/11  . Chronic kidney disease    has small stones, no treatment  . Cough   . Depression    takes paxil  . Diverticulosis    hx of  . Dysrhythmia   . Easy bruising   . Hematuria    hx of  . Hernia october 2012   [facility]  . HTN (hypertension)   . Hyperlipidemia    takes zocor  . Inguinal hernia   . Left groin pain     Past Surgical History:  Procedure Laterality Date  . APPENDECTOMY    . CARDIAC CATHETERIZATION    . CARDIAC DEFIBRILLATOR REMOVAL  2005  . HERNIA REPAIR  09/11/11   repair of LIH   . INGUINAL HERNIA REPAIR  09/11/2011   Procedure: HERNIA REPAIR INGUINAL ADULT;   Surgeon: Judieth Keens, DO;  Location: [place];  Service: General;  Laterality: Left;  open left inguinal hernia with mesh  . LOWER EXTREMITY ANGIOGRAM N/A 02/05/2013   Procedure: LOWER EXTREMITY ANGIOGRAM;  Surgeon: Jettie Booze, MD;  Location: [facility] CATH LAB;  Service: Cardiovascular;  Laterality: N/A;    Current Medications: Current Meds  Medication Sig  . acetaminophen (TYLENOL) 500 MG tablet Take 1,000 mg by mouth every 8 (eight) hours as needed for mild pain, fever or headache.   . carvedilol (COREG) 12.5 MG tablet Take one and a half (1 1/2) tablets (18.75 mg) by mouth twice daily.  . clopidogrel (PLAVIX) 75 MG tablet Take 1 tablet (75 mg total) by mouth daily with breakfast.  . mirabegron ER (MYRBETRIQ) 50 MG TB24 tablet Take 50 mg by mouth daily.  . Multiple Vitamins-Minerals (ONE-A-DAY MENS 50+ ADVANTAGE PO) Take 1 tablet by mouth daily.   [name] PARoxetine (PAXIL) 40 MG tablet Take 40 mg by mouth daily.  . simvastatin (ZOCOR) 40 MG tablet Take 40 mg by mouth at bedtime.   [name] venlafaxine XR (EFFEXOR-XR) 75 MG 24 hr capsule Take 75 mg by mouth daily.  . [  DISCONTINUED] carvedilol (COREG) 12.5 MG tablet TAKE 1 AND 1/2 TABLETS BY  MOUTH TWO TIMES DAILY WITH  MEALS     Allergies:   Sulfa antibiotics   Social History   Socioeconomic History  . Marital status: Married    Spouse name: Not on file  . Number of children: Not on file  . Years of education: Not on file  . Highest education level: Not on file  Occupational History  . Not on file  Social Needs  . Financial resource strain: Not on file  . Food insecurity:    Worry: Not on file    Inability: Not on file  . Transportation needs:    Medical: Not on file    Non-medical: Not on file  Tobacco Use  . Smoking status: Current Every Day Smoker    Packs/day: 1.00    Years: 48.00    Pack years: 48.00    Types: Cigars  . Smokeless tobacco: Never Used  Substance and Sexual Activity  . Alcohol use: Yes    Alcohol/week:  7.0 standard drinks    Types: 7 drink(s) per week  . Drug use: No  . Sexual activity: Not on file  Lifestyle  . Physical activity:    Days per week: Not on file    Minutes per session: Not on file  . Stress: Not on file  Relationships  . Social connections:    Talks on phone: Not on file    Gets together: Not on file    Attends religious service: Not on file    Active member of club or organization: Not on file    Attends meetings of clubs or organizations: Not on file    Relationship status: Not on file  Other Topics Concern  . Not on file  Social History Narrative  . Not on file     Family History: The patient's family history includes Cancer in his mother.  ROS:   Please see the history of present illness.    Irritable bowel symptoms.  All other systems reviewed and are negative.  EKGs/Labs/Other Studies Reviewed:    The following studies were reviewed today: No new data  EKG:  EKG is  ordered today.  The ekg ordered today demonstrates normal sinus rhythm, left ventricular hypertrophy, nonspecific ST abnormality.  When compared to prior.  No change when compared to tracing from September 2018.  Recent Labs: No results found for requested labs within last 8760 hours.  Recent Lipid Panel No results found for: CHOL, TRIG, HDL, CHOLHDL, VLDL, LDLCALC, LDLDIRECT  Physical Exam:    VS:  BP (!) 152/84   Pulse 74   Ht 5\' 11"  (1.803 m)   Wt 152 lb (68.9 kg)   BMI 21.20 kg/m     Wt Readings from Last 3 Encounters:  08/13/18 152 lb (68.9 kg)  07/12/17 154 lb 3.2 oz (69.9 kg)  01/01/17 141 lb (64 kg)     GEN: Slender but frail well nourished, well developed in no acute distress HEENT: Normal NECK: No JVD. LYMPHATICS: No lymphadenopathy CARDIAC: RRR, 1/6 systolic murmur, no gallop, no edema. VASCULAR: Absent pulses in both lower extremities below the knee.  There is cyanotic discoloration in both feet.  No carotid bruits. RESPIRATORY:  Clear to auscultation  without rales, wheezing or rhonchi  ABDOMEN: Soft, non-tender, non-distended, No pulsatile mass, MUSCULOSKELETAL: No deformity  SKIN: Warm and dry NEUROLOGIC:  Alert and oriented x 3 PSYCHIATRIC:  Normal affect   ASSESSMENT:  1. Chronic combined systolic and diastolic heart failure ([place])   2. Essential hypertension   3. Tobacco use disorder   4. Peripheral vascular disease ([place])   5. Mixed hyperlipidemia    PLAN:    In order of problems listed above:  1. No evidence of volume overload 2. Blood pressure is mildly elevated. 3. Continues 4. Limiting claudication now and left calf.  Bilateral lower extremity arterial Doppler evaluation and referral to Dr. Jacqulyn Cane 5. LDL target less than 70.  Current is 107.  He is on moderate intensity statin therapy.  We will need to further intensify therapy.  We will need evaluation of PAD.  Bilateral arterial Doppler study.  Establish with colleague for follow-up/evaluation.  Clinical follow-up with me in 1 year.  The natural history of PAD and the concern for tissue loss if critical arterial insufficiency.  Greater than 50% of this office visit was spent in education and coordination of care.   Medication Adjustments/Labs and Tests Ordered: Current medicines are reviewed at length with the patient today.  Concerns regarding medicines are outlined above.  Orders Placed This Encounter  Procedures  . EKG 12-Lead   Meds ordered this encounter  Medications  . carvedilol (COREG) 12.5 MG tablet    Sig: Take one and a half (1 1/2) tablets (18.75 mg) by mouth twice daily.    Dispense:  270 tablet    Refill:  3    Patient Instructions  Your physician recommends that you continue on your current medications as directed. Please refer to the Current Medication list given to you today.   Your physician has requested that you have a lower or upper extremity arterial duplex. This test is an ultrasound of the arteries in the legs or arms. It  looks at arterial blood flow in the legs and arms. Allow one hour for Lower and Upper Arterial scans. There are no restrictions or special instructions  Your physician recommends that you schedule a follow-up appointment in:  [place] wants you to follow-up in: [place] will receive a reminder letter in the mail two months in advance. If you don't receive a letter, please call our office to schedule the follow-up appointment.     Signed, Sinclair Grooms, MD  08/13/2018 12:54 PM    [place]

## 2018-08-13 ENCOUNTER — Ambulatory Visit (INDEPENDENT_AMBULATORY_CARE_PROVIDER_SITE_OTHER): Payer: Medicare Other | Admitting: Interventional Cardiology

## 2018-08-13 ENCOUNTER — Encounter: Payer: Self-pay | Admitting: Interventional Cardiology

## 2018-08-13 ENCOUNTER — Other Ambulatory Visit: Payer: Self-pay | Admitting: Interventional Cardiology

## 2018-08-13 VITALS — BP 152/84 | HR 74 | Ht 71.0 in | Wt 152.0 lb

## 2018-08-13 DIAGNOSIS — I10 Essential (primary) hypertension: Secondary | ICD-10-CM

## 2018-08-13 DIAGNOSIS — E782 Mixed hyperlipidemia: Secondary | ICD-10-CM

## 2018-08-13 DIAGNOSIS — I739 Peripheral vascular disease, unspecified: Secondary | ICD-10-CM | POA: Diagnosis not present

## 2018-08-13 DIAGNOSIS — I5042 Chronic combined systolic (congestive) and diastolic (congestive) heart failure: Secondary | ICD-10-CM | POA: Diagnosis not present

## 2018-08-13 DIAGNOSIS — F17200 Nicotine dependence, unspecified, uncomplicated: Secondary | ICD-10-CM

## 2018-08-13 MED ORDER — CARVEDILOL 12.5 MG PO TABS: ORAL_TABLET | ORAL | 3 refills | Status: DC

## 2018-08-13 NOTE — Patient Instructions (Addendum)
Your physician recommends that you continue on your current medications as directed. Please refer to the Current Medication list given to you today.   Your physician has requested that you have a lower or upper extremity arterial duplex. This test is an ultrasound of the arteries in the legs or arms. It looks at arterial blood flow in the legs and arms. Allow one hour for Lower and Upper Arterial scans. There are no restrictions or special instructions  Your physician recommends that you schedule a follow-up appointment in:  [place] wants you to follow-up in: [place] will receive a reminder letter in the mail two months in advance. If you don't receive a letter, please call our office to schedule the follow-up appointment.

## 2018-08-20 ENCOUNTER — Ambulatory Visit (HOSPITAL_COMMUNITY)
Admission: RE | Admit: 2018-08-20 | Discharge: 2018-08-20 | Disposition: A | Discharge status: Home / Self Care | Payer: Medicare Other | Source: Ambulatory Visit | Attending: Internal Medicine | Admitting: Internal Medicine

## 2018-08-20 DIAGNOSIS — I739 Peripheral vascular disease, unspecified: Secondary | ICD-10-CM | POA: Diagnosis not present

## 2018-08-26 ENCOUNTER — Encounter: Payer: Self-pay | Admitting: Cardiovascular Disease

## 2018-08-26 ENCOUNTER — Ambulatory Visit (INDEPENDENT_AMBULATORY_CARE_PROVIDER_SITE_OTHER): Payer: Medicare Other | Admitting: Cardiovascular Disease

## 2018-08-26 VITALS — BP 122/82 | HR 72 | Ht 71.0 in | Wt 152.6 lb

## 2018-08-26 DIAGNOSIS — I739 Peripheral vascular disease, unspecified: Secondary | ICD-10-CM

## 2018-08-26 DIAGNOSIS — I10 Essential (primary) hypertension: Secondary | ICD-10-CM | POA: Diagnosis not present

## 2018-08-26 DIAGNOSIS — Z720 Tobacco use: Secondary | ICD-10-CM | POA: Diagnosis not present

## 2018-08-26 DIAGNOSIS — E782 Mixed hyperlipidemia: Secondary | ICD-10-CM | POA: Diagnosis not present

## 2018-08-26 DIAGNOSIS — I2510 Atherosclerotic heart disease of native coronary artery without angina pectoris: Secondary | ICD-10-CM | POA: Diagnosis not present

## 2018-08-26 MED ORDER — ROSUVASTATIN CALCIUM 40 MG PO TABS: 40.0000 mg | ORAL_TABLET | Freq: Every day | ORAL | 3 refills | Status: DC

## 2018-08-26 NOTE — Patient Instructions (Signed)
Medication Instructions:  Your physician has recommended you make the following change in your medication:  1) STOP Simvastatin. 2) START Rosuvastatin (Crestor) 40mg  daily. An Rx has been sent to your pharmacy.  If you need a refill on your cardiac medications before your next appointment, please call your pharmacy.   Lab work: Art gallery manager, Psychologist, occupational today  Your physician recommends that you return for a FASTING lipid profile and hepatic in 6 weeks.  If you have labs (blood work) drawn today and your tests are completely normal, you will receive your results only by: [name] MyChart Message (if you have MyChart) OR . A paper copy in the mail If you have any lab test that is abnormal or we need to change your treatment, we will call you to review the results.  Testing/Procedures: Your physician has requested that you have an abdominal aortagram. Please see your pre-procedure instructions below.  Follow-Up: At [facility], you and your health needs are our priority.  As part of our continuing mission to provide you with exceptional heart care, we have created designated Provider Care Teams.  These Care Teams include your primary Cardiologist (physician) and Advanced Practice Providers (APPs -  Physician Assistants and Nurse Practitioners) who all work together to provide you with the care you need, when you need it. Your physician recommends that you schedule a follow-up appointment after the pre-procedure   Any Other Special Instructions Will Be Listed Below (If Applicable).    [address] [place] [address] Dept: [phone] Loc: [place]  08/26/2018  You are scheduled for a Peripheral Angiogram on Wednesday, November 6 with Dr. Kathlyn [place].  1. Please arrive at the [facility] (Main Entrance A) at [facility]: [address] [place], [place] [address] at  8:30 AM (This time is two hours before your procedure to ensure your preparation). Free valet parking service is available.   Special note: Every effort is made to have your procedure done on time. Please understand that emergencies sometimes delay scheduled procedures.  2. Diet: Do not eat solid foods after midnight.  The patient may have clear liquids until 5am upon the day of the procedure.  3. Labs: You will need to have blood drawn today  4. Medication instructions in preparation for your procedure:   Contrast Allergy: No    Current Outpatient Medications (Cardiovascular):  .  carvedilol (COREG) 12.5 MG tablet, Take one and a half (1 1/2) tablets (18.75 mg) by mouth twice daily. .  rosuvastatin (CRESTOR) 40 MG tablet, Take 1 tablet (40 mg total) by mouth daily.   Current Outpatient Medications (Analgesics):  .  acetaminophen (TYLENOL) 500 MG tablet, Take 1,000 mg by mouth every 8 (eight) hours as needed for mild pain, fever or headache.   Current Outpatient Medications (Hematological):  .  clopidogrel (PLAVIX) 75 MG tablet, Take 1 tablet (75 mg total) by mouth daily with breakfast.  Current Outpatient Medications (Other):  .  mirabegron ER (MYRBETRIQ) 50 MG TB24 tablet, Take 50 mg by mouth daily. .  Multiple Vitamins-Minerals (ONE-A-DAY MENS 50+ ADVANTAGE PO), Take 1 tablet by mouth daily.  [name]  PARoxetine (PAXIL) 40 MG tablet, Take 40 mg by mouth daily. [name]  venlafaxine XR (EFFEXOR-XR) 75 MG 24 hr capsule, Take 75 mg by mouth daily. *For reference purposes while preparing patient instructions.   Delete this med list prior to printing instructions for patient.*  On the morning of your procedure, take your Plavix/Clopidogrel and any morning medicines NOT listed above.  You may use sips of water.  5. Plan for one night stay--bring personal belongings. 6. Bring a current list of your medications and current insurance cards. 7. You MUST have a responsible person to drive you  home. 8. Someone MUST be with you the first 24 hours after you arrive home or your discharge will be delayed. 9. Please wear clothes that are easy to get on and off and wear slip-on shoes.  Thank you for allowing [place] to care for you!   -- [facility]

## 2018-08-26 NOTE — Progress Notes (Signed)
Cardiology Office Note   Date:  08/26/2018   ID:  [name], DOB 1940/11/12, MRN [id number]  PCP:  Jonathon Jordan, MD  Cardiologist:  Dr. Tamala Julian  Chief Complaint  Patient presents with  . Follow-up    LEA, pt c/o pain in legs      History of Present Illness: Nicholas Herrera is a 77 y.o. male who is here today for a follow-up visit regarding peripheral arterial disease. He has known history of coronary artery disease, chronic systolic/diastolic heart failure, tobacco use and hypertension. He has known history of peripheral arterial disease with previous revascularization of the right proximal popliteal artery using a 6 x 100 mm self-expanding stent in 2014. Subsequent studies showed occluded right popliteal artery stent with 2 vessel runoff below the knee and mildly reduced ABI. Medical therapy was recommended with a walking program and the patient's symptoms remain stable. He was hospitalized in 2018 for diverticulitis and had chronic bowel issues since then which has limited his physical activities.  Over the last 6 months, he started having severe left calf discomfort with walking which is much worse than the right calf discomfort.  This has progressed significantly and now it happening after walking less than 100 feet.  Patient has not been able to take his dog out for a walk due to his symptoms and feels very limited by his claudication.  He has no rest pain.  Unfortunately, he continues to smoke 1 pack/day and does not think he can quit. No recent chest pain.  Shortness of breath is stable. He takes his medications regularly.  He is chronically on Plavix.  He reports that aspirin was discontinued few years ago after he had hematuria. The patient was seen recently by Dr. Tamala Julian and underwent repeat vascular testing which showed stable ABI on the right side.  However, there was significant drop in ABI on the left side to 0.65 with evidence of high-grade stenosis in the distal left  SFA versus occlusion.  Past Medical History:  Diagnosis Date  . Abdominal distention   . Abdominal pain   . Bronchitis    hx of  . Cancer ([place])    skin  . CHF (congestive heart failure) (HCC)     (hx of EF 27%) s/p AICD 2004. Most recent LVEF > 40% , 8/11  . Chronic kidney disease    has small stones, no treatment  . Cough   . Depression    takes paxil  . Diverticulosis    hx of  . Dysrhythmia   . Easy bruising   . Hematuria    hx of  . Hernia october 2012   [facility]  . HTN (hypertension)   . Hyperlipidemia    takes zocor  . Inguinal hernia   . Left groin pain     Past Surgical History:  Procedure Laterality Date  . APPENDECTOMY    . CARDIAC CATHETERIZATION    . CARDIAC DEFIBRILLATOR REMOVAL  2005  . HERNIA REPAIR  09/11/11   repair of LIH   . INGUINAL HERNIA REPAIR  09/11/2011   Procedure: HERNIA REPAIR INGUINAL ADULT;  Surgeon: Judieth Keens, DO;  Location: [place];  Service: General;  Laterality: Left;  open left inguinal hernia with mesh  . LOWER EXTREMITY ANGIOGRAM N/A 02/05/2013   Procedure: LOWER EXTREMITY ANGIOGRAM;  Surgeon: Jettie Booze, MD;  Location: [facility] C[facility] LAB;  Service: Cardiovascular;  Laterality: N/A;     Current Outpatient Medications  Medication  Sig Dispense Refill  . acetaminophen (TYLENOL) 500 MG tablet Take 1,000 mg by mouth every 8 (eight) hours as needed for mild pain, fever or headache.     . carvedilol (COREG) 12.5 MG tablet Take one and a half (1 1/2) tablets (18.75 mg) by mouth twice daily. 270 tablet 3  . clopidogrel (PLAVIX) 75 MG tablet Take 1 tablet (75 mg total) by mouth daily with breakfast. 30 tablet 11  . mirabegron ER (MYRBETRIQ) 50 MG TB24 tablet Take 50 mg by mouth daily.    . Multiple Vitamins-Minerals (ONE-A-DAY MENS 50+ ADVANTAGE PO) Take 1 tablet by mouth daily.     [name] PARoxetine (PAXIL) 40 MG tablet Take 40 mg by mouth daily.    . simvastatin (ZOCOR) 40 MG tablet Take 40 mg by mouth at bedtime.     [name] venlafaxine XR  (EFFEXOR-XR) 75 MG 24 hr capsule Take 75 mg by mouth daily.     No current facility-administered medications for this visit.     Allergies:   Sulfa antibiotics    Social History:  The patient  reports that he has been smoking cigars. He has a 48.00 pack-year smoking history. He has never used smokeless tobacco. He reports that he drinks about 7.0 standard drinks of alcohol per week. He reports that he does not use drugs.   Family History:  The patient's family history includes Cancer in his mother.    ROS:  Please see the history of present illness.   Otherwise, review of systems are positive for none.   All other systems are reviewed and negative.    PHYSICAL EXAM: VS:  BP 122/82   Pulse 72   Ht 5\' 11"  (1.803 m)   Wt 152 lb 9.6 oz (69.2 kg)   SpO2 95%   BMI 21.28 kg/m  , BMI Body mass index is 21.28 kg/m. GEN: Well nourished, well developed, in no acute distress  HEENT: normal  Neck: no JVD, carotid bruits, or masses Cardiac: RRR; no murmurs, rubs, or gallops,no edema  Respiratory:  clear to auscultation bilaterally, normal work of breathing GI: soft, nontender, nondistended, + BS MS: no deformity or atrophy  Skin: warm and dry, no rash Neuro:  Strength and sensation are intact Psych: euthymic mood, full affect   EKG:  EKG is not ordered today.    Recent Labs: No results found for requested labs within last 8760 hours.    Lipid Panel No results found for: CHOL, TRIG, HDL, CHOLHDL, VLDL, LDLCALC, LDLDIRECT    Wt Readings from Last 3 Encounters:  08/26/18 152 lb 9.6 oz (69.2 kg)  08/13/18 152 lb (68.9 kg)  07/12/17 154 lb 3.2 oz (69.9 kg)       No flowsheet data found.    ASSESSMENT AND PLAN:  1.  Peripheral arterial disease: The patient has Mild to moderate right calf claudication due to an occluded right popliteal artery stent.  Cilostazol is contraindicated due to history of heart failure.  Since last evaluation by me, the patient has developed severe  lifestyle limiting left calf claudication with evidence of distal SFA disease.  He feels extremely limited by his symptoms.  Due to lifestyle limiting claudication, I recommend proceeding with abdominal aortogram with lower extremity runoff and possible endovascular intervention with focus on the left leg. Planned access via the right common femoral artery.  I discussed the procedure in details as well as risks and benefits. The patient can take aspirin if needed based on revascularization.  He  is now on Plavix.   2. Tobacco use: I again discussed with him the importance of smoking cessation but he reports inability to quit.  3. Hyperlipidemia: I reviewed most recent lipid profile done in January which showed an LDL of 107.  Given his known history of coronary artery disease and peripheral arterial disease, I think we should be more aggressive with his LDL target.  Thus, I elected to switch him from simvastatin to rosuvastatin 40 mg daily.  Repeat labs in 6 weeks.  4. Coronary artery disease: No anginal symptoms.   5. Chronic systolic heart failure: Stable on medications.    Disposition:   FU with me in 1 months  Signed,  Kathlyn Sacramento, MD  08/26/2018 9:20 AM    [place]

## 2018-08-26 NOTE — H&P (View-Only) (Signed)
Cardiology Office Note   Date:  08/26/2018   ID:  Nicholas Herrera, DOB January 10, 1941, MRN [id number]  PCP:  Jonathon Jordan, MD  Cardiologist:  Dr. Tamala Julian  Chief Complaint  Patient presents with  . Follow-up    LEA, pt c/o pain in legs      History of Present Illness: Nicholas Herrera is a 77 y.o. male who is here today for a follow-up visit regarding peripheral arterial disease. He has known history of coronary artery disease, chronic systolic/diastolic heart failure, tobacco use and hypertension. He has known history of peripheral arterial disease with previous revascularization of the right proximal popliteal artery using a 6 x 100 mm self-expanding stent in 2014. Subsequent studies showed occluded right popliteal artery stent with 2 vessel runoff below the knee and mildly reduced ABI. Medical therapy was recommended with a walking program and the patient's symptoms remain stable. He was hospitalized in 2018 for diverticulitis and had chronic bowel issues since then which has limited his physical activities.  Over the last 6 months, he started having severe left calf discomfort with walking which is much worse than the right calf discomfort.  This has progressed significantly and now it happening after walking less than 100 feet.  Patient has not been able to take his dog out for a walk due to his symptoms and feels very limited by his claudication.  He has no rest pain.  Unfortunately, he continues to smoke 1 pack/day and does not think he can quit. No recent chest pain.  Shortness of breath is stable. He takes his medications regularly.  He is chronically on Plavix.  He reports that aspirin was discontinued few years ago after he had hematuria. The patient was seen recently by Dr. Tamala Julian and underwent repeat vascular testing which showed stable ABI on the right side.  However, there was significant drop in ABI on the left side to 0.65 with evidence of high-grade stenosis in the distal left  SFA versus occlusion.  Past Medical History:  Diagnosis Date  . Abdominal distention   . Abdominal pain   . Bronchitis    hx of  . Cancer ([place])    skin  . CHF (congestive heart failure) (HCC)     (hx of EF 27%) s/p AICD 2004. Most recent LVEF > 40% , 8/11  . Chronic kidney disease    has small stones, no treatment  . Cough   . Depression    takes paxil  . Diverticulosis    hx of  . Dysrhythmia   . Easy bruising   . Hematuria    hx of  . Hernia october 2012   [facility]  . HTN (hypertension)   . Hyperlipidemia    takes zocor  . Inguinal hernia   . Left groin pain     Past Surgical History:  Procedure Laterality Date  . APPENDECTOMY    . CARDIAC CATHETERIZATION    . CARDIAC DEFIBRILLATOR REMOVAL  2005  . HERNIA REPAIR  09/11/11   repair of LIH   . INGUINAL HERNIA REPAIR  09/11/2011   Procedure: HERNIA REPAIR INGUINAL ADULT;  Surgeon: Judieth Keens, DO;  Location: [place];  Service: General;  Laterality: Left;  open left inguinal hernia with mesh  . LOWER EXTREMITY ANGIOGRAM N/A 02/05/2013   Procedure: LOWER EXTREMITY ANGIOGRAM;  Surgeon: Jettie Booze, MD;  Location[facility] C[facility] LAB;  Service: Cardiovascular;  Laterality: N/A;     Current Outpatient Medications  Medication  Sig Dispense Refill  . acetaminophen (TYLENOL) 500 MG tablet Take 1,000 mg by mouth every 8 (eight) hours as needed for mild pain, fever or headache.     . carvedilol (COREG) 12.5 MG tablet Take one and a half (1 1/2) tablets (18.75 mg) by mouth twice daily. 270 tablet 3  . clopidogrel (PLAVIX) 75 MG tablet Take 1 tablet (75 mg total) by mouth daily with breakfast. 30 tablet 11  . mirabegron ER (MYRBETRIQ) 50 MG TB24 tablet Take 50 mg by mouth daily.    . Multiple Vitamins-Minerals (ONE-A-DAY MENS 50+ ADVANTAGE PO) Take 1 tablet by mouth daily.     [name] PARoxetine (PAXIL) 40 MG tablet Take 40 mg by mouth daily.    . simvastatin (ZOCOR) 40 MG tablet Take 40 mg by mouth at bedtime.     [name] venlafaxine XR  (EFFEXOR-XR) 75 MG 24 hr capsule Take 75 mg by mouth daily.     No current facility-administered medications for this visit.     Allergies:   Sulfa antibiotics    Social History:  The patient  reports that he has been smoking cigars. He has a 48.00 pack-year smoking history. He has never used smokeless tobacco. He reports that he drinks about 7.0 standard drinks of alcohol per week. He reports that he does not use drugs.   Family History:  The patient's family history includes Cancer in his mother.    ROS:  Please see the history of present illness.   Otherwise, review of systems are positive for none.   All other systems are reviewed and negative.    PHYSICAL EXAM: VS:  BP 122/82   Pulse 72   Ht 5\' 11"  (1.803 m)   Wt 152 lb 9.6 oz (69.2 kg)   SpO2 95%   BMI 21.28 kg/m  , BMI Body mass index is 21.28 kg/m. GEN: Well nourished, well developed, in no acute distress  HEENT: normal  Neck: no JVD, carotid bruits, or masses Cardiac: RRR; no murmurs, rubs, or gallops,no edema  Respiratory:  clear to auscultation bilaterally, normal work of breathing GI: soft, nontender, nondistended, + BS MS: no deformity or atrophy  Skin: warm and dry, no rash Neuro:  Strength and sensation are intact Psych: euthymic mood, full affect   EKG:  EKG is not ordered today.    Recent Labs: No results found for requested labs within last 8760 hours.    Lipid Panel No results found for: CHOL, TRIG, HDL, CHOLHDL, VLDL, LDLCALC, LDLDIRECT    Wt Readings from Last 3 Encounters:  08/26/18 152 lb 9.6 oz (69.2 kg)  08/13/18 152 lb (68.9 kg)  07/12/17 154 lb 3.2 oz (69.9 kg)       No flowsheet data found.    ASSESSMENT AND PLAN:  1.  Peripheral arterial disease: The patient has Mild to moderate right calf claudication due to an occluded right popliteal artery stent.  Cilostazol is contraindicated due to history of heart failure.  Since last evaluation by me, the patient has developed severe  lifestyle limiting left calf claudication with evidence of distal SFA disease.  He feels extremely limited by his symptoms.  Due to lifestyle limiting claudication, I recommend proceeding with abdominal aortogram with lower extremity runoff and possible endovascular intervention with focus on the left leg. Planned access via the right common femoral artery.  I discussed the procedure in details as well as risks and benefits. The patient can take aspirin if needed based on revascularization.  He  is now on Plavix.   2. Tobacco use: I again discussed with him the importance of smoking cessation but he reports inability to quit.  3. Hyperlipidemia: I reviewed most recent lipid profile done in January which showed an LDL of 107.  Given his known history of coronary artery disease and peripheral arterial disease, I think we should be more aggressive with his LDL target.  Thus, I elected to switch him from simvastatin to rosuvastatin 40 mg daily.  Repeat labs in 6 weeks.  4. Coronary artery disease: No anginal symptoms.   5. Chronic systolic heart failure: Stable on medications.    Disposition:   FU with me in 1 months  Signed,  Kathlyn Sacramento, MD  08/26/2018 9:20 AM    [place]

## 2018-08-27 ENCOUNTER — Telehealth: Payer: Self-pay | Admitting: Cardiovascular Disease

## 2018-08-27 ENCOUNTER — Encounter (INDEPENDENT_AMBULATORY_CARE_PROVIDER_SITE_OTHER): Payer: Medicare Other | Admitting: Vascular Surgery

## 2018-08-27 ENCOUNTER — Encounter (INDEPENDENT_AMBULATORY_CARE_PROVIDER_SITE_OTHER): Payer: Self-pay

## 2018-08-27 LAB — CBC WITH DIFFERENTIAL/PLATELET
Basophils Absolute: 0.1 10*3/uL (ref 0.0–0.2)
Basos: 1 %
EOS (ABSOLUTE): 0.2 10*3/uL (ref 0.0–0.4)
Eos: 3 %
Hematocrit: 44.7 % (ref 37.5–51.0)
Hemoglobin: 15.3 g/dL (ref 13.0–17.7)
Immature Grans (Abs): 0 10*3/uL (ref 0.0–0.1)
Immature Granulocytes: 0 %
Lymphocytes Absolute: 2.7 10*3/uL (ref 0.7–3.1)
Lymphs: 31 %
MCH: 32.3 pg (ref 26.6–33.0)
MCHC: 34.2 g/dL (ref 31.5–35.7)
MCV: 95 fL (ref 79–97)
Monocytes Absolute: 0.7 10*3/uL (ref 0.1–0.9)
Monocytes: 8 %
Neutrophils Absolute: 5 10*3/uL (ref 1.4–7.0)
Neutrophils: 57 %
Platelets: 242 10*3/uL (ref 150–450)
RBC: 4.73 x10E6/uL (ref 4.14–5.80)
RDW: 12.8 % (ref 12.3–15.4)
WBC: 8.7 10*3/uL (ref 3.4–10.8)

## 2018-08-27 LAB — BASIC METABOLIC PANEL
BUN/Creatinine Ratio: 13 (ref 10–24)
BUN: 13 mg/dL (ref 8–27)
CO2: 23 mmol/L (ref 20–29)
Calcium: 9.7 mg/dL (ref 8.6–10.2)
Chloride: 99 mmol/L (ref 96–106)
Creatinine, Ser: 0.98 mg/dL (ref 0.76–1.27)
GFR calc Af Amer: 87 mL/min/{1.73_m2} (ref >59)
GFR calc non Af Amer: 75 mL/min/{1.73_m2} (ref >59)
Glucose: 100 mg/dL — ABNORMAL HIGH (ref 65–99)
Potassium: 4.9 mmol/L (ref 3.5–5.2)
Sodium: 140 mmol/L (ref 134–144)

## 2018-08-27 NOTE — Telephone Encounter (Signed)
I am not sure who made that appointment.  He does not need to see that provider as he is my patient.

## 2018-08-27 NOTE — Telephone Encounter (Signed)
New message:      Pt is calling to see if the appt for today in [place] was ordered by [place] or was it a scheduling error. He states he doesn't remember the dr speaking of these test. Please advise. The appts were cancelled on yesterday and marked as a scheduling error.

## 2018-08-27 NOTE — Telephone Encounter (Signed)
Returned call to patient.He stated he saw Dr.Arida yesterday and PV procedure scheduled for next week.Stated he noticed at top of after visit summary he had vascular appointment with Marcelle Overlie NP in [place] this morning.He wanted to make sure he did not need to see her.Stated he was not aware of that appointment and he does not know who scheduled.Message sent to [place].

## 2018-08-27 NOTE — Telephone Encounter (Signed)
Patient aware and verbalized understanding. °

## 2018-09-01 ENCOUNTER — Telehealth: Payer: Self-pay | Admitting: *Deleted

## 2018-09-01 NOTE — Telephone Encounter (Signed)
Pt contacted pre-PV procedure  scheduled at [facility] for: Wednesday November 6,2019 10:30 AM Verified arrival time and place: [place] Entrance A at: 8AM  No solid food after midnight prior to cath, clear liquids until 5 AM day of procedure. Contrast allergy: no Verified no diabetes medications.  AM meds can be  taken pre-cath with sip of water including: ASA 81 mg Clopidogrel 75 mg  Confirmed patient has responsible person to drive home post procedure and for 24 hours after you arrive home: yes

## 2018-09-03 ENCOUNTER — Ambulatory Visit (HOSPITAL_COMMUNITY)
Admission: RE | Admit: 2018-09-03 | Discharge: 2018-09-03 | Disposition: A | Discharge status: Home / Self Care | Payer: Medicare Other | Source: Ambulatory Visit | Attending: Cardiovascular Disease | Admitting: Cardiovascular Disease

## 2018-09-03 ENCOUNTER — Other Ambulatory Visit: Payer: Self-pay

## 2018-09-03 ENCOUNTER — Ambulatory Visit (HOSPITAL_COMMUNITY)
Admission: RE | Disposition: A | Discharge status: Home / Self Care | Payer: Self-pay | Source: Ambulatory Visit | Attending: Cardiovascular Disease

## 2018-09-03 DIAGNOSIS — I70212 Atherosclerosis of native arteries of extremities with intermittent claudication, left leg: Secondary | ICD-10-CM | POA: Insufficient documentation

## 2018-09-03 DIAGNOSIS — Z882 Allergy status to sulfonamides status: Secondary | ICD-10-CM | POA: Insufficient documentation

## 2018-09-03 DIAGNOSIS — N189 Chronic kidney disease, unspecified: Secondary | ICD-10-CM | POA: Diagnosis not present

## 2018-09-03 DIAGNOSIS — Z7902 Long term (current) use of antithrombotics/antiplatelets: Secondary | ICD-10-CM | POA: Insufficient documentation

## 2018-09-03 DIAGNOSIS — Z79899 Other long term (current) drug therapy: Secondary | ICD-10-CM | POA: Diagnosis not present

## 2018-09-03 DIAGNOSIS — I2510 Atherosclerotic heart disease of native coronary artery without angina pectoris: Secondary | ICD-10-CM | POA: Insufficient documentation

## 2018-09-03 DIAGNOSIS — F17290 Nicotine dependence, other tobacco product, uncomplicated: Secondary | ICD-10-CM | POA: Insufficient documentation

## 2018-09-03 DIAGNOSIS — I5042 Chronic combined systolic (congestive) and diastolic (congestive) heart failure: Secondary | ICD-10-CM | POA: Insufficient documentation

## 2018-09-03 DIAGNOSIS — Z9889 Other specified postprocedural states: Secondary | ICD-10-CM | POA: Diagnosis not present

## 2018-09-03 DIAGNOSIS — I130 Hypertensive heart and chronic kidney disease with heart failure and stage 1 through stage 4 chronic kidney disease, or unspecified chronic kidney disease: Secondary | ICD-10-CM | POA: Diagnosis not present

## 2018-09-03 DIAGNOSIS — Z85828 Personal history of other malignant neoplasm of skin: Secondary | ICD-10-CM | POA: Insufficient documentation

## 2018-09-03 DIAGNOSIS — E785 Hyperlipidemia, unspecified: Secondary | ICD-10-CM | POA: Insufficient documentation

## 2018-09-03 DIAGNOSIS — Z955 Presence of coronary angioplasty implant and graft: Secondary | ICD-10-CM | POA: Insufficient documentation

## 2018-09-03 DIAGNOSIS — I739 Peripheral vascular disease, unspecified: Secondary | ICD-10-CM

## 2018-09-03 DIAGNOSIS — Z809 Family history of malignant neoplasm, unspecified: Secondary | ICD-10-CM | POA: Insufficient documentation

## 2018-09-03 HISTORY — PX: ABDOMINAL AORTOGRAM W/LOWER EXTREMITY: CATH118223

## 2018-09-03 HISTORY — PX: PERIPHERAL VASCULAR INTERVENTION: CATH118257

## 2018-09-03 LAB — POCT ACTIVATED CLOTTING TIME
Activated Clotting Time: 241 seconds
Activated Clotting Time: 274 seconds

## 2018-09-03 SURGERY — ABDOMINAL AORTOGRAM W/LOWER EXTREMITY
Anesthesia: LOCAL

## 2018-09-03 MED ORDER — IODIXANOL 320 MG/ML IV SOLN
INTRAVENOUS | Status: DC | PRN
  Administered 2018-09-03: 125 mL via INTRA_ARTERIAL

## 2018-09-03 MED ORDER — HYDRALAZINE HCL 20 MG/ML IJ SOLN
INTRAMUSCULAR | Status: DC | PRN
  Administered 2018-09-03: 10 mg via INTRAVENOUS

## 2018-09-03 MED ORDER — MIDAZOLAM HCL 2 MG/2ML IJ SOLN
INTRAMUSCULAR | Status: DC | PRN
  Administered 2018-09-03: 1 mg via INTRAVENOUS

## 2018-09-03 MED ORDER — MIDAZOLAM HCL 2 MG/2ML IJ SOLN
INTRAMUSCULAR | Status: AC
  Filled 2018-09-03: qty 2

## 2018-09-03 MED ORDER — NITROGLYCERIN IN D5W 200-5 MCG/ML-% IV SOLN
INTRAVENOUS | Status: AC
  Filled 2018-09-03: qty 250

## 2018-09-03 MED ORDER — SODIUM CHLORIDE 0.9 % IV SOLN: 250.0000 mL | INTRAVENOUS | Status: DC | PRN

## 2018-09-03 MED ORDER — SODIUM CHLORIDE 0.9% FLUSH: 3.0000 mL | INTRAVENOUS | Status: DC | PRN

## 2018-09-03 MED ORDER — FENTANYL CITRATE (PF) 100 MCG/2ML IJ SOLN
INTRAMUSCULAR | Status: AC
  Filled 2018-09-03: qty 2

## 2018-09-03 MED ORDER — HEPARIN SODIUM (PORCINE) 1000 UNIT/ML IJ SOLN
INTRAMUSCULAR | Status: AC
  Filled 2018-09-03: qty 1

## 2018-09-03 MED ORDER — HYDRALAZINE HCL 20 MG/ML IJ SOLN
INTRAMUSCULAR | Status: AC
  Filled 2018-09-03: qty 1

## 2018-09-03 MED ORDER — ONDANSETRON HCL 4 MG/2ML IJ SOLN: 4.0000 mg | Freq: Four times a day (QID) | INTRAMUSCULAR | Status: DC | PRN

## 2018-09-03 MED ORDER — HEPARIN (PORCINE) IN NACL 1000-0.9 UT/500ML-% IV SOLN
INTRAVENOUS | Status: DC | PRN
  Administered 2018-09-03 (×2): 500 mL

## 2018-09-03 MED ORDER — ASPIRIN 81 MG PO CHEW: 81.0000 mg | CHEWABLE_TABLET | ORAL | Status: DC

## 2018-09-03 MED ORDER — ACETAMINOPHEN 325 MG PO TABS: 650.0000 mg | ORAL_TABLET | ORAL | Status: DC | PRN

## 2018-09-03 MED ORDER — FENTANYL CITRATE (PF) 100 MCG/2ML IJ SOLN
INTRAMUSCULAR | Status: DC | PRN
  Administered 2018-09-03: 25 ug via INTRAVENOUS

## 2018-09-03 MED ORDER — VERAPAMIL HCL 2.5 MG/ML IV SOLN
INTRAVENOUS | Status: AC
  Filled 2018-09-03: qty 2

## 2018-09-03 MED ORDER — SODIUM CHLORIDE 0.9 % IV SOLN: INTRAVENOUS | Status: DC

## 2018-09-03 MED ORDER — SODIUM CHLORIDE 0.9% FLUSH: 3.0000 mL | Freq: Two times a day (BID) | INTRAVENOUS | Status: DC

## 2018-09-03 MED ORDER — NITROGLYCERIN 1 MG/10 ML FOR IR/CATH LAB
INTRA_ARTERIAL | Status: AC
  Filled 2018-09-03: qty 10

## 2018-09-03 MED ORDER — SODIUM CHLORIDE 0.9 % IV SOLN
INTRAVENOUS | Status: DC
  Administered 2018-09-03: 09:00:00 via INTRAVENOUS

## 2018-09-03 MED ORDER — LIDOCAINE HCL (PF) 1 % IJ SOLN
INTRAMUSCULAR | Status: AC
  Filled 2018-09-03: qty 30

## 2018-09-03 MED ORDER — HEPARIN (PORCINE) IN NACL 1000-0.9 UT/500ML-% IV SOLN
INTRAVENOUS | Status: AC
  Filled 2018-09-03: qty 1000

## 2018-09-03 MED ORDER — HYDRALAZINE HCL 20 MG/ML IJ SOLN: 5.0000 mg | INTRAMUSCULAR | Status: DC | PRN

## 2018-09-03 MED ORDER — HEPARIN SODIUM (PORCINE) 1000 UNIT/ML IJ SOLN
INTRAMUSCULAR | Status: DC | PRN
  Administered 2018-09-03: 2000 [IU] via INTRAVENOUS
  Administered 2018-09-03: 6000 [IU] via INTRAVENOUS

## 2018-09-03 MED ORDER — NITROGLYCERIN 1 MG/10 ML FOR IR/CATH LAB
INTRA_ARTERIAL | Status: DC | PRN
  Administered 2018-09-03 (×4): 200 ug via INTRA_ARTERIAL

## 2018-09-03 MED ORDER — LIDOCAINE HCL (PF) 1 % IJ SOLN
INTRAMUSCULAR | Status: DC | PRN
  Administered 2018-09-03: 20 mL via INTRADERMAL

## 2018-09-03 SURGICAL SUPPLY — 25 items
BALLN LUTONIX DCB 6X100X130 (BALLOONS) ×3
BALLOON LUTONIX DCB 6X100X130 (BALLOONS) ×2 IMPLANT
CATH OMNI FLUSH 5F 65CM (CATHETERS) ×3 IMPLANT
CATH QUICKCROSS .018X135CM (MICROCATHETER) ×3 IMPLANT
CLOSURE MYNX CONTROL 6F/7F (Vascular Products) ×3 IMPLANT
DIAMONDBACK SOLID OAS 2.0MM (CATHETERS) ×3
KIT ENCORE 26 ADVANTAGE (KITS) ×3 IMPLANT
KIT MICROPUNCTURE NIT STIFF (SHEATH) ×3 IMPLANT
KIT PV (KITS) ×3 IMPLANT
LUBRICANT VIPERSLIDE CORONARY (MISCELLANEOUS) ×3 IMPLANT
SHEATH PINNACLE 5F 10CM (SHEATH) ×3 IMPLANT
SHEATH PINNACLE 6F 10CM (SHEATH) ×3 IMPLANT
SHEATH PINNACLE ST 6F 45CM (SHEATH) ×3 IMPLANT
SHEATH PROBE COVER 6X72 (BAG) ×3 IMPLANT
SHIELD RADPAD SCOOP 12X17 (MISCELLANEOUS) ×3 IMPLANT
STOPCOCK MORSE 400PSI 3WAY (MISCELLANEOUS) ×3 IMPLANT
SYR MEDRAD MARK 7 150ML (SYRINGE) ×3 IMPLANT
SYSTEM DIMNDBCK SLD OAS 2.0MM (CATHETERS) ×2 IMPLANT
TAPE VIPERTRACK RADIOPAQ (MISCELLANEOUS) ×2 IMPLANT
TAPE VIPERTRACK RADIOPAQUE (MISCELLANEOUS) ×1
TRANSDUCER W/STOPCOCK (MISCELLANEOUS) ×3 IMPLANT
TRAY PV CATH (CUSTOM PROCEDURE TRAY) ×3 IMPLANT
TUBING CIL FLEX 10 FLL-RA (TUBING) ×3 IMPLANT
WIRE BENTSON .035X145CM (WIRE) ×3 IMPLANT
WIRE VIPER ADVANCE .017X335CM (WIRE) ×3 IMPLANT

## 2018-09-03 NOTE — Discharge Instructions (Signed)

## 2018-09-03 NOTE — Interval H&P Note (Signed)
History and Physical Interval Note:  09/03/2018 1:21 PM  Nicholas Herrera  has presented today for surgery, with the diagnosis of pvd  The various methods of treatment have been discussed with the patient and family. After consideration of risks, benefits and other options for treatment, the patient has consented to  Procedure(s): ABDOMINAL AORTOGRAM W/LOWER EXTREMITY (N/A) as a surgical intervention .  The patient's history has been reviewed, patient examined, no change in status, stable for surgery.  I have reviewed the patient's chart and labs.  Questions were answered to the patient's satisfaction.     Kathlyn Sacramento

## 2018-09-04 ENCOUNTER — Telehealth: Payer: Self-pay | Admitting: *Deleted

## 2018-09-04 ENCOUNTER — Other Ambulatory Visit: Payer: Self-pay | Admitting: Cardiovascular Disease

## 2018-09-04 ENCOUNTER — Encounter (HOSPITAL_COMMUNITY): Payer: Self-pay | Admitting: Cardiovascular Disease

## 2018-09-04 DIAGNOSIS — I739 Peripheral vascular disease, unspecified: Secondary | ICD-10-CM

## 2018-09-04 DIAGNOSIS — Z9862 Peripheral vascular angioplasty status: Secondary | ICD-10-CM

## 2018-09-04 MED FILL — Verapamil HCl IV Soln 2.5 MG/ML: INTRAVENOUS | Qty: 2 | Status: AC

## 2018-09-04 NOTE — Telephone Encounter (Signed)
Post procedure duplex orders placed. Appointment made for 11/11 at 8 am.  Follow up appointment made for 12/3 with Dr. Fletcher Anon.  Patient has verbalized his understanding.

## 2018-09-08 ENCOUNTER — Ambulatory Visit (HOSPITAL_COMMUNITY)
Admission: RE | Admit: 2018-09-08 | Discharge: 2018-09-08 | Disposition: A | Discharge status: Home / Self Care | Payer: Medicare Other | Source: Ambulatory Visit | Attending: Cardiology | Admitting: Cardiology

## 2018-09-08 DIAGNOSIS — I739 Peripheral vascular disease, unspecified: Secondary | ICD-10-CM | POA: Diagnosis not present

## 2018-09-08 DIAGNOSIS — Z9862 Peripheral vascular angioplasty status: Secondary | ICD-10-CM | POA: Diagnosis not present

## 2018-09-10 ENCOUNTER — Telehealth: Payer: Self-pay | Admitting: *Deleted

## 2018-09-10 DIAGNOSIS — Z23 Encounter for immunization: Secondary | ICD-10-CM | POA: Diagnosis not present

## 2018-09-10 DIAGNOSIS — I739 Peripheral vascular disease, unspecified: Secondary | ICD-10-CM

## 2018-09-10 NOTE — Telephone Encounter (Signed)
Patient made aware of results and verbalized understanding. Repeat orders placed.    

## 2018-09-10 NOTE — Telephone Encounter (Signed)
-----   Message from Wellington Hampshire, MD sent at 09/08/2018  5:09 PM EST ----- Inform patient that ABI improved to normal on the left side.  Repeat studies in 6 months.

## 2018-09-18 DIAGNOSIS — K5790 Diverticulosis of intestine, part unspecified, without perforation or abscess without bleeding: Secondary | ICD-10-CM | POA: Diagnosis not present

## 2018-09-30 ENCOUNTER — Ambulatory Visit (INDEPENDENT_AMBULATORY_CARE_PROVIDER_SITE_OTHER): Payer: Medicare Other | Admitting: Cardiovascular Disease

## 2018-09-30 ENCOUNTER — Encounter: Payer: Self-pay | Admitting: Cardiovascular Disease

## 2018-09-30 VITALS — BP 128/80 | HR 63 | Ht 71.0 in | Wt 154.6 lb

## 2018-09-30 DIAGNOSIS — I5022 Chronic systolic (congestive) heart failure: Secondary | ICD-10-CM

## 2018-09-30 DIAGNOSIS — Z720 Tobacco use: Secondary | ICD-10-CM | POA: Diagnosis not present

## 2018-09-30 DIAGNOSIS — E782 Mixed hyperlipidemia: Secondary | ICD-10-CM | POA: Diagnosis not present

## 2018-09-30 DIAGNOSIS — I739 Peripheral vascular disease, unspecified: Secondary | ICD-10-CM | POA: Diagnosis not present

## 2018-09-30 DIAGNOSIS — I2510 Atherosclerotic heart disease of native coronary artery without angina pectoris: Secondary | ICD-10-CM

## 2018-09-30 NOTE — Patient Instructions (Signed)
Medication Instructions:  Your physician recommends that you continue on your current medications as directed. Please refer to the Current Medication list given to you today. If you need a refill on your cardiac medications before your next appointment, please call your pharmacy.   Lab work: Your physician recommends that you return for lab work in: FASTING   If you have labs (blood work) drawn today and your tests are completely normal, you will receive your results only by: [name] MyChart Message (if you have MyChart) OR . A paper copy in the mail If you have any lab test that is abnormal or we need to change your treatment, we will call you to review the results.  Testing/Procedures: Your physician has requested that you have a lower extremity arterial doppler- During this test, ultrasound is used to evaluate arterial blood flow in the legs. Allow approximately one hour for this exam.  Your physician has requested that you have an ankle brachial index (ABI). During this test an ultrasound and blood pressure cuff are used to evaluate the arteries that supply the arms and legs with blood. Allow thirty minutes for this exam. There are no restrictions or special instructions.  SCHEDULE FOR MAY 2020  Follow-Up: At [facility] you and your health needs are our priority.  As part of our continuing mission to provide you with exceptional heart care, we have created designated Provider Care Teams.  These Care Teams include your primary Cardiologist (physician) and Advanced Practice Providers (APPs -  Physician Assistants and Nurse Practitioners) who all work together to provide you with the care you need, when you need it. You will need a follow up appointment in 6 months.  Please call our office 2 months in advance to schedule this appointment.  You may see DR. ARIDA - PV or one of the following Advanced Practice Providers on your designated Care Team:   Kerin Ransom, PA-C Roby Lofts,  [place] . Sande Rives, PA-C  Any Other Special Instructions Will Be Listed Below (If Applicable).

## 2018-09-30 NOTE — Progress Notes (Signed)
Cardiology Office Note   Date:  09/30/2018   ID:  Nicholas Herrera, DOB Feb 09, 1941, MRN [id number]  PCP:  Jonathon Jordan, MD  Cardiologist:  Dr. Tamala Julian  Chief Complaint  Patient presents with  . Follow-up    pt denied chest pain and SOB      History of Present Illness: Nicholas Herrera is a 77 y.o. male who is here today for a follow-up visit regarding peripheral arterial disease. He has known history of coronary artery disease, chronic systolic/diastolic heart failure, tobacco use and hypertension. He has known history of peripheral arterial disease with previous revascularization of the right proximal popliteal artery using a 6 x 100 mm self-expanding stent in 2014. Subsequent studies showed occluded right popliteal artery stent with 2 vessel runoff below the knee and mildly reduced ABI. Medical therapy was recommended with a walking program and the patient's symptoms remain stable. He was hospitalized in 2018 for diverticulitis and had chronic bowel issues since then which has limited his physical activities.  Over the last 6 months, he started having severe left calf discomfort with walking which is much worse than the right calf discomfort.  This has progressed significantly and now it happening after walking less than 100 feet.  Patient has not been able to take his dog out for a walk due to his symptoms and feels very limited by his claudication.  He has no rest pain.  Unfortunately, he continues to smoke 1 pack/day and does not think he can quit. No recent chest pain.  Shortness of breath is stable. He was seen recently for significant worsening of left calf claudication with a drop in ABI.  I proceeded with angiography which showed focal occlusion in the distal left SFA which was heavily calcified.  It was treated successfully with orbital atherectomy and drug-coated balloon angioplasty.  He reports resolution of left Calf claudication.  ABI improved to normal.  He continues to have  right calf claudication as expected.   Past Medical History:  Diagnosis Date  . Abdominal distention   . Abdominal pain   . Bronchitis    hx of  . Cancer ([place])    skin  . CHF (congestive heart failure) (HCC)     (hx of EF 27%) s/p AICD 2004. Most recent LVEF > 40% , 8/11  . Chronic kidney disease    has small stones, no treatment  . Cough   . Depression    takes paxil  . Diverticulosis    hx of  . Dysrhythmia   . Easy bruising   . Hematuria    hx of  . Hernia october 2012   [facility]  . HTN (hypertension)   . Hyperlipidemia    takes zocor  . Inguinal hernia   . Left groin pain     Past Surgical History:  Procedure Laterality Date  . ABDOMINAL AORTOGRAM W/LOWER EXTREMITY N/A 09/03/2018   Procedure: ABDOMINAL AORTOGRAM W/LOWER EXTREMITY;  Surgeon: Wellington Hampshire, MD;  Location: [place] [facility];  Service: Cardiovascular;  Laterality: N/A;  . APPENDECTOMY    . CARDIAC CATHETERIZATION    . CARDIAC DEFIBRILLATOR REMOVAL  2005  . HERNIA REPAIR  09/11/11   repair of LIH   . INGUINAL HERNIA REPAIR  09/11/2011   Procedure: HERNIA REPAIR INGUINAL ADULT;  Surgeon: Judieth Keens, DO;  Location: [place];  Service: General;  Laterality: Left;  open left inguinal hernia with mesh  . LOWER EXTREMITY ANGIOGRAM N/A 02/05/2013  Procedure: LOWER EXTREMITY ANGIOGRAM;  Surgeon: Jettie Booze, MD;  Location: [facility] CATH LAB;  Service: Cardiovascular;  Laterality: N/A;  . PERIPHERAL VASCULAR INTERVENTION Left 09/03/2018   Procedure: PERIPHERAL VASCULAR INTERVENTION;  Surgeon: Wellington Hampshire, MD;  Location: [place] [facility];  Service: Cardiovascular;  Laterality: Left;     Current Outpatient Medications  Medication Sig Dispense Refill  . carvedilol (COREG) 12.5 MG tablet Take one and a half (1 1/2) tablets (18.75 mg) by mouth twice daily. (Patient taking differently: Take 18.75 mg by mouth 2 (two) times daily. Take one and a half (1 1/2) tablets (18.75 mg) by mouth twice daily.)  270 tablet 3  . clopidogrel (PLAVIX) 75 MG tablet Take 1 tablet (75 mg total) by mouth daily with breakfast. 30 tablet 11  . diphenhydramine-acetaminophen (TYLENOL PM) 25-500 MG TABS tablet Take 2 tablets by mouth at bedtime.    [name] FIBER PO Take 1 capsule by mouth 2 (two) times daily.    . mirabegron ER (MYRBETRIQ) 50 MG TB24 tablet Take 50 mg by mouth daily.    . Multiple Vitamins-Minerals (ONE-A-DAY MENS 50+ ADVANTAGE PO) Take 1 tablet by mouth daily.     [name] PARoxetine (PAXIL) 40 MG tablet Take 40 mg by mouth daily.    . rosuvastatin (CRESTOR) 40 MG tablet Take 1 tablet (40 mg total) by mouth daily. 90 tablet 3  . simvastatin (ZOCOR) 40 MG tablet Take 40 mg by mouth every evening.    . venlafaxine XR (EFFEXOR-XR) 75 MG 24 hr capsule Take 75 mg by mouth daily.     No current facility-administered medications for this visit.     Allergies:   Sulfa antibiotics    Social History:  The patient  reports that he has been smoking cigars. He has a 48.00 pack-year smoking history. He has never used smokeless tobacco. He reports that he drinks about 7.0 standard drinks of alcohol per week. He reports that he does not use drugs.   Family History:  The patient's family history includes Cancer in his mother.    ROS:  Please see the history of present illness.   Otherwise, review of systems are positive for none.   All other systems are reviewed and negative.    PHYSICAL EXAM: VS:  BP 128/80   Pulse 63   Ht 5\' 11"  (1.803 m)   Wt 154 lb 9.6 oz (70.1 kg)   SpO2 96%   BMI 21.56 kg/m  , BMI Body mass index is 21.56 kg/m. GEN: Well nourished, well developed, in no acute distress  HEENT: normal  Neck: no JVD, carotid bruits, or masses Cardiac: RRR; no murmurs, rubs, or gallops,no edema  Respiratory:  clear to auscultation bilaterally, normal work of breathing GI: soft, nontender, nondistended, + BS MS: no deformity or atrophy  Skin: warm and dry, no rash Neuro:  Strength and sensation are  intact Psych: euthymic mood, full affect No groin hematoma.  EKG:  EKG is not ordered today.    Recent Labs: 08/26/2018: BUN 13; Creatinine, Ser 0.98; Hemoglobin 15.3; Platelets 242; Potassium 4.9; Sodium 140    Lipid Panel No results found for: CHOL, TRIG, HDL, CHOLHDL, VLDL, LDLCALC, LDLDIRECT    Wt Readings from Last 3 Encounters:  09/30/18 154 lb 9.6 oz (70.1 kg)  09/03/18 154 lb (69.9 kg)  08/26/18 152 lb 9.6 oz (69.2 kg)       No flowsheet data found.    ASSESSMENT AND PLAN:  1.  Peripheral arterial  disease: He underwent recent revascularization of the left SFA for severe claudication with excellent results. The patient has chronic moderate right calf claudication due to an occluded right popliteal artery stent.  Cilostazol is contraindicated due to history of heart failure.   I advised him to start a walking program.  Continue medical therapy and repeat Doppler in 6 months.   2. Tobacco use: I again discussed with him the importance of smoking cessation but he reports inability to quit.  3. Hyperlipidemia: I switched him recently from simvastatin to rosuvastatin 40 mg daily given that his LDL was 107.  He will come for a follow-up lipid and liver profile in the next few weeks.  4. Coronary artery disease: No anginal symptoms.   5. Chronic systolic heart failure: Stable on medications.    Disposition:   FU with me in 6 months  Signed,  Kathlyn Sacramento, MD  09/30/2018 8:11 AM    [place]

## 2018-10-10 ENCOUNTER — Encounter (HOSPITAL_COMMUNITY): Payer: Medicare Other

## 2018-11-05 DIAGNOSIS — D485 Neoplasm of uncertain behavior of skin: Secondary | ICD-10-CM | POA: Diagnosis not present

## 2018-11-05 DIAGNOSIS — L570 Actinic keratosis: Secondary | ICD-10-CM | POA: Diagnosis not present

## 2018-11-05 DIAGNOSIS — C4441 Basal cell carcinoma of skin of scalp and neck: Secondary | ICD-10-CM | POA: Diagnosis not present

## 2018-11-05 DIAGNOSIS — Z85828 Personal history of other malignant neoplasm of skin: Secondary | ICD-10-CM | POA: Diagnosis not present

## 2018-11-05 DIAGNOSIS — D0461 Carcinoma in situ of skin of right upper limb, including shoulder: Secondary | ICD-10-CM | POA: Diagnosis not present

## 2018-11-05 DIAGNOSIS — C44629 Squamous cell carcinoma of skin of left upper limb, including shoulder: Secondary | ICD-10-CM | POA: Diagnosis not present

## 2018-11-05 DIAGNOSIS — D225 Melanocytic nevi of trunk: Secondary | ICD-10-CM | POA: Diagnosis not present

## 2018-12-01 DIAGNOSIS — L988 Other specified disorders of the skin and subcutaneous tissue: Secondary | ICD-10-CM | POA: Diagnosis not present

## 2018-12-01 DIAGNOSIS — D485 Neoplasm of uncertain behavior of skin: Secondary | ICD-10-CM | POA: Diagnosis not present

## 2018-12-01 DIAGNOSIS — Z85828 Personal history of other malignant neoplasm of skin: Secondary | ICD-10-CM | POA: Diagnosis not present

## 2018-12-23 DIAGNOSIS — G250 Essential tremor: Secondary | ICD-10-CM | POA: Diagnosis not present

## 2018-12-23 DIAGNOSIS — I739 Peripheral vascular disease, unspecified: Secondary | ICD-10-CM | POA: Diagnosis not present

## 2018-12-23 DIAGNOSIS — Z79899 Other long term (current) drug therapy: Secondary | ICD-10-CM | POA: Diagnosis not present

## 2018-12-23 DIAGNOSIS — I2510 Atherosclerotic heart disease of native coronary artery without angina pectoris: Secondary | ICD-10-CM | POA: Diagnosis not present

## 2018-12-23 DIAGNOSIS — Z0000 Encounter for general adult medical examination without abnormal findings: Secondary | ICD-10-CM | POA: Diagnosis not present

## 2018-12-23 DIAGNOSIS — R7303 Prediabetes: Secondary | ICD-10-CM | POA: Diagnosis not present

## 2018-12-23 DIAGNOSIS — E785 Hyperlipidemia, unspecified: Secondary | ICD-10-CM | POA: Diagnosis not present

## 2018-12-23 DIAGNOSIS — I5042 Chronic combined systolic (congestive) and diastolic (congestive) heart failure: Secondary | ICD-10-CM | POA: Diagnosis not present

## 2018-12-23 DIAGNOSIS — F322 Major depressive disorder, single episode, severe without psychotic features: Secondary | ICD-10-CM | POA: Diagnosis not present

## 2019-03-11 ENCOUNTER — Ambulatory Visit (HOSPITAL_COMMUNITY)
Admission: RE | Admit: 2019-03-11 | Discharge: 2019-03-11 | Disposition: A | Discharge status: Home / Self Care | Payer: Medicare Other | Source: Ambulatory Visit | Attending: Cardiovascular Disease | Admitting: Cardiovascular Disease

## 2019-03-11 ENCOUNTER — Other Ambulatory Visit: Payer: Self-pay

## 2019-03-11 ENCOUNTER — Other Ambulatory Visit: Payer: Self-pay | Admitting: Cardiovascular Disease

## 2019-03-11 DIAGNOSIS — I739 Peripheral vascular disease, unspecified: Secondary | ICD-10-CM | POA: Insufficient documentation

## 2019-03-16 ENCOUNTER — Telehealth: Payer: Self-pay

## 2019-03-16 DIAGNOSIS — L821 Other seborrheic keratosis: Secondary | ICD-10-CM | POA: Diagnosis not present

## 2019-03-16 DIAGNOSIS — Z85828 Personal history of other malignant neoplasm of skin: Secondary | ICD-10-CM | POA: Diagnosis not present

## 2019-03-16 DIAGNOSIS — D485 Neoplasm of uncertain behavior of skin: Secondary | ICD-10-CM | POA: Diagnosis not present

## 2019-03-16 DIAGNOSIS — C44319 Basal cell carcinoma of skin of other parts of face: Secondary | ICD-10-CM | POA: Diagnosis not present

## 2019-03-16 DIAGNOSIS — L570 Actinic keratosis: Secondary | ICD-10-CM | POA: Diagnosis not present

## 2019-03-16 DIAGNOSIS — D0462 Carcinoma in situ of skin of left upper limb, including shoulder: Secondary | ICD-10-CM | POA: Diagnosis not present

## 2019-03-16 NOTE — Telephone Encounter (Signed)
-----   Message from Wellington Hampshire, MD sent at 03/16/2019 12:12 PM EDT ----- Inform patient that vascular Doppler showed patent area in the distal SFA where balloon angioplasty was done last year.  However, he has new severe blockages on the left side above the hip joint.  Schedule him for an urgent virtual visit with me to discuss this.

## 2019-03-16 NOTE — Telephone Encounter (Signed)
Spoke with the pt and made him aware of his LE study results and Dr. Tyrell Antonio recommendation. He is agreeable with scheduling a virtual visit with Dr. Fletcher Anon. Pt does have acces to a smart device. Phone # for appt [phone]. Verbal consent obtained. Adv the pt to have BP and HR available for the appt. Adv the pt that I do not see any availability on Dr. Tyrell Antonio schedule. Adv the pt that I will send a message to Dr. Fletcher Anon and call him back with a work in appt date and time. Pt verbalized understanding and voiced appreciation for the call.  Adv the pt that I have received a response from Dr. Fletcher Anon. The appt is scheduled for 5/19/@8 :20am.  the pt that he will receive a call 15-20 min prior. Pt verbalized understanding and voiced appreciation for the assistance.

## 2019-03-16 NOTE — Telephone Encounter (Signed)
VERBAL CONSENT OBTAINED  YOUR CARDIOLOGY TEAM HAS ARRANGED FOR AN E-VISIT FOR YOUR APPOINTMENT - PLEASE REVIEW IMPORTANT INFORMATION BELOW SEVERAL DAYS PRIOR TO YOUR APPOINTMENT  Due to the recent COVID-19 pandemic, we are transitioning in-person office visits to tele-medicine visits in an effort to decrease unnecessary exposure to our patients, their families, and staff. These visits are billed to your insurance just like a normal visit is. We also encourage you to sign up for MyChart if you have not already done so. You will need a smartphone if possible. For patients that do not have this, we can still complete the visit using a regular telephone but do prefer a smartphone to enable video when possible. You may have a family member that lives with you that can help. If possible, we also ask that you have a blood pressure cuff and scale at home to measure your blood pressure, heart rate and weight prior to your scheduled appointment. Patients with clinical needs that need an in-person evaluation and testing will still be able to come to the office if absolutely necessary. If you have any questions, feel free to call our office.     YOUR PROVIDER WILL BE USING THE FOLLOWING PLATFORM TO COMPLETE YOUR VISIT:   . IF USING DOXIMITY or [email or web address] - The staff will give you instructions on receiving your link to join the meeting the day of your visit.      2-3 DAYS BEFORE YOUR APPOINTMENT  You will receive a telephone call from one of our [place] team members - your caller ID may say "Unknown caller." If this is a video visit, we will walk you through how to get the video launched on your phone. We will remind you check your blood pressure, heart rate and weight prior to your scheduled appointment. If you have an Apple Watch or Kardia, please upload any pertinent ECG strips the day before or morning of your appointment to [place]. Our staff will also make sure you have reviewed the consent and agree to  move forward with your scheduled tele-health visit.     THE DAY OF YOUR APPOINTMENT  Approximately 15 minutes prior to your scheduled appointment, you will receive a telephone call from one of [place] team - your caller ID may say "Unknown caller."  Our staff will confirm medications, vital signs for the day and any symptoms you may be experiencing. Please have this information available prior to the time of visit start. It may also be helpful for you to have a pad of paper and pen handy for any instructions given during your visit. They will also walk you through joining the smartphone meeting if this is a video visit.    CONSENT FOR TELE-HEALTH VISIT - PLEASE REVIEW  I hereby voluntarily request, consent and authorize [facility] and its employed or contracted physicians, physician assistants, nurse practitioners or other licensed health care professionals (the Practitioner), to provide me with telemedicine health care services (the "Services") as deemed necessary by the treating Practitioner. I acknowledge and consent to receive the Services by the Practitioner via telemedicine. I understand that the telemedicine visit will involve communicating with the Practitioner through live audiovisual communication technology and the disclosure of certain medical information by electronic transmission. I acknowledge that I have been given the opportunity to request an in-person assessment or other available alternative prior to the telemedicine visit and am voluntarily participating in the telemedicine visit.  I understand that I have the right to withhold or withdraw  my consent to the use of telemedicine in the course of my care at any time, without affecting my right to future care or treatment, and that the Practitioner or I may terminate the telemedicine visit at any time. I understand that I have the right to inspect all information obtained and/or recorded in the course of the telemedicine visit  and may receive copies of available information for a reasonable fee.  I understand that some of the potential risks of receiving the Services via telemedicine include:  [name] Delay or interruption in medical evaluation due to technological equipment failure or disruption; . Information transmitted may not be sufficient (e.g. poor resolution of images) to allow for appropriate medical decision making by the Practitioner; and/or  . In rare instances, security protocols could fail, causing a breach of personal health information.  Furthermore, I acknowledge that it is my responsibility to provide information about my medical history, conditions and care that is complete and accurate to the best of my ability. I acknowledge that Practitioner's advice, recommendations, and/or decision may be based on factors not within their control, such as incomplete or inaccurate data provided by me or distortions of diagnostic images or specimens that may result from electronic transmissions. I understand that the practice of medicine is not an exact science and that Practitioner makes no warranties or guarantees regarding treatment outcomes. I acknowledge that I will receive a copy of this consent concurrently upon execution via email to the email address I last provided but may also request a printed copy by calling the office of [place].    I understand that my insurance will be billed for this visit.   I have read or had this consent read to me. . I understand the contents of this consent, which adequately explains the benefits and risks of the Services being provided via telemedicine.  . I have been provided ample opportunity to ask questions regarding this consent and the Services and have had my questions answered to my satisfaction. . I give my informed consent for the services to be provided through the use of telemedicine in my medical care  By participating in this telemedicine visit I agree to the  above.

## 2019-03-16 NOTE — Telephone Encounter (Signed)
Called to give pt results of LE study and Dr. Tyrell Antonio recommendation lmtcb.

## 2019-03-17 ENCOUNTER — Encounter: Payer: Self-pay | Admitting: Cardiovascular Disease

## 2019-03-17 ENCOUNTER — Telehealth (INDEPENDENT_AMBULATORY_CARE_PROVIDER_SITE_OTHER): Payer: Medicare Other | Admitting: Cardiovascular Disease

## 2019-03-17 VITALS — BP 173/93 | HR 68 | Ht 70.0 in | Wt 147.0 lb

## 2019-03-17 DIAGNOSIS — I739 Peripheral vascular disease, unspecified: Secondary | ICD-10-CM | POA: Diagnosis not present

## 2019-03-17 NOTE — Progress Notes (Signed)
Virtual Visit via Video Note   This visit type was conducted due to national recommendations for restrictions regarding the COVID-19 Pandemic (e.g. social distancing) in an effort to limit this patient's exposure and mitigate transmission in our community.  Due to his co-morbid illnesses, this patient is at least at moderate risk for complications without adequate follow up.  This format is felt to be most appropriate for this patient at this time.  All issues noted in this document were discussed and addressed.  A limited physical exam was performed with this format.  Please refer to the patient's chart for his consent to telehealth for [facility].   Date:  03/17/2019   ID:  Nicholas Herrera, DOB 1941-02-25, MRN [id number]  Patient Location: Home Provider Location: Home  PCP:  Jonathon Jordan, MD  Cardiologist: Dr. Tamala Julian Electrophysiologist:  None   Evaluation Performed:  Follow-Up Visit  Chief Complaint: Fatigue in both legs  History of Present Illness:    Nicholas Herrera is a 78 y.o. male with history of peripheral arterial disease.  I initially attempted video visit but the patient could not connect and thus switched to a phone. He has known history of coronary artery disease, chronic systolic/diastolic heart failure, tobacco use and hypertension. He has known history of peripheral arterial disease with previous revascularization of the right proximal popliteal artery using a 6 x 100 mm self-expanding stent in 2014. Subsequent studies showed occluded right popliteal artery stent with 2 vessel runoff below the knee and mildly reduced ABI. I saw him last year for severe left calf claudication. Angiography showed focal occlusion in the distal left SFA which was heavily calcified.  It was treated successfully with orbital atherectomy and drug-coated balloon angioplasty.    He had resolution of claudication and ABI went back to normal.  He had follow-up Doppler studies last week which  showed patent left SFA.  However, there was new severe stenosis in the distal left common iliac artery extending into the external iliac artery with velocities above 600.  In spite of that, the patient reports no new symptoms.  He describes bilateral leg fatigue which is equal in both sides.   No chest pain or worsening dyspnea.  He continues to smoke. The patient does not have symptoms concerning for COVID-19 infection (fever, chills, cough, or new shortness of breath).    Past Medical History:  Diagnosis Date  . Abdominal distention   . Abdominal pain   . Bronchitis    hx of  . Cancer ([place])    skin  . CHF (congestive heart failure) (HCC)     (hx of EF 27%) s/p AICD 2004. Most recent LVEF > 40% , 8/11  . Chronic kidney disease    has small stones, no treatment  . Cough   . Depression    takes paxil  . Diverticulosis    hx of  . Dysrhythmia   . Easy bruising   . Hematuria    hx of  . Hernia october 2012   [facility]  . HTN (hypertension)   . Hyperlipidemia    takes zocor  . Inguinal hernia   . Left groin pain    Past Surgical History:  Procedure Laterality Date  . ABDOMINAL AORTOGRAM W/LOWER EXTREMITY N/A 09/03/2018   Procedure: ABDOMINAL AORTOGRAM W/LOWER EXTREMITY;  Surgeon: Wellington Hampshire, MD;  Location: [place] [facility];  Service: Cardiovascular;  Laterality: N/A;  . APPENDECTOMY    . CARDIAC CATHETERIZATION    .  CARDIAC DEFIBRILLATOR REMOVAL  2005  . HERNIA REPAIR  09/11/11   repair of LIH   . INGUINAL HERNIA REPAIR  09/11/2011   Procedure: HERNIA REPAIR INGUINAL ADULT;  Surgeon: Judieth Keens, DO;  Location: [place];  Service: General;  Laterality: Left;  open left inguinal hernia with mesh  . LOWER EXTREMITY ANGIOGRAM N/A 02/05/2013   Procedure: LOWER EXTREMITY ANGIOGRAM;  Surgeon: Jettie Booze, MD;  Location: [facility] CATH LAB;  Service: Cardiovascular;  Laterality: N/A;  . PERIPHERAL VASCULAR INTERVENTION Left 09/03/2018   Procedure: PERIPHERAL VASCULAR  INTERVENTION;  Surgeon: Wellington Hampshire, MD;  Location: [place] [facility];  Service: Cardiovascular;  Laterality: Left;     Current Meds  Medication Sig  . carvedilol (COREG) 12.5 MG tablet Take one and a half (1 1/2) tablets (18.75 mg) by mouth twice daily. (Patient taking differently: Take 18.75 mg by mouth 2 (two) times daily. Take one and a half (1 1/2) tablets (18.75 mg) by mouth twice daily.)  . clopidogrel (PLAVIX) 75 MG tablet Take 1 tablet (75 mg total) by mouth daily with breakfast.  . diphenhydramine-acetaminophen (TYLENOL PM) 25-500 MG TABS tablet Take 2 tablets by mouth at bedtime.  [name] FIBER PO Take 1 capsule by mouth 2 (two) times daily.  . mirabegron ER (MYRBETRIQ) 50 MG TB24 tablet Take 50 mg by mouth daily.  . Multiple Vitamins-Minerals (ONE-A-DAY MENS 50+ ADVANTAGE PO) Take 1 tablet by mouth daily.   [name] PARoxetine (PAXIL) 40 MG tablet Take 40 mg by mouth daily.  . rosuvastatin (CRESTOR) 40 MG tablet Take 1 tablet (40 mg total) by mouth daily.  [name] venlafaxine XR (EFFEXOR-XR) 75 MG 24 hr capsule Take 75 mg by mouth daily.     Allergies:   Sulfa antibiotics   Social History   Tobacco Use  . Smoking status: Current Every Day Smoker    Packs/day: 1.00    Years: 48.00    Pack years: 48.00    Types: Cigars  . Smokeless tobacco: Never Used  Substance Use Topics  . Alcohol use: Yes    Alcohol/week: 7.0 standard drinks    Types: 7 drink(s) per week  . Drug use: No     Family Hx: The patient's family history includes Cancer in his mother.  ROS:   Please see the history of present illness.     All other systems reviewed and are negative.   Prior CV studies:   The following studies were reviewed today:  I reviewed recent vascular studies with him.  Labs/Other Tests and Data Reviewed:    EKG:  No ECG reviewed.  Recent Labs: 08/26/2018: BUN 13; Creatinine, Ser 0.98; Hemoglobin 15.3; Platelets 242; Potassium 4.9; Sodium 140   Recent Lipid Panel No results  found for: CHOL, TRIG, HDL, CHOLHDL, LDLCALC, LDLDIRECT  Wt Readings from Last 3 Encounters:  03/17/19 147 lb (66.7 kg)  09/30/18 154 lb 9.6 oz (70.1 kg)  09/03/18 154 lb (69.9 kg)     Objective:    Vital Signs:  BP (!) 173/93   Pulse 68   Ht 5\' 10"  (1.778 m)   Wt 147 lb (66.7 kg)   BMI 21.09 kg/m    VITAL SIGNS:  reviewed  ASSESSMENT & PLAN:    1.  Peripheral arterial disease: Status post revascularization of the left distal SFA.  Vascular studies now shows a drop in ABI on the left side with evidence of severe stenosis affecting the distal left common iliac artery into the external  iliac artery.  In spite of that, the patient does not seem to have new symptoms. Continue medical therapy for now.  I will have him follow-up with me in the office in 1 month for reevaluation.   2. Tobacco use: I again discussed with him the importance of smoking cessation but he reports inability to quit.  3. Hyperlipidemia: The patient was switched to high-dose rosuvastatin with improvement in LDL to 93.  4. Coronary artery disease: No anginal symptoms.   5. Chronic systolic heart failure: Stable on medications.  6.  Essential hypertension: Blood pressure is elevated but that somewhat unusual for him and he has not taken carvedilol yet.  Continue to monitor for now.   COVID-19 Education: The signs and symptoms of COVID-19 were discussed with the patient and how to seek care for testing (follow up with PCP or arrange E-visit).  The importance of social distancing was discussed today.  Time:   Today, I have spent 12  minutes with the patient with telehealth technology discussing the above problems.     Medication Adjustments/Labs and Tests Ordered: Current medicines are reviewed at length with the patient today.  Concerns regarding medicines are outlined above.   Tests Ordered: No orders of the defined types were placed in this encounter.   Medication Changes: No orders of the  defined types were placed in this encounter.   Disposition:  Follow up in 1 month(s)  Signed, Kathlyn Sacramento, MD  03/17/2019 8:25 AM    [place]

## 2019-03-17 NOTE — Patient Instructions (Signed)
Medication Instructions:  Continue same medications If you need a refill on your cardiac medications before your next appointment, please call your pharmacy.   Lab work: None If you have labs (blood work) drawn today and your tests are completely normal, you will receive your results only by: [name] MyChart Message (if you have MyChart) OR . A paper copy in the mail If you have any lab test that is abnormal or we need to change your treatment, we will call you to review the results.  Testing/Procedures: None  Follow-Up: Follow-up with Dr. Fletcher Anon in 1 month

## 2019-03-19 DIAGNOSIS — R350 Frequency of micturition: Secondary | ICD-10-CM | POA: Diagnosis not present

## 2019-03-19 DIAGNOSIS — R310 Gross hematuria: Secondary | ICD-10-CM | POA: Diagnosis not present

## 2019-04-23 ENCOUNTER — Telehealth: Payer: Self-pay | Admitting: Cardiovascular Disease

## 2019-04-23 NOTE — Telephone Encounter (Signed)
smartphone/ consent/ my chart/ pre reg completed °

## 2019-04-28 ENCOUNTER — Telehealth (INDEPENDENT_AMBULATORY_CARE_PROVIDER_SITE_OTHER): Payer: Medicare Other | Admitting: Cardiovascular Disease

## 2019-04-28 ENCOUNTER — Encounter: Payer: Self-pay | Admitting: Cardiovascular Disease

## 2019-04-28 VITALS — BP 146/77 | HR 71 | Wt 155.0 lb

## 2019-04-28 DIAGNOSIS — I739 Peripheral vascular disease, unspecified: Secondary | ICD-10-CM | POA: Diagnosis not present

## 2019-04-28 DIAGNOSIS — I5022 Chronic systolic (congestive) heart failure: Secondary | ICD-10-CM

## 2019-04-28 DIAGNOSIS — I10 Essential (primary) hypertension: Secondary | ICD-10-CM

## 2019-04-28 DIAGNOSIS — Z720 Tobacco use: Secondary | ICD-10-CM | POA: Diagnosis not present

## 2019-04-28 DIAGNOSIS — E782 Mixed hyperlipidemia: Secondary | ICD-10-CM | POA: Diagnosis not present

## 2019-04-28 DIAGNOSIS — I110 Hypertensive heart disease with heart failure: Secondary | ICD-10-CM | POA: Diagnosis not present

## 2019-04-28 NOTE — Progress Notes (Signed)
Virtual Visit via Video Note   This visit type was conducted due to national recommendations for restrictions regarding the COVID-19 Pandemic (e.g. social distancing) in an effort to limit this patient's exposure and mitigate transmission in our community.  Due to his co-morbid illnesses, this patient is at least at moderate risk for complications without adequate follow up.  This format is felt to be most appropriate for this patient at this time.  All issues noted in this document were discussed and addressed.  A limited physical exam was performed with this format.  Please refer to the patient's chart for his consent to telehealth for [facility].   Date:  04/28/2019   ID:  Nicholas Herrera, DOB May 17, 1941, MRN [id number]  Patient Location: Home Provider Location: Home  PCP:  Jonathon Jordan, MD  Cardiologist: Dr. Tamala Julian Electrophysiologist:  None   Evaluation Performed:  Follow-Up Visit  Chief Complaint: Fatigue in both legs  History of Present Illness:    Nicholas Herrera is a 78 y.o. male with history of peripheral arterial disease.  I initially attempted video visit but the patient could not connect and thus switched to a phone. He has known history of coronary artery disease, chronic systolic/diastolic heart failure, tobacco use and hypertension. He has known history of peripheral arterial disease with previous revascularization of the right proximal popliteal artery using a 6 x 100 mm self-expanding stent in 2014. Subsequent studies showed occluded right popliteal artery stent with 2 vessel runoff below the knee and mildly reduced ABI. I saw him last year for severe left calf claudication. Angiography showed focal occlusion in the distal left SFA which was heavily calcified.  It was treated successfully with orbital atherectomy and drug-coated balloon angioplasty.    He had resolution of claudication and ABI went back to normal.  He had follow-up Doppler studies recently which  showed patent left SFA.  However, there was new severe stenosis in the distal left common iliac artery extending into the external iliac artery with velocities above 600.   The patient did not feel that his claudication was lifestyle limiting.  He reports stable symptoms since last evaluation.  He is able to do yard work although he does admit to some weakness in his left leg.    He continues to smoke. He reports were some worsening of exertional dyspnea without chest pain.  He also reports that his blood pressure has been elevated.  The patient does not have symptoms concerning for COVID-19 infection (fever, chills, cough, or new shortness of breath).    Past Medical History:  Diagnosis Date  . Abdominal distention   . Abdominal pain   . Bronchitis    hx of  . Cancer ([place])    skin  . CHF (congestive heart failure) (HCC)     (hx of EF 27%) s/p AICD 2004. Most recent LVEF > 40% , 8/11  . Chronic kidney disease    has small stones, no treatment  . Cough   . Depression    takes paxil  . Diverticulosis    hx of  . Dysrhythmia   . Easy bruising   . Hematuria    hx of  . Hernia october 2012   [facility]  . HTN (hypertension)   . Hyperlipidemia    takes zocor  . Inguinal hernia   . Left groin pain    Past Surgical History:  Procedure Laterality Date  . ABDOMINAL AORTOGRAM W/LOWER EXTREMITY N/A 09/03/2018   Procedure: ABDOMINAL  AORTOGRAM W/LOWER EXTREMITY;  Surgeon: Wellington Hampshire, MD;  Location: [place] [facility];  Service: Cardiovascular;  Laterality: N/A;  . APPENDECTOMY    . CARDIAC CATHETERIZATION    . CARDIAC DEFIBRILLATOR REMOVAL  2005  . HERNIA REPAIR  09/11/11   repair of LIH   . INGUINAL HERNIA REPAIR  09/11/2011   Procedure: HERNIA REPAIR INGUINAL ADULT;  Surgeon: Judieth Keens, DO;  Location: [place];  Service: General;  Laterality: Left;  open left inguinal hernia with mesh  . LOWER EXTREMITY ANGIOGRAM N/A 02/05/2013   Procedure: LOWER EXTREMITY ANGIOGRAM;   Surgeon: Jettie Booze, MD;  [facility]: [facility];  Service: Cardiovascular;  Laterality: N/A;  . PERIPHERAL VASCULAR INTERVENTION Left 09/03/2018   Procedure: PERIPHERAL VASCULAR INTERVENTION;  Surgeon: Wellington Hampshire, MD;  Location: [place] [facility];  Service: Cardiovascular;  Laterality: Left;     Current Meds  Medication Sig  . carvedilol (COREG) 12.5 MG tablet Take one and a half (1 1/2) tablets (18.75 mg) by mouth twice daily. (Patient taking differently: Take 18.75 mg by mouth 2 (two) times daily. Take one and a half (1 1/2) tablets (18.75 mg) by mouth twice daily.)  . clopidogrel (PLAVIX) 75 MG tablet Take 1 tablet (75 mg total) by mouth daily with breakfast.  . diphenhydramine-acetaminophen (TYLENOL PM) 25-500 MG TABS tablet Take 2 tablets by mouth at bedtime.  [name] FIBER PO Take 1 capsule by mouth 2 (two) times daily.  . mirabegron ER (MYRBETRIQ) 50 MG TB24 tablet Take 50 mg by mouth daily.  . Multiple Vitamins-Minerals (ONE-A-DAY MENS 50+ ADVANTAGE PO) Take 1 tablet by mouth daily.   [name] PARoxetine (PAXIL) 40 MG tablet Take 40 mg by mouth daily.  . rosuvastatin (CRESTOR) 40 MG tablet Take 1 tablet (40 mg total) by mouth daily.  [name] venlafaxine XR (EFFEXOR-XR) 75 MG 24 hr capsule Take 75 mg by mouth daily.     Allergies:   Sulfa antibiotics   Social History   Tobacco Use  . Smoking status: Current Every Day Smoker    Packs/day: 1.00    Years: 48.00    Pack years: 48.00    Types: Cigars  . Smokeless tobacco: Never Used  Substance Use Topics  . Alcohol use: Yes    Alcohol/week: 7.0 standard drinks    Types: 7 drink(s) per week  . Drug use: No     Family Hx: The patient's family history includes Cancer in his mother.  ROS:   Please see the history of present illness.     All other systems reviewed and are negative.   Prior CV studies:   The following studies were reviewed today:  I reviewed recent vascular studies with him.  Labs/Other Tests and Data  Reviewed:    EKG:  No ECG reviewed.  Recent Labs: 08/26/2018: BUN 13; Creatinine, Ser 0.98; Hemoglobin 15.3; Platelets 242; Potassium 4.9; Sodium 140   Recent Lipid Panel No results found for: CHOL, TRIG, HDL, CHOLHDL, LDLCALC, LDLDIRECT  Wt Readings from Last 3 Encounters:  04/28/19 155 lb (70.3 kg)  03/17/19 147 lb (66.7 kg)  09/30/18 154 lb 9.6 oz (70.1 kg)     Objective:    Vital Signs:  BP (!) 146/77   Pulse 71   Wt 155 lb (70.3 kg)   BMI 22.24 kg/m    VITAL SIGNS:  reviewed  ASSESSMENT & PLAN:    1.  Peripheral arterial disease: Status post revascularization of the left distal SFA.  Vascular studies in May showed a drop in ABI on the left side with evidence of severe stenosis affecting the distal left common iliac artery into the external iliac artery.  He does report some worsening of left leg claudication but does not feel very limited by this and wants to hold off any further intervention.  I will have him follow-up in 3 months with me.   2. Tobacco use: I again discussed with him the importance of smoking cessation but he reports inability to quit.  3. Hyperlipidemia: The patient was switched to high-dose rosuvastatin with improvement in LDL to 93.  4. Coronary artery disease: No anginal symptoms.   5. Chronic systolic heart failure: He reports some worsening of exertional dyspnea and his blood pressure has been elevated.  No recent EF evaluation.  I requested an echocardiogram.  It is not entirely clear why he is not on an ACE inhibitor or ARB.  If ejection fraction is less than 40%, should also consider Entresto.  I will have him follow-up with Dr. Tamala Julian after the echo is done.  6.  Essential hypertension: Blood pressure is elevated.  If EF is reduced, needs an ACE inhibitor, ARB or Entresto.  Otherwise, carvedilol can be increased.   COVID-19 Education: The signs and symptoms of COVID-19 were discussed with the patient and how to seek care for testing  (follow up with PCP or arrange E-visit).  The importance of social distancing was discussed today.  Time:   Today, I have spent 10  minutes with the patient with telehealth technology discussing the above problems.     Medication Adjustments/Labs and Tests Ordered: Current medicines are reviewed at length with the patient today.  Concerns regarding medicines are outlined above.   Tests Ordered: No orders of the defined types were placed in this encounter.   Medication Changes: No orders of the defined types were placed in this encounter.   Disposition:  Follow up with Dr. Tamala Julian after echocardiogram is done and with me in 3 months.  Signed, Kathlyn Sacramento, MD  04/28/2019 9:33 AM    [place]

## 2019-04-28 NOTE — Patient Instructions (Signed)
Medication Instructions:  Your physician recommends that you continue on your current medications as directed. Please refer to the Current Medication list given to you today.  If you need a refill on your cardiac medications before your next appointment, please call your pharmacy.   Testing/Procedures: Your physician has requested that you have an echocardiogram. Echocardiography is a painless test that uses sound waves to create images of your heart. It provides your doctor with information about the size and shape of your heart and how well your heart's chambers and valves are working. This procedure takes approximately one hour. There are no restrictions for this procedure.  Follow-Up: At [facility], you and your health needs are our priority.  As part of our continuing mission to provide you with exceptional heart care, we have created designated Provider Care Teams.  These Care Teams include your primary Cardiologist (physician) and Advanced Practice Providers (APPs -  Physician Assistants and Nurse Practitioners) who all work together to provide you with the care you need, when you need it.  Please schedule a follow-up appointment with Dr. Tamala Julian in 1 month.  You will need a follow up appointment in 3 months.  Please call our office 2 months in advance to schedule this appointment.  You may see  or one of the following Advanced Practice Providers on your designated Care Team:   Kerin Ransom, PA-C Roby Lofts, [place] . Sande Rives, PA-C  Any Other Special Instructions Will Be Listed Below (If Applicable). None

## 2019-04-29 ENCOUNTER — Encounter (HOSPITAL_COMMUNITY): Payer: Self-pay | Admitting: Cardiovascular Disease

## 2019-04-29 ENCOUNTER — Telehealth: Payer: Self-pay | Admitting: Interventional Cardiology

## 2019-04-29 NOTE — Telephone Encounter (Signed)
Patient called back.  He accepted and is scheduled for 7/27 at 920am with Dr. Tamala Julian.

## 2019-04-29 NOTE — Telephone Encounter (Signed)
Pt seen by Dr. Fletcher Anon yesterday and advised to f/u with Dr. Tamala Julian in one month.  Left message for pt to call back.  Was going to offer appt on 7/27 at 920A or 340P.

## 2019-04-30 ENCOUNTER — Other Ambulatory Visit: Payer: Self-pay

## 2019-04-30 ENCOUNTER — Ambulatory Visit (HOSPITAL_COMMUNITY): Payer: Medicare Other | Attending: Cardiology

## 2019-04-30 DIAGNOSIS — I5022 Chronic systolic (congestive) heart failure: Secondary | ICD-10-CM | POA: Diagnosis not present

## 2019-05-04 ENCOUNTER — Telehealth: Payer: Self-pay | Admitting: *Deleted

## 2019-05-04 DIAGNOSIS — I5022 Chronic systolic (congestive) heart failure: Secondary | ICD-10-CM

## 2019-05-04 MED ORDER — ENTRESTO 24-26 MG PO TABS: 1.0000 | ORAL_TABLET | Freq: Two times a day (BID) | ORAL | 3 refills | Status: DC

## 2019-05-04 NOTE — Telephone Encounter (Signed)
-----   Message from Nicholas Hampshire, MD sent at 05/01/2019 12:04 PM EDT ----- Inform patient that echo showed severely reduced LV systolic function.  Recommend adding Entresto 24/26 mg twice daily.  Check basic metabolic profile in 1 week.  He should keep his follow-up appointment with Dr. Tamala Julian to discuss further management.

## 2019-05-04 NOTE — Telephone Encounter (Signed)
Patient made aware of results and verbalized understanding. Entresto 24/26 mg bid has been sent to [facility] per the patient request.   He will have the BMET completed one week after starting the [facility].

## 2019-05-07 ENCOUNTER — Telehealth: Payer: Self-pay | Admitting: Cardiovascular Disease

## 2019-05-07 NOTE — Telephone Encounter (Signed)
Called patient, he would like to know if there is another option for the [facility]- he states for a 3 month supply the price would put him in the donut hole and cause his other medications to go up. He was given the option for copay card, or patient assistance- but wants to see other option first, and if nothing generic he will get the medication anyway.   Will route to MD and nurse. Thanks!

## 2019-05-07 NOTE — Telephone Encounter (Signed)
New message:    Patient calling stating that his medication is to expensive Please call patient concering this matter.

## 2019-05-08 NOTE — Telephone Encounter (Signed)
There is no equivalent to Entresto.  ACE inhibitors or ARB is can be used as an alternative but the studies have shown that Delene Loll is superior.

## 2019-05-11 NOTE — Telephone Encounter (Signed)
The patient verbalized his understanding and will stay on the [facility].

## 2019-05-15 ENCOUNTER — Other Ambulatory Visit: Payer: Self-pay

## 2019-05-15 DIAGNOSIS — I5022 Chronic systolic (congestive) heart failure: Secondary | ICD-10-CM | POA: Diagnosis not present

## 2019-05-16 LAB — BASIC METABOLIC PANEL
BUN/Creatinine Ratio: 15 (ref 10–24)
BUN: 15 mg/dL (ref 8–27)
CO2: 25 mmol/L (ref 20–29)
Calcium: 9.1 mg/dL (ref 8.6–10.2)
Chloride: 102 mmol/L (ref 96–106)
Creatinine, Ser: 1 mg/dL (ref 0.76–1.27)
GFR calc Af Amer: 84 mL/min/{1.73_m2} (ref >59)
GFR calc non Af Amer: 73 mL/min/{1.73_m2} (ref >59)
Glucose: 128 mg/dL — ABNORMAL HIGH (ref 65–99)
Potassium: 4.2 mmol/L (ref 3.5–5.2)
Sodium: 142 mmol/L (ref 134–144)

## 2019-05-22 ENCOUNTER — Telehealth: Payer: Self-pay

## 2019-05-22 NOTE — Telephone Encounter (Signed)

## 2019-05-23 NOTE — Progress Notes (Signed)
Cardiology Office Note:    Date:  05/25/2019   ID:  Nicholas Herrera, DOB 1941-01-01, MRN [id number]  PCP:  Jonathon Jordan, MD  Cardiologist:  No primary care provider on file.   Referring MD: Jonathon Jordan, MD   Chief Complaint  Patient presents with  . Congestive Heart Failure  . Claudication  . Coronary Artery Disease    History of Present Illness:    Nicholas Herrera is a 78 y.o. male with a hx of hypertension, coronary disease (? non-obstructive), chronic combined systolic and chronic combined systolic and diastolic heart failure EF <30%--> 40%-->, initial AICD 2004 now deactivated, CKD stage III, essential hypertension, complex PAD followed by Dr. Kathlyn Sacramento, and hyperlipidemia.  The left thigh fatigues when he walks.  It improves with activity.  Overall, he feels okay.  He denies dyspnea and orthopnea.  He has not had syncope or angina.  He believes the thigh fatigue has relationship to his known PAD.  She is compliant with the current medical regimen.   Past Medical History:  Diagnosis Date  . Abdominal distention   . Abdominal pain   . Bronchitis    hx of  . Cancer ([place])    skin  . CHF (congestive heart failure) (HCC)     (hx of EF 27%) s/p AICD 2004. Most recent LVEF > 40% , 8/11  . Chronic kidney disease    has small stones, no treatment  . Cough   . Depression    takes paxil  . Diverticulosis    hx of  . Dysrhythmia   . Easy bruising   . Hematuria    hx of  . Hernia october 2012   [facility]  . HTN (hypertension)   . Hyperlipidemia    takes zocor  . Inguinal hernia   . Left groin pain     Past Surgical History:  Procedure Laterality Date  . ABDOMINAL AORTOGRAM W/LOWER EXTREMITY N/A 09/03/2018   Procedure: ABDOMINAL AORTOGRAM W/LOWER EXTREMITY;  Surgeon: Wellington Hampshire, MD;  Location: [place] [facility];  Service: Cardiovascular;  Laterality: N/A;  . APPENDECTOMY    . CARDIAC CATHETERIZATION    . CARDIAC DEFIBRILLATOR REMOVAL  2005  . HERNIA  REPAIR  09/11/11   repair of LIH   . INGUINAL HERNIA REPAIR  09/11/2011   Procedure: HERNIA REPAIR INGUINAL ADULT;  Surgeon: Judieth Keens, DO;  Location: [place];  Service: General;  Laterality: Left;  open left inguinal hernia with mesh  . LOWER EXTREMITY ANGIOGRAM N/A 02/05/2013   Procedure: LOWER EXTREMITY ANGIOGRAM;  Surgeon: Jettie Booze, MD;  Location: [facility] CATH LAB;  Service: Cardiovascular;  Laterality: N/A;  . PERIPHERAL VASCULAR INTERVENTION Left 09/03/2018   Procedure: PERIPHERAL VASCULAR INTERVENTION;  Surgeon: Wellington Hampshire, MD;  Location: [place] [facility];  Service: Cardiovascular;  Laterality: Left;    Current Medications: Current Meds  Medication Sig  . carvedilol (COREG) 12.5 MG tablet Take 1 tablet (12.5 mg total) by mouth 2 (two) times daily with a meal. Take one and a half (1 1/2) tablets (18.75 mg) by mouth twice daily.  . clopidogrel (PLAVIX) 75 MG tablet Take 1 tablet (75 mg total) by mouth daily with breakfast.  . diphenhydramine-acetaminophen (TYLENOL PM) 25-500 MG TABS tablet Take 2 tablets by mouth at bedtime.  [name] FIBER PO Take 1 capsule by mouth 2 (two) times daily.  . mirabegron ER (MYRBETRIQ) 50 MG TB24 tablet Take 50 mg by mouth daily.  . Multiple  Vitamins-Minerals (ONE-A-DAY MENS 50+ ADVANTAGE PO) Take 1 tablet by mouth daily.   [name] PARoxetine (PAXIL) 40 MG tablet Take 40 mg by mouth daily.  . rosuvastatin (CRESTOR) 40 MG tablet Take 1 tablet (40 mg total) by mouth daily.  . sacubitril-valsartan (ENTRESTO) 24-26 MG Take 1 tablet by mouth 2 (two) times daily.  [name] venlafaxine XR (EFFEXOR-XR) 75 MG 24 hr capsule Take 75 mg by mouth daily.  . [DISCONTINUED] carvedilol (COREG) 12.5 MG tablet Take one and a half (1 1/2) tablets (18.75 mg) by mouth twice daily.     Allergies:   Sulfa antibiotics   Social History   Socioeconomic History  . Marital status: Married    Spouse name: Not on file  . Number of children: Not on file  . Years of education:  Not on file  . Highest education level: Not on file  Occupational History  . Not on file  Social Needs  . Financial resource strain: Not on file  . Food insecurity    Worry: Not on file    Inability: Not on file  . Transportation needs    Medical: Not on file    Non-medical: Not on file  Tobacco Use  . Smoking status: Current Every Day Smoker    Packs/day: 1.00    Years: 48.00    Pack years: 48.00    Types: Cigars  . Smokeless tobacco: Never Used  Substance and Sexual Activity  . Alcohol use: Yes    Alcohol/week: 7.0 standard drinks    Types: 7 drink(s) per week  . Drug use: No  . Sexual activity: Not on file  Lifestyle  . Physical activity    Days per week: Not on file    Minutes per session: Not on file  . Stress: Not on file  Relationships  . Social Herbalist on phone: Not on file    Gets together: Not on file    Attends religious service: Not on file    Active member of club or organization: Not on file    Attends meetings of clubs or organizations: Not on file    Relationship status: Not on file  Other Topics Concern  . Not on file  Social History Narrative  . Not on file     Family History: The patient's family history includes Cancer in his mother.  ROS:   Please see the history of present illness.    He denies orthostatic dizziness.  He is having no pain with ambulation.  Appetite is stable.  All other systems reviewed and are negative.  EKGs/Labs/Other Studies Reviewed:    The following studies were reviewed today: 2D Doppler echocardiogram 2020:  IMPRESSIONS    1. The left ventricle has severely reduced systolic function, with an ejection fraction of 20-25%. The cavity size was moderately dilated. Left ventricular diastolic Doppler parameters are consistent with impaired relaxation. Left ventricular diffuse  hypokinesis.  2. The right ventricle has normal systolic function. The cavity was normal.  3. The aortic valve is tricuspid.  Mild calcification of the aortic valve. No stenosis of the aortic valve.  4. Severe global reduction in LV systolic function; moderate LVE; mild diastolic dysfunction.  EKG:  EKG is not repeated on today's visit.  Last tracing was October 2019 and demonstrated voltage criteria for LVH.  Recent Labs: 08/26/2018: Hemoglobin 15.3; Platelets 242 05/15/2019: BUN 15; Creatinine, Ser 1.00; Potassium 4.2; Sodium 142  Recent Lipid Panel No results found for: CHOL, TRIG,  HDL, CHOLHDL, VLDL, LDLCALC, LDLDIRECT  Physical Exam:    VS:  BP (!) 92/48   Pulse 69   Ht 5\' 10"  (1.778 m)   Wt 146 lb 9.6 oz (66.5 kg)   SpO2 96%   BMI 21.03 kg/m     Wt Readings from Last 3 Encounters:  05/25/19 146 lb 9.6 oz (66.5 kg)  04/28/19 155 lb (70.3 kg)  03/17/19 147 lb (66.7 kg)     GEN: Slender and almost malnourished appearing. No acute distress HEENT: Normal NECK: No JVD. LYMPHATICS: No lymphadenopathy CARDIAC:  RRR without murmur, gallop, or edema. VASCULAR:  Normal Pulses. No bruits. RESPIRATORY:  Clear to auscultation without rales, wheezing or rhonchi  ABDOMEN: Soft, non-tender, non-distended, No pulsatile mass, MUSCULOSKELETAL: No deformity  SKIN: Warm and dry NEUROLOGIC:  Alert and oriented x 3 PSYCHIATRIC:  Normal affect   ASSESSMENT:    1. Chronic combined systolic and diastolic heart failure ([place])   2. Hypotension, unspecified hypotension type   3. Coronary artery disease involving native coronary artery of native heart without angina pectoris   4. PAD (peripheral artery disease) ([place])   5. Tobacco use   6. Essential hypertension   7. Mixed hyperlipidemia   8. Educated About Covid-19 Virus Infection    PLAN:    In order of problems listed above:  1. New recurrent decreased LV systolic function identified on echocardiogram done this summer.  Entresto added.  Patient already on carvedilol.  Blood pressure check today demonstrates significant orthostasis: Resting blood pressure  122/78--> 98/60 mmHg standing without significant increase in heart rate. 2. Decrease carvedilol to 12.5 mg twice daily.  Continue low-dose Entresto.  Kidney function is stable on Entresto. 3. Asymptomatic nonobstructive CAD. 4. Left thigh weakness with exertion possibly PAD related.  He will rediscuss this with Dr. Fletcher Anon in September. 5. Advocated against smoking. 6. Relatively low blood pressures as outlined above.  Target 130/80 mmHg or less. 7. LDL target less than 70. 8. Discussed W x3 to prevent infection.  60-month follow-up.  Increase fluid intake.  Call if dizziness or lightheadedness.   Medication Adjustments/Labs and Tests Ordered: Current medicines are reviewed at length with the patient today.  Concerns regarding medicines are outlined above.  No orders of the defined types were placed in this encounter.  Meds ordered this encounter  Medications  . carvedilol (COREG) 12.5 MG tablet    Sig: Take 1 tablet (12.5 mg total) by mouth 2 (two) times daily with a meal. Take one and a half (1 1/2) tablets (18.75 mg) by mouth twice daily.    Dispense:  180 tablet    Refill:  3    Dose change    Patient Instructions  Medication Instructions:  1) DECREASE Carvedilol to 12.5mg  twice daily  If you need a refill on your cardiac medications before your next appointment, please call your pharmacy.   Lab work: None If you have labs (blood work) drawn today and your tests are completely normal, you will receive your results only by: [name] MyChart Message (if you have MyChart) OR . A paper copy in the mail If you have any lab test that is abnormal or we need to change your treatment, we will call you to review the results.  Testing/Procedures: None  Follow-Up: At [facility], you and your health needs are our priority.  As part of our continuing mission to provide you with exceptional heart care, we have created designated Provider Care Teams.  These Care Teams  include your primary  Cardiologist (physician) and Advanced Practice Providers (APPs -  Physician Assistants and Nurse Practitioners) who all work together to provide you with the care you need, when you need it. You will need a follow up appointment in 3 months.  Please call our office 2 months in advance to schedule this appointment.  You may see Dr. Tamala Julian or one of the following Advanced Practice Providers on your designated Care Team:   Truitt Merle, NP Cecilie Kicks, NP . Kathyrn Drown, NP  Any Other Special Instructions Will Be Listed Below (If Applicable).  Please make sure you are taking in an adequate amount of fluid a day.       Signed, Sinclair Grooms, MD  05/25/2019 10:02 AM    [place]

## 2019-05-25 ENCOUNTER — Ambulatory Visit (INDEPENDENT_AMBULATORY_CARE_PROVIDER_SITE_OTHER): Payer: Medicare Other | Admitting: Interventional Cardiology

## 2019-05-25 ENCOUNTER — Other Ambulatory Visit: Payer: Self-pay

## 2019-05-25 ENCOUNTER — Encounter: Payer: Self-pay | Admitting: Interventional Cardiology

## 2019-05-25 VITALS — BP 92/48 | HR 69 | Ht 70.0 in | Wt 146.6 lb

## 2019-05-25 DIAGNOSIS — Z720 Tobacco use: Secondary | ICD-10-CM

## 2019-05-25 DIAGNOSIS — I5042 Chronic combined systolic (congestive) and diastolic (congestive) heart failure: Secondary | ICD-10-CM

## 2019-05-25 DIAGNOSIS — E782 Mixed hyperlipidemia: Secondary | ICD-10-CM

## 2019-05-25 DIAGNOSIS — I739 Peripheral vascular disease, unspecified: Secondary | ICD-10-CM

## 2019-05-25 DIAGNOSIS — I2510 Atherosclerotic heart disease of native coronary artery without angina pectoris: Secondary | ICD-10-CM | POA: Diagnosis not present

## 2019-05-25 DIAGNOSIS — I959 Hypotension, unspecified: Secondary | ICD-10-CM | POA: Diagnosis not present

## 2019-05-25 DIAGNOSIS — Z7189 Other specified counseling: Secondary | ICD-10-CM | POA: Diagnosis not present

## 2019-05-25 DIAGNOSIS — I10 Essential (primary) hypertension: Secondary | ICD-10-CM

## 2019-05-25 MED ORDER — CARVEDILOL 12.5 MG PO TABS: 12.5000 mg | ORAL_TABLET | Freq: Two times a day (BID) | ORAL | 3 refills | Status: DC

## 2019-05-25 NOTE — Patient Instructions (Signed)
Medication Instructions:  1) DECREASE Carvedilol to 12.5mg  twice daily  If you need a refill on your cardiac medications before your next appointment, please call your pharmacy.   Lab work: None If you have labs (blood work) drawn today and your tests are completely normal, you will receive your results only by: [name] MyChart Message (if you have MyChart) OR . A paper copy in the mail If you have any lab test that is abnormal or we need to change your treatment, we will call you to review the results.  Testing/Procedures: None  Follow-Up: At [facility], you and your health needs are our priority.  As part of our continuing mission to provide you with exceptional heart care, we have created designated Provider Care Teams.  These Care Teams include your primary Cardiologist (physician) and Advanced Practice Providers (APPs -  Physician Assistants and Nurse Practitioners) who all work together to provide you with the care you need, when you need it. You will need a follow up appointment in 3 months.  Please call our office 2 months in advance to schedule this appointment.  You may see Dr. Tamala Julian or one of the following Advanced Practice Providers on your designated Care Team:   Truitt Merle, NP Cecilie Kicks, NP . Kathyrn Drown, NP  Any Other Special Instructions Will Be Listed Below (If Applicable).  Please make sure you are taking in an adequate amount of fluid a day.

## 2019-07-14 ENCOUNTER — Ambulatory Visit (INDEPENDENT_AMBULATORY_CARE_PROVIDER_SITE_OTHER): Payer: Medicare Other | Admitting: Cardiovascular Disease

## 2019-07-14 ENCOUNTER — Other Ambulatory Visit: Payer: Self-pay

## 2019-07-14 ENCOUNTER — Encounter: Payer: Self-pay | Admitting: Cardiovascular Disease

## 2019-07-14 VITALS — BP 133/81 | HR 63 | Temp 96.1°F | Ht 71.0 in | Wt 146.8 lb

## 2019-07-14 DIAGNOSIS — Z720 Tobacco use: Secondary | ICD-10-CM

## 2019-07-14 DIAGNOSIS — I2510 Atherosclerotic heart disease of native coronary artery without angina pectoris: Secondary | ICD-10-CM

## 2019-07-14 DIAGNOSIS — E785 Hyperlipidemia, unspecified: Secondary | ICD-10-CM | POA: Diagnosis not present

## 2019-07-14 DIAGNOSIS — I739 Peripheral vascular disease, unspecified: Secondary | ICD-10-CM | POA: Diagnosis not present

## 2019-07-14 DIAGNOSIS — Z01818 Encounter for other preprocedural examination: Secondary | ICD-10-CM | POA: Diagnosis not present

## 2019-07-14 DIAGNOSIS — I10 Essential (primary) hypertension: Secondary | ICD-10-CM | POA: Diagnosis not present

## 2019-07-14 DIAGNOSIS — I5022 Chronic systolic (congestive) heart failure: Secondary | ICD-10-CM

## 2019-07-14 NOTE — Progress Notes (Signed)
Cardiology Office Note   Date:  07/14/2019   ID:  Nicholas Herrera, DOB 1941-05-16, MRN [id number]  PCP:  Nicholas Jordan, MD  Cardiologist:  Dr. Tamala Herrera  No chief complaint on file.     History of Present Illness: Nicholas Herrera is a 78 y.o. male who is here today for a follow-up visit regarding peripheral arterial disease. He has known history of coronary artery disease, chronic systolic/diastolic heart failure, tobacco use and hypertension.  He has known history of coronary artery disease, chronic systolic/diastolic heart failure, tobacco use and hypertension. He has known history of peripheral arterial disease with previous revascularization of the right proximal popliteal artery using a 6 x 100 mm self-expanding stent in 2014. Subsequent studies showed occluded right popliteal artery stent with 2 vessel runoff below the knee and mildly reduced ABI. He was seen in 2019 for severe left calf claudication. Angiography showed focal occlusion in the distal left SFA which was heavily calcified.  It was treated successfully with orbital atherectomy and drug-coated balloon angioplasty.    He had resolution of claudication and ABI went back to normal.  He had follow-up Doppler studies recently which showed patent left SFA.  However, there was new severe stenosis in the distal left common iliac artery extending into the external iliac artery with velocities above 600.   During last visit, he was noted to have elevated blood pressure and worsening exertional dyspnea.  I repeated his echocardiogram which showed worsening LV systolic function with an EF of 20 to 25%.  He was started on Entresto and followed up with Dr. Tamala Herrera.  He was noted to be mildly hypotensive and thus the dose of carvedilol was decreased.  He feels better overall.  He now reports significant left leg claudication with minimal walking.   Past Medical History:  Diagnosis Date  . Abdominal distention   . Abdominal pain   .  Bronchitis    hx of  . Cancer ([place])    skin  . CHF (congestive heart failure) (HCC)     (hx of EF 27%) s/p AICD 2004. Most recent LVEF > 40% , 8/11  . Chronic kidney disease    has small stones, no treatment  . Cough   . Depression    takes paxil  . Diverticulosis    hx of  . Dysrhythmia   . Easy bruising   . Hematuria    hx of  . Hernia october 2012   [facility]  . HTN (hypertension)   . Hyperlipidemia    takes zocor  . Inguinal hernia   . Left groin pain     Past Surgical History:  Procedure Laterality Date  . ABDOMINAL AORTOGRAM W/LOWER EXTREMITY N/A 09/03/2018   Procedure: ABDOMINAL AORTOGRAM W/LOWER EXTREMITY;  Surgeon: Wellington Hampshire, MD;  Location: [place] [facility];  Service: Cardiovascular;  Laterality: N/A;  . APPENDECTOMY    . CARDIAC CATHETERIZATION    . CARDIAC DEFIBRILLATOR REMOVAL  2005  . HERNIA REPAIR  09/11/11   repair of LIH   . INGUINAL HERNIA REPAIR  09/11/2011   Procedure: HERNIA REPAIR INGUINAL ADULT;  Surgeon: Judieth Keens, DO;  Location: [place];  Service: General;  Laterality: Left;  open left inguinal hernia with mesh  . LOWER EXTREMITY ANGIOGRAM N/A 02/05/2013   Procedure: LOWER EXTREMITY ANGIOGRAM;  Surgeon: Jettie Booze, MD;  Location: [facility];  Service: Cardiovascular;  Laterality: N/A;  . PERIPHERAL VASCULAR INTERVENTION Left 09/03/2018  Procedure: PERIPHERAL VASCULAR INTERVENTION;  Surgeon: Wellington Hampshire, MD;  Location: [place] [facility];  Service: Cardiovascular;  Laterality: Left;     Current Outpatient Medications  Medication Sig Dispense Refill  . carvedilol (COREG) 12.5 MG tablet Take 1 tablet (12.5 mg total) by mouth 2 (two) times daily with a meal. Take one and a half (1 1/2) tablets (18.75 mg) by mouth twice daily. 180 tablet 3  . clopidogrel (PLAVIX) 75 MG tablet Take 1 tablet (75 mg total) by mouth daily with breakfast. 30 tablet 11  . diphenhydramine-acetaminophen (TYLENOL PM) 25-500 MG TABS tablet Take 2  tablets by mouth at bedtime.    . mirabegron ER (MYRBETRIQ) 50 MG TB24 tablet Take 50 mg by mouth daily.    . Multiple Vitamins-Minerals (ONE-A-DAY MENS 50+ ADVANTAGE PO) Take 1 tablet by mouth daily.     [name] PARoxetine (PAXIL) 40 MG tablet Take 40 mg by mouth daily.    . rosuvastatin (CRESTOR) 40 MG tablet Take 1 tablet (40 mg total) by mouth daily. 90 tablet 3  . sacubitril-valsartan (ENTRESTO) 24-26 MG Take 1 tablet by mouth 2 (two) times daily. 180 tablet 3  . venlafaxine XR (EFFEXOR-XR) 75 MG 24 hr capsule Take 75 mg by mouth daily.    [name] FIBER PO Take 1 capsule by mouth 2 (two) times daily.     No current facility-administered medications for this visit.     Allergies:   Sulfa antibiotics    Social History:  The patient  reports that he has been smoking cigars. He has a 48.00 pack-year smoking history. He has never used smokeless tobacco. He reports current alcohol use of about 7.0 standard drinks of alcohol per week. He reports that he does not use drugs.   Family History:  The patient's family history includes Cancer in his mother.    ROS:  Please see the history of present illness.   Otherwise, review of systems are positive for none.   All other systems are reviewed and negative.    PHYSICAL EXAM: VS:  BP 133/81   Pulse 63   Temp (!) 96.1 F (35.6 C)   Ht 5\' 11"  (1.803 m)   Wt 146 lb 12.8 oz (66.6 kg)   SpO2 98%   BMI 20.47 kg/m  , BMI Body mass index is 20.47 kg/m. GEN: Well nourished, well developed, in no acute distress  HEENT: normal  Neck: no JVD, carotid bruits, or masses Cardiac: RRR; no murmurs, rubs, or gallops,no edema  Respiratory:  clear to auscultation bilaterally, normal work of breathing GI: soft, nontender, nondistended, + BS MS: no deformity or atrophy  Skin: warm and dry, no rash Neuro:  Strength and sensation are intact Psych: euthymic mood, full affect Vascular: Femoral pulse: +1 on the right side and barely palpable on the left side.  EKG:   EKG is ordered today. EKG showed sinus bradycardia with inferolateral T wave changes suggestive of ischemia.   Recent Labs: 08/26/2018: Hemoglobin 15.3; Platelets 242 05/15/2019: BUN 15; Creatinine, Ser 1.00; Potassium 4.2; Sodium 142    Lipid Panel No results found for: CHOL, TRIG, HDL, CHOLHDL, VLDL, LDLCALC, LDLDIRECT    Wt Readings from Last 3 Encounters:  07/14/19 146 lb 12.8 oz (66.6 kg)  05/25/19 146 lb 9.6 oz (66.5 kg)  04/28/19 155 lb (70.3 kg)       No flowsheet data found.    ASSESSMENT AND PLAN:   1.  Peripheral arterial disease: Status post revascularization of the  left distal SFA.  Vascular studies in May showed a drop in ABI on the left side with evidence of severe stenosis affecting the distal left common iliac artery into the external iliac artery.  He now reports significant worsening of left thigh and leg claudication which seems to be due to inflow disease.  This is clearly lifestyle limiting.  Thus, I recommend proceeding with angiography and possible endovascular intervention.  I discussed the procedure in details as well as risks and benefits. Planned access is via the right common femoral artery.   2. Tobacco use: I again discussed with him the importance of smoking cessation but he reports inability to quit.  3. Hyperlipidemia: The patient was switched to high-dose rosuvastatin with improvement in LDL to 93.  4. Coronary artery disease: No anginal symptoms.   5. Chronic systolic heart failure: His symptoms improved with addition of Entresto.  Continue carvedilol.  No evidence of volume overload.  6.  Essential hypertension: Blood pressure is well controlled on current medications    Disposition:   FU with me in 1 months  Signed,  Kathlyn Sacramento, MD  07/14/2019 5:27 PM    [place]

## 2019-07-14 NOTE — H&P (View-Only) (Signed)
Cardiology Office Note   Date:  07/14/2019   ID:  UNKOWN Nicholas Herrera, DOB Jan 07, 1941, MRN [id number]  PCP:  Jonathon Jordan, MD  Cardiologist:  Dr. Tamala Julian  No chief complaint on file.     History of Present Illness: Nicholas Herrera is a 78 y.o. male who is here today for a follow-up visit regarding peripheral arterial disease. He has known history of coronary artery disease, chronic systolic/diastolic heart failure, tobacco use and hypertension.  He has known history of coronary artery disease, chronic systolic/diastolic heart failure, tobacco use and hypertension. He has known history of peripheral arterial disease with previous revascularization of the right proximal popliteal artery using a 6 x 100 mm self-expanding stent in 2014. Subsequent studies showed occluded right popliteal artery stent with 2 vessel runoff below the knee and mildly reduced ABI. He was seen in 2019 for severe left calf claudication. Angiography showed focal occlusion in the distal left SFA which was heavily calcified.  It was treated successfully with orbital atherectomy and drug-coated balloon angioplasty.    He had resolution of claudication and ABI went back to normal.  He had follow-up Doppler studies recently which showed patent left SFA.  However, there was new severe stenosis in the distal left common iliac artery extending into the external iliac artery with velocities above 600.   During last visit, he was noted to have elevated blood pressure and worsening exertional dyspnea.  I repeated his echocardiogram which showed worsening LV systolic function with an EF of 20 to 25%.  He was started on Entresto and followed up with Dr. Tamala Julian.  He was noted to be mildly hypotensive and thus the dose of carvedilol was decreased.  He feels better overall.  He now reports significant left leg claudication with minimal walking.   Past Medical History:  Diagnosis Date  . Abdominal distention   . Abdominal pain   .  Bronchitis    hx of  . Cancer ([place])    skin  . CHF (congestive heart failure) (HCC)     (hx of EF 27%) s/p AICD 2004. Most recent LVEF > 40% , 8/11  . Chronic kidney disease    has small stones, no treatment  . Cough   . Depression    takes paxil  . Diverticulosis    hx of  . Dysrhythmia   . Easy bruising   . Hematuria    hx of  . Hernia october 2012   [facility]  . HTN (hypertension)   . Hyperlipidemia    takes zocor  . Inguinal hernia   . Left groin pain     Past Surgical History:  Procedure Laterality Date  . ABDOMINAL AORTOGRAM W/LOWER EXTREMITY N/A 09/03/2018   Procedure: ABDOMINAL AORTOGRAM W/LOWER EXTREMITY;  Surgeon: Wellington Hampshire, MD;  Location: [place] [facility];  Service: Cardiovascular;  Laterality: N/A;  . APPENDECTOMY    . CARDIAC CATHETERIZATION    . CARDIAC DEFIBRILLATOR REMOVAL  2005  . HERNIA REPAIR  09/11/11   repair of LIH   . INGUINAL HERNIA REPAIR  09/11/2011   Procedure: HERNIA REPAIR INGUINAL ADULT;  Surgeon: Judieth Keens, DO;  Location: [place];  Service: General;  Laterality: Left;  open left inguinal hernia with mesh  . LOWER EXTREMITY ANGIOGRAM N/A 02/05/2013   Procedure: LOWER EXTREMITY ANGIOGRAM;  Surgeon: Jettie Booze, MD;  Location: [facility];  Service: Cardiovascular;  Laterality: N/A;  . PERIPHERAL VASCULAR INTERVENTION Left 09/03/2018  Procedure: PERIPHERAL VASCULAR INTERVENTION;  Surgeon: Wellington Hampshire, MD;  Location: [place] [facility];  Service: Cardiovascular;  Laterality: Left;     Current Outpatient Medications  Medication Sig Dispense Refill  . carvedilol (COREG) 12.5 MG tablet Take 1 tablet (12.5 mg total) by mouth 2 (two) times daily with a meal. Take one and a half (1 1/2) tablets (18.75 mg) by mouth twice daily. 180 tablet 3  . clopidogrel (PLAVIX) 75 MG tablet Take 1 tablet (75 mg total) by mouth daily with breakfast. 30 tablet 11  . diphenhydramine-acetaminophen (TYLENOL PM) 25-500 MG TABS tablet Take 2  tablets by mouth at bedtime.    . mirabegron ER (MYRBETRIQ) 50 MG TB24 tablet Take 50 mg by mouth daily.    . Multiple Vitamins-Minerals (ONE-A-DAY MENS 50+ ADVANTAGE PO) Take 1 tablet by mouth daily.     [name] PARoxetine (PAXIL) 40 MG tablet Take 40 mg by mouth daily.    . rosuvastatin (CRESTOR) 40 MG tablet Take 1 tablet (40 mg total) by mouth daily. 90 tablet 3  . sacubitril-valsartan (ENTRESTO) 24-26 MG Take 1 tablet by mouth 2 (two) times daily. 180 tablet 3  . venlafaxine XR (EFFEXOR-XR) 75 MG 24 hr capsule Take 75 mg by mouth daily.    [name] FIBER PO Take 1 capsule by mouth 2 (two) times daily.     No current facility-administered medications for this visit.     Allergies:   Sulfa antibiotics    Social History:  The patient  reports that he has been smoking cigars. He has a 48.00 pack-year smoking history. He has never used smokeless tobacco. He reports current alcohol use of about 7.0 standard drinks of alcohol per week. He reports that he does not use drugs.   Family History:  The patient's family history includes Cancer in his mother.    ROS:  Please see the history of present illness.   Otherwise, review of systems are positive for none.   All other systems are reviewed and negative.    PHYSICAL EXAM: VS:  BP 133/81   Pulse 63   Temp (!) 96.1 F (35.6 C)   Ht 5\' 11"  (1.803 m)   Wt 146 lb 12.8 oz (66.6 kg)   SpO2 98%   BMI 20.47 kg/m  , BMI Body mass index is 20.47 kg/m. GEN: Well nourished, well developed, in no acute distress  HEENT: normal  Neck: no JVD, carotid bruits, or masses Cardiac: RRR; no murmurs, rubs, or gallops,no edema  Respiratory:  clear to auscultation bilaterally, normal work of breathing GI: soft, nontender, nondistended, + BS MS: no deformity or atrophy  Skin: warm and dry, no rash Neuro:  Strength and sensation are intact Psych: euthymic mood, full affect Vascular: Femoral pulse: +1 on the right side and barely palpable on the left side.  EKG:   EKG is ordered today. EKG showed sinus bradycardia with inferolateral T wave changes suggestive of ischemia.   Recent Labs: 08/26/2018: Hemoglobin 15.3; Platelets 242 05/15/2019: BUN 15; Creatinine, Ser 1.00; Potassium 4.2; Sodium 142    Lipid Panel No results found for: CHOL, TRIG, HDL, CHOLHDL, VLDL, LDLCALC, LDLDIRECT    Wt Readings from Last 3 Encounters:  07/14/19 146 lb 12.8 oz (66.6 kg)  05/25/19 146 lb 9.6 oz (66.5 kg)  04/28/19 155 lb (70.3 kg)       No flowsheet data found.    ASSESSMENT AND PLAN:   1.  Peripheral arterial disease: Status post revascularization of the  left distal SFA.  Vascular studies in May showed a drop in ABI on the left side with evidence of severe stenosis affecting the distal left common iliac artery into the external iliac artery.  He now reports significant worsening of left thigh and leg claudication which seems to be due to inflow disease.  This is clearly lifestyle limiting.  Thus, I recommend proceeding with angiography and possible endovascular intervention.  I discussed the procedure in details as well as risks and benefits. Planned access is via the right common femoral artery.   2. Tobacco use: I again discussed with him the importance of smoking cessation but he reports inability to quit.  3. Hyperlipidemia: The patient was switched to high-dose rosuvastatin with improvement in LDL to 93.  4. Coronary artery disease: No anginal symptoms.   5. Chronic systolic heart failure: His symptoms improved with addition of Entresto.  Continue carvedilol.  No evidence of volume overload.  6.  Essential hypertension: Blood pressure is well controlled on current medications    Disposition:   FU with me in 1 months  Signed,  Kathlyn Sacramento, MD  07/14/2019 5:27 PM    [place]

## 2019-07-14 NOTE — Patient Instructions (Signed)
Medication Instructions:  Your physician recommends that you continue on your current medications as directed. Please refer to the Current Medication list given to you today.  If you need a refill on your cardiac medications before your next appointment, please call your pharmacy.   Lab work: Your provider would like for you to have the following labs today: CBC and BMET  If you have labs (blood work) drawn today and your tests are completely normal, you will receive your results only by: [place] (if you have MyChart) OR A paper copy in the mail If you have any lab test that is abnormal or we need to change your treatment, we will call you to review the results.  Testing/Procedures: Your physician has requested that you have a peripheral vascular angiogram. This exam is performed at the hospital. During this exam IV contrast is used to look at arterial blood flow. Please review the information sheet given for details.  Follow-Up: At [facility], you and your health needs are our priority.  As part of our continuing mission to provide you with exceptional heart care, we have created designated Provider Care Teams.  These Care Teams include your primary Cardiologist (physician) and Advanced Practice Providers (APPs -  Physician Assistants and Nurse Practitioners) who all work together to provide you with the care you need, when you need it. You will need a follow up appointment in 1 months.  Please call our office 2 months in advance to schedule this appointment.  You may see Kathlyn Sacramento, MD or one of the following Advanced Practice Providers on your designated Care Team:   Kerin Ransom, [place] Roby Lofts, PA-C Sande Rives, [place]  Any Other Special Instructions Will Be Listed Below (If Applicable).    [place] [address] Dept: [phone] Loc: [place]  07/14/2019  You are scheduled for a Peripheral Angiogram on Wednesday, September 30 with Dr. Kathlyn Sacramento.  1. Please arrive at the [facility] (Main Entrance A) at [facility]: [address] [place], [place] [address] at 8:30 AM (This time is two hours before your procedure to ensure your preparation). Free valet parking service is available.   Special note: Every effort is made to have your procedure done on time. Please understand that emergencies sometimes delay scheduled procedures.  2. Diet: Do not eat solid foods after midnight.  The patient may have clear liquids until 5am upon the day of the procedure.  3. Labs: Your provider would like for you to have the following labs today, 07/14/2019: CBC and BMET   You will need to have the coronavirus test completed prior to your procedure. An appointment has been made at 12:20 pm on 07/25/2019. This is a Drive Up Visit at the [employer] [address] Someone will direct you to the appropriate testing line. Stay in your car and someone will be with you shortly. Please make sure to have all other labs completed before this test because you will need to stay quarantined until your procedure.   4. Medication instructions in preparation for your procedure: Nothing to hold.    On the morning of your procedure, take your Aspirin and Plavix/Clopidogrel and any morning medicines NOT listed above.  You may use sips of water.  5. Plan for one night stay--bring personal belongings. 6. Bring a current list of your medications and current insurance  cards. 7. You MUST have a responsible person to drive you home. 8. Someone MUST be with you the first 24 hours after you arrive home or your discharge will be delayed. 9. Please wear clothes that are easy to get on and off and wear slip-on shoes.  Thank you for allowing [place] to care for you!   -- [facility]

## 2019-07-15 LAB — CBC
Hematocrit: 43.1 % (ref 37.5–51.0)
Hemoglobin: 14.9 g/dL (ref 13.0–17.7)
MCH: 32.1 pg (ref 26.6–33.0)
MCHC: 34.6 g/dL (ref 31.5–35.7)
MCV: 93 fL (ref 79–97)
Platelets: 194 10*3/uL (ref 150–450)
RBC: 4.64 x10E6/uL (ref 4.14–5.80)
RDW: 13.2 % (ref 11.6–15.4)
WBC: 8.4 10*3/uL (ref 3.4–10.8)

## 2019-07-15 LAB — BASIC METABOLIC PANEL
BUN/Creatinine Ratio: 17 (ref 10–24)
BUN: 16 mg/dL (ref 8–27)
CO2: 25 mmol/L (ref 20–29)
Calcium: 9.2 mg/dL (ref 8.6–10.2)
Chloride: 101 mmol/L (ref 96–106)
Creatinine, Ser: 0.95 mg/dL (ref 0.76–1.27)
GFR calc Af Amer: 90 mL/min/{1.73_m2} (ref >59)
GFR calc non Af Amer: 77 mL/min/{1.73_m2} (ref >59)
Glucose: 92 mg/dL (ref 65–99)
Potassium: 4.2 mmol/L (ref 3.5–5.2)
Sodium: 142 mmol/L (ref 134–144)

## 2019-07-16 DIAGNOSIS — Z23 Encounter for immunization: Secondary | ICD-10-CM | POA: Diagnosis not present

## 2019-07-21 DIAGNOSIS — H43813 Vitreous degeneration, bilateral: Secondary | ICD-10-CM | POA: Diagnosis not present

## 2019-07-21 DIAGNOSIS — H52223 Regular astigmatism, bilateral: Secondary | ICD-10-CM | POA: Diagnosis not present

## 2019-07-21 DIAGNOSIS — H26493 Other secondary cataract, bilateral: Secondary | ICD-10-CM | POA: Diagnosis not present

## 2019-07-21 DIAGNOSIS — Z961 Presence of intraocular lens: Secondary | ICD-10-CM | POA: Diagnosis not present

## 2019-07-21 DIAGNOSIS — H524 Presbyopia: Secondary | ICD-10-CM | POA: Diagnosis not present

## 2019-07-21 DIAGNOSIS — H35033 Hypertensive retinopathy, bilateral: Secondary | ICD-10-CM | POA: Diagnosis not present

## 2019-07-25 ENCOUNTER — Other Ambulatory Visit (HOSPITAL_COMMUNITY)
Admission: RE | Admit: 2019-07-25 | Discharge: 2019-07-25 | Disposition: A | Discharge status: Home / Self Care | Payer: Medicare Other | Source: Ambulatory Visit | Attending: Cardiovascular Disease | Admitting: Cardiovascular Disease

## 2019-07-25 DIAGNOSIS — Z20828 Contact with and (suspected) exposure to other viral communicable diseases: Secondary | ICD-10-CM | POA: Diagnosis not present

## 2019-07-25 DIAGNOSIS — Z01812 Encounter for preprocedural laboratory examination: Secondary | ICD-10-CM | POA: Insufficient documentation

## 2019-07-26 LAB — NOVEL CORONAVIRUS, NAA (HOSP ORDER, SEND-OUT TO REF LAB; TAT 18-24 HRS): SARS-CoV-2, NAA: NOT DETECTED

## 2019-07-28 ENCOUNTER — Telehealth: Payer: Self-pay | Admitting: *Deleted

## 2019-07-28 NOTE — Telephone Encounter (Signed)
Pt contacted pre-abdominal aortogram scheduled at [facility] for: Wednesday July 29, 2019 10:30 AM Verified arrival time and place: [facility]) at: 8:30 AM   No solid food after midnight prior to cath, clear liquids until 5 AM day of procedure. Contrast allergy: no   AM meds can be  taken pre-cath with sip of water including: ASA 81 mg Plavix 75 mg  Confirmed patient has responsible person to drive home post procedure and observe 24 hours after arriving home: yes  Currently, due to Covid-19 pandemic, only one support person will be allowed with patient. Must be the same support person for that patient's entire stay, will be screened and required to wear a mask. They will be asked to wait in the waiting room for the duration of the patient's stay.  Patients are required to wear a mask when they enter the hospital.      COVID-19 Pre-Screening Questions:  . In the past 7 to 10 days have you had a cough,  shortness of breath, headache, congestion, fever (100 or greater) body aches, chills, sore throat, or sudden loss of taste or sense of smell? no . Have you been around anyone with known Covid 19? no . Have you been around anyone who is awaiting Covid 19 test results in the past 7 to 10 days? no . Have you been around anyone who has been exposed to Covid 19, or has mentioned symptoms of Covid 19 within the past 7 to 10 days? no   I reviewed procedure/mask/visitor, Covid-19 screening questions with patient, he verbalized understanding, thanked me for call.

## 2019-07-29 ENCOUNTER — Ambulatory Visit (HOSPITAL_COMMUNITY)
Admission: RE | Admit: 2019-07-29 | Discharge: 2019-07-29 | Disposition: A | Discharge status: Home / Self Care | Payer: Medicare Other | Attending: Cardiovascular Disease | Admitting: Cardiovascular Disease

## 2019-07-29 ENCOUNTER — Other Ambulatory Visit: Payer: Self-pay | Admitting: *Deleted

## 2019-07-29 ENCOUNTER — Encounter (HOSPITAL_COMMUNITY): Payer: Self-pay | Admitting: *Deleted

## 2019-07-29 ENCOUNTER — Encounter (HOSPITAL_COMMUNITY)
Admission: RE | Disposition: A | Discharge status: Home / Self Care | Payer: Self-pay | Source: Home / Self Care | Attending: Cardiovascular Disease

## 2019-07-29 ENCOUNTER — Other Ambulatory Visit: Payer: Self-pay

## 2019-07-29 DIAGNOSIS — Z95810 Presence of automatic (implantable) cardiac defibrillator: Secondary | ICD-10-CM | POA: Insufficient documentation

## 2019-07-29 DIAGNOSIS — I2510 Atherosclerotic heart disease of native coronary artery without angina pectoris: Secondary | ICD-10-CM | POA: Insufficient documentation

## 2019-07-29 DIAGNOSIS — I5022 Chronic systolic (congestive) heart failure: Secondary | ICD-10-CM | POA: Diagnosis not present

## 2019-07-29 DIAGNOSIS — Z79899 Other long term (current) drug therapy: Secondary | ICD-10-CM | POA: Insufficient documentation

## 2019-07-29 DIAGNOSIS — I739 Peripheral vascular disease, unspecified: Secondary | ICD-10-CM

## 2019-07-29 DIAGNOSIS — F329 Major depressive disorder, single episode, unspecified: Secondary | ICD-10-CM | POA: Insufficient documentation

## 2019-07-29 DIAGNOSIS — Z7902 Long term (current) use of antithrombotics/antiplatelets: Secondary | ICD-10-CM | POA: Diagnosis not present

## 2019-07-29 DIAGNOSIS — Z882 Allergy status to sulfonamides status: Secondary | ICD-10-CM | POA: Insufficient documentation

## 2019-07-29 DIAGNOSIS — I130 Hypertensive heart and chronic kidney disease with heart failure and stage 1 through stage 4 chronic kidney disease, or unspecified chronic kidney disease: Secondary | ICD-10-CM | POA: Diagnosis not present

## 2019-07-29 DIAGNOSIS — I70212 Atherosclerosis of native arteries of extremities with intermittent claudication, left leg: Secondary | ICD-10-CM | POA: Diagnosis not present

## 2019-07-29 DIAGNOSIS — F17290 Nicotine dependence, other tobacco product, uncomplicated: Secondary | ICD-10-CM | POA: Diagnosis not present

## 2019-07-29 DIAGNOSIS — N189 Chronic kidney disease, unspecified: Secondary | ICD-10-CM | POA: Insufficient documentation

## 2019-07-29 DIAGNOSIS — E785 Hyperlipidemia, unspecified: Secondary | ICD-10-CM | POA: Insufficient documentation

## 2019-07-29 HISTORY — PX: PERIPHERAL VASCULAR BALLOON ANGIOPLASTY: CATH118281

## 2019-07-29 HISTORY — PX: ABDOMINAL AORTOGRAM W/LOWER EXTREMITY: CATH118223

## 2019-07-29 LAB — POCT ACTIVATED CLOTTING TIME: Activated Clotting Time: 230 seconds

## 2019-07-29 SURGERY — ABDOMINAL AORTOGRAM W/LOWER EXTREMITY
Anesthesia: LOCAL

## 2019-07-29 MED ORDER — SODIUM CHLORIDE 0.9 % IV SOLN
INTRAVENOUS | Status: DC
  Administered 2019-07-29: 09:00:00 via INTRAVENOUS

## 2019-07-29 MED ORDER — MIDAZOLAM HCL 2 MG/2ML IJ SOLN
INTRAMUSCULAR | Status: AC
  Filled 2019-07-29: qty 2

## 2019-07-29 MED ORDER — FENTANYL CITRATE (PF) 100 MCG/2ML IJ SOLN
INTRAMUSCULAR | Status: DC | PRN
  Administered 2019-07-29: 25 ug via INTRAVENOUS

## 2019-07-29 MED ORDER — IODIXANOL 320 MG/ML IV SOLN
INTRAVENOUS | Status: DC | PRN
  Administered 2019-07-29: 100 mL via INTRA_ARTERIAL

## 2019-07-29 MED ORDER — HEPARIN SODIUM (PORCINE) 1000 UNIT/ML IJ SOLN
INTRAMUSCULAR | Status: DC | PRN
  Administered 2019-07-29: 5000 [IU] via INTRAVENOUS

## 2019-07-29 MED ORDER — MIDAZOLAM HCL 2 MG/2ML IJ SOLN
INTRAMUSCULAR | Status: DC | PRN
  Administered 2019-07-29: 1 mg via INTRAVENOUS

## 2019-07-29 MED ORDER — HEPARIN (PORCINE) IN NACL 1000-0.9 UT/500ML-% IV SOLN
INTRAVENOUS | Status: DC | PRN
  Administered 2019-07-29 (×2): 500 mL

## 2019-07-29 MED ORDER — ONDANSETRON HCL 4 MG/2ML IJ SOLN: 4.0000 mg | Freq: Four times a day (QID) | INTRAMUSCULAR | Status: DC | PRN

## 2019-07-29 MED ORDER — SODIUM CHLORIDE 0.9% FLUSH: 3.0000 mL | Freq: Two times a day (BID) | INTRAVENOUS | Status: DC

## 2019-07-29 MED ORDER — LIDOCAINE HCL (PF) 1 % IJ SOLN
INTRAMUSCULAR | Status: AC
  Filled 2019-07-29: qty 30

## 2019-07-29 MED ORDER — SODIUM CHLORIDE 0.9 % IV SOLN: 250.0000 mL | INTRAVENOUS | Status: DC | PRN

## 2019-07-29 MED ORDER — HYDRALAZINE HCL 20 MG/ML IJ SOLN: 5.0000 mg | INTRAMUSCULAR | Status: DC | PRN

## 2019-07-29 MED ORDER — ASPIRIN 81 MG PO CHEW: 81.0000 mg | CHEWABLE_TABLET | Freq: Every day | ORAL | 11 refills | Status: AC

## 2019-07-29 MED ORDER — SODIUM CHLORIDE 0.9% FLUSH: 3.0000 mL | INTRAVENOUS | Status: DC | PRN

## 2019-07-29 MED ORDER — HEPARIN (PORCINE) IN NACL 1000-0.9 UT/500ML-% IV SOLN
INTRAVENOUS | Status: AC
  Filled 2019-07-29: qty 1000

## 2019-07-29 MED ORDER — FENTANYL CITRATE (PF) 100 MCG/2ML IJ SOLN
INTRAMUSCULAR | Status: AC
  Filled 2019-07-29: qty 2

## 2019-07-29 MED ORDER — HYDRALAZINE HCL 20 MG/ML IJ SOLN
INTRAMUSCULAR | Status: DC | PRN
  Administered 2019-07-29: 10 mg via INTRAVENOUS

## 2019-07-29 MED ORDER — SODIUM CHLORIDE 0.9 % IV SOLN: INTRAVENOUS | Status: DC

## 2019-07-29 MED ORDER — LIDOCAINE HCL (PF) 1 % IJ SOLN
INTRAMUSCULAR | Status: DC | PRN
  Administered 2019-07-29: 15 mL via INTRADERMAL

## 2019-07-29 MED ORDER — ASPIRIN 81 MG PO CHEW: 81.0000 mg | CHEWABLE_TABLET | ORAL | Status: DC

## 2019-07-29 MED ORDER — ACETAMINOPHEN 325 MG PO TABS: 650.0000 mg | ORAL_TABLET | ORAL | Status: DC | PRN

## 2019-07-29 SURGICAL SUPPLY — 23 items
BALLN CHOCOLATE 5.0X40X120 (BALLOONS) ×3
BALLN IN.PACT DCB 6X150 (BALLOONS) ×3
BALLOON CHOCOLATE 5.0X40X120 (BALLOONS) ×2 IMPLANT
CATH ANGIO 5F PIGTAIL 65CM (CATHETERS) ×3 IMPLANT
CATH CROSS OVER TEMPO 5F (CATHETERS) ×3 IMPLANT
CLOSURE MYNX CONTROL 6F/7F (Vascular Products) ×3 IMPLANT
DCB IN.PACT 6X150 (BALLOONS) ×2 IMPLANT
KIT ENCORE 26 ADVANTAGE (KITS) ×3 IMPLANT
KIT MICROPUNCTURE NIT STIFF (SHEATH) ×6 IMPLANT
KIT PV (KITS) ×3 IMPLANT
SHEATH PINNACLE 5F 10CM (SHEATH) ×3 IMPLANT
SHEATH PINNACLE 6F 10CM (SHEATH) ×3 IMPLANT
SHEATH PINNACLE MP 6F 45CM (SHEATH) ×3 IMPLANT
SHEATH PINNACLE ST 7F 45CM (SHEATH) ×3 IMPLANT
SHEATH PROBE COVER 6X72 (BAG) ×3 IMPLANT
STOPCOCK MORSE 400PSI 3WAY (MISCELLANEOUS) ×3 IMPLANT
SYR MEDRAD MARK 7 150ML (SYRINGE) ×3 IMPLANT
TRANSDUCER W/STOPCOCK (MISCELLANEOUS) ×3 IMPLANT
TRAY PV CATH (CUSTOM PROCEDURE TRAY) ×3 IMPLANT
TUBING CIL FLEX 10 FLL-RA (TUBING) ×3 IMPLANT
WIRE HITORQ VERSACORE ST 145CM (WIRE) ×3 IMPLANT
WIRE SPARTACORE .014X300CM (WIRE) ×3 IMPLANT
WIRE VERSACORE LOC 115CM (WIRE) ×3 IMPLANT

## 2019-07-29 NOTE — Progress Notes (Signed)
No bleeding or hematoma noted after ambulation 

## 2019-07-29 NOTE — Interval H&P Note (Signed)
History and Physical Interval Note:  07/29/2019 10:02 AM  Nicholas Herrera  has presented today for surgery, with the diagnosis of pad.  The various methods of treatment have been discussed with the patient and family. After consideration of risks, benefits and other options for treatment, the patient has consented to  Procedure(s): ABDOMINAL AORTOGRAM W/LOWER EXTREMITY (N/A) as a surgical intervention.  The patient's history has been reviewed, patient examined, no change in status, stable for surgery.  I have reviewed the patient's chart and labs.  Questions were answered to the patient's satisfaction.     Kathlyn Sacramento

## 2019-07-29 NOTE — Discharge Instructions (Signed)

## 2019-07-31 ENCOUNTER — Encounter (HOSPITAL_COMMUNITY): Payer: Self-pay | Admitting: Cardiovascular Disease

## 2019-08-11 ENCOUNTER — Ambulatory Visit (HOSPITAL_COMMUNITY)
Admission: RE | Admit: 2019-08-11 | Discharge: 2019-08-11 | Disposition: A | Discharge status: Home / Self Care | Payer: Medicare Other | Source: Ambulatory Visit | Attending: Cardiovascular Disease | Admitting: Cardiovascular Disease

## 2019-08-11 ENCOUNTER — Other Ambulatory Visit: Payer: Self-pay

## 2019-08-11 ENCOUNTER — Other Ambulatory Visit (HOSPITAL_COMMUNITY): Payer: Self-pay | Admitting: Cardiovascular Disease

## 2019-08-11 ENCOUNTER — Ambulatory Visit (HOSPITAL_BASED_OUTPATIENT_CLINIC_OR_DEPARTMENT_OTHER)
Admission: RE | Admit: 2019-08-11 | Discharge: 2019-08-11 | Disposition: A | Discharge status: Home / Self Care | Payer: Medicare Other | Source: Ambulatory Visit | Attending: Cardiovascular Disease | Admitting: Cardiovascular Disease

## 2019-08-11 DIAGNOSIS — I739 Peripheral vascular disease, unspecified: Secondary | ICD-10-CM | POA: Insufficient documentation

## 2019-08-13 ENCOUNTER — Other Ambulatory Visit: Payer: Self-pay | Admitting: *Deleted

## 2019-08-13 DIAGNOSIS — I739 Peripheral vascular disease, unspecified: Secondary | ICD-10-CM

## 2019-08-26 DIAGNOSIS — L821 Other seborrheic keratosis: Secondary | ICD-10-CM | POA: Diagnosis not present

## 2019-08-26 DIAGNOSIS — D1801 Hemangioma of skin and subcutaneous tissue: Secondary | ICD-10-CM | POA: Diagnosis not present

## 2019-08-26 DIAGNOSIS — D485 Neoplasm of uncertain behavior of skin: Secondary | ICD-10-CM | POA: Diagnosis not present

## 2019-08-26 DIAGNOSIS — C4442 Squamous cell carcinoma of skin of scalp and neck: Secondary | ICD-10-CM | POA: Diagnosis not present

## 2019-08-26 DIAGNOSIS — D0471 Carcinoma in situ of skin of right lower limb, including hip: Secondary | ICD-10-CM | POA: Diagnosis not present

## 2019-08-26 DIAGNOSIS — L570 Actinic keratosis: Secondary | ICD-10-CM | POA: Diagnosis not present

## 2019-08-26 DIAGNOSIS — Z85828 Personal history of other malignant neoplasm of skin: Secondary | ICD-10-CM | POA: Diagnosis not present

## 2019-08-26 DIAGNOSIS — D225 Melanocytic nevi of trunk: Secondary | ICD-10-CM | POA: Diagnosis not present

## 2019-08-26 DIAGNOSIS — L820 Inflamed seborrheic keratosis: Secondary | ICD-10-CM | POA: Diagnosis not present

## 2019-08-26 NOTE — Progress Notes (Signed)
Cardiology Office Note:    Date:  08/27/2019   ID:  [name], DOB 10/25/1941, MRN [id number]  PCP:  Jonathon Jordan, MD  Cardiologist:  Sinclair Grooms, MD   Referring MD: Jonathon Jordan, MD   Chief Complaint  Patient presents with   Congestive Heart Failure   Claudication   Follow-up    tobacco and ETOH    History of Present Illness:    Nicholas Herrera is a 78 y.o. male with a hx of  a hx of hypertension, coronary disease (? non-obstructive), chronic combined systolic and diastolic heart failure EF <30%--> 45% (2011)--> 25% (04/2019), initial AICD 2004 now deactivated, CKD stage III, essential hypertension, complex PAD followed (Dr. Kathlyn Sacramento), and hyperlipidemia. Recent Left Iliac DCB angioplasty.  The patient has improved left leg claudication after recent drug-coated balloon angioplasty of the left iliac and superficial femoral.  He denies angina pectoris.  Over the summer, an updated echo demonstrated severe reduction in LVEF, less than 30%.  He has had cyclical LV dysfunction over the past 15 years.  He had AICD implantation in 2005 and on subsequent evaluation had lead dysfunction x2.  Eventually in 2008 LV function had improved to EF greater than 40% and the device was disarmed.  He is very sedentary but has no overt heart failure complaints.  For years he has consumed 3 or more alcohol drinks per day.  He also continues to smoke cigarettes greater than 1/2 pack/day.  He denies orthopnea, PND, edema, palpitations, and syncope.  Past Medical History:  Diagnosis Date   Abdominal distention    Abdominal pain    Bronchitis    hx of   Cancer (HCC)    skin   CHF (congestive heart failure) (HCC)     (hx of EF 27%) s/p AICD 2004. Most recent LVEF > 40% , 8/11   Chronic kidney disease    has small stones, no treatment   Cough    Depression    takes paxil   Diverticulosis    hx of   Dysrhythmia    Easy bruising    Hematuria    hx of    Hernia october 2012   [facility]   HTN (hypertension)    Hyperlipidemia    takes zocor   Inguinal hernia    Left groin pain     Past Surgical History:  Procedure Laterality Date   ABDOMINAL AORTOGRAM W/LOWER EXTREMITY N/A 09/03/2018   Procedure: ABDOMINAL AORTOGRAM W/LOWER EXTREMITY;  Surgeon: Wellington Hampshire, MD;  Location: [place] [facility];  Service: Cardiovascular;  Laterality: N/A;   ABDOMINAL AORTOGRAM W/LOWER EXTREMITY N/A 07/29/2019   Procedure: ABDOMINAL AORTOGRAM W/LOWER EXTREMITY;  Surgeon: Wellington Hampshire, MD;  Location: [place] [facility];  Service: Cardiovascular;  Laterality: N/A;   APPENDECTOMY     CARDIAC CATHETERIZATION     CARDIAC DEFIBRILLATOR REMOVAL  2005   HERNIA REPAIR  09/11/11   repair of [facility]    INGUINAL HERNIA REPAIR  09/11/2011   Procedure: HERNIA REPAIR INGUINAL ADULT;  Surgeon: Judieth Keens, DO;  Location: [place];  Service: General;  Laterality: Left;  open left inguinal hernia with mesh   LOWER EXTREMITY ANGIOGRAM N/A 02/05/2013   Procedure: LOWER EXTREMITY ANGIOGRAM;  Surgeon: Jettie Booze, MD;  Location: [facility];  Service: Cardiovascular;  Laterality: N/A;   PERIPHERAL VASCULAR BALLOON ANGIOPLASTY  07/29/2019   Procedure: PERIPHERAL VASCULAR BALLOON ANGIOPLASTY;  Surgeon: Wellington Hampshire, MD;  Location:  [place] [facility];  Service: Cardiovascular;;   PERIPHERAL VASCULAR INTERVENTION Left 09/03/2018   Procedure: PERIPHERAL VASCULAR INTERVENTION;  Surgeon: Wellington Hampshire, MD;  Location: [place] [facility];  Service: Cardiovascular;  Laterality: Left;    Current Medications: Current Meds  Medication Sig   aspirin (ASPIRIN CHILDRENS) 81 MG chewable tablet Chew 1 tablet (81 mg total) by mouth daily.   carvedilol (COREG) 12.5 MG tablet Take 1 tablet (12.5 mg total) by mouth 2 (two) times daily with a meal. Take one and a half (1 1/2) tablets (18.75 mg) by mouth twice daily. (Patient taking differently: Take 18.75 mg by mouth  2 (two) times daily with a meal. )   clopidogrel (PLAVIX) 75 MG tablet Take 1 tablet (75 mg total) by mouth daily with breakfast.   diphenhydramine-acetaminophen (TYLENOL PM) 25-500 MG TABS tablet Take 2 tablets by mouth at bedtime.   FIBER PO Take 1 capsule by mouth 2 (two) times daily.   mirabegron ER (MYRBETRIQ) 50 MG TB24 tablet Take 50 mg by mouth daily.   Multiple Vitamins-Minerals (ONE-A-DAY MENS 50+ ADVANTAGE PO) Take 1 tablet by mouth daily.    PARoxetine (PAXIL) 40 MG tablet Take 40 mg by mouth daily.   rosuvastatin (CRESTOR) 40 MG tablet Take 1 tablet (40 mg total) by mouth daily.   venlafaxine XR (EFFEXOR-XR) 75 MG 24 hr capsule Take 75 mg by mouth daily.   [DISCONTINUED] sacubitril-valsartan (ENTRESTO) 24-26 MG Take 1 tablet by mouth 2 (two) times daily.     Allergies:   Sulfa antibiotics   Social History   Socioeconomic History   Marital status: Married    Spouse name: Not on file   Number of children: Not on file   Years of education: Not on file   Highest education level: Not on file  Occupational History   Not on file  Social Needs   Financial resource strain: Not on file   Food insecurity    Worry: Not on file    Inability: Not on file   Transportation needs    Medical: Not on file    Non-medical: Not on file  Tobacco Use   Smoking status: Current Every Day Smoker    Packs/day: 1.00    Years: 48.00    Pack years: 48.00    Types: Cigars   Smokeless tobacco: Never Used  Substance and Sexual Activity   Alcohol use: Yes    Alcohol/week: 7.0 standard drinks    Types: 7 drink(s) per week   Drug use: No   Sexual activity: Not on file  Lifestyle   Physical activity    Days per week: Not on file    Minutes per session: Not on file   Stress: Not on file  Relationships   Social connections    Talks on phone: Not on file    Gets together: Not on file    Attends religious service: Not on file    Active member of club or  organization: Not on file    Attends meetings of clubs or organizations: Not on file    Relationship status: Not on file  Other Topics Concern   Not on file  Social History Narrative   Not on file     Family History: The patient's family history includes Cancer in his mother.  ROS:   Please see the history of present illness.    Had lead dysfunction x2 with prior AICD before it was deactivated in 2008.  Deactivation was  done at [facility] after documentation of improved LV function.  He drinks at least 3 rum and Cokes each evening.  Now having right calf claudication but it is not severe.  Left lower extremity claudication is markedly improved.  All other systems reviewed and are negative.  EKGs/Labs/Other Studies Reviewed:    The following studies were reviewed today:  ECHOCARDIOGRAM 04/2019: IMPRESSIONS    1. The left ventricle has severely reduced systolic function, with an ejection fraction of 20-25%. The cavity size was moderately dilated. Left ventricular diastolic Doppler parameters are consistent with impaired relaxation. Left ventricular diffuse  hypokinesis.  2. The right ventricle has normal systolic function. The cavity was normal.  3. The aortic valve is tricuspid. Mild calcification of the aortic valve. No stenosis of the aortic valve.  4. Severe global reduction in LV systolic function; moderate LVE; mild diastolic dysfunction.  EKG:  EKG not repeated and the most recent tracing performed 07/14/2019 demonstrates sinus bradycardia, interventricular conduction delay, prominent voltage and secondary nonspecific ST abnormality.  Recent Labs: 07/14/2019: BUN 16; Creatinine, Ser 0.95; Hemoglobin 14.9; Platelets 194; Potassium 4.2; Sodium 142  Recent Lipid Panel No results found for: CHOL, TRIG, HDL, CHOLHDL, VLDL, LDLCALC, LDLDIRECT  Physical Exam:    VS:  BP 112/72    Pulse 73    Ht 5\' 11"  (1.803 m)    Wt 149 lb (67.6 kg)    SpO2 97%    BMI 20.78 kg/m     Wt Readings  from Last 3 Encounters:  08/27/19 149 lb (67.6 kg)  07/29/19 145 lb (65.8 kg)  07/14/19 146 lb 12.8 oz (66.6 kg)     GEN: Appears older than stated age.. No acute distress HEENT: Normal NECK: No JVD. LYMPHATICS: No lymphadenopathy CARDIAC:  RRR without murmur, gallop, or edema. VASCULAR: Decreased pulses in right lower extremity.  Palpable femoral pulses bilaterally.  Cyanotic appearing feet. RESPIRATORY:  Clear to auscultation without rales, wheezing or rhonchi  ABDOMEN: Soft, non-tender, non-distended, No pulsatile mass, MUSCULOSKELETAL: No deformity  SKIN: Warm and dry NEUROLOGIC:  Alert and oriented x 3 PSYCHIATRIC:  Normal affect   ASSESSMENT:    1. Chronic systolic heart failure ([place])   2. Essential hypertension   3. Hyperlipidemia, unspecified hyperlipidemia type   4. Tobacco use   5. Coronary artery disease involving native coronary artery of native heart without angina pectoris   6. Peripheral vascular disease ([place])   7. Educated about COVID-19 virus infection    PLAN:    In order of problems listed above:  1. Recurrent systolic heart failure.  Possibly some component of alcohol induced LV dysfunction.  Strongly encouraged to discontinue smoking.  Further increase Entresto to 49/51 mg/day by doubling up on the morning dose of 2426 mg for 1 week and then both doses thereafter with blood pressure monitoring.  If we can maintain a systolic pressure above 123XX123 mmHg, will write a new prescription for the intermediate dose of Entresto.  I would like for him to have an advanced heart failure clinic evaluation to determine if we are hitting all potential therapies that are helpful.  I do not think he needs volume removal at this time.  I will also have him see EP to determine if reactivation/updated device is necessary.  The device was deactivated in 2008 when for the second time he had the lead malfunction. 2. Blood pressure control is excellent 3. LDL target is less than 70 and  was 93 in February 2020.  He is  on max dose rosuvastatin.  We should probably consider PCSK9. 4. Encouraged to discontinue smoking.  No commitment from the patient. 5. No clinical symptoms suggesting angina. 6. Active PAD in this smoker with right calf claudication and recent drug-coated balloon angioplasty of the left iliac and superficial femoral with marked improvement in left leg claudication. 7. The 3W's are endorsed as lifestyle changes by the patient.  Guideline directed therapy for left ventricular systolic dysfunction: Angiotensin receptor-neprilysin inhibitor (ARNI)-Entresto; beta-blocker therapy - carvedilol or metoprolol succinate; mineralocorticoid receptor antagonist (MRA) therapy -spironolactone or eplerenone.  These therapies have been shown to improve clinical outcomes including reduction of rehospitalization survival, and acute heart failure.  I decided against adding spironolactone until Entresto dose is optimized.  Medication Adjustments/Labs and Tests Ordered: Current medicines are reviewed at length with the patient today.  Concerns regarding medicines are outlined above.  Orders Placed This Encounter  Procedures   Basic metabolic panel   AMB referral to CHF clinic   Ambulatory referral to Cardiac Electrophysiology   Meds ordered this encounter  Medications   sacubitril-valsartan (ENTRESTO) 49-51 MG    Sig: Take 1 tablet by mouth 2 (two) times daily.    Dispense:  60 tablet    Refill:  11    Dose change    Patient Instructions  Medication Instructions:  1) INCREASE Entresto to 49/51 twice daily.  Dr. Tamala Julian recommends that you take two tablets of your 24/26mg  twice daily and monitor your blood pressure at least 2 hours after each dose and report those blood pressures back to [place] in 2 weeks.  *If you need a refill on your cardiac medications before your next appointment, please call your pharmacy*  Lab Work: Your physician recommends that you return for lab  work in: 3 weeks (BMET).  If you have labs (blood work) drawn today and your tests are completely normal, you will receive your results only by:  [place] (if you have MyChart) OR  A paper copy in the mail If you have any lab test that is abnormal or we need to change your treatment, we will call you to review the results.  Testing/Procedures: None  Follow-Up: At [facility], you and your health needs are our priority.  As part of our continuing mission to provide you with exceptional heart care, we have created designated Provider Care Teams.  These Care Teams include your primary Cardiologist (physician) and Advanced Practice Providers (APPs -  Physician Assistants and Nurse Practitioners) who all work together to provide you with the care you need, when you need it.  Your next appointment:   6 months  The format for your next appointment:   In Person  Provider:   You may see Sinclair Grooms, MD or one of the following Advanced Practice Providers on your designated Care Team:    Truitt Merle, NP  Cecilie Kicks, NP  Kathyrn Drown, NP   Other Instructions  You have been referred to Dr. Cristopher Peru in our Electrophysiology Department.  You have been referred to our Advanced Heart Failure Team (Dr. Aundra Dubin and Dr. Haroldine Laws).  We would like for you to see them in the next 2-3 months.        Signed, Sinclair Grooms, MD  08/27/2019 9:54 AM    [place]

## 2019-08-27 ENCOUNTER — Encounter: Payer: Self-pay | Admitting: Interventional Cardiology

## 2019-08-27 ENCOUNTER — Ambulatory Visit (INDEPENDENT_AMBULATORY_CARE_PROVIDER_SITE_OTHER): Payer: Medicare Other | Admitting: Interventional Cardiology

## 2019-08-27 ENCOUNTER — Other Ambulatory Visit: Payer: Self-pay

## 2019-08-27 VITALS — BP 112/72 | HR 73 | Ht 71.0 in | Wt 149.0 lb

## 2019-08-27 DIAGNOSIS — I2510 Atherosclerotic heart disease of native coronary artery without angina pectoris: Secondary | ICD-10-CM | POA: Diagnosis not present

## 2019-08-27 DIAGNOSIS — Z7189 Other specified counseling: Secondary | ICD-10-CM

## 2019-08-27 DIAGNOSIS — I10 Essential (primary) hypertension: Secondary | ICD-10-CM

## 2019-08-27 DIAGNOSIS — E785 Hyperlipidemia, unspecified: Secondary | ICD-10-CM

## 2019-08-27 DIAGNOSIS — I739 Peripheral vascular disease, unspecified: Secondary | ICD-10-CM | POA: Diagnosis not present

## 2019-08-27 DIAGNOSIS — Z720 Tobacco use: Secondary | ICD-10-CM | POA: Diagnosis not present

## 2019-08-27 DIAGNOSIS — I5022 Chronic systolic (congestive) heart failure: Secondary | ICD-10-CM

## 2019-08-27 MED ORDER — ENTRESTO 49-51 MG PO TABS: 1.0000 | ORAL_TABLET | Freq: Two times a day (BID) | ORAL | 11 refills | Status: DC

## 2019-08-27 NOTE — Patient Instructions (Signed)
Medication Instructions:  1) INCREASE Entresto to 49/51 twice daily.  Dr. Tamala Julian recommends that you take two tablets of your 24/26mg  twice daily and monitor your blood pressure at least 2 hours after each dose and report those blood pressures back to [place] in 2 weeks.  *If you need a refill on your cardiac medications before your next appointment, please call your pharmacy*  Lab Work: Your physician recommends that you return for lab work in: 3 weeks (BMET).  If you have labs (blood work) drawn today and your tests are completely normal, you will receive your results only by: [name] MyChart Message (if you have MyChart) OR . A paper copy in the mail If you have any lab test that is abnormal or we need to change your treatment, we will call you to review the results.  Testing/Procedures: None  Follow-Up: At [facility], you and your health needs are our priority.  As part of our continuing mission to provide you with exceptional heart care, we have created designated Provider Care Teams.  These Care Teams include your primary Cardiologist (physician) and Advanced Practice Providers (APPs -  Physician Assistants and Nurse Practitioners) who all work together to provide you with the care you need, when you need it.  Your next appointment:   6 months  The format for your next appointment:   In Person  Provider:   You may see Sinclair Grooms, MD or one of the following Advanced Practice Providers on your designated Care Team:    Truitt Merle, NP  Cecilie Kicks, NP  Kathyrn Drown, NP   Other Instructions  You have been referred to Dr. Cristopher Peru in our Electrophysiology Department.  You have been referred to our Advanced Heart Failure Team (Dr. Aundra Dubin and Dr. Haroldine Laws).  We would like for you to see them in the next 2-3 months.

## 2019-09-01 ENCOUNTER — Encounter: Payer: Self-pay | Admitting: Cardiovascular Disease

## 2019-09-01 ENCOUNTER — Other Ambulatory Visit: Payer: Self-pay

## 2019-09-01 ENCOUNTER — Ambulatory Visit (INDEPENDENT_AMBULATORY_CARE_PROVIDER_SITE_OTHER): Payer: Medicare Other | Admitting: Cardiovascular Disease

## 2019-09-01 VITALS — BP 143/82 | HR 63 | Temp 97.2°F | Ht 71.0 in | Wt 151.2 lb

## 2019-09-01 DIAGNOSIS — I5022 Chronic systolic (congestive) heart failure: Secondary | ICD-10-CM | POA: Diagnosis not present

## 2019-09-01 DIAGNOSIS — I739 Peripheral vascular disease, unspecified: Secondary | ICD-10-CM | POA: Diagnosis not present

## 2019-09-01 DIAGNOSIS — Z720 Tobacco use: Secondary | ICD-10-CM | POA: Diagnosis not present

## 2019-09-01 DIAGNOSIS — I10 Essential (primary) hypertension: Secondary | ICD-10-CM

## 2019-09-01 DIAGNOSIS — I2510 Atherosclerotic heart disease of native coronary artery without angina pectoris: Secondary | ICD-10-CM | POA: Diagnosis not present

## 2019-09-01 DIAGNOSIS — E785 Hyperlipidemia, unspecified: Secondary | ICD-10-CM

## 2019-09-01 NOTE — Patient Instructions (Signed)
Medication Instructions:  No changes *If you need a refill on your cardiac medications before your next appointment, please call your pharmacy*  Lab Work: None ordered If you have labs (blood work) drawn today and your tests are completely normal, you will receive your results only by: [name] MyChart Message (if you have MyChart) OR . A paper copy in the mail If you have any lab test that is abnormal or we need to change your treatment, we will call you to review the results.  Testing/Procedures: Your physician has requested that you have an Aorta/Iliac Duplex in 6 months. This will be take place at [place], [address].    No food after 11PM the night before.  Water is OK. (Don't drink liquids if you have been instructed not to for ANOTHER test).  Take two Extra-Strength Gas-X capsules at bedtime the night before test.   Take an additional two Extra-Strength Gas-X capsules three (3) hours before the test or first thing in the morning.    Avoid foods that produce bowel gas, for 24 hours prior to exam (see below).    No breakfast, no chewing gum, no smoking or carbonated beverages.  Patient may take morning medications with water.  Come in for test at least 15 minutes early to register.  Your physician has requested that you have an ankle brachial index (ABI) in 6 months. During this test an ultrasound and blood pressure cuff are used to evaluate the arteries that supply the arms and legs with blood. Allow thirty minutes for this exam. There are no restrictions or special instructions. This will take place at [place], [address].    Follow-Up: At [facility], you and your health needs are our priority.  As part of our continuing mission to provide you with exceptional heart care, we have created designated Provider Care Teams.  These Care Teams include your primary Cardiologist (physician) and Advanced Practice Providers (APPs -  Physician Assistants and Nurse  Practitioners) who all work together to provide you with the care you need, when you need it.  Your next appointment:   6 months  The format for your next appointment:   In Person  Provider:   Kathlyn Sacramento, MD

## 2019-09-01 NOTE — Progress Notes (Signed)
Cardiology Office Note   Date:  09/01/2019   ID:  BROC PARKS, DOB [birth year], MRN [id number]  PCP:  Jonathon Jordan, MD  Cardiologist:  Dr. Tamala Julian  No chief complaint on file.     History of Present Illness: Nicholas Herrera is a 78 y.o. male who is here today for a follow-up visit regarding peripheral arterial disease. He has known history of coronary artery disease, chronic systolic/diastolic heart failure, tobacco use and hypertension.  He has known history of coronary artery disease, chronic systolic/diastolic heart failure, tobacco use and hypertension. He has known history of peripheral arterial disease with previous revascularization of the right proximal popliteal artery using a 6 x 100 mm self-expanding stent in 2014. Subsequent studies showed occluded right popliteal artery stent with 2 vessel runoff below the knee and mildly reduced ABI. He was seen in 2019 for severe left calf claudication. Angiography showed focal occlusion in the distal left SFA which was heavily calcified.  It was treated successfully with orbital atherectomy and drug-coated balloon angioplasty.      Most recently, he developed recurrent left calf claudication. Doppler studies recently which showed patent left SFA.  However, there was new severe stenosis in the distal left common iliac artery extending into the external iliac artery with velocities above 600.  I proceeded with angiography in late September which showed moderate right common iliac, external iliac and common femoral artery disease.  On the left, there was severe stenosis affecting the distal common iliac artery and distal external iliac artery.  I performed successful drug-coated balloon angioplasty to the left external and common iliac arteries.  Postprocedure ABI improved to normal on the left side.  The patient reports resolution of claudication on the left side and he continues to have mild right calf claudication.  He has not been  able to quit smoking.   Past Medical History:  Diagnosis Date  . Abdominal distention   . Abdominal pain   . Bronchitis    hx of  . Cancer ([place])    skin  . CHF (congestive heart failure) (HCC)     (hx of EF 27%) s/p AICD 2004. Most recent LVEF > 40% , 8/11  . Chronic kidney disease    has small stones, no treatment  . Cough   . Depression    takes paxil  . Diverticulosis    hx of  . Dysrhythmia   . Easy bruising   . Hematuria    hx of  . Hernia october 2012   [facility]  . HTN (hypertension)   . Hyperlipidemia    takes zocor  . Inguinal hernia   . Left groin pain     Past Surgical History:  Procedure Laterality Date  . ABDOMINAL AORTOGRAM W/LOWER EXTREMITY N/A 09/03/2018   Procedure: ABDOMINAL AORTOGRAM W/LOWER EXTREMITY;  Surgeon: Wellington Hampshire, MD;  Location: [place] [facility];  Service: Cardiovascular;  Laterality: N/A;  . ABDOMINAL AORTOGRAM W/LOWER EXTREMITY N/A 07/29/2019   Procedure: ABDOMINAL AORTOGRAM W/LOWER EXTREMITY;  Surgeon: Wellington Hampshire, MD;  Location: [place] [facility];  Service: Cardiovascular;  Laterality: N/A;  . APPENDECTOMY    . CARDIAC CATHETERIZATION    . CARDIAC DEFIBRILLATOR REMOVAL  2005  . HERNIA REPAIR  09/11/11   repair of LIH   . INGUINAL HERNIA REPAIR  09/11/2011   Procedure: HERNIA REPAIR INGUINAL ADULT;  Surgeon: Judieth Keens, DO;  Location: [place];  Service: General;  Laterality: Left;  open left  inguinal hernia with mesh  . LOWER EXTREMITY ANGIOGRAM N/A 02/05/2013   Procedure: LOWER EXTREMITY ANGIOGRAM;  Surgeon: Jettie Booze, MD;  Location: [facility] C[facility] LAB;  Service: Cardiovascular;  Laterality: N/A;  . PERIPHERAL VASCULAR BALLOON ANGIOPLASTY  07/29/2019   Procedure: PERIPHERAL VASCULAR BALLOON ANGIOPLASTY;  Surgeon: Wellington Hampshire, MD;  Location: [place] [facility];  Service: Cardiovascular;;  . PERIPHERAL VASCULAR INTERVENTION Left 09/03/2018   Procedure: PERIPHERAL VASCULAR INTERVENTION;  Surgeon: Wellington Hampshire,  MD;  Location: [place] [facility];  Service: Cardiovascular;  Laterality: Left;     Current Outpatient Medications  Medication Sig Dispense Refill  . aspirin (ASPIRIN CHILDRENS) 81 MG chewable tablet Chew 1 tablet (81 mg total) by mouth daily. 36 tablet 11  . carvedilol (COREG) 12.5 MG tablet Take 1 tablet (12.5 mg total) by mouth 2 (two) times daily with a meal. Take one and a half (1 1/2) tablets (18.75 mg) by mouth twice daily. (Patient taking differently: Take 18.75 mg by mouth 2 (two) times daily with a meal. ) 180 tablet 3  . clopidogrel (PLAVIX) 75 MG tablet Take 1 tablet (75 mg total) by mouth daily with breakfast. 30 tablet 11  . diphenhydramine-acetaminophen (TYLENOL PM) 25-500 MG TABS tablet Take 2 tablets by mouth at bedtime.    [name] FIBER PO Take 1 capsule by mouth 2 (two) times daily.    . mirabegron ER (MYRBETRIQ) 50 MG TB24 tablet Take 50 mg by mouth daily.    . Multiple Vitamins-Minerals (ONE-A-DAY MENS 50+ ADVANTAGE PO) Take 1 tablet by mouth daily.     [name] PARoxetine (PAXIL) 40 MG tablet Take 40 mg by mouth daily.    . rosuvastatin (CRESTOR) 40 MG tablet Take 1 tablet (40 mg total) by mouth daily. 90 tablet 3  . sacubitril-valsartan (ENTRESTO) 49-51 MG Take 1 tablet by mouth 2 (two) times daily. (Patient taking differently: Take 1 tablet by mouth 2 (two) times daily. Pt takes 2 tabs in the morning 1 tab at night) 60 tablet 11  . venlafaxine XR (EFFEXOR-XR) 75 MG 24 hr capsule Take 75 mg by mouth daily.     No current facility-administered medications for this visit.     Allergies:   Sulfa antibiotics    Social History:  The patient  reports that he has been smoking cigars. He has a 48.00 pack-year smoking history. He has never used smokeless tobacco. He reports current alcohol use of about 7.0 standard drinks of alcohol per week. He reports that he does not use drugs.   Family History:  The patient's family history includes Cancer in his mother.    ROS:  Please see the  history of present illness.   Otherwise, review of systems are positive for none.   All other systems are reviewed and negative.    PHYSICAL EXAM: VS:  BP (!) 143/82   Pulse 63   Temp (!) 97.2 F (36.2 C)   Ht 5\' 11"  (1.803 m)   Wt 151 lb 3.2 oz (68.6 kg)   SpO2 97%   BMI 21.09 kg/m  , BMI Body mass index is 21.09 kg/m. GEN: Well nourished, well developed, in no acute distress  HEENT: normal  Neck: no JVD, carotid bruits, or masses Cardiac: RRR; no murmurs, rubs, or gallops,no edema  Respiratory:  clear to auscultation bilaterally, normal work of breathing GI: soft, nontender, nondistended, + BS MS: no deformity or atrophy  Skin: warm and dry, no rash Neuro:  Strength and sensation are  intact Psych: euthymic mood, full affect Vascular: Femoral pulses +1 bilaterally.  EKG:  EKG is not ordered today.   Recent Labs: 07/14/2019: BUN 16; Creatinine, Ser 0.95; Hemoglobin 14.9; Platelets 194; Potassium 4.2; Sodium 142    Lipid Panel No results found for: CHOL, TRIG, HDL, CHOLHDL, VLDL, LDLCALC, LDLDIRECT    Wt Readings from Last 3 Encounters:  09/01/19 151 lb 3.2 oz (68.6 kg)  08/27/19 149 lb (67.6 kg)  07/29/19 145 lb (65.8 kg)       No flowsheet data found.    ASSESSMENT AND PLAN:   1.  Peripheral arterial disease: Status post recent drug-coated balloon angioplasty to the left common and external iliac arteries with excellent results.  Postprocedure ABI improved to normal.  Repeat studies in 6 months.  He has moderate iliac and common femoral disease on the right side with known occluded popliteal artery.  He does have right calf claudication but is not lifestyle limiting at the present time.  Continue to monitor closely.   2. Tobacco use: I again discussed with him the importance of smoking cessation but he reports inability to quit.  3. Hyperlipidemia: The patient was switched to high-dose rosuvastatin with improvement in LDL to 93.  4. Coronary artery  disease: No anginal symptoms.   5. Chronic systolic heart failure: His symptoms improved with addition of Entresto.  Continue carvedilol.  No evidence of volume overload.  The patient was recently referred to the heart failure clinic by Dr. Tamala Julian and to EP for ICD placement.  6.  Essential hypertension: Blood pressure is reasonably controlled on current medications    Disposition:   FU with me in 6 months  Signed,  Kathlyn Sacramento, MD  09/01/2019 8:15 AM    [place]

## 2019-09-09 ENCOUNTER — Telehealth: Payer: Self-pay | Admitting: Cardiovascular Disease

## 2019-09-09 ENCOUNTER — Other Ambulatory Visit: Payer: Self-pay | Admitting: Interventional Cardiology

## 2019-09-09 MED ORDER — CARVEDILOL 12.5 MG PO TABS: 12.5000 mg | ORAL_TABLET | Freq: Two times a day (BID) | ORAL | 3 refills | Status: DC

## 2019-09-09 NOTE — Telephone Encounter (Signed)
Yes continue Aspirin.

## 2019-09-09 NOTE — Telephone Encounter (Signed)
Will verify with MD that patient should continue aspirin.

## 2019-09-09 NOTE — Telephone Encounter (Signed)
New Message   Pt c/o medication issue:  1. Name of Medication: aspirin (ASPIRIN CHILDRENS) 81 MG chewable tablet   2. How are you currently taking this medication (dosage and times per day)?   3. Are you having a reaction (difficulty breathing--STAT)?   4. What is your medication issue? Patient states that he was prescribed a low dose aspirin prior to his procedure. He is wanting to know does he need to still continue to take the medication.

## 2019-09-10 NOTE — Telephone Encounter (Signed)
The patient has been made aware and verbalized his understanding.  

## 2019-09-15 ENCOUNTER — Telehealth: Payer: Self-pay | Admitting: Interventional Cardiology

## 2019-09-15 DIAGNOSIS — I5042 Chronic combined systolic (congestive) and diastolic (congestive) heart failure: Secondary | ICD-10-CM

## 2019-09-15 DIAGNOSIS — I2510 Atherosclerotic heart disease of native coronary artery without angina pectoris: Secondary | ICD-10-CM

## 2019-09-15 NOTE — Telephone Encounter (Signed)
Appointment for lab work for 09/17/19 (BMET) cancelled. New order put in for BMET to be done at [facility] and released. Pt notified.  He also was to be sending BP readings to Dr. Tamala Julian but having trouble through [place].   He is taking Entresto 24/26 mg - two tablets twice a day and is tolerating. His BPs are averaging 130-150s/70s-80s. HR 60s. He has about 13 days left of the 24/26 mg dose and will need a new prescription called in if he going to maintain this dose. Adv his new prescription will be for 49/51 mg - one tablet twice a day.  Will route to Dr. Tamala Julian and his nurse to confirm this plan, and then new dose will be sent to [facility].

## 2019-09-15 NOTE — Telephone Encounter (Signed)
Patient is to have lab work done 09/17/19. He is requesting that an order be sent to the [facility] in [place] being that is closer to home.   Fax number: [phone]

## 2019-09-17 ENCOUNTER — Other Ambulatory Visit: Payer: Medicare Other

## 2019-09-17 DIAGNOSIS — I2510 Atherosclerotic heart disease of native coronary artery without angina pectoris: Secondary | ICD-10-CM | POA: Diagnosis not present

## 2019-09-17 LAB — BASIC METABOLIC PANEL
BUN/Creatinine Ratio: 12 (ref 10–24)
BUN: 13 mg/dL (ref 8–27)
CO2: 26 mmol/L (ref 20–29)
Calcium: 9.3 mg/dL (ref 8.6–10.2)
Chloride: 101 mmol/L (ref 96–106)
Creatinine, Ser: 1.07 mg/dL (ref 0.76–1.27)
GFR calc Af Amer: 77 mL/min/{1.73_m2} (ref >59)
GFR calc non Af Amer: 67 mL/min/{1.73_m2} (ref >59)
Glucose: 98 mg/dL (ref 65–99)
Potassium: 4.4 mmol/L (ref 3.5–5.2)
Sodium: 141 mmol/L (ref 134–144)

## 2019-09-17 MED ORDER — ENTRESTO 49-51 MG PO TABS: 1.0000 | ORAL_TABLET | Freq: Two times a day (BID) | ORAL | 11 refills | Status: DC

## 2019-09-17 MED ORDER — SPIRONOLACTONE 25 MG PO TABS: 12.5000 mg | ORAL_TABLET | Freq: Every day | ORAL | 3 refills | Status: DC

## 2019-09-17 NOTE — Telephone Encounter (Signed)
The blood pressures are too high.  Please add Aldactone 12.5 mg/day and get a be met in 7 days.

## 2019-09-17 NOTE — Telephone Encounter (Signed)
[name], MD      The blood pressures are too high.  Please add Aldactone 12.5 mg/day and get a be met in 7 days.     Spoke with pt and went over recommendations.  Pt verbalized understanding and was in agreement with plan.  Pt will have BMET drawn when he is here to see Dr. Lovena Le next week.  He is aware to see Dr. Lovena Le first in case he needs other labs as well.

## 2019-09-23 ENCOUNTER — Other Ambulatory Visit: Payer: Medicare Other | Admitting: *Deleted

## 2019-09-23 ENCOUNTER — Other Ambulatory Visit: Payer: Self-pay

## 2019-09-23 ENCOUNTER — Ambulatory Visit (INDEPENDENT_AMBULATORY_CARE_PROVIDER_SITE_OTHER): Payer: Medicare Other | Admitting: Internal Medicine

## 2019-09-23 ENCOUNTER — Encounter: Payer: Self-pay | Admitting: Internal Medicine

## 2019-09-23 VITALS — BP 110/74 | HR 70 | Ht 70.0 in | Wt 150.8 lb

## 2019-09-23 DIAGNOSIS — I5042 Chronic combined systolic (congestive) and diastolic (congestive) heart failure: Secondary | ICD-10-CM | POA: Diagnosis not present

## 2019-09-23 DIAGNOSIS — I2510 Atherosclerotic heart disease of native coronary artery without angina pectoris: Secondary | ICD-10-CM

## 2019-09-23 LAB — BASIC METABOLIC PANEL
BUN/Creatinine Ratio: 14 (ref 10–24)
BUN: 14 mg/dL (ref 8–27)
CO2: 25 mmol/L (ref 20–29)
Calcium: 9.2 mg/dL (ref 8.6–10.2)
Chloride: 102 mmol/L (ref 96–106)
Creatinine, Ser: 0.99 mg/dL (ref 0.76–1.27)
GFR calc Af Amer: 85 mL/min/{1.73_m2} (ref >59)
GFR calc non Af Amer: 73 mL/min/{1.73_m2} (ref >59)
Glucose: 107 mg/dL — ABNORMAL HIGH (ref 65–99)
Potassium: 4 mmol/L (ref 3.5–5.2)
Sodium: 141 mmol/L (ref 134–144)

## 2019-09-23 NOTE — Patient Instructions (Signed)
Medication Instructions:  Your physician recommends that you continue on your current medications as directed. Please refer to the Current Medication list given to you today.  Labwork: None ordered.  Testing/Procedures: None ordered.  Follow-Up: Your physician wants you to follow-up in: as needed with Dr. Taylor.      Any Other Special Instructions Will Be Listed Below (If Applicable).  If you need a refill on your cardiac medications before your next appointment, please call your pharmacy.   

## 2019-09-23 NOTE — Progress Notes (Signed)
HPI Mr. Yebra is referred today by Dr. Tamala Julian to consider ICD insertion. He has a long standing h/o a non-ischemic CM, s/p ICD insertion, initially in 2004 and then around 2009. He has 2 broken ICD leads left in place. His EF improved but has recently been found to have gone back down. In the interim he has developed peripheral vascular disesase. He is walking. He has never had a treated malignant ventricular arrhythmia. He has class 1-2 symptoms. No syncope.  Allergies  Allergen Reactions  . Sulfa Antibiotics Other (See Comments)    Unknown reaction - childhood allergy     Current Outpatient Medications  Medication Sig Dispense Refill  . aspirin (ASPIRIN CHILDRENS) 81 MG chewable tablet Chew 1 tablet (81 mg total) by mouth daily. 36 tablet 11  . carvedilol (COREG) 12.5 MG tablet Take 1 tablet (12.5 mg total) by mouth 2 (two) times daily with a meal. 180 tablet 3  . clopidogrel (PLAVIX) 75 MG tablet Take 1 tablet (75 mg total) by mouth daily with breakfast. 30 tablet 11  . diphenhydramine-acetaminophen (TYLENOL PM) 25-500 MG TABS tablet Take 2 tablets by mouth at bedtime.    [name] FIBER PO Take 1 capsule by mouth 2 (two) times daily.    . mirabegron ER (MYRBETRIQ) 50 MG TB24 tablet Take 50 mg by mouth daily.    . Multiple Vitamins-Minerals (ONE-A-DAY MENS 50+ ADVANTAGE PO) Take 1 tablet by mouth daily.     [name] PARoxetine (PAXIL) 40 MG tablet Take 40 mg by mouth daily.    . rosuvastatin (CRESTOR) 40 MG tablet Take 1 tablet (40 mg total) by mouth daily. 90 tablet 3  . sacubitril-valsartan (ENTRESTO) 49-51 MG Take 1 tablet by mouth 2 (two) times daily. 60 tablet 11  . spironolactone (ALDACTONE) 25 MG tablet Take 0.5 tablets (12.5 mg total) by mouth daily. 45 tablet 3  . venlafaxine XR (EFFEXOR-XR) 75 MG 24 hr capsule Take 75 mg by mouth daily.     No current facility-administered medications for this visit.      Past Medical History:  Diagnosis Date  . Abdominal distention   .  Abdominal pain   . Bronchitis    hx of  . Cancer ([place])    skin  . CHF (congestive heart failure) (HCC)     (hx of EF 27%) s/p AICD 2004. Most recent LVEF > 40% , 8/11  . Chronic kidney disease    has small stones, no treatment  . Cough   . Depression    takes paxil  . Diverticulosis    hx of  . Dysrhythmia   . Easy bruising   . Hematuria    hx of  . Hernia october 2012   [facility]  . HTN (hypertension)   . Hyperlipidemia    takes zocor  . Inguinal hernia   . Left groin pain     ROS:   All systems reviewed and negative except as noted in the HPI.   Past Surgical History:  Procedure Laterality Date  . ABDOMINAL AORTOGRAM W/LOWER EXTREMITY N/A 09/03/2018   Procedure: ABDOMINAL AORTOGRAM W/LOWER EXTREMITY;  Surgeon: Wellington Hampshire, MD;  Location: [place] [facility];  Service: Cardiovascular;  Laterality: N/A;  . ABDOMINAL AORTOGRAM W/LOWER EXTREMITY N/A 07/29/2019   Procedure: ABDOMINAL AORTOGRAM W/LOWER EXTREMITY;  Surgeon: Wellington Hampshire, MD;  Location: [place] [facility];  Service: Cardiovascular;  Laterality: N/A;  . APPENDECTOMY    . CARDIAC CATHETERIZATION    .  CARDIAC DEFIBRILLATOR REMOVAL  2005  . HERNIA REPAIR  09/11/11   repair of LIH   . INGUINAL HERNIA REPAIR  09/11/2011   Procedure: HERNIA REPAIR INGUINAL ADULT;  Surgeon: Judieth Keens, DO;  Location: [place];  Service: General;  Laterality: Left;  open left inguinal hernia with mesh  . LOWER EXTREMITY ANGIOGRAM N/A 02/05/2013   Procedure: LOWER EXTREMITY ANGIOGRAM;  Surgeon: Jettie Booze, MD;  Location: [facility];  Service: Cardiovascular;  Laterality: N/A;  . PERIPHERAL VASCULAR BALLOON ANGIOPLASTY  07/29/2019   Procedure: PERIPHERAL VASCULAR BALLOON ANGIOPLASTY;  Surgeon: Wellington Hampshire, MD;  Location: [place] [facility];  Service: Cardiovascular;;  . PERIPHERAL VASCULAR INTERVENTION Left 09/03/2018   Procedure: PERIPHERAL VASCULAR INTERVENTION;  Surgeon: Wellington Hampshire, MD;  Location: [place] [facility];  Service: Cardiovascular;  Laterality: Left;     Family History  Problem Relation Age of Onset  . Cancer Mother      Social History   Socioeconomic History  . Marital status: Married    Spouse name: Not on file  . Number of children: Not on file  . Years of education: Not on file  . Highest education level: Not on file  Occupational History  . Not on file  Social Needs  . Financial resource strain: Not on file  . Food insecurity    Worry: Not on file    Inability: Not on file  . Transportation needs    Medical: Not on file    Non-medical: Not on file  Tobacco Use  . Smoking status: Current Every Day Smoker    Packs/day: 1.00    Years: 48.00    Pack years: 48.00    Types: Cigars  . Smokeless tobacco: Never Used  Substance and Sexual Activity  . Alcohol use: Yes    Alcohol/week: 7.0 standard drinks    Types: 7 drink(s) per week  . Drug use: No  . Sexual activity: Not on file  Lifestyle  . Physical activity    Days per week: Not on file    Minutes per session: Not on file  . Stress: Not on file  Relationships  . Social Herbalist on phone: Not on file    Gets together: Not on file    Attends religious service: Not on file    Active member of club or organization: Not on file    Attends meetings of clubs or organizations: Not on file    Relationship status: Not on file  . Intimate partner violence    Fear of current or ex partner: Not on file    Emotionally abused: Not on file    Physically abused: Not on file    Forced sexual activity: Not on file  Other Topics Concern  . Not on file  Social History Narrative  . Not on file     BP 110/74   Pulse 70   Ht 5\' 10"  (1.778 m)   Wt 150 lb 12.8 oz (68.4 kg)   SpO2 97%   BMI 21.64 kg/m   Physical Exam:  Well appearing NAD HEENT: Unremarkable Neck:  No JVD, no thyromegally Lymphatics:  No adenopathy Back:  No CVA tenderness Lungs:  Clear HEART:  Regular rate rhythm, no  murmurs, no rubs, no clicks Abd:  soft, positive bowel sounds, no organomegally, no rebound, no guarding Ext:  2 plus pulses, no edema, no cyanosis, no clubbing Skin:  No rashes no nodules Neuro:  CN II through XII intact, motor grossly intact  EKG - reviewed  DEVICE  Non-active  Assess/Plan: 1. Non-ischemic CM - I have reviewed the treatment options in detail. I think removing his 2 ICD leads would be quite risky and the risk/benefit ration would be unacceptable for device extraction. We did discuss a subcutaneous ICD but I will need to do a little research as to whether electrical shorting would be problematic. We have no data to tell us that primary prevention ICD's in the over 80 age with non-ischemic CM has any significant advantage. Placing a new intravascular ICD is contra-indicated.  2. clauidication - his symptoms have improved. I have encouraged him to increase his physical activity.  Mikle Bosworth.D.

## 2019-11-10 ENCOUNTER — Ambulatory Visit: Payer: Medicare Other | Attending: Internal Medicine

## 2019-11-10 DIAGNOSIS — Z23 Encounter for immunization: Secondary | ICD-10-CM | POA: Insufficient documentation

## 2019-11-10 NOTE — Progress Notes (Signed)
   Covid-19 Vaccination Clinic  Name:  Nicholas Herrera    MRN: [id number] DOB: Feb 26, 1941  11/10/2019  Mr. Nicholas Herrera was observed post Covid-19 immunization for 30 minutes based on pre-vaccination screening without incidence. He was provided with Vaccine Information Sheet and instruction to access the V-Safe system.   Mr. Nicholas Herrera was instructed to call 911 with any severe reactions post vaccine: [name] Difficulty breathing  . Swelling of your face and throat  . A fast heartbeat  . A bad rash all over your body  . Dizziness and weakness    Immunizations Administered    Name Date Dose VIS Date Route   Pfizer COVID-19 Vaccine 11/10/2019 11:37 AM 0.3 mL 10/09/2019 Intramuscular   Manufacturer: [place]   Lot: [id number]   [place]: [id number]

## 2019-11-13 ENCOUNTER — Ambulatory Visit: Payer: Medicare Other

## 2019-11-18 DIAGNOSIS — H26491 Other secondary cataract, right eye: Secondary | ICD-10-CM | POA: Diagnosis not present

## 2019-11-20 ENCOUNTER — Ambulatory Visit (HOSPITAL_COMMUNITY)
Admission: RE | Admit: 2019-11-20 | Discharge: 2019-11-20 | Disposition: A | Discharge status: Home / Self Care | Payer: Medicare Other | Source: Ambulatory Visit | Attending: Cardiology | Admitting: Cardiology

## 2019-11-20 ENCOUNTER — Encounter (HOSPITAL_COMMUNITY): Payer: Self-pay | Admitting: Cardiology

## 2019-11-20 ENCOUNTER — Other Ambulatory Visit: Payer: Self-pay

## 2019-11-20 ENCOUNTER — Telehealth (HOSPITAL_COMMUNITY): Payer: Self-pay

## 2019-11-20 ENCOUNTER — Other Ambulatory Visit (HOSPITAL_COMMUNITY): Payer: Self-pay

## 2019-11-20 VITALS — BP 144/77 | HR 72 | Wt 148.8 lb

## 2019-11-20 DIAGNOSIS — I429 Cardiomyopathy, unspecified: Secondary | ICD-10-CM | POA: Insufficient documentation

## 2019-11-20 DIAGNOSIS — I5022 Chronic systolic (congestive) heart failure: Secondary | ICD-10-CM | POA: Insufficient documentation

## 2019-11-20 DIAGNOSIS — Z7982 Long term (current) use of aspirin: Secondary | ICD-10-CM | POA: Diagnosis not present

## 2019-11-20 DIAGNOSIS — I5042 Chronic combined systolic (congestive) and diastolic (congestive) heart failure: Secondary | ICD-10-CM

## 2019-11-20 DIAGNOSIS — Z79899 Other long term (current) drug therapy: Secondary | ICD-10-CM | POA: Insufficient documentation

## 2019-11-20 DIAGNOSIS — I130 Hypertensive heart and chronic kidney disease with heart failure and stage 1 through stage 4 chronic kidney disease, or unspecified chronic kidney disease: Secondary | ICD-10-CM | POA: Insufficient documentation

## 2019-11-20 DIAGNOSIS — Z7952 Long term (current) use of systemic steroids: Secondary | ICD-10-CM | POA: Diagnosis not present

## 2019-11-20 DIAGNOSIS — N183 Chronic kidney disease, stage 3 unspecified: Secondary | ICD-10-CM | POA: Insufficient documentation

## 2019-11-20 DIAGNOSIS — I2510 Atherosclerotic heart disease of native coronary artery without angina pectoris: Secondary | ICD-10-CM | POA: Diagnosis not present

## 2019-11-20 DIAGNOSIS — E785 Hyperlipidemia, unspecified: Secondary | ICD-10-CM | POA: Insufficient documentation

## 2019-11-20 DIAGNOSIS — Z7289 Other problems related to lifestyle: Secondary | ICD-10-CM | POA: Insufficient documentation

## 2019-11-20 DIAGNOSIS — F17210 Nicotine dependence, cigarettes, uncomplicated: Secondary | ICD-10-CM | POA: Diagnosis not present

## 2019-11-20 DIAGNOSIS — Z95810 Presence of automatic (implantable) cardiac defibrillator: Secondary | ICD-10-CM | POA: Insufficient documentation

## 2019-11-20 DIAGNOSIS — R9431 Abnormal electrocardiogram [ECG] [EKG]: Secondary | ICD-10-CM | POA: Insufficient documentation

## 2019-11-20 DIAGNOSIS — I739 Peripheral vascular disease, unspecified: Secondary | ICD-10-CM | POA: Diagnosis not present

## 2019-11-20 DIAGNOSIS — Z7902 Long term (current) use of antithrombotics/antiplatelets: Secondary | ICD-10-CM | POA: Diagnosis not present

## 2019-11-20 DIAGNOSIS — Z809 Family history of malignant neoplasm, unspecified: Secondary | ICD-10-CM | POA: Insufficient documentation

## 2019-11-20 LAB — CBC
HCT: 44.9 % (ref 39.0–52.0)
Hemoglobin: 15.1 g/dL (ref 13.0–17.0)
MCH: 32.1 pg (ref 26.0–34.0)
MCHC: 33.6 g/dL (ref 30.0–36.0)
MCV: 95.5 fL (ref 80.0–100.0)
Platelets: 213 10*3/uL (ref 150–400)
RBC: 4.7 MIL/uL (ref 4.22–5.81)
RDW: 12.5 % (ref 11.5–15.5)
WBC: 8.6 10*3/uL (ref 4.0–10.5)
nRBC: 0 % (ref 0.0–0.2)

## 2019-11-20 LAB — BASIC METABOLIC PANEL
Anion gap: 10 (ref 5–15)
BUN: 15 mg/dL (ref 8–23)
CO2: 25 mmol/L (ref 22–32)
Calcium: 9 mg/dL (ref 8.9–10.3)
Chloride: 106 mmol/L (ref 98–111)
Creatinine, Ser: 1.07 mg/dL (ref 0.61–1.24)
GFR calc Af Amer: >60 mL/min (ref >60)
GFR calc non Af Amer: >60 mL/min (ref >60)
Glucose, Bld: 104 mg/dL — ABNORMAL HIGH (ref 70–99)
Potassium: 4.5 mmol/L (ref 3.5–5.1)
Sodium: 141 mmol/L (ref 135–145)

## 2019-11-20 LAB — LIPID PANEL
Cholesterol: 177 mg/dL (ref 0–200)
HDL: 60 mg/dL (ref >40)
LDL Cholesterol: 89 mg/dL (ref 0–99)
Total CHOL/HDL Ratio: 3 RATIO
Triglycerides: 142 mg/dL (ref <150)
VLDL: 28 mg/dL (ref 0–40)

## 2019-11-20 MED ORDER — SPIRONOLACTONE 25 MG PO TABS: 25.0000 mg | ORAL_TABLET | Freq: Every day | ORAL | 3 refills | Status: DC

## 2019-11-20 MED ORDER — SODIUM CHLORIDE 0.9% FLUSH: 3.0000 mL | Freq: Two times a day (BID) | INTRAVENOUS | Status: DC

## 2019-11-20 MED ORDER — SACUBITRIL-VALSARTAN 97-103 MG PO TABS: 1.0000 | ORAL_TABLET | Freq: Two times a day (BID) | ORAL | 5 refills | Status: DC

## 2019-11-20 NOTE — Telephone Encounter (Signed)
-----   Message from Larey Dresser, MD sent at 11/20/2019  4:11 PM EST ----- LDL is above goal (<70).  If he is taking Crestor 40 mg daily, would refer to lipid clinic for [place].

## 2019-11-20 NOTE — Patient Instructions (Addendum)
INCREASE Entresto to 97/103mg  (1 tab) twice a day  INCREASE Spironolactone to 25mg  (1 tab) daily  Labs today and repeat in 10 days We will only contact you if something comes back abnormal or we need to make some changes. Otherwise no news is good news!  Your physician recommends that you schedule a follow-up appointment in: 1 month with Dr Aundra Dubin  Please call office at [phone] option 2 if you have any questions or concerns.   Will schedule right and left heart catheterization.

## 2019-11-22 NOTE — Progress Notes (Signed)
PCP: Jonathon Jordan, MD Cardiology: Dr. Tamala Julian HF Cardiology: Dr. Aundra Dubin  79 yo with PAD, smoking, and a long history of cardiomyopathy was referred by Dr. Tamala Julian for evaluation of CHF/cardiomyopathy.  Patient was diagnosed with a cardiomyopathy prior to 2004 while he was living in [place].  He had an ICD placed in 2004.  Per notes, EF was < 30% during this time.  He thinks he may have had a heart catheterization but does not remember the details.  The ICD subsequently malfunctioned and was turned off in 2009.  It is still implanted and has 2 nonfunctioning leads.  Echo in 2011 after moving the [facility] showed improved EF at 40-45%.  He did not have an echo over the next 9 years.  However, Echo done in 7/20 showed EF down to 20-25% with a moderately dilated LV.  He has been subsequently started on HF meds.  He saw Dr. Lovena Le to decide about explantation of old ICD/implantation of new ICD.  He was thought to be not a candidate for a new intravascular ICD.  Subcutaneous ICD would be a potential option but utility would like be limited if he has a nonischemic cardiomyopathy at age 28.   Patient also has an extensive PAD history.  He still smokes 1/2-1 ppd.  His last peripheral vascular intervention was in 9/20 with PTCA to left external and common iliac arteries.  Currently, he gets right calf claudication after walking longer distances.    Symptomatically, he is doing reasonably well.  Of note, he denies chest pain.   He fatigues with walking and has some trouble with balance.  No significant dyspnea, however.  No orthopnea/PND.  He has tolerated medication titration so far and BP is actually mildly elevated today.   ECG (personally reviewed): NSR, nonspecific slight anterolateral and inferior ST depession  Labs (2/20): LDL 93 Labs (11/20): K 4, creatinine 0.99   PMH:  1. HTN 2. Hyperlipidemia 3. Active smoker 4. PAD: Right popliteal stent in 2014 with subsequent occlusion.   - Orbital atherectomy  + drug coated balloon angioplasty of left SFA in 2019.   - In 9/20, had PTCA left external and common iliac arteries.  5. CKD: Stage 3.  6. Cardiomyopathy: Patient had an ICD placed in [place] in 2004, so presumably had a pre-dating cardiomyopathy.  He thinks that he had a heart cath in [place] but is not sure.  EF <30% when he moved to [place].  - Echo (2011) with EF improved to 40-45%.  - Echo (7/20): EF 20-25%, moderate LV dilation, normal RV size and systolic function.  - Patient has 2 nonfunctioning ICD leads (ICD is not functional, turned off in 2009).   SH: Retired 15 years ago from [employer].  Married with 3 children, smokes about 1/2-1 ppd still, has had about 3 drinks/day for 2-3 years.   FH: Mother with cancer, father with COPD.  No heart disease that he knows of.   ROS: All systems reviewed and negative except as per HPI.   Current Outpatient Medications  Medication Sig Dispense Refill  . aspirin (ASPIRIN CHILDRENS) 81 MG chewable tablet Chew 1 tablet (81 mg total) by mouth daily. 36 tablet 11  . carvedilol (COREG) 12.5 MG tablet Take 1 tablet (12.5 mg total) by mouth 2 (two) times daily with a meal. 180 tablet 3  . clopidogrel (PLAVIX) 75 MG tablet Take 1 tablet (75 mg total) by mouth daily with breakfast. 30 tablet 11  . diphenhydramine-acetaminophen (TYLENOL PM) 25-500  MG TABS tablet Take 2 tablets by mouth at bedtime.    [name] FIBER PO Take 1 capsule by mouth 2 (two) times daily.    . mirabegron ER (MYRBETRIQ) 50 MG TB24 tablet Take 50 mg by mouth daily.    . Multiple Vitamins-Minerals (ONE-A-DAY MENS 50+ ADVANTAGE PO) Take 1 tablet by mouth daily.     [name] PARoxetine (PAXIL) 40 MG tablet Take 40 mg by mouth daily.    . prednisoLONE acetate (PRED FORTE) 1 % ophthalmic suspension 1 DROP IN LEFT EYE TWICE A DAY USE FOR 5 DAYS AFTER LASER PROCEDURE    . rosuvastatin (CRESTOR) 40 MG tablet Take 1 tablet (40 mg total) by mouth daily. 90 tablet 3  . spironolactone (ALDACTONE)  25 MG tablet Take 1 tablet (25 mg total) by mouth daily. 90 tablet 3  . venlafaxine XR (EFFEXOR-XR) 75 MG 24 hr capsule Take 75 mg by mouth daily.    . sacubitril-valsartan (ENTRESTO) 97-103 MG Take 1 tablet by mouth 2 (two) times daily. 60 tablet 5   No current facility-administered medications for this encounter.   BP (!) 144/77   Pulse 72   Wt 67.5 kg (148 lb 12.8 oz)   SpO2 96%   BMI 21.35 kg/m  General: NAD Neck: No JVD, no thyromegaly or thyroid nodule.  Lungs: Clear to auscultation bilaterally with normal respiratory effort. CV: Nondisplaced PMI.  Heart regular S1/S2, no S3/S4, no murmur.  No peripheral edema.  No carotid bruit.  Unable to palpate pedal pulses.  Abdomen: Soft, nontender, no hepatosplenomegaly, no distention.  Skin: Intact without lesions or rashes.  Neurologic: Alert and oriented x 3.  Psych: Normal affect. Extremities: No clubbing or cyanosis.  HEENT: Normal.   Assessment/Plan: 1. Chronic systolic CHF:  Patient has had a known cardiomyopathy since prior to 2004.  EF had improved to 40-45% in 2011 but had dropped significantly on echo in 7/20 with EF 20-25%. He has a nonfunctioning ICD.  Cause of the cardiomyopathy is uncertain.  He thinks he may have had a heart catheterization when cardiomyopathy was initially found (would be surprised if he had not), so presumably at least initially this was a nonischemic cardiomyopathy given no known intervention.  He has no family history of cardiomyopathy.  While he drinks about 3 alcoholic drinks/day currently, he did not drink regularly prior to 2-3 years ago so I do not think that ETOH explains his cardiomyopathy. I am concerned given the improvement then fall in his EF.  With ongoing smoking and known extensive PAD, even in the absence of chest pain I think we need to rule out coronary disease as a cause of at least the worsening of his LV function.  I will need to look into whether he could get a cardiac MRI.  He has an ICD  but it is nonfunctional. NYHA class II symptoms, he is not volume overloaded on exam.  - I asked him to cut back on ETOH to 1 drink or less per day.  - Increase Entresto to 97/103 bid with BMET today and in 10 days.   - Increase spironolactone to 25 mg daily.  - Continue Coreg 12.5 mg bid.  - He does not appear to need Lasix.  - I do think that we need to exclude CAD as cause of EF worsening given his RFs.  I will arrange for right/left heart cath.  I discussed risks/benefits with patient and he agrees to proceed.   - Patient is not a  candidate for another intravascular ICD.  Probably limited utility to implanting subcutaneous ICD if he has a nonischemic cardiomyopathy.  If cath shows significant coronary disease, can revisit ICD issue.  2. PAD: Stable mild right calf claudication.  Followed by Dr. Fletcher Anon.  - Continue to walk through pain at least a short distance.  - Continue ASA 81 and Crestor 40 mg daily.  - Needs to quit smoking => discussed with patient today but think he is not ready.  3. HTN: Increasing Entresto and spironolactone today.  4. Hyperlipidemia: LDL was 93 in 2/20.  With PAD, would like to see LDL < 70.  He is on Crestor 40 mg daily.  - Check lipids, would refer to lipid clinic for [place] if LDL is not at goal.   Loralie Champagne 11/22/2019

## 2019-11-22 NOTE — H&P (View-Only) (Signed)
PCP: Jonathon Jordan, MD Cardiology: Dr. Tamala Julian HF Cardiology: Dr. Aundra Dubin  79 yo with PAD, smoking, and a long history of cardiomyopathy was referred by Dr. Tamala Julian for evaluation of CHF/cardiomyopathy.  Patient was diagnosed with a cardiomyopathy prior to 2004 while he was living in [place].  He had an ICD placed in 2004.  Per notes, EF was < 30% during this time.  He thinks he may have had a heart catheterization but does not remember the details.  The ICD subsequently malfunctioned and was turned off in 2009.  It is still implanted and has 2 nonfunctioning leads.  Echo in 2011 after moving the [facility] showed improved EF at 40-45%.  He did not have an echo over the next 9 years.  However, Echo done in 7/20 showed EF down to 20-25% with a moderately dilated LV.  He has been subsequently started on HF meds.  He saw Dr. Lovena Le to decide about explantation of old ICD/implantation of new ICD.  He was thought to be not a candidate for a new intravascular ICD.  Subcutaneous ICD would be a potential option but utility would like be limited if he has a nonischemic cardiomyopathy at age 63.   Patient also has an extensive PAD history.  He still smokes 1/2-1 ppd.  His last peripheral vascular intervention was in 9/20 with PTCA to left external and common iliac arteries.  Currently, he gets right calf claudication after walking longer distances.    Symptomatically, he is doing reasonably well.  Of note, he denies chest pain.   He fatigues with walking and has some trouble with balance.  No significant dyspnea, however.  No orthopnea/PND.  He has tolerated medication titration so far and BP is actually mildly elevated today.   ECG (personally reviewed): NSR, nonspecific slight anterolateral and inferior ST depession  Labs (2/20): LDL 93 Labs (11/20): K 4, creatinine 0.99   PMH:  1. HTN 2. Hyperlipidemia 3. Active smoker 4. PAD: Right popliteal stent in 2014 with subsequent occlusion.   - Orbital atherectomy  + drug coated balloon angioplasty of left SFA in 2019.   - In 9/20, had PTCA left external and common iliac arteries.  5. CKD: Stage 3.  6. Cardiomyopathy: Patient had an ICD placed in [place] in 2004, so presumably had a pre-dating cardiomyopathy.  He thinks that he had a heart cath in [place] but is not sure.  EF <30% when he moved to [place].  - Echo (2011) with EF improved to 40-45%.  - Echo (7/20): EF 20-25%, moderate LV dilation, normal RV size and systolic function.  - Patient has 2 nonfunctioning ICD leads (ICD is not functional, turned off in 2009).   SH: Retired 15 years ago from [employer].  Married with 3 children, smokes about 1/2-1 ppd still, has had about 3 drinks/day for 2-3 years.   FH: Mother with cancer, father with COPD.  No heart disease that he knows of.   ROS: All systems reviewed and negative except as per HPI.   Current Outpatient Medications  Medication Sig Dispense Refill  . aspirin (ASPIRIN CHILDRENS) 81 MG chewable tablet Chew 1 tablet (81 mg total) by mouth daily. 36 tablet 11  . carvedilol (COREG) 12.5 MG tablet Take 1 tablet (12.5 mg total) by mouth 2 (two) times daily with a meal. 180 tablet 3  . clopidogrel (PLAVIX) 75 MG tablet Take 1 tablet (75 mg total) by mouth daily with breakfast. 30 tablet 11  . diphenhydramine-acetaminophen (TYLENOL PM) 25-500  MG TABS tablet Take 2 tablets by mouth at bedtime.    [name] FIBER PO Take 1 capsule by mouth 2 (two) times daily.    . mirabegron ER (MYRBETRIQ) 50 MG TB24 tablet Take 50 mg by mouth daily.    . Multiple Vitamins-Minerals (ONE-A-DAY MENS 50+ ADVANTAGE PO) Take 1 tablet by mouth daily.     [name] PARoxetine (PAXIL) 40 MG tablet Take 40 mg by mouth daily.    . prednisoLONE acetate (PRED FORTE) 1 % ophthalmic suspension 1 DROP IN LEFT EYE TWICE A DAY USE FOR 5 DAYS AFTER LASER PROCEDURE    . rosuvastatin (CRESTOR) 40 MG tablet Take 1 tablet (40 mg total) by mouth daily. 90 tablet 3  . spironolactone (ALDACTONE)  25 MG tablet Take 1 tablet (25 mg total) by mouth daily. 90 tablet 3  . venlafaxine XR (EFFEXOR-XR) 75 MG 24 hr capsule Take 75 mg by mouth daily.    . sacubitril-valsartan (ENTRESTO) 97-103 MG Take 1 tablet by mouth 2 (two) times daily. 60 tablet 5   No current facility-administered medications for this encounter.   BP (!) 144/77   Pulse 72   Wt 67.5 kg (148 lb 12.8 oz)   SpO2 96%   BMI 21.35 kg/m  General: NAD Neck: No JVD, no thyromegaly or thyroid nodule.  Lungs: Clear to auscultation bilaterally with normal respiratory effort. CV: Nondisplaced PMI.  Heart regular S1/S2, no S3/S4, no murmur.  No peripheral edema.  No carotid bruit.  Unable to palpate pedal pulses.  Abdomen: Soft, nontender, no hepatosplenomegaly, no distention.  Skin: Intact without lesions or rashes.  Neurologic: Alert and oriented x 3.  Psych: Normal affect. Extremities: No clubbing or cyanosis.  HEENT: Normal.   Assessment/Plan: 1. Chronic systolic CHF:  Patient has had a known cardiomyopathy since prior to 2004.  EF had improved to 40-45% in 2011 but had dropped significantly on echo in 7/20 with EF 20-25%. He has a nonfunctioning ICD.  Cause of the cardiomyopathy is uncertain.  He thinks he may have had a heart catheterization when cardiomyopathy was initially found (would be surprised if he had not), so presumably at least initially this was a nonischemic cardiomyopathy given no known intervention.  He has no family history of cardiomyopathy.  While he drinks about 3 alcoholic drinks/day currently, he did not drink regularly prior to 2-3 years ago so I do not think that ETOH explains his cardiomyopathy. I am concerned given the improvement then fall in his EF.  With ongoing smoking and known extensive PAD, even in the absence of chest pain I think we need to rule out coronary disease as a cause of at least the worsening of his LV function.  I will need to look into whether he could get a cardiac MRI.  He has an ICD  but it is nonfunctional. NYHA class II symptoms, he is not volume overloaded on exam.  - I asked him to cut back on ETOH to 1 drink or less per day.  - Increase Entresto to 97/103 bid with BMET today and in 10 days.   - Increase spironolactone to 25 mg daily.  - Continue Coreg 12.5 mg bid.  - He does not appear to need Lasix.  - I do think that we need to exclude CAD as cause of EF worsening given his RFs.  I will arrange for right/left heart cath.  I discussed risks/benefits with patient and he agrees to proceed.   - Patient is not a  candidate for another intravascular ICD.  Probably limited utility to implanting subcutaneous ICD if he has a nonischemic cardiomyopathy.  If cath shows significant coronary disease, can revisit ICD issue.  2. PAD: Stable mild right calf claudication.  Followed by Dr. Fletcher Anon.  - Continue to walk through pain at least a short distance.  - Continue ASA 81 and Crestor 40 mg daily.  - Needs to quit smoking => discussed with patient today but think he is not ready.  3. HTN: Increasing Entresto and spironolactone today.  4. Hyperlipidemia: LDL was 93 in 2/20.  With PAD, would like to see LDL < 70.  He is on Crestor 40 mg daily.  - Check lipids, would refer to lipid clinic for [place] if LDL is not at goal.   Loralie Champagne 11/22/2019

## 2019-11-23 NOTE — Addendum Note (Signed)
Encounter addended by: Larey Dresser, MD on: 11/23/2019 8:46 AM  Actions taken: Clinical Note Signed

## 2019-11-24 ENCOUNTER — Encounter: Payer: Self-pay | Admitting: *Deleted

## 2019-11-24 ENCOUNTER — Ambulatory Visit (INDEPENDENT_AMBULATORY_CARE_PROVIDER_SITE_OTHER): Payer: Medicare Other | Admitting: Internal Medicine

## 2019-11-24 ENCOUNTER — Encounter: Payer: Self-pay | Admitting: Internal Medicine

## 2019-11-24 ENCOUNTER — Other Ambulatory Visit: Payer: Self-pay

## 2019-11-24 VITALS — BP 99/68 | HR 81 | Temp 97.0°F | Ht 70.0 in | Wt 148.8 lb

## 2019-11-24 DIAGNOSIS — E785 Hyperlipidemia, unspecified: Secondary | ICD-10-CM

## 2019-11-24 DIAGNOSIS — I5042 Chronic combined systolic (congestive) and diastolic (congestive) heart failure: Secondary | ICD-10-CM | POA: Diagnosis not present

## 2019-11-24 DIAGNOSIS — I739 Peripheral vascular disease, unspecified: Secondary | ICD-10-CM | POA: Diagnosis not present

## 2019-11-24 DIAGNOSIS — I2510 Atherosclerotic heart disease of native coronary artery without angina pectoris: Secondary | ICD-10-CM

## 2019-11-24 MED ORDER — EZETIMIBE 10 MG PO TABS: 10.0000 mg | ORAL_TABLET | Freq: Every day | ORAL | 3 refills | Status: DC

## 2019-11-24 NOTE — Progress Notes (Signed)
LIPID CLINIC CONSULT NOTE  Chief Complaint:  Manage dyslipidemia  Primary Care Physician: Jonathon Jordan, MD  Primary Cardiologist:  Sinclair Grooms, MD  HPI:  Nicholas Herrera is a 79 y.o. male who is being seen today for the evaluation of dyslipidemia at the request of Drs. Aundra Dubin and [place]. This is a pleasant 79 year old male who was living in [place] for a number of years.  He has a history of congestive heart failure with LVEF that recently is in the 20s.  He had an AICD placed in 123XX123 had some complications with the device in the past.  He also has PAD with prior peripheral interventions and is followed by Dr. Fletcher Anon.  He reports longstanding persistent dyslipidemia.  Fortunately he has been able to tolerate statins.  He currently is on rosuvastatin 40 mg daily however his cholesterol remains above target.  Repeat lipid ordered by Dr. Aundra Dubin indicated his total cholesterol was 177, triglycerides 142, HDL 60 and LDL of 89.  His target LDL is less than 70.  PMHx:  Past Medical History:  Diagnosis Date  . Abdominal distention   . Abdominal pain   . Bronchitis    hx of  . Cancer ([place])    skin  . CHF (congestive heart failure) (HCC)     (hx of EF 27%) s/p AICD 2004. Most recent LVEF > 40% , 8/11  . Chronic kidney disease    has small stones, no treatment  . Cough   . Depression    takes paxil  . Diverticulosis    hx of  . Dysrhythmia   . Easy bruising   . Hematuria    hx of  . Hernia october 2012   [facility]  . HTN (hypertension)   . Hyperlipidemia    takes zocor  . Inguinal hernia   . Left groin pain     Past Surgical History:  Procedure Laterality Date  . ABDOMINAL AORTOGRAM W/LOWER EXTREMITY N/A 09/03/2018   Procedure: ABDOMINAL AORTOGRAM W/LOWER EXTREMITY;  Surgeon: Wellington Hampshire, MD;  Location: [facility];  Service: Cardiovascular;  Laterality: N/A;  . ABDOMINAL AORTOGRAM W/LOWER EXTREMITY N/A 07/29/2019   Procedure: ABDOMINAL AORTOGRAM W/LOWER  EXTREMITY;  Surgeon: Wellington Hampshire, MD;  Location: [facility];  Service: Cardiovascular;  Laterality: N/A;  . APPENDECTOMY    . CARDIAC CATHETERIZATION    . CARDIAC DEFIBRILLATOR REMOVAL  2005  . HERNIA REPAIR  09/11/11   repair of LIH   . INGUINAL HERNIA REPAIR  09/11/2011   Procedure: HERNIA REPAIR INGUINAL ADULT;  Surgeon: Judieth Keens, DO;  Location: [facility];  Service: General;  Laterality: Left;  open left inguinal hernia with mesh  . LOWER EXTREMITY ANGIOGRAM N/A 02/05/2013   Procedure: LOWER EXTREMITY ANGIOGRAM;  Surgeon: Jettie Booze, MD;  Location: [facility] C[facility] LAB;  Service: Cardiovascular;  Laterality: N/A;  . PERIPHERAL VASCULAR BALLOON ANGIOPLASTY  07/29/2019   Procedure: PERIPHERAL VASCULAR BALLOON ANGIOPLASTY;  Surgeon: Wellington Hampshire, MD;  Location: [facility];  Service: Cardiovascular;;  . PERIPHERAL VASCULAR INTERVENTION Left 09/03/2018   Procedure: PERIPHERAL VASCULAR INTERVENTION;  Surgeon: Wellington Hampshire, MD;  Location: [facility];  Service: Cardiovascular;  Laterality: Left;    FAMHx:  Family History  Problem Relation Age of Onset  . Cancer Mother     SOCHx:   reports that he has been smoking cigars. He has a 48.00 pack-year smoking history. He has never used  smokeless tobacco. He reports current alcohol use of about 7.0 standard drinks of alcohol per week. He reports that he does not use drugs.  ALLERGIES:  Allergies  Allergen Reactions  . Sulfa Antibiotics Other (See Comments)    Unknown reaction - childhood allergy    ROS: Pertinent items noted in HPI and remainder of comprehensive ROS otherwise negative.  HOME MEDS: Current Outpatient Medications on File Prior to Visit  Medication Sig Dispense Refill  . aspirin (ASPIRIN CHILDRENS) 81 MG chewable tablet Chew 1 tablet (81 mg total) by mouth daily. 36 tablet 11  . carvedilol (COREG) 12.5 MG tablet Take 1 tablet (12.5 mg total) by mouth 2 (two) times daily with a meal.  180 tablet 3  . clopidogrel (PLAVIX) 75 MG tablet Take 1 tablet (75 mg total) by mouth daily with breakfast. 30 tablet 11  . diphenhydramine-acetaminophen (TYLENOL PM) 25-500 MG TABS tablet Take 2 tablets by mouth at bedtime.    [name] FIBER PO Take 1 capsule by mouth 2 (two) times daily.    . mirabegron ER (MYRBETRIQ) 50 MG TB24 tablet Take 50 mg by mouth daily.    . Multiple Vitamins-Minerals (ONE-A-DAY MENS 50+ ADVANTAGE PO) Take 1 tablet by mouth daily.     [name] PARoxetine (PAXIL) 40 MG tablet Take 40 mg by mouth daily.    . rosuvastatin (CRESTOR) 40 MG tablet Take 1 tablet (40 mg total) by mouth daily. 90 tablet 3  . sacubitril-valsartan (ENTRESTO) 97-103 MG Take 1 tablet by mouth 2 (two) times daily. 60 tablet 5  . spironolactone (ALDACTONE) 25 MG tablet Take 1 tablet (25 mg total) by mouth daily. 90 tablet 3  . venlafaxine XR (EFFEXOR-XR) 75 MG 24 hr capsule Take 75 mg by mouth daily.    . prednisoLONE acetate (PRED FORTE) 1 % ophthalmic suspension 1 DROP IN LEFT EYE TWICE A DAY USE FOR 5 DAYS AFTER LASER PROCEDURE     No current facility-administered medications on file prior to visit.    LABS/IMAGING: No results found for this or any previous visit (from the past 48 hour(s)). No results found.  LIPID PANEL:    Component Value Date/Time   CHOL 177 11/20/2019 1231   TRIG 142 11/20/2019 1231   HDL 60 11/20/2019 1231   CHOLHDL 3.0 11/20/2019 1231   VLDL 28 11/20/2019 1231   LDLCALC 89 11/20/2019 1231    WEIGHTS: Wt Readings from Last 3 Encounters:  11/24/19 148 lb 12.8 oz (67.5 kg)  11/20/19 148 lb 12.8 oz (67.5 kg)  09/23/19 150 lb 12.8 oz (68.4 kg)    VITALS: BP 99/68   Pulse 81   Temp (!) 97 F (36.1 C)   Ht 5\' 10"  (1.778 m)   Wt 148 lb 12.8 oz (67.5 kg)   BMI 21.35 kg/m   EXAM: Deferred  EKG: Deferred  ASSESSMENT: 1. Mixed dyslipidemia, goal LDL less than 70 2. Chronic systolic congestive heart failure 3. Status post AICD 4. PAD  PLAN: 1.   Nicholas Herrera  has recent worsening systolic heart failure, history of PAD and possibly coronary disease, status post AICD with a planned upcoming right and left heart catheterization.  He was recently seen in the congestive heart failure clinic and his medications were uptitrated.  Of note his blood pressure was low normal today however he has been asymptomatic with it.  His lipids remain above target LDL less than 70.  He is tolerating high potency rosuvastatin daily.  I recommend adding ezetimibe 10 mg  daily to his regimen which should help him reach target LDL less than 70.  I do not think we have need to reach for PCSK9 inhibitor at this point.  Thanks again for the kind referral.  Pixie Casino, MD, FACC, [place] Director of the Advanced Lipid Disorders &  Cardiovascular Risk Reduction Clinic Diplomate of the American Board of Clinical Lipidology Attending Cardiologist  Direct Dial: [phone]  Fax: [phone]  Website:  [email or web address] 11/24/2019, 12:34 PM

## 2019-11-24 NOTE — Patient Instructions (Signed)
Medication Instructions:  START zetia 10mg  daily  Continue other current medications  *If you need a refill on your cardiac medications before your next appointment, please call your pharmacy*  Lab Work: FASTING lab work in 3 months to check cholesterol - please complete before next lipid clinic visit with Dr. Debara Pickett  If you have labs (blood work) drawn today and your tests are completely normal, you will receive your results only by: [name] MyChart Message (if you have MyChart) OR . A paper copy in the mail If you have any lab test that is abnormal or we need to change your treatment, we will call you to review the results.  Testing/Procedures: NONE  Follow-Up: At [facility], you and your health needs are our priority.  As part of our continuing mission to provide you with exceptional heart care, we have created designated Provider Care Teams.  These Care Teams include your primary Cardiologist (physician) and Advanced Practice Providers (APPs -  Physician Assistants and Nurse Practitioners) who all work together to provide you with the care you need, when you need it.  Your next appointment:   3 month(s) - lipid clinic  The format for your next appointment:   Either In Person or Virtual  Provider:   K. Mali Hilty, MD  Other Instructions

## 2019-11-25 ENCOUNTER — Telehealth: Payer: Self-pay | Admitting: Interventional Cardiology

## 2019-11-25 NOTE — Telephone Encounter (Signed)
Yes pt can start taking ezetimibe before his cath.

## 2019-11-25 NOTE — Telephone Encounter (Signed)
Called and LVM advising patient that it was safe to start new medication. Left call back number

## 2019-11-25 NOTE — Telephone Encounter (Signed)
I will route to PharmD to advise if okay to start new med before Cath-

## 2019-11-25 NOTE — Telephone Encounter (Signed)
  Patient was put on ezetimibe (ZETIA) 10 MG tablet by Dr Debara Pickett on 11/24/19 and he wants to know if it is safe for him to start this medication before his cath procedure on 12/15/19. Please advise.

## 2019-11-30 ENCOUNTER — Ambulatory Visit: Payer: Medicare Other

## 2019-11-30 ENCOUNTER — Ambulatory Visit
Admission: RE | Admit: 2019-11-30 | Discharge: 2019-11-30 | Disposition: A | Discharge status: Home / Self Care | Payer: Medicare Other | Source: Ambulatory Visit | Attending: Internal Medicine | Admitting: Internal Medicine

## 2019-11-30 ENCOUNTER — Other Ambulatory Visit: Payer: Self-pay

## 2019-11-30 DIAGNOSIS — Z23 Encounter for immunization: Secondary | ICD-10-CM | POA: Insufficient documentation

## 2019-11-30 DIAGNOSIS — I5042 Chronic combined systolic (congestive) and diastolic (congestive) heart failure: Secondary | ICD-10-CM | POA: Insufficient documentation

## 2019-11-30 LAB — BASIC METABOLIC PANEL
Anion gap: 11 (ref 5–15)
BUN: 15 mg/dL (ref 8–23)
CO2: 26 mmol/L (ref 22–32)
Calcium: 9.4 mg/dL (ref 8.9–10.3)
Chloride: 102 mmol/L (ref 98–111)
Creatinine, Ser: 0.97 mg/dL (ref 0.61–1.24)
GFR calc Af Amer: >60 mL/min (ref >60)
GFR calc non Af Amer: >60 mL/min (ref >60)
Glucose, Bld: 114 mg/dL — ABNORMAL HIGH (ref 70–99)
Potassium: 4.2 mmol/L (ref 3.5–5.1)
Sodium: 139 mmol/L (ref 135–145)

## 2019-11-30 NOTE — Progress Notes (Signed)
   Covid-19 Vaccination Clinic  Name:  [name]    MRN: [id number] DOB: 08-31-1941  11/30/2019  [name] was observed post Covid-19 immunization for 30 minutes based on pre-vaccination screening without incidence. He was provided with Vaccine Information Sheet and instruction to access the V-Safe system.   [name] was instructed to call 911 with any severe reactions post vaccine: [name] Difficulty breathing  . Swelling of your face and throat  . A fast heartbeat  . A bad rash all over your body  . Dizziness and weakness    Immunizations Administered    Name Date Dose VIS Date Route   Pfizer COVID-19 Vaccine 11/30/2019 11:14 AM 0.3 mL 10/09/2019 Intramuscular   Manufacturer: [place]   Lot: [id number]   [place]: [id number]

## 2019-12-01 ENCOUNTER — Ambulatory Visit: Payer: Medicare Other | Admitting: Internal Medicine

## 2019-12-10 ENCOUNTER — Telehealth: Payer: Self-pay | Admitting: Interventional Cardiology

## 2019-12-10 NOTE — Telephone Encounter (Signed)
Patient calling stating he is concerned about the whether 2/16 for his heart cath. He would like to know if he cannot make it to the appointment that day, who he needs to call.

## 2019-12-10 NOTE — Telephone Encounter (Signed)
Spoke with pt and made him aware if any issues with weather, call the office and we will contact the cath lab to cancel.  Pt appreciative for call.

## 2019-12-11 ENCOUNTER — Other Ambulatory Visit (HOSPITAL_COMMUNITY)
Admission: RE | Admit: 2019-12-11 | Discharge: 2019-12-11 | Disposition: A | Discharge status: Home / Self Care | Payer: Medicare Other | Source: Ambulatory Visit | Attending: Interventional Cardiology | Admitting: Interventional Cardiology

## 2019-12-11 DIAGNOSIS — Z20822 Contact with and (suspected) exposure to covid-19: Secondary | ICD-10-CM | POA: Diagnosis not present

## 2019-12-11 DIAGNOSIS — Z01812 Encounter for preprocedural laboratory examination: Secondary | ICD-10-CM | POA: Diagnosis not present

## 2019-12-11 LAB — SARS CORONAVIRUS 2 (TAT 6-24 HRS): SARS Coronavirus 2: NEGATIVE

## 2019-12-12 ENCOUNTER — Other Ambulatory Visit (HOSPITAL_COMMUNITY): Payer: Medicare Other

## 2019-12-14 ENCOUNTER — Telehealth: Payer: Self-pay | Admitting: *Deleted

## 2019-12-14 NOTE — H&P (Signed)
New reduxced LVEF. R and Left heart cath for discovery of coronary status and hemodynamic evaluation. Kidney function is normal.

## 2019-12-14 NOTE — Telephone Encounter (Signed)
Pt contacted pre-catheterization scheduled at [facility] for: Tuesday December 15, 2019 9 AM Verified arrival time and place: [facility]) at:  7 AM   No solid food after midnight prior to cath, clear liquids until 5 AM day of procedure. Contrast allergy: no  Hold: Spironolactone-AM of procedure.  Except hold medications AM meds can be  taken pre-cath with sip of water including: ASA 81 mg Plavix 75 mg  Confirmed patient has responsible adult to drive home post procedure and observe 24 hours after arriving home: yes  Currently, due to Covid-19 pandemic, only one person will be allowed with patient. Must be the same person for patient's entire stay and will be required to wear a mask. They will be asked to wait in the waiting room for the duration of the patient's stay.  Patients are required to wear a mask when they enter the hospital.      COVID-19 Pre-Screening Questions:  . In the past 7 to 10 days have you had a cough,  shortness of breath, headache, congestion, fever (100 or greater) body aches, chills, sore throat, or sudden loss of taste or sense of smell? no . Have you been around anyone with known Covid 19 in the past 7-10 days? no . Have you been around anyone who is awaiting Covid 19 test results in the past 7 to 10 days? no . Have you been around anyone who has been exposed to Covid 19, or has mentioned symptoms of Covid 19 within the past 7 to 10 days? no    I reviewed procedure/mask/visitor instructions, COVID-19 screening questions with patient, he verbalized understanding, thanked me for call.

## 2019-12-15 ENCOUNTER — Ambulatory Visit (HOSPITAL_COMMUNITY)
Admission: RE | Admit: 2019-12-15 | Discharge: 2019-12-15 | Disposition: A | Discharge status: Home / Self Care | Payer: Medicare Other | Attending: Interventional Cardiology | Admitting: Interventional Cardiology

## 2019-12-15 ENCOUNTER — Other Ambulatory Visit: Payer: Self-pay

## 2019-12-15 ENCOUNTER — Ambulatory Visit (HOSPITAL_COMMUNITY)
Admission: RE | Disposition: A | Discharge status: Home / Self Care | Payer: Self-pay | Source: Home / Self Care | Attending: Interventional Cardiology

## 2019-12-15 DIAGNOSIS — E785 Hyperlipidemia, unspecified: Secondary | ICD-10-CM | POA: Diagnosis present

## 2019-12-15 DIAGNOSIS — I5042 Chronic combined systolic (congestive) and diastolic (congestive) heart failure: Secondary | ICD-10-CM | POA: Diagnosis not present

## 2019-12-15 DIAGNOSIS — I739 Peripheral vascular disease, unspecified: Secondary | ICD-10-CM | POA: Diagnosis present

## 2019-12-15 DIAGNOSIS — F17210 Nicotine dependence, cigarettes, uncomplicated: Secondary | ICD-10-CM | POA: Diagnosis not present

## 2019-12-15 DIAGNOSIS — Z79899 Other long term (current) drug therapy: Secondary | ICD-10-CM | POA: Diagnosis not present

## 2019-12-15 DIAGNOSIS — Z7982 Long term (current) use of aspirin: Secondary | ICD-10-CM | POA: Insufficient documentation

## 2019-12-15 DIAGNOSIS — I5022 Chronic systolic (congestive) heart failure: Secondary | ICD-10-CM | POA: Insufficient documentation

## 2019-12-15 DIAGNOSIS — F17200 Nicotine dependence, unspecified, uncomplicated: Secondary | ICD-10-CM | POA: Diagnosis present

## 2019-12-15 DIAGNOSIS — I130 Hypertensive heart and chronic kidney disease with heart failure and stage 1 through stage 4 chronic kidney disease, or unspecified chronic kidney disease: Secondary | ICD-10-CM | POA: Diagnosis not present

## 2019-12-15 DIAGNOSIS — I2510 Atherosclerotic heart disease of native coronary artery without angina pectoris: Secondary | ICD-10-CM | POA: Diagnosis not present

## 2019-12-15 DIAGNOSIS — Z95810 Presence of automatic (implantable) cardiac defibrillator: Secondary | ICD-10-CM | POA: Insufficient documentation

## 2019-12-15 DIAGNOSIS — I429 Cardiomyopathy, unspecified: Secondary | ICD-10-CM | POA: Diagnosis not present

## 2019-12-15 DIAGNOSIS — I10 Essential (primary) hypertension: Secondary | ICD-10-CM | POA: Diagnosis present

## 2019-12-15 DIAGNOSIS — N183 Chronic kidney disease, stage 3 unspecified: Secondary | ICD-10-CM | POA: Insufficient documentation

## 2019-12-15 DIAGNOSIS — Z7902 Long term (current) use of antithrombotics/antiplatelets: Secondary | ICD-10-CM | POA: Insufficient documentation

## 2019-12-15 HISTORY — PX: RIGHT/LEFT HEART CATH AND CORONARY ANGIOGRAPHY: CATH118266

## 2019-12-15 LAB — POCT I-STAT EG7
Acid-Base Excess: 1 mmol/L (ref 0.0–2.0)
Bicarbonate: 27 mmol/L (ref 20.0–28.0)
Calcium, Ion: 1.2 mmol/L (ref 1.15–1.40)
HCT: 38 % — ABNORMAL LOW (ref 39.0–52.0)
Hemoglobin: 12.9 g/dL — ABNORMAL LOW (ref 13.0–17.0)
O2 Saturation: 72 %
Potassium: 4.1 mmol/L (ref 3.5–5.1)
Sodium: 141 mmol/L (ref 135–145)
TCO2: 28 mmol/L (ref 22–32)
pCO2, Ven: 47 mmHg (ref 44.0–60.0)
pH, Ven: 7.367 (ref 7.250–7.430)
pO2, Ven: 40 mmHg (ref 32.0–45.0)

## 2019-12-15 LAB — POCT I-STAT 7, (LYTES, BLD GAS, ICA,H+H)
Bicarbonate: 24.9 mmol/L (ref 20.0–28.0)
Calcium, Ion: 1.18 mmol/L (ref 1.15–1.40)
HCT: 38 % — ABNORMAL LOW (ref 39.0–52.0)
Hemoglobin: 12.9 g/dL — ABNORMAL LOW (ref 13.0–17.0)
O2 Saturation: 99 %
Potassium: 4.1 mmol/L (ref 3.5–5.1)
Sodium: 140 mmol/L (ref 135–145)
TCO2: 26 mmol/L (ref 22–32)
pCO2 arterial: 41.1 mmHg (ref 32.0–48.0)
pH, Arterial: 7.39 (ref 7.350–7.450)
pO2, Arterial: 120 mmHg — ABNORMAL HIGH (ref 83.0–108.0)

## 2019-12-15 SURGERY — RIGHT/LEFT HEART CATH AND CORONARY ANGIOGRAPHY
Anesthesia: LOCAL

## 2019-12-15 MED ORDER — HEPARIN SODIUM (PORCINE) 1000 UNIT/ML IJ SOLN
INTRAMUSCULAR | Status: AC
  Filled 2019-12-15: qty 1

## 2019-12-15 MED ORDER — VERAPAMIL HCL 2.5 MG/ML IV SOLN
INTRAVENOUS | Status: AC
  Filled 2019-12-15: qty 2

## 2019-12-15 MED ORDER — HYDRALAZINE HCL 20 MG/ML IJ SOLN: 10.0000 mg | INTRAMUSCULAR | Status: DC | PRN

## 2019-12-15 MED ORDER — ONDANSETRON HCL 4 MG/2ML IJ SOLN: 4.0000 mg | Freq: Four times a day (QID) | INTRAMUSCULAR | Status: DC | PRN

## 2019-12-15 MED ORDER — SODIUM CHLORIDE 0.9% FLUSH: 3.0000 mL | Freq: Two times a day (BID) | INTRAVENOUS | Status: DC

## 2019-12-15 MED ORDER — HEPARIN (PORCINE) IN NACL 1000-0.9 UT/500ML-% IV SOLN
INTRAVENOUS | Status: AC
  Filled 2019-12-15: qty 1000

## 2019-12-15 MED ORDER — FENTANYL CITRATE (PF) 100 MCG/2ML IJ SOLN
INTRAMUSCULAR | Status: AC
  Filled 2019-12-15: qty 2

## 2019-12-15 MED ORDER — HEPARIN (PORCINE) IN NACL 1000-0.9 UT/500ML-% IV SOLN
INTRAVENOUS | Status: DC | PRN
  Administered 2019-12-15 (×3): 500 mL

## 2019-12-15 MED ORDER — VERAPAMIL HCL 2.5 MG/ML IV SOLN
INTRAVENOUS | Status: DC | PRN
  Administered 2019-12-15: 10 mL via INTRA_ARTERIAL

## 2019-12-15 MED ORDER — LIDOCAINE HCL (PF) 1 % IJ SOLN
INTRAMUSCULAR | Status: AC
  Filled 2019-12-15: qty 30

## 2019-12-15 MED ORDER — HEPARIN (PORCINE) IN NACL 1000-0.9 UT/500ML-% IV SOLN
INTRAVENOUS | Status: AC
  Filled 2019-12-15: qty 500

## 2019-12-15 MED ORDER — SODIUM CHLORIDE 0.9% FLUSH: 3.0000 mL | INTRAVENOUS | Status: DC | PRN

## 2019-12-15 MED ORDER — SODIUM CHLORIDE 0.9 % IV SOLN: INTRAVENOUS | Status: DC

## 2019-12-15 MED ORDER — MIDAZOLAM HCL 2 MG/2ML IJ SOLN
INTRAMUSCULAR | Status: AC
  Filled 2019-12-15: qty 2

## 2019-12-15 MED ORDER — HEPARIN SODIUM (PORCINE) 1000 UNIT/ML IJ SOLN
INTRAMUSCULAR | Status: DC | PRN
  Administered 2019-12-15: 3500 [IU] via INTRAVENOUS

## 2019-12-15 MED ORDER — OXYCODONE HCL 5 MG PO TABS: 5.0000 mg | ORAL_TABLET | ORAL | Status: DC | PRN

## 2019-12-15 MED ORDER — MIDAZOLAM HCL 2 MG/2ML IJ SOLN
INTRAMUSCULAR | Status: DC | PRN
  Administered 2019-12-15 (×2): 1 mg via INTRAVENOUS

## 2019-12-15 MED ORDER — IOHEXOL 350 MG/ML SOLN
INTRAVENOUS | Status: DC | PRN
  Administered 2019-12-15: 65 mL

## 2019-12-15 MED ORDER — SODIUM CHLORIDE 0.9 % IV SOLN: 250.0000 mL | INTRAVENOUS | Status: DC | PRN

## 2019-12-15 MED ORDER — LIDOCAINE HCL (PF) 1 % IJ SOLN
INTRAMUSCULAR | Status: DC | PRN
  Administered 2019-12-15 (×2): 2 mL

## 2019-12-15 MED ORDER — ACETAMINOPHEN 325 MG PO TABS: 650.0000 mg | ORAL_TABLET | ORAL | Status: DC | PRN

## 2019-12-15 MED ORDER — LABETALOL HCL 5 MG/ML IV SOLN: 10.0000 mg | INTRAVENOUS | Status: DC | PRN

## 2019-12-15 MED ORDER — FENTANYL CITRATE (PF) 100 MCG/2ML IJ SOLN
INTRAMUSCULAR | Status: DC | PRN
  Administered 2019-12-15: 25 ug via INTRAVENOUS

## 2019-12-15 SURGICAL SUPPLY — 13 items

## 2019-12-15 NOTE — Discharge Instructions (Signed)
Radial Site Care  This sheet gives you information about how to care for yourself after your procedure. Your health care provider may also give you more specific instructions. If you have problems or questions, contact your health care provider. What can I expect after the procedure? After the procedure, it is common to have:  Bruising and tenderness at the catheter insertion area. Follow these instructions at home: Medicines  Take over-the-counter and prescription medicines only as told by your health care provider. Insertion site care  Follow instructions from your health care provider about how to take care of your insertion site. Make sure you: ? Wash your hands with soap and water before you change your bandage (dressing). If soap and water are not available, use hand sanitizer. ? Change your dressing as told by your health care provider. ? Leave stitches (sutures), skin glue, or adhesive strips in place. These skin closures may need to stay in place for 2 weeks or longer. If adhesive strip edges start to loosen and curl up, you may trim the loose edges. Do not remove adhesive strips completely unless your health care provider tells you to do that.  Check your insertion site every day for signs of infection. Check for: ? Redness, swelling, or pain. ? Fluid or blood. ? Pus or a bad smell. ? Warmth.  Do not take baths, swim, or use a hot tub until your health care provider approves.  You may shower 24-48 hours after the procedure, or as directed by your health care provider. ? Remove the dressing and gently wash the site with plain soap and water. ? Pat the area dry with a clean towel. ? Do not rub the site. That could cause bleeding.  Do not apply powder or lotion to the site. Activity   For 24 hours after the procedure, or as directed by your health care provider: ? Do not flex or bend the affected arm. ? Do not push or pull heavy objects with the affected arm. ? Do not  drive yourself home from the hospital or clinic. You may drive 24 hours after the procedure unless your health care provider tells you not to. ? Do not operate machinery or power tools.  Do not lift anything that is heavier than 10 lb (4.5 kg), or the limit that you are told, until your health care provider says that it is safe.  Ask your health care provider when it is okay to: ? Return to work or school. ? Resume usual physical activities or sports. ? Resume sexual activity. General instructions  If the catheter site starts to bleed, raise your arm and put firm pressure on the site. If the bleeding does not stop, get help right away. This is a medical emergency.  If you went home on the same day as your procedure, a responsible adult should be with you for the first 24 hours after you arrive home.  Keep all follow-up visits as told by your health care provider. This is important. Contact a health care provider if:  You have a fever.  You have redness, swelling, or yellow drainage around your insertion site. Get help right away if:  You have unusual pain at the radial site.  The catheter insertion area swells very fast.  The insertion area is bleeding, and the bleeding does not stop when you hold steady pressure on the area.  Your arm or hand becomes pale, cool, tingly, or numb. These symptoms may represent a serious problem   that is an emergency. Do not wait to see if the symptoms will go away. Get medical help right away. Call your local emergency services (911 in the [place] Do not drive yourself to the hospital. Summary  After the procedure, it is common to have bruising and tenderness at the site.  Follow instructions from your health care provider about how to take care of your radial site wound. Check the wound every day for signs of infection.  Do not lift anything that is heavier than 10 lb (4.5 kg), or the limit that you are told, until your health care provider says  that it is safe. This information is not intended to replace advice given to you by your health care provider. Make sure you discuss any questions you have with your health care provider. Document Revised: 11/20/2017 Document Reviewed: 11/20/2017 Elsevier Patient Education  2020 Elsevier Inc.  

## 2019-12-15 NOTE — CV Procedure (Signed)
   Right and left heart catheterization via right antecubital vein and right radial artery using real-time vascular ultrasound.  Normal right heart pressures including capillary wedge.  Widely patent coronary arteries with eccentric up to 50% mid LAD, eccentric 40 to 50% mid circumflex, and mid and distal eccentric 30 to 40% RCA.  RCA is dominant.  Normal LV cavity size with LVEF 35 to 40%.  Normal LV end-diastolic pressure.  Heavy calcification in the proximal to mid LAD and in the circumflex.  Guideline directed therapy for LV systolic dysfunction.  Preventive therapy including aggressive LDL lowering, smoking cessation, blood pressure control, and glycemic control (if appropriate) for lower extremity and coronary atherosclerosis.

## 2019-12-15 NOTE — Interval H&P Note (Signed)
Cath Lab Visit (complete for each Cath Lab visit)  Clinical Evaluation Leading to the Procedure:   ACS: No.  Non-ACS:    Anginal Classification: CCS II  Anti-ischemic medical therapy: No Therapy  Non-Invasive Test Results: No non-invasive testing performed  Prior CABG: No previous CABG      History and Physical Interval Note:  12/15/2019 9:42 AM  Nicholas Herrera  has presented today for surgery, with the diagnosis of heart failure.  The various methods of treatment have been discussed with the patient and family. After consideration of risks, benefits and other options for treatment, the patient has consented to  Procedure(s): RIGHT/LEFT HEART CATH AND CORONARY ANGIOGRAPHY (N/A) as a surgical intervention.  The patient's history has been reviewed, patient examined, no change in status, stable for surgery.  I have reviewed the patient's chart and labs.  Questions were answered to the patient's satisfaction.     [name] III

## 2019-12-16 MED FILL — Heparin Sod (Porcine)-NaCl IV Soln 1000 Unit/500ML-0.9%: INTRAVENOUS | Qty: 500 | Status: AC

## 2019-12-28 ENCOUNTER — Encounter (HOSPITAL_COMMUNITY): Payer: Self-pay | Admitting: Cardiology

## 2019-12-28 ENCOUNTER — Other Ambulatory Visit: Payer: Self-pay

## 2019-12-28 ENCOUNTER — Ambulatory Visit (HOSPITAL_COMMUNITY)
Admission: RE | Admit: 2019-12-28 | Discharge: 2019-12-28 | Disposition: A | Discharge status: Home / Self Care | Payer: Medicare Other | Source: Ambulatory Visit | Attending: Cardiology | Admitting: Cardiology

## 2019-12-28 VITALS — BP 108/70 | HR 67 | Wt 149.8 lb

## 2019-12-28 DIAGNOSIS — Z7982 Long term (current) use of aspirin: Secondary | ICD-10-CM | POA: Diagnosis not present

## 2019-12-28 DIAGNOSIS — N183 Chronic kidney disease, stage 3 unspecified: Secondary | ICD-10-CM | POA: Insufficient documentation

## 2019-12-28 DIAGNOSIS — I130 Hypertensive heart and chronic kidney disease with heart failure and stage 1 through stage 4 chronic kidney disease, or unspecified chronic kidney disease: Secondary | ICD-10-CM | POA: Diagnosis not present

## 2019-12-28 DIAGNOSIS — I739 Peripheral vascular disease, unspecified: Secondary | ICD-10-CM | POA: Diagnosis not present

## 2019-12-28 DIAGNOSIS — F17210 Nicotine dependence, cigarettes, uncomplicated: Secondary | ICD-10-CM | POA: Diagnosis not present

## 2019-12-28 DIAGNOSIS — I5042 Chronic combined systolic (congestive) and diastolic (congestive) heart failure: Secondary | ICD-10-CM

## 2019-12-28 DIAGNOSIS — Z79899 Other long term (current) drug therapy: Secondary | ICD-10-CM | POA: Diagnosis not present

## 2019-12-28 DIAGNOSIS — I5022 Chronic systolic (congestive) heart failure: Secondary | ICD-10-CM | POA: Diagnosis not present

## 2019-12-28 DIAGNOSIS — Z95810 Presence of automatic (implantable) cardiac defibrillator: Secondary | ICD-10-CM | POA: Diagnosis not present

## 2019-12-28 DIAGNOSIS — I2510 Atherosclerotic heart disease of native coronary artery without angina pectoris: Secondary | ICD-10-CM | POA: Insufficient documentation

## 2019-12-28 DIAGNOSIS — I428 Other cardiomyopathies: Secondary | ICD-10-CM | POA: Diagnosis not present

## 2019-12-28 DIAGNOSIS — E785 Hyperlipidemia, unspecified: Secondary | ICD-10-CM | POA: Diagnosis not present

## 2019-12-28 MED ORDER — CARVEDILOL 12.5 MG PO TABS: 18.7500 mg | ORAL_TABLET | Freq: Two times a day (BID) | ORAL | 3 refills | Status: DC

## 2019-12-28 NOTE — Progress Notes (Signed)
PCP: Jonathon Jordan, MD Cardiology: Dr. Tamala Julian HF Cardiology: Dr. Aundra Dubin  79 y.o. with PAD, smoking, and a long history of cardiomyopathy was referred by Dr. Tamala Julian for evaluation of CHF/cardiomyopathy.  Patient was diagnosed with a cardiomyopathy prior to 2004 while he was living in [place].  He had an ICD placed in 2004.  Per notes, EF was < 30% during this time.  He thinks he may have had a heart catheterization but does not remember the details.  The ICD subsequently malfunctioned and was turned off in 2009.  It is still implanted and has 2 nonfunctioning leads.  Echo in 2011 after moving the [facility] showed improved EF at 40-45%.  He did not have an echo over the next 9 years.  However, Echo done in 7/20 showed EF down to 20-25% with a moderately dilated LV.  He has been subsequently started on HF meds.  He saw Dr. Lovena Le to decide about explantation of old ICD/implantation of new ICD.  He was thought to be not a candidate for a new intravascular ICD.  Subcutaneous ICD would be a potential option but utility would like be limited with nonischemic cardiomyopathy at age 32.   Patient also has an extensive PAD history.  He still smokes 1/2-1 ppd.  His last peripheral vascular intervention was in 9/20 with PTCA to left external and common iliac arteries.  Currently, he gets right calf claudication after walking longer distances.    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  He was well-compensated.   He returns today for followup of CHF.  He is trying to walk more, still with right calf pain after 1.5 blocks.  No significant exertional dyspnea.  No chest pain.  No lightheadedness.  No orthopnea/PND.   Labs (2/20): LDL 93 Labs (11/20): K 4, creatinine 0.99  Labs (1/21): LDL 89 Labs (2/21): K 4.2, creatinine 0.97  PMH:  1. HTN 2. Hyperlipidemia 3. Active smoker 4. PAD: Right popliteal stent in 2014 with subsequent occlusion.   - Orbital atherectomy + drug  coated balloon angioplasty of left SFA in 2019.   - In 9/20, had PTCA left external and common iliac arteries.  5. CKD: Stage 3.  6. Cardiomyopathy: Patient had an ICD placed in [place] in 2004, so presumably had a pre-dating cardiomyopathy.  He thinks that he had a heart cath in [place] but is not sure.  EF <30% when he moved to [place].  - Echo (2011) with EF improved to 40-45%.  - Echo (7/20): EF 20-25%, moderate LV dilation, normal RV size and systolic function.  - Patient has 2 nonfunctioning ICD leads (ICD is not functional, turned off in 2009).  - LHC/RHC (2/21): Nonobstructive CAD.  Mean RA 3, PA 25/5, mean PCWP 7, CI 2.4.   SH: Retired 15 years ago from [employer].  Married with 3 children, smokes about 1/2-1 ppd still, has had about 3 drinks/day for 2-3 years.   FH: Mother with cancer, father with COPD.  No heart disease that he knows of.   ROS: All systems reviewed and negative except as per HPI.   Current Outpatient Medications  Medication Sig Dispense Refill  . aspirin (ASPIRIN CHILDRENS) 81 MG chewable tablet Chew 1 tablet (81 mg total) by mouth daily. 36 tablet 11  . carvedilol (COREG) 12.5 MG tablet Take 1.5 tablets (18.75 mg total) by mouth 2 (two) times daily with a meal. 90 tablet 3  . clopidogrel (PLAVIX) 75 MG tablet Take 1  tablet (75 mg total) by mouth daily with breakfast. 30 tablet 11  . diphenhydramine-acetaminophen (TYLENOL PM) 25-500 MG TABS tablet Take 2 tablets by mouth at bedtime.    [name] ezetimibe (ZETIA) 10 MG tablet Take 1 tablet (10 mg total) by mouth daily. 90 tablet 3  . FIBER PO Take 1 capsule by mouth 2 (two) times daily.    . mirabegron ER (MYRBETRIQ) 50 MG TB24 tablet Take 50 mg by mouth daily.    . Multiple Vitamins-Minerals (ONE-A-DAY MENS 50+ ADVANTAGE PO) Take 1 tablet by mouth daily.     [name] PARoxetine (PAXIL) 40 MG tablet Take 40 mg by mouth daily.    . rosuvastatin (CRESTOR) 40 MG tablet Take 1 tablet (40 mg total) by mouth daily. 90 tablet  3  . sacubitril-valsartan (ENTRESTO) 97-103 MG Take 1 tablet by mouth 2 (two) times daily. 60 tablet 5  . spironolactone (ALDACTONE) 25 MG tablet Take 1 tablet (25 mg total) by mouth daily. 90 tablet 3  . venlafaxine XR (EFFEXOR-XR) 75 MG 24 hr capsule Take 75 mg by mouth daily.     No current facility-administered medications for this encounter.   BP 108/70   Pulse 67   Wt 67.9 kg (149 lb 12.8 oz)   SpO2 98%   BMI 21.49 kg/m  General: NAD Neck: No JVD, no thyromegaly or thyroid nodule.  Lungs: Clear to auscultation bilaterally with normal respiratory effort. CV: Nondisplaced PMI.  Heart regular S1/S2, no S3/S4, no murmur.  No peripheral edema.  No carotid bruit.  Unable to palpate pedal pulses.  Abdomen: Soft, nontender, no hepatosplenomegaly, no distention.  Skin: Intact without lesions or rashes.  Neurologic: Alert and oriented x 3.  Psych: Normal affect. Extremities: No clubbing or cyanosis.  HEENT: Normal.   Assessment/Plan: 1. Chronic systolic CHF:  Patient has had a known cardiomyopathy since prior to 2004.  EF had improved to 40-45% in 2011 but had dropped significantly on echo in 7/20 with EF 20-25%. He has a nonfunctioning ICD.  Cause of the cardiomyopathy is uncertain.  Coronary angiography in 2/21 showed no significant coronary disease. He has no family history of cardiomyopathy.  While he drinks about 3 alcoholic drinks/day currently, he did not drink regularly prior to 2-3 years ago so I do not think that ETOH explains his cardiomyopathy. RHC in 2/21 showed normal filling pressures and normal cardiac output.  NYHA class II symptoms, he is not volume overloaded on exam.   - I asked him to cut back on ETOH to no more than 1 drink per day.  - Continue Entresto 97/103 bid.    - Continue spironolactone 25 mg daily.  - Increase Coreg to 18.75 mg bid.  - He does not appear to need Lasix.  - Next step will be addition of dapagliflozin.   - Patient is not a candidate for another  intravascular ICD.  Limited utility to implanting subcutaneous ICD in the setting of a nonischemic cardiomyopathy.   2. PAD: Stable mild right calf claudication.  Followed by Dr. Fletcher Anon.  - Continue to walk through pain at least a short distance.  - Continue ASA 81 and Crestor 40 mg daily.  - Needs to quit smoking => discussed with patient again today.  3. HTN: BP on the lower side.  4. Hyperlipidemia: LDL was 89 in 1/21.  With PAD, would like to see LDL < 70.  He is on Crestor 40 mg daily and Zetia has been added.  - Check lipids  LFTs in 2 months.   Followup in office in 2 months.   Loralie Champagne 12/28/2019

## 2019-12-28 NOTE — Patient Instructions (Signed)
INCREASE Coreg (Carvedilol) to 18.75mg  (1.5 tabs) twice a day   Labs today We will only contact you if something comes back abnormal or we need to make some changes. Otherwise no news is good news!   Your physician recommends that you schedule a follow-up appointment in: 3 weeks Lauren the Pharmacist and 2 months with Dr Aundra Dubin.   Please call office at [phone] option 2 if you have any questions or concerns.    At the [facility], you and your health needs are our priority. As part of our continuing mission to provide you with exceptional heart care, we have created designated Provider Care Teams. These Care Teams include your primary Cardiologist (physician) and Advanced Practice Providers (APPs- Physician Assistants and Nurse Practitioners) who all work together to provide you with the care you need, when you need it.    You may see any of the following providers on your designated Care Team at your next follow up: [name] Dr Glori Bickers . Dr Loralie Champagne . Darrick Grinder, NP . Lyda Jester, PA . Audry Riles, PharmD   Please be sure to bring in all your medications bottles to every appointment.

## 2019-12-30 DIAGNOSIS — Z79899 Other long term (current) drug therapy: Secondary | ICD-10-CM | POA: Diagnosis not present

## 2019-12-30 DIAGNOSIS — I739 Peripheral vascular disease, unspecified: Secondary | ICD-10-CM | POA: Diagnosis not present

## 2019-12-30 DIAGNOSIS — I2510 Atherosclerotic heart disease of native coronary artery without angina pectoris: Secondary | ICD-10-CM | POA: Diagnosis not present

## 2019-12-30 DIAGNOSIS — K5730 Diverticulosis of large intestine without perforation or abscess without bleeding: Secondary | ICD-10-CM | POA: Diagnosis not present

## 2019-12-30 DIAGNOSIS — I5040 Unspecified combined systolic (congestive) and diastolic (congestive) heart failure: Secondary | ICD-10-CM | POA: Diagnosis not present

## 2019-12-30 DIAGNOSIS — E785 Hyperlipidemia, unspecified: Secondary | ICD-10-CM | POA: Diagnosis not present

## 2019-12-30 DIAGNOSIS — N200 Calculus of kidney: Secondary | ICD-10-CM | POA: Diagnosis not present

## 2019-12-30 DIAGNOSIS — Z950 Presence of cardiac pacemaker: Secondary | ICD-10-CM | POA: Diagnosis not present

## 2019-12-30 DIAGNOSIS — I10 Essential (primary) hypertension: Secondary | ICD-10-CM | POA: Diagnosis not present

## 2019-12-30 DIAGNOSIS — Z0000 Encounter for general adult medical examination without abnormal findings: Secondary | ICD-10-CM | POA: Diagnosis not present

## 2019-12-30 DIAGNOSIS — R7303 Prediabetes: Secondary | ICD-10-CM | POA: Diagnosis not present

## 2019-12-30 DIAGNOSIS — Z131 Encounter for screening for diabetes mellitus: Secondary | ICD-10-CM | POA: Diagnosis not present

## 2020-01-06 NOTE — Progress Notes (Signed)
PCP: Jonathon Jordan, MD Cardiology: Dr. Tamala Julian HF Cardiology: Dr. Aundra Dubin  HPI:  79 y.o. with PAD, smoking, and a long history of cardiomyopathy was referred by Dr. Tamala Julian for evaluation of CHF/cardiomyopathy.  Patient was diagnosed with a cardiomyopathy prior to 2004 while he was living in [place].  He had an ICD placed in 2004.  Per notes, EF was < 30% during this time.  He thinks he may have had a heart catheterization but does not remember the details.  The ICD subsequently malfunctioned and was turned off in 2009.  It is still implanted and has 2 nonfunctioning leads.  Echo in 2011 after moving to [facility] showed improved EF at 40-45%.  He did not have an echo over the next 9 years.  However, Echo done in 7/20 showed EF down to 20-25% with a moderately dilated LV.  He has been subsequently started on HF meds.  He saw Dr. Lovena Le to decide about explantation of old ICD/implantation of new ICD.  He was thought to be not a candidate for a new intravascular ICD.  Subcutaneous ICD would be a potential option but utility would like be limited with nonischemic cardiomyopathy at age 9.   Patient also has an extensive PAD history.  He still smokes 1/2-1 ppd.  His last peripheral vascular intervention was in 9/20 with PTCA to left external and common iliac arteries.  Currently, he gets right calf claudication after walking longer distances.    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  He was well-compensated.   Recently presented to HF Clinic for follow up on 12/28/19.  He was trying to walk more, still with right calf pain after 1.5 blocks.  No significant exertional dyspnea.  No chest pain.  No lightheadedness.  No orthopnea/PND.   Today he returns to HF clinic for pharmacist medication titration. At last visit with MD, carvedilol was increased to 18.75 mg BID. Overall he is feeling well today. Some dizziness when standing, but this is infrequent. No chest pains or  palpitations. He is still trying to walk more. He states his leg pain is better when he gets in a routine and walks consistently. No SOB/DOE on flat ground but did note he became SOB when walking uphill recently. His weight has been stable at 148-149 lbs. He is not taking any diuretic. No LEE, PND or orthopnea. His appetite is good. He has been trying to cut out sweets from his diet because he recently received a new diagnosis of diabetes (A1C 6.7%). Taking all medications as prescribed.   HF Medications: Carvedilol 18.75 mg BID Entresto 97/103 mg BID Spironolactone 25 mg daily  Has the patient been experiencing any side effects to the medications prescribed?  no  Does the patient have any problems obtaining medications due to transportation or finances?   No - has [employer]ARP Medicare Part D  Understanding of regimen: good Understanding of indications: good Potential of compliance: good Patient understands to avoid NSAIDs. Patient understands to avoid decongestants.    Pertinent Lab Values (11/30/19):  [name] Serum creatinine 0.97, BUN 15, Potassium 4.2, Sodium 139  Vital Signs: . Weight: 148.4 lbs (last clinic weight: 149.8 lbs) . Blood pressure: 134/84  . Heart rate: 68   Assessment: 1. Chronic systolic CHF:  Patient has had a known cardiomyopathy since prior to 2004.  EF had improved to 40-45% in 2011 but had dropped significantly on echo in 7/20 with EF 20-25%. He has a nonfunctioning ICD.  Cause  of the cardiomyopathy is uncertain.  Coronary angiography in 2/21 showed no significant coronary disease. He has no family history of cardiomyopathy.  While he drinks about 3 alcoholic drinks/day currently, he did not drink regularly prior to 2-3 years ago so unlikley that ETOH explains his cardiomyopathy. RHC in 2/21 showed normal filling pressures and normal cardiac output.   -NYHA class II symptoms, he is not volume overloaded on exam.   - He does not appear to need any furosemide.  - Continue  carvedilol 18.75 mg bid.  - Continue Entresto 97/103 mg bid.    - Continue spironolactone 25 mg daily.  - Start dapagliflozin 10 mg daily. Repeat BMET in 2 weeks. Counseled on possible side effects of hypotension, hypovolemia and genital yeast infections.    - Previously asked to cut back on ETOH to no more than 1 drink per day.  - Patient is not a candidate for another intravascular ICD.  Limited utility to implanting subcutaneous ICD in the setting of a nonischemic cardiomyopathy.   2. PAD: Stable mild right calf claudication.  Followed by Dr. Fletcher Anon.  - Continue to walk through pain at least a short distance.  - Continue ASA 81 and Crestor 40 mg daily.  - Needs to quit smoking => discussed with patient again today.  3. HTN: BP on the lower side.  4. Hyperlipidemia: LDL was 89 in 1/21.  With PAD, would like to see LDL < 70.  He is on Crestor 40 mg daily and Zetia has been added.  - Check lipids LFTs in 2 months.  5. Type 2 diabetes -New diagnosis, followed by PCP.  -Most recent A1C 6.7% (2021) -Start dapagliflozin 10 mg daily for benefits in HF and T2DM as above.   Plan: 1) Medication changes: Based on clinical presentation, vital signs and recent labs will start dapagliflozin 10 mg daily.  3) Follow-up: 1 month with Dr. Rush Farmer, PharmD, BCPS, [facility], CPP Heart Failure Clinic Pharmacist [phone]

## 2020-01-13 DIAGNOSIS — E1169 Type 2 diabetes mellitus with other specified complication: Secondary | ICD-10-CM | POA: Diagnosis not present

## 2020-01-19 ENCOUNTER — Ambulatory Visit (HOSPITAL_COMMUNITY)
Admission: RE | Admit: 2020-01-19 | Discharge: 2020-01-19 | Disposition: A | Discharge status: Home / Self Care | Payer: Medicare Other | Source: Ambulatory Visit | Attending: Cardiology | Admitting: Cardiology

## 2020-01-19 ENCOUNTER — Other Ambulatory Visit: Payer: Self-pay

## 2020-01-19 VITALS — BP 134/84 | HR 68 | Wt 148.4 lb

## 2020-01-19 DIAGNOSIS — Z7984 Long term (current) use of oral hypoglycemic drugs: Secondary | ICD-10-CM | POA: Insufficient documentation

## 2020-01-19 DIAGNOSIS — I5022 Chronic systolic (congestive) heart failure: Secondary | ICD-10-CM | POA: Insufficient documentation

## 2020-01-19 DIAGNOSIS — E1151 Type 2 diabetes mellitus with diabetic peripheral angiopathy without gangrene: Secondary | ICD-10-CM | POA: Insufficient documentation

## 2020-01-19 DIAGNOSIS — I110 Hypertensive heart disease with heart failure: Secondary | ICD-10-CM | POA: Insufficient documentation

## 2020-01-19 DIAGNOSIS — Z95810 Presence of automatic (implantable) cardiac defibrillator: Secondary | ICD-10-CM | POA: Diagnosis not present

## 2020-01-19 DIAGNOSIS — I428 Other cardiomyopathies: Secondary | ICD-10-CM | POA: Diagnosis not present

## 2020-01-19 DIAGNOSIS — Z79899 Other long term (current) drug therapy: Secondary | ICD-10-CM | POA: Diagnosis not present

## 2020-01-19 DIAGNOSIS — E785 Hyperlipidemia, unspecified: Secondary | ICD-10-CM | POA: Insufficient documentation

## 2020-01-19 MED ORDER — DAPAGLIFLOZIN PROPANEDIOL 10 MG PO TABS: 10.0000 mg | ORAL_TABLET | Freq: Every day | ORAL | 11 refills | Status: DC

## 2020-01-19 NOTE — Patient Instructions (Addendum)
It was a pleasure seeing you today!  MEDICATIONS: -We are changing your medications today -Start Farxiga (dapagliflozin) 10 mg (1 tablet) daily -Call if you have questions about your medications.   NEXT APPOINTMENT: Return to clinic in 1 month with Dr. McLean.  In general, to take care of your heart failure: -Limit your fluid intake to 2 Liters (half-gallon) per day.   -Limit your salt intake to ideally 2-3 grams (2000-3000 mg) per day. -Weigh yourself daily and record, and bring that "weight diary" to your next appointment.  (Weight gain of 2-3 pounds in 1 day typically means fluid weight.) -The medications for your heart are to help your heart and help you live longer.   -Please contact us before stopping any of your heart medications.  Call the clinic at [phone] with questions or to reschedule future appointments.  

## 2020-01-25 ENCOUNTER — Telehealth (HOSPITAL_COMMUNITY): Payer: Self-pay | Admitting: Pharmacist

## 2020-01-25 MED ORDER — EMPAGLIFLOZIN 10 MG PO TABS: 10.0000 mg | ORAL_TABLET | Freq: Every day | ORAL | 11 refills | Status: DC

## 2020-01-25 NOTE — Telephone Encounter (Signed)
Received call from patient that he has had 2 significant dizzy spells since starting Farxiga. He did not take his BP or BG during these episodes. Patient is willing to change [place] to [place]. Will have him discontinue Farxiga, wait 1 week, then start Jardiance 10 mg daily. Patient will call if he develops any more dizzy spells. Sent Jardiance prescription to [facility].    Audry Riles, PharmD, BCPS, BCCP, CPP Heart Failure Clinic Pharmacist [phone]

## 2020-02-02 ENCOUNTER — Ambulatory Visit (HOSPITAL_COMMUNITY)
Admission: RE | Admit: 2020-02-02 | Discharge: 2020-02-02 | Disposition: A | Discharge status: Home / Self Care | Payer: Medicare Other | Source: Ambulatory Visit | Attending: Cardiology | Admitting: Cardiology

## 2020-02-02 ENCOUNTER — Other Ambulatory Visit: Payer: Self-pay

## 2020-02-02 DIAGNOSIS — I5042 Chronic combined systolic (congestive) and diastolic (congestive) heart failure: Secondary | ICD-10-CM | POA: Diagnosis not present

## 2020-02-02 LAB — BASIC METABOLIC PANEL
Anion gap: 10 (ref 5–15)
BUN: 11 mg/dL (ref 8–23)
CO2: 28 mmol/L (ref 22–32)
Calcium: 9.2 mg/dL (ref 8.9–10.3)
Chloride: 100 mmol/L (ref 98–111)
Creatinine, Ser: 0.92 mg/dL (ref 0.61–1.24)
GFR calc Af Amer: >60 mL/min (ref >60)
GFR calc non Af Amer: >60 mL/min (ref >60)
Glucose, Bld: 180 mg/dL — ABNORMAL HIGH (ref 70–99)
Potassium: 3.7 mmol/L (ref 3.5–5.1)
Sodium: 138 mmol/L (ref 135–145)

## 2020-02-19 DIAGNOSIS — E785 Hyperlipidemia, unspecified: Secondary | ICD-10-CM | POA: Diagnosis not present

## 2020-02-19 LAB — LIPID PANEL
Chol/HDL Ratio: 2.3 ratio (ref 0.0–5.0)
Cholesterol, Total: 115 mg/dL (ref 100–199)
HDL: 50 mg/dL (ref >39)
LDL Chol Calc (NIH): 51 mg/dL (ref 0–99)
Triglycerides: 67 mg/dL (ref 0–149)
VLDL Cholesterol Cal: 14 mg/dL (ref 5–40)

## 2020-02-26 ENCOUNTER — Encounter: Payer: Self-pay | Admitting: Internal Medicine

## 2020-02-26 ENCOUNTER — Ambulatory Visit (INDEPENDENT_AMBULATORY_CARE_PROVIDER_SITE_OTHER): Payer: Medicare Other | Admitting: Internal Medicine

## 2020-02-26 ENCOUNTER — Other Ambulatory Visit: Payer: Self-pay

## 2020-02-26 VITALS — BP 103/57 | HR 58 | Ht 70.0 in | Wt 145.8 lb

## 2020-02-26 DIAGNOSIS — E785 Hyperlipidemia, unspecified: Secondary | ICD-10-CM

## 2020-02-26 DIAGNOSIS — I5042 Chronic combined systolic (congestive) and diastolic (congestive) heart failure: Secondary | ICD-10-CM

## 2020-02-26 DIAGNOSIS — I739 Peripheral vascular disease, unspecified: Secondary | ICD-10-CM | POA: Diagnosis not present

## 2020-02-26 DIAGNOSIS — I2510 Atherosclerotic heart disease of native coronary artery without angina pectoris: Secondary | ICD-10-CM | POA: Diagnosis not present

## 2020-02-26 NOTE — Progress Notes (Signed)
LIPID CLINIC CONSULT NOTE  Chief Complaint:  Manage dyslipidemia  Primary Care Physician: Jonathon Jordan, MD  Primary Cardiologist:  Sinclair Grooms, MD  HPI:  Nicholas Herrera is a 79 y.o. male who is being seen today for the evaluation of dyslipidemia at the request of Drs. Aundra Dubin and [place]. This is a pleasant 79 year old male who was living in [place] for a number of years.  He has a history of congestive heart failure with LVEF that recently is in the 20s.  He had an AICD placed in 123XX123 had some complications with the device in the past.  He also has PAD with prior peripheral interventions and is followed by Dr. Fletcher Anon.  He reports longstanding persistent dyslipidemia.  Fortunately he has been able to tolerate statins.  He currently is on rosuvastatin 40 mg daily however his cholesterol remains above target.  Repeat lipid ordered by Dr. Aundra Dubin indicated his total cholesterol was 177, triglycerides 142, HDL 60 and LDL of 89.  His target LDL is less than 70.  02/26/2020  [name] returns today for follow-up.  Overall he is doing well.  After adding ezetimibe to his regimen he says he does not feel any different.  He is also made some dietary changes and his weight is down about 3 pounds.  He was started on Jardiance.  He does not check his sugars regularly but believes this is helped with his blood sugars.  Lipids are improved with total cholesterol now 115, triglycerides 67, HDL 50 and LDL 51 which is at goal.  PMHx:  Past Medical History:  Diagnosis Date  . Abdominal distention   . Abdominal pain   . Bronchitis    hx of  . Cancer ([place])    skin  . CHF (congestive heart failure) (HCC)     (hx of EF 27%) s/p AICD 2004. Most recent LVEF > 40% , 8/11  . Chronic kidney disease    has small stones, no treatment  . Cough   . Depression    takes paxil  . Diverticulosis    hx of  . Dysrhythmia   . Easy bruising   . Hematuria    hx of  . Hernia october 2012   [facility]  . HTN  (hypertension)   . Hyperlipidemia    takes zocor  . Inguinal hernia   . Left groin pain     Past Surgical History:  Procedure Laterality Date  . ABDOMINAL AORTOGRAM W/LOWER EXTREMITY N/A 09/03/2018   Procedure: ABDOMINAL AORTOGRAM W/LOWER EXTREMITY;  Surgeon: Wellington Hampshire, MD;  Location: [place] [facility];  Service: Cardiovascular;  Laterality: N/A;  . ABDOMINAL AORTOGRAM W/LOWER EXTREMITY N/A 07/29/2019   Procedure: ABDOMINAL AORTOGRAM W/LOWER EXTREMITY;  Surgeon: Wellington Hampshire, MD;  Location: [place] [facility];  Service: Cardiovascular;  Laterality: N/A;  . APPENDECTOMY    . CARDIAC CATHETERIZATION    . CARDIAC DEFIBRILLATOR REMOVAL  2005  . HERNIA REPAIR  09/11/11   repair of LIH   . INGUINAL HERNIA REPAIR  09/11/2011   Procedure: HERNIA REPAIR INGUINAL ADULT;  Surgeon: Judieth Keens, DO;  Location: [place];  Service: General;  Laterality: Left;  open left inguinal hernia with mesh  . LOWER EXTREMITY ANGIOGRAM N/A 02/05/2013   Procedure: LOWER EXTREMITY ANGIOGRAM;  Surgeon: Jettie Booze, MD;  Location: [facility];  Service: Cardiovascular;  Laterality: N/A;  . PERIPHERAL VASCULAR BALLOON ANGIOPLASTY  07/29/2019   Procedure: PERIPHERAL VASCULAR BALLOON  ANGIOPLASTY;  Surgeon: Wellington Hampshire, MD;  Location: [place] [facility];  Service: Cardiovascular;;  . PERIPHERAL VASCULAR INTERVENTION Left 09/03/2018   Procedure: PERIPHERAL VASCULAR INTERVENTION;  Surgeon: Wellington Hampshire, MD;  Location: [place] [facility];  Service: Cardiovascular;  Laterality: Left;  . RIGHT/LEFT HEART CATH AND CORONARY ANGIOGRAPHY N/A 12/15/2019   Procedure: RIGHT/LEFT HEART CATH AND CORONARY ANGIOGRAPHY;  Surgeon: [name], MD;  Location: [place] [facility];  Service: Cardiovascular;  Laterality: N/A;    FAMHx:  Family History  Problem Relation Age of Onset  . Cancer Mother     SOCHx:   reports that he has been smoking cigars. He has a 48.00 pack-year smoking history. He has  never used smokeless tobacco. He reports current alcohol use of about 7.0 standard drinks of alcohol per week. He reports that he does not use drugs.  ALLERGIES:  Allergies  Allergen Reactions  . Sulfa Antibiotics Other (See Comments)    Unknown reaction - childhood allergy    ROS: Pertinent items noted in HPI and remainder of comprehensive ROS otherwise negative.  HOME MEDS: Current Outpatient Medications on File Prior to Visit  Medication Sig Dispense Refill  . aspirin (ASPIRIN CHILDRENS) 81 MG chewable tablet Chew 1 tablet (81 mg total) by mouth daily. 36 tablet 11  . carvedilol (COREG) 12.5 MG tablet Take 1.5 tablets (18.75 mg total) by mouth 2 (two) times daily with a meal. 90 tablet 3  . clopidogrel (PLAVIX) 75 MG tablet Take 1 tablet (75 mg total) by mouth daily with breakfast. 30 tablet 11  . dapagliflozin propanediol (FARXIGA) 10 MG TABS tablet Take 10 mg by mouth daily. 30 tablet 11  . diphenhydramine-acetaminophen (TYLENOL PM) 25-500 MG TABS tablet Take 2 tablets by mouth at bedtime.    . empagliflozin (JARDIANCE) 10 MG TABS tablet Take 10 mg by mouth daily before breakfast. 30 tablet 11  . FIBER PO Take 1 capsule by mouth 2 (two) times daily.    . mirabegron ER (MYRBETRIQ) 50 MG TB24 tablet Take 50 mg by mouth daily.    . Multiple Vitamins-Minerals (ONE-A-DAY MENS 50+ ADVANTAGE PO) Take 1 tablet by mouth daily.     [name] PARoxetine (PAXIL) 40 MG tablet Take 40 mg by mouth daily.    . rosuvastatin (CRESTOR) 40 MG tablet Take 1 tablet (40 mg total) by mouth daily. 90 tablet 3  . sacubitril-valsartan (ENTRESTO) 97-103 MG Take 1 tablet by mouth 2 (two) times daily. 60 tablet 5  . spironolactone (ALDACTONE) 25 MG tablet Take 1 tablet (25 mg total) by mouth daily. 90 tablet 3  . venlafaxine XR (EFFEXOR-XR) 75 MG 24 hr capsule Take 75 mg by mouth daily.    [name] ezetimibe (ZETIA) 10 MG tablet Take 1 tablet (10 mg total) by mouth daily. 90 tablet 3   No current facility-administered  medications on file prior to visit.    LABS/IMAGING: No results found for this or any previous visit (from the past 48 hour(s)). No results found.  LIPID PANEL:    Component Value Date/Time   CHOL 115 02/19/2020 0914   TRIG 67 02/19/2020 0914   HDL 50 02/19/2020 0914   CHOLHDL 2.3 02/19/2020 0914   CHOLHDL 3.0 11/20/2019 1231   VLDL 28 11/20/2019 1231   LDLCALC 51 02/19/2020 0914    WEIGHTS: Wt Readings from Last 3 Encounters:  02/26/20 145 lb 12.8 oz (66.1 kg)  01/19/20 148 lb 6.4 oz (67.3 kg)  12/28/19 149 lb 12.8  oz (67.9 kg)    VITALS: BP (!) 103/57   Pulse (!) 58   Ht 5\' 10"  (1.778 m)   Wt 145 lb 12.8 oz (66.1 kg)   SpO2 98%   BMI 20.92 kg/m   EXAM: Deferred  EKG: Deferred  ASSESSMENT: 1. Mixed dyslipidemia, goal LDL less than 70 2. Chronic systolic congestive heart failure 3. Status post AICD 4. PAD  PLAN: 1.   Mr. Freemon has achieved goal LDL less than 70 on combination ezetimibe and rosuvastatin.  He is also had a little weight loss and improvement likely related to dietary changes and the addition of Jardiance.  At this point I feel like he is optimally treated.  I am happy to see him back as needed if he became intolerant of the medications are needed further risk reduction otherwise I would continue his current medications and leave follow-up to the heart failure clinic.  Thanks again for allowing me to participate in his care.  Pixie Casino, MD, [facility], [place] Director of the Advanced Lipid Disorders &  Cardiovascular Risk Reduction Clinic Diplomate of the American Board of Clinical Lipidology Attending Cardiologist  Direct Dial: [phone]  Fax: [phone]  Website:  [email or web address] 02/26/2020, 11:02 AM

## 2020-02-26 NOTE — Patient Instructions (Signed)

## 2020-03-01 ENCOUNTER — Ambulatory Visit (HOSPITAL_COMMUNITY)
Admission: RE | Admit: 2020-03-01 | Discharge: 2020-03-01 | Disposition: A | Discharge status: Home / Self Care | Payer: Medicare Other | Source: Ambulatory Visit | Attending: Cardiology | Admitting: Cardiology

## 2020-03-01 ENCOUNTER — Other Ambulatory Visit: Payer: Self-pay

## 2020-03-01 ENCOUNTER — Encounter (HOSPITAL_COMMUNITY): Payer: Self-pay | Admitting: Cardiology

## 2020-03-01 ENCOUNTER — Ambulatory Visit: Payer: Medicare Other | Admitting: Cardiology

## 2020-03-01 VITALS — BP 104/64 | HR 66 | Wt 145.6 lb

## 2020-03-01 DIAGNOSIS — R42 Dizziness and giddiness: Secondary | ICD-10-CM | POA: Diagnosis not present

## 2020-03-01 DIAGNOSIS — Z7982 Long term (current) use of aspirin: Secondary | ICD-10-CM | POA: Diagnosis not present

## 2020-03-01 DIAGNOSIS — N183 Chronic kidney disease, stage 3 unspecified: Secondary | ICD-10-CM | POA: Diagnosis not present

## 2020-03-01 DIAGNOSIS — E1122 Type 2 diabetes mellitus with diabetic chronic kidney disease: Secondary | ICD-10-CM | POA: Insufficient documentation

## 2020-03-01 DIAGNOSIS — I130 Hypertensive heart and chronic kidney disease with heart failure and stage 1 through stage 4 chronic kidney disease, or unspecified chronic kidney disease: Secondary | ICD-10-CM | POA: Insufficient documentation

## 2020-03-01 DIAGNOSIS — E785 Hyperlipidemia, unspecified: Secondary | ICD-10-CM | POA: Insufficient documentation

## 2020-03-01 DIAGNOSIS — Z79899 Other long term (current) drug therapy: Secondary | ICD-10-CM | POA: Diagnosis not present

## 2020-03-01 DIAGNOSIS — I2510 Atherosclerotic heart disease of native coronary artery without angina pectoris: Secondary | ICD-10-CM | POA: Diagnosis not present

## 2020-03-01 DIAGNOSIS — Z95810 Presence of automatic (implantable) cardiac defibrillator: Secondary | ICD-10-CM | POA: Diagnosis not present

## 2020-03-01 DIAGNOSIS — I428 Other cardiomyopathies: Secondary | ICD-10-CM | POA: Diagnosis not present

## 2020-03-01 DIAGNOSIS — I5022 Chronic systolic (congestive) heart failure: Secondary | ICD-10-CM | POA: Diagnosis not present

## 2020-03-01 DIAGNOSIS — F17210 Nicotine dependence, cigarettes, uncomplicated: Secondary | ICD-10-CM | POA: Insufficient documentation

## 2020-03-01 DIAGNOSIS — M79604 Pain in right leg: Secondary | ICD-10-CM | POA: Diagnosis not present

## 2020-03-01 DIAGNOSIS — Z7984 Long term (current) use of oral hypoglycemic drugs: Secondary | ICD-10-CM | POA: Diagnosis not present

## 2020-03-01 DIAGNOSIS — I5042 Chronic combined systolic (congestive) and diastolic (congestive) heart failure: Secondary | ICD-10-CM

## 2020-03-01 LAB — BASIC METABOLIC PANEL WITH GFR
Anion gap: 7 (ref 5–15)
BUN: 12 mg/dL (ref 8–23)
CO2: 26 mmol/L (ref 22–32)
Calcium: 9 mg/dL (ref 8.9–10.3)
Chloride: 104 mmol/L (ref 98–111)
Creatinine, Ser: 1 mg/dL (ref 0.61–1.24)
GFR calc Af Amer: >60 mL/min
GFR calc non Af Amer: >60 mL/min
Glucose, Bld: 108 mg/dL — ABNORMAL HIGH (ref 70–99)
Potassium: 4.4 mmol/L (ref 3.5–5.1)
Sodium: 137 mmol/L (ref 135–145)

## 2020-03-01 MED ORDER — CARVEDILOL 12.5 MG PO TABS: 12.5000 mg | ORAL_TABLET | Freq: Two times a day (BID) | ORAL | 5 refills | Status: DC

## 2020-03-01 MED ORDER — SPIRONOLACTONE 25 MG PO TABS: 25.0000 mg | ORAL_TABLET | Freq: Every evening | ORAL | 3 refills | Status: DC

## 2020-03-01 NOTE — Progress Notes (Signed)
PCP: Jonathon Jordan, MD Cardiology: Dr. Tamala Julian HF Cardiology: Dr. Aundra Dubin  79 y.o. with PAD, smoking, and a long history of cardiomyopathy was referred by Dr. Tamala Julian for evaluation of CHF/cardiomyopathy.  Patient was diagnosed with a cardiomyopathy prior to 2004 while he was living in [place].  He had an ICD placed in 2004.  Per notes, EF was < 30% during this time.  He thinks he may have had a heart catheterization but does not remember the details.  The ICD subsequently malfunctioned and was turned off in 2009.  It is still implanted and has 2 nonfunctioning leads.  Echo in 2011 after moving the [facility] showed improved EF at 40-45%.  He did not have an echo over the next 9 years.  However, Echo done in 7/20 showed EF down to 20-25% with a moderately dilated LV.  He has been subsequently started on HF meds.  He saw Dr. Lovena Le to decide about explantation of old ICD/implantation of new ICD.  He was thought to be not a candidate for a new intravascular ICD.  Subcutaneous ICD would be a potential option but utility would like be limited with nonischemic cardiomyopathy at age 89.   Patient also has an extensive PAD history.  He still smokes 1/2-1 ppd.  His last peripheral vascular intervention was in 9/20 with PTCA to left external and common iliac arteries.  Currently, he gets right calf claudication after walking longer distances.    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  He was well-compensated.   He returns today for followup of CHF.  Generally doing well but gets lightheaded if he stands up too fast.  Yesterday got lightheaded and almost fell after he stood up, SBP in 90s when he checked it after sitting back down. Still gets right leg claudication with heavy exertion (bush hogging), he does not get claudication with walking.  No chest pain.  No significant exertional dyspnea.  No orthopnea/PND.  Still smoking.   Labs (2/20): LDL 93 Labs (11/20): K 4, creatinine  0.99  Labs (1/21): LDL 89 Labs (2/21): K 4.2, creatinine 0.97 Labs (4/21): LDL 51, HDL 50, K 3.7, creatinine 0.92  PMH:  1. HTN 2. Hyperlipidemia 3. Active smoker 4. PAD: Right popliteal stent in 2014 with subsequent occlusion.   - Orbital atherectomy + drug coated balloon angioplasty of left SFA in 2019.   - In 9/20, had PTCA left external and common iliac arteries.  5. CKD: Stage 3.  6. Cardiomyopathy: Patient had an ICD placed in [place] in 2004, so presumably had a pre-dating cardiomyopathy.  He thinks that he had a heart cath in [place] but is not sure.  EF <30% when he moved to [place].  - Echo (2011) with EF improved to 40-45%.  - Echo (7/20): EF 20-25%, moderate LV dilation, normal RV size and systolic function.  - Patient has 2 nonfunctioning ICD leads (ICD is not functional, turned off in 2009).  - LHC/RHC (2/21): Nonobstructive CAD.  Mean RA 3, PA 25/5, mean PCWP 7, CI 2.4.  7. Type 2 diabetes  SH: Retired 15 years ago from [employer].  Married with 3 children, smokes about 1/2-1 ppd still, has had about 3 drinks/day for 2-3 years.   FH: Mother with cancer, father with COPD.  No heart disease that he knows of.   ROS: All systems reviewed and negative except as per HPI.   Current Outpatient Medications  Medication Sig Dispense Refill  . aspirin (ASPIRIN  CHILDRENS) 81 MG chewable tablet Chew 1 tablet (81 mg total) by mouth daily. 36 tablet 11  . carvedilol (COREG) 12.5 MG tablet Take 1 tablet (12.5 mg total) by mouth 2 (two) times daily with a meal. 60 tablet 5  . clopidogrel (PLAVIX) 75 MG tablet Take 1 tablet (75 mg total) by mouth daily with breakfast. 30 tablet 11  . dapagliflozin propanediol (FARXIGA) 10 MG TABS tablet Take 10 mg by mouth daily. 30 tablet 11  . diphenhydramine-acetaminophen (TYLENOL PM) 25-500 MG TABS tablet Take 2 tablets by mouth at bedtime.    . empagliflozin (JARDIANCE) 10 MG TABS tablet Take 10 mg by mouth daily before breakfast. 30 tablet  11  . ezetimibe (ZETIA) 10 MG tablet Take 1 tablet (10 mg total) by mouth daily. 90 tablet 3  . FIBER PO Take 1 capsule by mouth 2 (two) times daily.    . mirabegron ER (MYRBETRIQ) 50 MG TB24 tablet Take 50 mg by mouth daily.    . Multiple Vitamins-Minerals (ONE-A-DAY MENS 50+ ADVANTAGE PO) Take 1 tablet by mouth daily.     [name] PARoxetine (PAXIL) 40 MG tablet Take 40 mg by mouth daily.    . rosuvastatin (CRESTOR) 40 MG tablet Take 1 tablet (40 mg total) by mouth daily. 90 tablet 3  . sacubitril-valsartan (ENTRESTO) 97-103 MG Take 1 tablet by mouth 2 (two) times daily. 60 tablet 5  . spironolactone (ALDACTONE) 25 MG tablet Take 1 tablet (25 mg total) by mouth at bedtime. 90 tablet 3  . venlafaxine XR (EFFEXOR-XR) 75 MG 24 hr capsule Take 75 mg by mouth daily.     No current facility-administered medications for this encounter.   BP 104/64   Pulse 66   Wt 66 kg (145 lb 9.6 oz)   SpO2 98%   BMI 20.89 kg/m  General: NAD Neck: No JVD, no thyromegaly or thyroid nodule.  Lungs: Clear to auscultation bilaterally with normal respiratory effort. CV: Nondisplaced PMI.  Heart regular S1/S2, no S3/S4, no murmur.  No peripheral edema.  No carotid bruit.  Feet warm but unable to palpate pedal pulses.  Abdomen: Soft, nontender, no hepatosplenomegaly, no distention.  Skin: Intact without lesions or rashes.  Neurologic: Alert and oriented x 3.  Psych: Normal affect. Extremities: No clubbing or cyanosis.  HEENT: Normal.   Assessment/Plan: 1. Chronic systolic CHF:  Patient has had a known cardiomyopathy since prior to 2004.  EF had improved to 40-45% in 2011 but had dropped significantly on echo in 7/20 with EF 20-25%. He has a nonfunctioning ICD.  Cause of the cardiomyopathy is uncertain.  Coronary angiography in 2/21 showed no significant coronary disease. He has no family history of cardiomyopathy.  While he drinks about 3 alcoholic drinks/day currently, he did not drink regularly prior to 2-3 years ago  so I do not think that ETOH explains his cardiomyopathy. RHC in 2/21 showed normal filling pressures and normal cardiac output.  NYHA class II symptoms, he is not volume overloaded on exam.  Weight down 4 lbs.  He gets lightheaded with standing at times and has almost fallen.  - With orthostasis, decrease Coreg to 12.5 mg bid and will have him take spironolactone at night rather than in the morning.  - Continue Entresto 97/103 bid.    - Continue Jardiance 10 mg daily.  - He does not appear to need Lasix.  - Patient is not a candidate for another intravascular ICD.  Limited utility to implanting subcutaneous ICD in the setting  of a nonischemic cardiomyopathy.   2. PAD: Right leg pain with heavy exertion, not with walking.  No ulcers or rest pain.  Followed by Dr. Fletcher Anon.  - Stay active.  - Continue ASA 81 and Crestor 40 mg daily + Zetia 10 mg daily.  - Needs to quit smoking => discussed with patient again today.  3. HTN: BP on the lower side with orthostasis, cutting back on Coreg today.  4. Hyperlipidemia: Good lipids on Zetia/Crestor in 4/21.    Followup in 4 months.   Loralie Champagne 03/01/2020

## 2020-03-01 NOTE — Patient Instructions (Addendum)
CHANGE the time you take Spironolactone. Take one tab at night   DECREASE Coreg to 12.5mg  (1 tab) twice a day   Your physician recommends that you schedule a follow-up appointment in: 4 months with Dr Aundra Dubin    Next appointment: Wednesday September 1st, 2021 at 11AM   Please call office at [phone] option 2 if you have any questions or concerns.    At the [facility], you and your health needs are our priority. As part of our continuing mission to provide you with exceptional heart care, we have created designated Provider Care Teams. These Care Teams include your primary Cardiologist (physician) and Advanced Practice Providers (APPs- Physician Assistants and Nurse Practitioners) who all work together to provide you with the care you need, when you need it.   You may see any of the following providers on your designated Care Team at your next follow up: [name] Dr Glori Bickers . Dr Loralie Champagne . Darrick Grinder, NP . Lyda Jester, PA . Audry Riles, PharmD   Please be sure to bring in all your medications bottles to every appointment.

## 2020-03-10 ENCOUNTER — Other Ambulatory Visit: Payer: Self-pay

## 2020-03-10 ENCOUNTER — Ambulatory Visit (HOSPITAL_COMMUNITY)
Admission: RE | Admit: 2020-03-10 | Discharge: 2020-03-10 | Disposition: A | Discharge status: Home / Self Care | Payer: Medicare Other | Source: Ambulatory Visit | Attending: Cardiology | Admitting: Cardiology

## 2020-03-10 ENCOUNTER — Other Ambulatory Visit (HOSPITAL_COMMUNITY): Payer: Self-pay | Admitting: Cardiovascular Disease

## 2020-03-10 ENCOUNTER — Ambulatory Visit (HOSPITAL_BASED_OUTPATIENT_CLINIC_OR_DEPARTMENT_OTHER)
Admission: RE | Admit: 2020-03-10 | Discharge: 2020-03-10 | Disposition: A | Discharge status: Home / Self Care | Payer: Medicare Other | Source: Ambulatory Visit | Attending: Cardiovascular Disease | Admitting: Cardiovascular Disease

## 2020-03-10 DIAGNOSIS — I739 Peripheral vascular disease, unspecified: Secondary | ICD-10-CM

## 2020-03-15 ENCOUNTER — Encounter: Payer: Self-pay | Admitting: Cardiovascular Disease

## 2020-03-15 ENCOUNTER — Other Ambulatory Visit: Payer: Self-pay

## 2020-03-15 ENCOUNTER — Ambulatory Visit (INDEPENDENT_AMBULATORY_CARE_PROVIDER_SITE_OTHER): Payer: Medicare Other | Admitting: Cardiovascular Disease

## 2020-03-15 ENCOUNTER — Telehealth: Payer: Self-pay | Admitting: Cardiovascular Disease

## 2020-03-15 VITALS — BP 124/78 | HR 58 | Temp 97.0°F | Ht 70.0 in | Wt 142.4 lb

## 2020-03-15 DIAGNOSIS — Z720 Tobacco use: Secondary | ICD-10-CM | POA: Diagnosis not present

## 2020-03-15 DIAGNOSIS — I739 Peripheral vascular disease, unspecified: Secondary | ICD-10-CM | POA: Diagnosis not present

## 2020-03-15 DIAGNOSIS — E785 Hyperlipidemia, unspecified: Secondary | ICD-10-CM | POA: Diagnosis not present

## 2020-03-15 DIAGNOSIS — Z01818 Encounter for other preprocedural examination: Secondary | ICD-10-CM | POA: Diagnosis not present

## 2020-03-15 DIAGNOSIS — I10 Essential (primary) hypertension: Secondary | ICD-10-CM

## 2020-03-15 DIAGNOSIS — I5022 Chronic systolic (congestive) heart failure: Secondary | ICD-10-CM | POA: Diagnosis not present

## 2020-03-15 DIAGNOSIS — I2510 Atherosclerotic heart disease of native coronary artery without angina pectoris: Secondary | ICD-10-CM

## 2020-03-15 LAB — CBC
Hematocrit: 45 % (ref 37.5–51.0)
Hemoglobin: 14.5 g/dL (ref 13.0–17.7)
MCH: 31.7 pg (ref 26.6–33.0)
MCHC: 32.2 g/dL (ref 31.5–35.7)
MCV: 99 fL — ABNORMAL HIGH (ref 79–97)
Platelets: 187 10*3/uL (ref 150–450)
RBC: 4.57 x10E6/uL (ref 4.14–5.80)
RDW: 12.7 % (ref 11.6–15.4)
WBC: 9.3 10*3/uL (ref 3.4–10.8)

## 2020-03-15 NOTE — Patient Instructions (Addendum)
Medication Instructions:  No changes *If you need a refill on your cardiac medications before your next appointment, please call your pharmacy*   Lab Work: Your provider would like for you to have the following labs today: CBC  If you have labs (blood work) drawn today and your tests are completely normal, you will receive your results only by: [name] MyChart Message (if you have MyChart) OR . A paper copy in the mail If you have any lab test that is abnormal or we need to change your treatment, we will call you to review the results.   Testing/Procedures: Your physician has requested that you have a peripheral vascular angiogram. This exam is performed at the hospital. During this exam IV contrast is used to look at arterial blood flow. Please review the information sheet given for details.  Follow-Up: At [facility], you and your health needs are our priority.  As part of our continuing mission to provide you with exceptional heart care, we have created designated Provider Care Teams.  These Care Teams include your primary Cardiologist (physician) and Advanced Practice Providers (APPs -  Physician Assistants and Nurse Practitioners) who all work together to provide you with the care you need, when you need it.  We recommend signing up for the patient portal called "MyChart".  Sign up information is provided on this After Visit Summary.  MyChart is used to connect with patients for Virtual Visits (Telemedicine).  Patients are able to view lab/test results, encounter notes, upcoming appointments, etc.  Non-urgent messages can be sent to your provider as well.   To learn more about what you can do with MyChart, go to [email or web address].    Your next appointment:   Keep your post procedure follow up with Dr. Fletcher Anon on 04/19/20 at 8:20 am   Other Instructions    [address] [place]  [place] [address] Dept: [phone] Loc: [place]  03/15/2020  You are scheduled for a Peripheral Angiogram on Wednesday, May 26 with Dr. Kathlyn Sacramento.  1. Please arrive at the [facility] (Main Entrance A) at [facility]: [address] [place], [place] [address] at 6:30 AM (This time is two hours before your procedure to ensure your preparation). Free valet parking service is available.   Special note: Every effort is made to have your procedure done on time. Please understand that emergencies sometimes delay scheduled procedures.  2. Diet: Do not eat solid foods after midnight.  The patient may have clear liquids until 5am upon the day of the procedure.   3. Labs: You will need to have blood drawn today: CBC  You will need to have the coronavirus test completed prior to your procedure. An appointment has been made at 10:50 am on 03/19/20. This is a Drive Up Visit at the [facility] [address]. Someone will direct you to the appropriate testing line. Please tell them that you are there for procedure testing. Stay in your car and someone will be with you shortly. Please make sure to have all other labs completed before this test because you will need to stay quarantined until your procedure.  4. Medication instructions in preparation for your procedure: Hold the Jardiance, Dapaglilozin all diabetic medication the morning of the procedure.  Hold the Spironolactone the morning of the procedure.   On the morning of your procedure, take your Aspirin and Plavix/Clopidogrel and any  morning medicines NOT listed above.  You may use sips of water.  5. Plan for one night stay--bring personal belongings. 6. Bring a current list of your medications and current insurance cards. 7. You MUST have a responsible person to drive you home. 8. Someone MUST be with you the first 24 hours after you arrive home or your discharge will be delayed. 9. Please wear clothes  that are easy to get on and off and wear slip-on shoes.  Thank you for allowing [place] to care for you!   -- [facility]

## 2020-03-15 NOTE — Telephone Encounter (Signed)
Pt state he just wanted to notify Dr. Fletcher Anon that his PCP d/c farxiga and he is now taking Jardiance 10 mg daily.  Will route to MD to make aware.

## 2020-03-15 NOTE — Telephone Encounter (Signed)
ok 

## 2020-03-15 NOTE — Progress Notes (Signed)
Cardiology Office Note   Date:  03/15/2020   ID:  KARANVEER LEMBERG, DOB 06-17-1941, MRN [id number]  PCP:  Jonathon Jordan, MD  Cardiologist:  Dr. Tamala Julian  No chief complaint on file.     History of Present Illness: MALEKI AAGARD is a 79 y.o. male who is here today for a follow-up visit regarding peripheral arterial disease. He has known history of coronary artery disease, chronic systolic/diastolic heart failure, tobacco use and hypertension. He has known history of peripheral arterial disease with previous revascularization of the right proximal popliteal artery using a 6 x 100 mm self-expanding stent in 2014. Subsequent studies showed occluded right popliteal artery stent with 2 vessel runoff below the knee and mildly reduced ABI. He was seen in 2019 for severe left calf claudication. Angiography showed focal occlusion in the distal left SFA which was heavily calcified.  It was treated successfully with orbital atherectomy and drug-coated balloon angioplasty.      He developed recurrent left calf claudication in 2020.  Doppler studies  showed patent left SFA.  However, there was new severe stenosis in the distal left common iliac artery extending into the external iliac artery with velocities above 600.  Angiography in September 2020 showed moderate right common iliac, external iliac and common femoral artery disease.  On the left, there was severe stenosis affecting the distal common iliac artery and distal external iliac artery.  I performed successful drug-coated balloon angioplasty to the left external and common iliac arteries.  Postprocedure ABI improved to normal on the left side.  He had resolution of left calf claudication at that time.  However, over the last few months, his right calf claudication has worsened significantly and this has significantly affected his ability to perform activities of daily living especially if he goes any incline..  No rest pain or lower extremity  ulceration.  Recent vascular studies showed normal ABI on the left and moderately reduced on the right at 0.58.  Aortoiliac duplex showed significant stenosis in the distal right common iliac artery and borderline stenosis in the external iliac artery  He has not been able to quit smoking.   Past Medical History:  Diagnosis Date  . Abdominal distention   . Abdominal pain   . Bronchitis    hx of  . Cancer ([place])    skin  . CHF (congestive heart failure) (HCC)     (hx of EF 27%) s/p AICD 2004. Most recent LVEF > 40% , 8/11  . Chronic kidney disease    has small stones, no treatment  . Cough   . Depression    takes paxil  . Diverticulosis    hx of  . Dysrhythmia   . Easy bruising   . Hematuria    hx of  . Hernia october 2012   [facility]  . HTN (hypertension)   . Hyperlipidemia    takes zocor  . Inguinal hernia   . Left groin pain     Past Surgical History:  Procedure Laterality Date  . ABDOMINAL AORTOGRAM W/LOWER EXTREMITY N/A 09/03/2018   Procedure: ABDOMINAL AORTOGRAM W/LOWER EXTREMITY;  Surgeon: Wellington Hampshire, MD;  Location: [place] [facility];  Service: Cardiovascular;  Laterality: N/A;  . ABDOMINAL AORTOGRAM W/LOWER EXTREMITY N/A 07/29/2019   Procedure: ABDOMINAL AORTOGRAM W/LOWER EXTREMITY;  Surgeon: Wellington Hampshire, MD;  Location: [place] [facility];  Service: Cardiovascular;  Laterality: N/A;  . APPENDECTOMY    . CARDIAC CATHETERIZATION    .  CARDIAC DEFIBRILLATOR REMOVAL  2005  . HERNIA REPAIR  09/11/11   repair of LIH   . INGUINAL HERNIA REPAIR  09/11/2011   Procedure: HERNIA REPAIR INGUINAL ADULT;  Surgeon: Judieth Keens, DO;  Location: [place];  Service: General;  Laterality: Left;  open left inguinal hernia with mesh  . LOWER EXTREMITY ANGIOGRAM N/A 02/05/2013   Procedure: LOWER EXTREMITY ANGIOGRAM;  Surgeon: Jettie Booze, MD;  Location: [facility] CATH LAB;  Service: Cardiovascular;  Laterality: N/A;  . PERIPHERAL VASCULAR BALLOON ANGIOPLASTY  07/29/2019    Procedure: PERIPHERAL VASCULAR BALLOON ANGIOPLASTY;  Surgeon: Wellington Hampshire, MD;  Location: [place] [facility];  Service: Cardiovascular;;  . PERIPHERAL VASCULAR INTERVENTION Left 09/03/2018   Procedure: PERIPHERAL VASCULAR INTERVENTION;  Surgeon: Wellington Hampshire, MD;  Location: [place] [facility];  Service: Cardiovascular;  Laterality: Left;  . RIGHT/LEFT HEART CATH AND CORONARY ANGIOGRAPHY N/A 12/15/2019   Procedure: RIGHT/LEFT HEART CATH AND CORONARY ANGIOGRAPHY;  Surgeon: [name], MD;  Location: [place] [facility];  Service: Cardiovascular;  Laterality: N/A;     Current Outpatient Medications  Medication Sig Dispense Refill  . aspirin (ASPIRIN CHILDRENS) 81 MG chewable tablet Chew 1 tablet (81 mg total) by mouth daily. 36 tablet 11  . carvedilol (COREG) 12.5 MG tablet Take 1 tablet (12.5 mg total) by mouth 2 (two) times daily with a meal. 60 tablet 5  . clopidogrel (PLAVIX) 75 MG tablet Take 1 tablet (75 mg total) by mouth daily with breakfast. 30 tablet 11  . dapagliflozin propanediol (FARXIGA) 10 MG TABS tablet Take 10 mg by mouth daily. 30 tablet 11  . diphenhydramine-acetaminophen (TYLENOL PM) 25-500 MG TABS tablet Take 2 tablets by mouth at bedtime.    . empagliflozin (JARDIANCE) 10 MG TABS tablet Take 10 mg by mouth daily before breakfast. 30 tablet 11  . FIBER PO Take 1 capsule by mouth 2 (two) times daily.    . mirabegron ER (MYRBETRIQ) 50 MG TB24 tablet Take 50 mg by mouth daily.    . Multiple Vitamins-Minerals (ONE-A-DAY MENS 50+ ADVANTAGE PO) Take 1 tablet by mouth daily.     [name] PARoxetine (PAXIL) 40 MG tablet Take 40 mg by mouth daily.    . rosuvastatin (CRESTOR) 40 MG tablet Take 1 tablet (40 mg total) by mouth daily. 90 tablet 3  . sacubitril-valsartan (ENTRESTO) 97-103 MG Take 1 tablet by mouth 2 (two) times daily. 60 tablet 5  . spironolactone (ALDACTONE) 25 MG tablet Take 1 tablet (25 mg total) by mouth at bedtime. 90 tablet 3  . venlafaxine XR (EFFEXOR-XR) 75 MG  24 hr capsule Take 75 mg by mouth daily.    [name] ezetimibe (ZETIA) 10 MG tablet Take 1 tablet (10 mg total) by mouth daily. 90 tablet 3   No current facility-administered medications for this visit.    Allergies:   Sulfa antibiotics    Social History:  The patient  reports that he has been smoking cigars. He has a 48.00 pack-year smoking history. He has never used smokeless tobacco. He reports current alcohol use of about 7.0 standard drinks of alcohol per week. He reports that he does not use drugs.   Family History:  The patient's family history includes Cancer in his mother.    ROS:  Please see the history of present illness.   Otherwise, review of systems are positive for none.   All other systems are reviewed and negative.    PHYSICAL EXAM: VS:  BP 124/78  Pulse (!) 58   Temp (!) 97 F (36.1 C)   Ht 5\' 10"  (1.778 m)   Wt 142 lb 6.4 oz (64.6 kg)   SpO2 94%   BMI 20.43 kg/m  , BMI Body mass index is 20.43 kg/m. GEN: Well nourished, well developed, in no acute distress  HEENT: normal  Neck: no JVD, carotid bruits, or masses Cardiac: RRR; no murmurs, rubs, or gallops,no edema  Respiratory:  clear to auscultation bilaterally, normal work of breathing GI: soft, nontender, nondistended, + BS MS: no deformity or atrophy  Skin: warm and dry, no rash Neuro:  Strength and sensation are intact Psych: euthymic mood, full affect Vascular: Femoral pulse: Barely palpable on the right side and +1 on the left side.  EKG:  EKG is not ordered today.   Recent Labs: 11/20/2019: Platelets 213 12/15/2019: Hemoglobin 12.9; Hemoglobin 12.9 03/01/2020: BUN 12; Creatinine, Ser 1.00; Potassium 4.4; Sodium 137    Lipid Panel    Component Value Date/Time   CHOL 115 02/19/2020 0914   TRIG 67 02/19/2020 0914   HDL 50 02/19/2020 0914   CHOLHDL 2.3 02/19/2020 0914   CHOLHDL 3.0 11/20/2019 1231   VLDL 28 11/20/2019 1231   LDLCALC 51 02/19/2020 0914      Wt Readings from Last 3 Encounters:   03/15/20 142 lb 6.4 oz (64.6 kg)  03/01/20 145 lb 9.6 oz (66 kg)  02/26/20 145 lb 12.8 oz (66.1 kg)       No flowsheet data found.    ASSESSMENT AND PLAN:   1.  Peripheral arterial disease: Status post endovascular intervention on the left lower extremity with no recurrent claudication.  However, he is now having severe right calf claudication.  He is known to have an occluded stent in the right popliteal artery.  However, it appears that there is likely worsening of his right iliac disease that is responsible for worsening symptoms over the last few months.  Given significant limitations and severe symptoms, I recommend proceeding with abdominal aortogram with lower extremity runoff and possible endovascular intervention.  Planned access is via the right common iliac artery in order to be able to treat the common iliac artery.  Given heavy calcifications, should consider atherectomy.   2. Tobacco use: I again discussed with him the importance of smoking cessation but he reports inability to quit.  3. Hyperlipidemia: The patient was switched to high-dose rosuvastatin with improvement in LDL to 93.  4. Coronary artery disease: No anginal symptoms.   5. Chronic systolic heart failure: He is followed by the heart failure clinic with improvement in symptoms after adjusting his medications.  6.  Essential hypertension: Blood pressure is reasonably controlled on current medications    Disposition:   FU with me in 1 months  Signed,  Kathlyn Sacramento, MD  03/15/2020 8:25 AM    [place]

## 2020-03-15 NOTE — H&P (View-Only) (Signed)
Cardiology Office Note   Date:  03/15/2020   ID:  Nicholas Herrera, DOB 1941/03/23, MRN [id number]  PCP:  Jonathon Jordan, MD  Cardiologist:  Dr. Tamala Julian  No chief complaint on file.     History of Present Illness: Nicholas Herrera is a 79 y.o. male who is here today for a follow-up visit regarding peripheral arterial disease. He has known history of coronary artery disease, chronic systolic/diastolic heart failure, tobacco use and hypertension. He has known history of peripheral arterial disease with previous revascularization of the right proximal popliteal artery using a 6 x 100 mm self-expanding stent in 2014. Subsequent studies showed occluded right popliteal artery stent with 2 vessel runoff below the knee and mildly reduced ABI. He was seen in 2019 for severe left calf claudication. Angiography showed focal occlusion in the distal left SFA which was heavily calcified.  It was treated successfully with orbital atherectomy and drug-coated balloon angioplasty.      He developed recurrent left calf claudication in 2020.  Doppler studies  showed patent left SFA.  However, there was new severe stenosis in the distal left common iliac artery extending into the external iliac artery with velocities above 600.  Angiography in September 2020 showed moderate right common iliac, external iliac and common femoral artery disease.  On the left, there was severe stenosis affecting the distal common iliac artery and distal external iliac artery.  I performed successful drug-coated balloon angioplasty to the left external and common iliac arteries.  Postprocedure ABI improved to normal on the left side.  He had resolution of left calf claudication at that time.  However, over the last few months, his right calf claudication has worsened significantly and this has significantly affected his ability to perform activities of daily living especially if he goes any incline..  No rest pain or lower extremity  ulceration.  Recent vascular studies showed normal ABI on the left and moderately reduced on the right at 0.58.  Aortoiliac duplex showed significant stenosis in the distal right common iliac artery and borderline stenosis in the external iliac artery  He has not been able to quit smoking.   Past Medical History:  Diagnosis Date  . Abdominal distention   . Abdominal pain   . Bronchitis    hx of  . Cancer ([place])    skin  . CHF (congestive heart failure) (HCC)     (hx of EF 27%) s/p AICD 2004. Most recent LVEF > 40% , 8/11  . Chronic kidney disease    has small stones, no treatment  . Cough   . Depression    takes paxil  . Diverticulosis    hx of  . Dysrhythmia   . Easy bruising   . Hematuria    hx of  . Hernia october 2012   [facility]  . HTN (hypertension)   . Hyperlipidemia    takes zocor  . Inguinal hernia   . Left groin pain     Past Surgical History:  Procedure Laterality Date  . ABDOMINAL AORTOGRAM W/LOWER EXTREMITY N/A 09/03/2018   Procedure: ABDOMINAL AORTOGRAM W/LOWER EXTREMITY;  Surgeon: Wellington Hampshire, MD;  Location: [place] [facility];  Service: Cardiovascular;  Laterality: N/A;  . ABDOMINAL AORTOGRAM W/LOWER EXTREMITY N/A 07/29/2019   Procedure: ABDOMINAL AORTOGRAM W/LOWER EXTREMITY;  Surgeon: Wellington Hampshire, MD;  Location: [place] [facility];  Service: Cardiovascular;  Laterality: N/A;  . APPENDECTOMY    . CARDIAC CATHETERIZATION    .  CARDIAC DEFIBRILLATOR REMOVAL  2005  . HERNIA REPAIR  09/11/11   repair of LIH   . INGUINAL HERNIA REPAIR  09/11/2011   Procedure: HERNIA REPAIR INGUINAL ADULT;  Surgeon: Judieth Keens, DO;  Location: [place];  Service: General;  Laterality: Left;  open left inguinal hernia with mesh  . LOWER EXTREMITY ANGIOGRAM N/A 02/05/2013   Procedure: LOWER EXTREMITY ANGIOGRAM;  Surgeon: Jettie Booze, MD;  Location: [facility] C[facility];  Service: Cardiovascular;  Laterality: N/A;  . PERIPHERAL VASCULAR BALLOON ANGIOPLASTY  07/29/2019    Procedure: PERIPHERAL VASCULAR BALLOON ANGIOPLASTY;  Surgeon: Wellington Hampshire, MD;  Location: [place] [facility];  Service: Cardiovascular;;  . PERIPHERAL VASCULAR INTERVENTION Left 09/03/2018   Procedure: PERIPHERAL VASCULAR INTERVENTION;  Surgeon: Wellington Hampshire, MD;  Location: [place] [facility];  Service: Cardiovascular;  Laterality: Left;  . RIGHT/LEFT HEART CATH AND CORONARY ANGIOGRAPHY N/A 12/15/2019   Procedure: RIGHT/LEFT HEART CATH AND CORONARY ANGIOGRAPHY;  Surgeon: [name], MD;  Location: [place] [facility];  Service: Cardiovascular;  Laterality: N/A;     Current Outpatient Medications  Medication Sig Dispense Refill  . aspirin (ASPIRIN CHILDRENS) 81 MG chewable tablet Chew 1 tablet (81 mg total) by mouth daily. 36 tablet 11  . carvedilol (COREG) 12.5 MG tablet Take 1 tablet (12.5 mg total) by mouth 2 (two) times daily with a meal. 60 tablet 5  . clopidogrel (PLAVIX) 75 MG tablet Take 1 tablet (75 mg total) by mouth daily with breakfast. 30 tablet 11  . dapagliflozin propanediol (FARXIGA) 10 MG TABS tablet Take 10 mg by mouth daily. 30 tablet 11  . diphenhydramine-acetaminophen (TYLENOL PM) 25-500 MG TABS tablet Take 2 tablets by mouth at bedtime.    . empagliflozin (JARDIANCE) 10 MG TABS tablet Take 10 mg by mouth daily before breakfast. 30 tablet 11  . FIBER PO Take 1 capsule by mouth 2 (two) times daily.    . mirabegron ER (MYRBETRIQ) 50 MG TB24 tablet Take 50 mg by mouth daily.    . Multiple Vitamins-Minerals (ONE-A-DAY MENS 50+ ADVANTAGE PO) Take 1 tablet by mouth daily.     [name] PARoxetine (PAXIL) 40 MG tablet Take 40 mg by mouth daily.    . rosuvastatin (CRESTOR) 40 MG tablet Take 1 tablet (40 mg total) by mouth daily. 90 tablet 3  . sacubitril-valsartan (ENTRESTO) 97-103 MG Take 1 tablet by mouth 2 (two) times daily. 60 tablet 5  . spironolactone (ALDACTONE) 25 MG tablet Take 1 tablet (25 mg total) by mouth at bedtime. 90 tablet 3  . venlafaxine XR (EFFEXOR-XR) 75 MG  24 hr capsule Take 75 mg by mouth daily.    [name] ezetimibe (ZETIA) 10 MG tablet Take 1 tablet (10 mg total) by mouth daily. 90 tablet 3   No current facility-administered medications for this visit.    Allergies:   Sulfa antibiotics    Social History:  The patient  reports that he has been smoking cigars. He has a 48.00 pack-year smoking history. He has never used smokeless tobacco. He reports current alcohol use of about 7.0 standard drinks of alcohol per week. He reports that he does not use drugs.   Family History:  The patient's family history includes Cancer in his mother.    ROS:  Please see the history of present illness.   Otherwise, review of systems are positive for none.   All other systems are reviewed and negative.    PHYSICAL EXAM: VS:  BP 124/78  Pulse (!) 58   Temp (!) 97 F (36.1 C)   Ht 5\' 10"  (1.778 m)   Wt 142 lb 6.4 oz (64.6 kg)   SpO2 94%   BMI 20.43 kg/m  , BMI Body mass index is 20.43 kg/m. GEN: Well nourished, well developed, in no acute distress  HEENT: normal  Neck: no JVD, carotid bruits, or masses Cardiac: RRR; no murmurs, rubs, or gallops,no edema  Respiratory:  clear to auscultation bilaterally, normal work of breathing GI: soft, nontender, nondistended, + BS MS: no deformity or atrophy  Skin: warm and dry, no rash Neuro:  Strength and sensation are intact Psych: euthymic mood, full affect Vascular: Femoral pulse: Barely palpable on the right side and +1 on the left side.  EKG:  EKG is not ordered today.   Recent Labs: 11/20/2019: Platelets 213 12/15/2019: Hemoglobin 12.9; Hemoglobin 12.9 03/01/2020: BUN 12; Creatinine, Ser 1.00; Potassium 4.4; Sodium 137    Lipid Panel    Component Value Date/Time   CHOL 115 02/19/2020 0914   TRIG 67 02/19/2020 0914   HDL 50 02/19/2020 0914   CHOLHDL 2.3 02/19/2020 0914   CHOLHDL 3.0 11/20/2019 1231   VLDL 28 11/20/2019 1231   LDLCALC 51 02/19/2020 0914      Wt Readings from Last 3 Encounters:    03/15/20 142 lb 6.4 oz (64.6 kg)  03/01/20 145 lb 9.6 oz (66 kg)  02/26/20 145 lb 12.8 oz (66.1 kg)       No flowsheet data found.    ASSESSMENT AND PLAN:   1.  Peripheral arterial disease: Status post endovascular intervention on the left lower extremity with no recurrent claudication.  However, he is now having severe right calf claudication.  He is known to have an occluded stent in the right popliteal artery.  However, it appears that there is likely worsening of his right iliac disease that is responsible for worsening symptoms over the last few months.  Given significant limitations and severe symptoms, I recommend proceeding with abdominal aortogram with lower extremity runoff and possible endovascular intervention.  Planned access is via the right common iliac artery in order to be able to treat the common iliac artery.  Given heavy calcifications, should consider atherectomy.   2. Tobacco use: I again discussed with him the importance of smoking cessation but he reports inability to quit.  3. Hyperlipidemia: The patient was switched to high-dose rosuvastatin with improvement in LDL to 93.  4. Coronary artery disease: No anginal symptoms.   5. Chronic systolic heart failure: He is followed by the heart failure clinic with improvement in symptoms after adjusting his medications.  6.  Essential hypertension: Blood pressure is reasonably controlled on current medications    Disposition:   FU with me in 1 months  Signed,  Kathlyn Sacramento, MD  03/15/2020 8:25 AM    [place]

## 2020-03-15 NOTE — Telephone Encounter (Signed)
Noted thank you

## 2020-03-15 NOTE — Telephone Encounter (Signed)
Pt c/o medication issue:  1. Name of Medication: dapagliflozin propanediol (FARXIGA) 10 MG TABS tablet  2. How are you currently taking this medication (dosage and times per day)? n/a  3. Are you having a reaction (difficulty breathing--STAT)? No  4. What is your medication issue? Patient wanted to make Lattie Haw, Dr. Tyrell Antonio nurse, aware that he is no longer taking Farixga and Dr. Aundra Dubin has put him on empagliflozin (JARDIANCE) 10 MG TABS tablet. He saw the medication still listed on MyChart and wanted to advise her before he goes into surgery on 03/23/20.

## 2020-03-19 ENCOUNTER — Other Ambulatory Visit (HOSPITAL_COMMUNITY)
Admission: RE | Admit: 2020-03-19 | Discharge: 2020-03-19 | Disposition: A | Discharge status: Home / Self Care | Payer: Medicare Other | Source: Ambulatory Visit | Attending: Cardiovascular Disease | Admitting: Cardiovascular Disease

## 2020-03-19 DIAGNOSIS — Z01812 Encounter for preprocedural laboratory examination: Secondary | ICD-10-CM | POA: Diagnosis not present

## 2020-03-19 DIAGNOSIS — Z20822 Contact with and (suspected) exposure to covid-19: Secondary | ICD-10-CM | POA: Insufficient documentation

## 2020-03-19 LAB — SARS CORONAVIRUS 2 (TAT 6-24 HRS): SARS Coronavirus 2: NEGATIVE

## 2020-03-20 NOTE — Progress Notes (Signed)
Cardiology Office Note   Date:  03/30/2020   ID:  Nicholas Herrera, DOB 1941/08/28, MRN [id number]  PCP:  Jonathon Jordan, MD  Cardiologist:  Dr. Tamala Julian, MD   Chief Complaint  Patient presents with  . Coronary Artery Disease    History of Present Illness: Nicholas Herrera is a 79 y.o. male who presents for follow-up, seen for Dr. Tamala Julian.  Nicholas Herrera has a long cardiac history including hypertension,nonobstructive CAD with longstanding cardiomyopathy (LHC/RHC 11/2019 with nonobstructive CAD),chronic combined systolic anddiastolic heart failure initialEF at <30% with improvement by 2011 at 40 to 45% however again reduced 04/2019 at 20 to 25%. Given new reduction he underwent LHC/RHC as above.  Also history of AICD placement 2004 (now deactivated),CKD stage III, essential hypertension, complex PAD followed (Dr. Rogue Jury Arida),longstanding tobacco use, and hyperlipidemia.  He is followed extensively for his multiple cardiac issues including Dr. Fletcher Anon for his PAD, Dr. Aundra Dubin for extensive cardiomyopathy, Dr. Debara Pickett for HLD and Dr. Tamala Julian for CAD.   Most recently he developed right calf claudication and was seen by Dr. Fletcher Anon at which time repeat vascular studies showed normal ABI on the left and moderately reduced on the right at 0.58 with recommendations for abdominal aortogram with lower extremity runoff and possible endovascular intervention.   Today Nicholas Herrera is doing well from a CV standpoint.  Reports no further claudication symptoms since PVD intervention as above. He denies chest pain, shortness of breath, PND, orthopnea, LE edema, dizziness or syncope.  No medication intolerances, weight is stable.  Reports he is now able to perform yard work and other activities without lower extremity pain.  Tolerating high dose of Entresto with stable BP at 108/62.    Past Medical History:  Diagnosis Date  . Abdominal distention   . Abdominal pain   . Bronchitis    hx of  . Cancer ([place])    skin  . CHF (congestive heart failure) (HCC)     (hx of EF 27%) s/p AICD 2004. Most recent LVEF > 40% , 8/11  . Chronic kidney disease    has small stones, no treatment  . Cough   . Depression    takes paxil  . Diverticulosis    hx of  . Dysrhythmia   . Easy bruising   . Hematuria    hx of  . Hernia october 2012   [facility]  . HTN (hypertension)   . Hyperlipidemia    takes zocor  . Inguinal hernia   . Left groin pain     Past Surgical History:  Procedure Laterality Date  . ABDOMINAL AORTOGRAM W/LOWER EXTREMITY N/A 09/03/2018   Procedure: ABDOMINAL AORTOGRAM W/LOWER EXTREMITY;  Surgeon: Wellington Hampshire, MD;  Location: [place] [facility];  Service: Cardiovascular;  Laterality: N/A;  . ABDOMINAL AORTOGRAM W/LOWER EXTREMITY N/A 07/29/2019   Procedure: ABDOMINAL AORTOGRAM W/LOWER EXTREMITY;  Surgeon: Wellington Hampshire, MD;  Location: [place] [facility];  Service: Cardiovascular;  Laterality: N/A;  . ABDOMINAL AORTOGRAM W/LOWER EXTREMITY Bilateral 03/23/2020   Procedure: ABDOMINAL AORTOGRAM W/LOWER EXTREMITY;  Surgeon: Wellington Hampshire, MD;  Location: [place] [facility];  Service: Cardiovascular;  Laterality: Bilateral;  . APPENDECTOMY    . CARDIAC CATHETERIZATION    . CARDIAC DEFIBRILLATOR REMOVAL  2005  . HERNIA REPAIR  09/11/11   repair of LIH   . INGUINAL HERNIA REPAIR  09/11/2011   Procedure: HERNIA REPAIR INGUINAL ADULT;  Surgeon: Judieth Keens, DO;  Location: [place];  Service: General;  Laterality: Left;  open left inguinal hernia with mesh  . LOWER EXTREMITY ANGIOGRAM N/A 02/05/2013   Procedure: LOWER EXTREMITY ANGIOGRAM;  Surgeon: Jettie Booze, MD;  Location: [facility] CATH LAB;  Service: Cardiovascular;  Laterality: N/A;  . PERIPHERAL VASCULAR BALLOON ANGIOPLASTY  07/29/2019   Procedure: PERIPHERAL VASCULAR BALLOON ANGIOPLASTY;  Surgeon: Wellington Hampshire, MD;  Location: [place] [facility];  Service: Cardiovascular;;  . PERIPHERAL VASCULAR INTERVENTION Left 09/03/2018    Procedure: PERIPHERAL VASCULAR INTERVENTION;  Surgeon: Wellington Hampshire, MD;  Location: [place] [facility];  Service: Cardiovascular;  Laterality: Left;  . PERIPHERAL VASCULAR INTERVENTION Right 03/23/2020   Procedure: PERIPHERAL VASCULAR INTERVENTION;  Surgeon: Wellington Hampshire, MD;  Location: [place] [facility];  Service: Cardiovascular;  Laterality: Right;  . RIGHT/LEFT HEART CATH AND CORONARY ANGIOGRAPHY N/A 12/15/2019   Procedure: RIGHT/LEFT HEART CATH AND CORONARY ANGIOGRAPHY;  Surgeon: [name], MD;  Location: [place] [facility];  Service: Cardiovascular;  Laterality: N/A;     Current Outpatient Medications  Medication Sig Dispense Refill  . aspirin (ASPIRIN CHILDRENS) 81 MG chewable tablet Chew 1 tablet (81 mg total) by mouth daily. 36 tablet 11  . carvedilol (COREG) 12.5 MG tablet Take 1 tablet (12.5 mg total) by mouth 2 (two) times daily with a meal. 60 tablet 5  . clopidogrel (PLAVIX) 75 MG tablet Take 1 tablet (75 mg total) by mouth daily with breakfast. 30 tablet 11  . diphenhydramine-acetaminophen (TYLENOL PM) 25-500 MG TABS tablet Take 2 tablets by mouth at bedtime.    . empagliflozin (JARDIANCE) 10 MG TABS tablet Take 10 mg by mouth daily before breakfast. 30 tablet 11  . ezetimibe (ZETIA) 10 MG tablet Take 1 tablet (10 mg total) by mouth daily. 90 tablet 3  . FIBER PO Take 1 capsule by mouth 2 (two) times daily.    . mirabegron ER (MYRBETRIQ) 50 MG TB24 tablet Take 50 mg by mouth daily.    . Multiple Vitamins-Minerals (ONE-A-DAY MENS 50+ ADVANTAGE PO) Take 1 tablet by mouth daily.     [name] PARoxetine (PAXIL) 40 MG tablet Take 40 mg by mouth daily.    . rosuvastatin (CRESTOR) 40 MG tablet Take 1 tablet (40 mg total) by mouth daily. 90 tablet 3  . sacubitril-valsartan (ENTRESTO) 97-103 MG Take 1 tablet by mouth 2 (two) times daily. 60 tablet 5  . sodium chloride (OCEAN) 0.65 % SOLN nasal spray Place 1 spray into both nostrils daily as needed for congestion.    [name]  spironolactone (ALDACTONE) 25 MG tablet Take 1 tablet (25 mg total) by mouth at bedtime. 90 tablet 3  . venlafaxine XR (EFFEXOR-XR) 75 MG 24 hr capsule Take 75 mg by mouth daily.     No current facility-administered medications for this visit.    Allergies:   Sulfa antibiotics    Social History:  The patient  reports that he has been smoking cigars. He has a 48.00 pack-year smoking history. He has never used smokeless tobacco. He reports current alcohol use of about 7.0 standard drinks of alcohol per week. He reports that he does not use drugs.   Family History:  The patient's family history includes Cancer in his mother.    ROS:  Please see the history of present illness.   Otherwise, review of systems are positive for none. All other systems are reviewed and negative.    PHYSICAL EXAM: VS:  BP 108/62   Pulse 70   Ht 5\' 10"  (  1.778 m)   Wt 142 lb 12.8 oz (64.8 kg)   SpO2 95%   BMI 20.49 kg/m  , BMI Body mass index is 20.49 kg/m.   General: Well developed, well nourished, NAD Neck: Negative for carotid bruits. No JVD Lungs:Clear to ausculation bilaterally. No wheezes, rales, or rhonchi. Breathing is unlabored. Cardiovascular: RRR with S1 S2. No murmurs Extremities: No edema. Radial pulses 2+ bilaterally Neuro: Alert and oriented. No focal deficits. No facial asymmetry. MAE spontaneously. Psych: Responds to questions appropriately with normal affect.    EKG:  EKG is not ordered today.  Recent Labs: 03/01/2020: BUN 12; Creatinine, Ser 1.00; Potassium 4.4; Sodium 137 03/15/2020: Hemoglobin 14.5; Platelets 187    Lipid Panel    Component Value Date/Time   CHOL 115 02/19/2020 0914   TRIG 67 02/19/2020 0914   HDL 50 02/19/2020 0914   CHOLHDL 2.3 02/19/2020 0914   CHOLHDL 3.0 11/20/2019 1231   VLDL 28 11/20/2019 1231   LDLCALC 51 02/19/2020 0914    Wt Readings from Last 3 Encounters:  03/30/20 142 lb 12.8 oz (64.8 kg)  03/23/20 145 lb (65.8 kg)  03/15/20 142 lb 6.4 oz  (64.6 kg)    Other studies Reviewed: Additional studies/ records that were reviewed today include:   ECHOCARDIOGRAM 04/2019: IMPRESSIONS   1. The left ventricle has severely reduced systolic function, with an ejection fraction of 20-25%. The cavity size was moderately dilated. Left ventricular diastolic Doppler parameters are consistent with impaired relaxation. Left ventricular diffuse  hypokinesis. 2. The right ventricle has normal systolic function. The cavity was normal. 3. The aortic valve is tricuspid. Mild calcification of the aortic valve. No stenosis of the aortic valve. 4. Severe global reduction in LV systolic function; moderate LVE; mild diastolic dysfunction.  RHC/LHC 12/15/2019:  Normal right heart pressures including pulmonary capillary wedge.  Normal left ventricular end-diastolic pressure.  Estimated LVEF based on qualitative assessment of hand-injection into the left ventricle suggests an EF of 35%.  Coronaries are widely patent.  LAD contains eccentric 40 to 50% mid vessel stenosis.  Circumflex contains mid vessel eccentric 40% stenosis.  The right coronary is dominant and contains proximal and distal 30% stenoses.  RECOMMENDATIONS:   Hemodynamics are normal.  Heart failure management has rendered the patient euvolemic.  Refer back to advanced heart failure clinic for further fine-tuning of therapy and other recommendations.   ASSESSMENT AND PLAN:  1. Chronic systolic heart failure/cardiomyopathy:  -Nonobstructive per RHC/LHC 11/2019 -Most recent echocardiogram with further reduction in LV function to 20 to 25% -Entresto further increased >> currently on 97/103 and tolerating well -Referred to advanced heart failure, Dr. Aundra Dubin as well as Dr. Lovena Le for possible ICD lead alteration -Per Dr. Lovena Le removal of ICD leads would be risky>> may plan subcutaneous ICD however more research needed per Dr. Tanna Furry note.   -Appears euvolemic on exam  2.  Tobacco use: -Smoking cessation strongly encouraged    3. Hyperlipidemia:  -Last LDL, 89 -Transition to high intensity rosuvastatin with improvement in LDL to 93  -Follows with Dr. Debara Pickett with lipid clinic>> continue Crestor, Zetia  4. Coronary artery disease:  -No anginal symptoms -Continue current regimen  5.  PAD: -More recent issues with left lower extremity claudication for which he underwent endovascular intervention.  -Has follow up Do[facility]lers next week and Dr. Fletcher Anon following -Denies claudication -Continue current regimen  Current medicines are reviewed at length with the patient today.  The patient does not have concerns regarding medicines.  The following  changes have been made:  no change  Labs/ tests ordered today include: BMET No orders of the defined types were placed in this encounter.    Disposition:   FU with Dr. Tamala Julian in 6 months  Signed, Kathyrn Drown, NP  03/30/2020 10:23 AM    [address], [place], [place]  [address] Phone: [phone]; Fax: [phone]

## 2020-03-21 ENCOUNTER — Telehealth (HOSPITAL_COMMUNITY): Payer: Self-pay | Admitting: Pharmacist

## 2020-03-21 NOTE — Telephone Encounter (Signed)
Applied for [place]. Application pending.   Audry Riles, PharmD, BCPS, BCCP, CPP Heart Failure Clinic Pharmacist [phone]

## 2020-03-22 ENCOUNTER — Telehealth: Payer: Self-pay | Admitting: *Deleted

## 2020-03-22 NOTE — Telephone Encounter (Signed)
Pt contacted pre-abdominal aortogram scheduled at [facility] for: Wednesday Mar 23, 2020 8:30 AM Verified arrival time and place: [facility]) at: 6:30 AM   No solid food after midnight prior to cath, clear liquids until 5 AM day of procedure.   Hold: Jardiance-AM of procedure Spironolactone-PM prior to procedure   Except hold medications AM meds can be  taken pre-cath with sip of water including: ASA 81 mg Plavix 75 mg  Confirmed patient has responsible adult to drive home post procedure and observe 24 hours after arriving home:  yes  You are allowed ONE visitor in the waiting room during your procedure. Both you and your visitor must wear masks.      COVID-19 Pre-Screening Questions:  . In the past 7 to 10 days have you had a cough,  shortness of breath, headache, congestion, fever (100 or greater) body aches, chills, sore throat, or sudden loss of taste or sense of smell? no . Have you been around anyone with known Covid 19 in the past 7 to 10 days? no . Have you been around anyone who is awaiting Covid 19 test results in the past 7 to 10 days? no . Have you been around anyone who has mentioned symptoms of Covid 19 within the past 7 to 10 days? no   Reviewed procedure/mask/visitor instructions with patient.

## 2020-03-23 ENCOUNTER — Encounter (HOSPITAL_COMMUNITY)
Admission: RE | Disposition: A | Discharge status: Home / Self Care | Payer: Self-pay | Source: Home / Self Care | Attending: Cardiovascular Disease

## 2020-03-23 ENCOUNTER — Ambulatory Visit (HOSPITAL_COMMUNITY)
Admission: RE | Admit: 2020-03-23 | Discharge: 2020-03-23 | Disposition: A | Discharge status: Home / Self Care | Payer: Medicare Other | Attending: Cardiovascular Disease | Admitting: Cardiovascular Disease

## 2020-03-23 ENCOUNTER — Other Ambulatory Visit: Payer: Self-pay

## 2020-03-23 DIAGNOSIS — I130 Hypertensive heart and chronic kidney disease with heart failure and stage 1 through stage 4 chronic kidney disease, or unspecified chronic kidney disease: Secondary | ICD-10-CM | POA: Diagnosis not present

## 2020-03-23 DIAGNOSIS — F17290 Nicotine dependence, other tobacco product, uncomplicated: Secondary | ICD-10-CM | POA: Diagnosis not present

## 2020-03-23 DIAGNOSIS — I5042 Chronic combined systolic (congestive) and diastolic (congestive) heart failure: Secondary | ICD-10-CM | POA: Diagnosis not present

## 2020-03-23 DIAGNOSIS — N189 Chronic kidney disease, unspecified: Secondary | ICD-10-CM | POA: Diagnosis not present

## 2020-03-23 DIAGNOSIS — Z95810 Presence of automatic (implantable) cardiac defibrillator: Secondary | ICD-10-CM | POA: Diagnosis not present

## 2020-03-23 DIAGNOSIS — I2510 Atherosclerotic heart disease of native coronary artery without angina pectoris: Secondary | ICD-10-CM | POA: Diagnosis not present

## 2020-03-23 DIAGNOSIS — E785 Hyperlipidemia, unspecified: Secondary | ICD-10-CM | POA: Insufficient documentation

## 2020-03-23 DIAGNOSIS — F329 Major depressive disorder, single episode, unspecified: Secondary | ICD-10-CM | POA: Diagnosis not present

## 2020-03-23 DIAGNOSIS — I70211 Atherosclerosis of native arteries of extremities with intermittent claudication, right leg: Secondary | ICD-10-CM | POA: Insufficient documentation

## 2020-03-23 DIAGNOSIS — Z7902 Long term (current) use of antithrombotics/antiplatelets: Secondary | ICD-10-CM | POA: Diagnosis not present

## 2020-03-23 DIAGNOSIS — Z79899 Other long term (current) drug therapy: Secondary | ICD-10-CM | POA: Insufficient documentation

## 2020-03-23 DIAGNOSIS — Z7984 Long term (current) use of oral hypoglycemic drugs: Secondary | ICD-10-CM | POA: Insufficient documentation

## 2020-03-23 DIAGNOSIS — Z882 Allergy status to sulfonamides status: Secondary | ICD-10-CM | POA: Insufficient documentation

## 2020-03-23 DIAGNOSIS — Z7982 Long term (current) use of aspirin: Secondary | ICD-10-CM | POA: Insufficient documentation

## 2020-03-23 DIAGNOSIS — I739 Peripheral vascular disease, unspecified: Secondary | ICD-10-CM

## 2020-03-23 HISTORY — PX: PERIPHERAL VASCULAR INTERVENTION: CATH118257

## 2020-03-23 HISTORY — PX: ABDOMINAL AORTOGRAM W/LOWER EXTREMITY: CATH118223

## 2020-03-23 LAB — GLUCOSE, CAPILLARY
Glucose-Capillary: 136 mg/dL — ABNORMAL HIGH (ref 70–99)
Glucose-Capillary: 68 mg/dL — ABNORMAL LOW (ref 70–99)
Glucose-Capillary: 73 mg/dL (ref 70–99)
Glucose-Capillary: 187 mg/dL — ABNORMAL HIGH (ref 70–99)

## 2020-03-23 LAB — POCT ACTIVATED CLOTTING TIME: Activated Clotting Time: 257 seconds

## 2020-03-23 SURGERY — ABDOMINAL AORTOGRAM W/LOWER EXTREMITY
Anesthesia: LOCAL | Laterality: Right

## 2020-03-23 MED ORDER — NITROGLYCERIN IN D5W 200-5 MCG/ML-% IV SOLN
INTRAVENOUS | Status: AC
  Filled 2020-03-23: qty 250

## 2020-03-23 MED ORDER — VERAPAMIL HCL 2.5 MG/ML IV SOLN
INTRAVENOUS | Status: AC
  Filled 2020-03-23: qty 2

## 2020-03-23 MED ORDER — IODIXANOL 320 MG/ML IV SOLN
INTRAVENOUS | Status: DC | PRN
  Administered 2020-03-23: 130 mL via INTRA_ARTERIAL

## 2020-03-23 MED ORDER — ASPIRIN 81 MG PO CHEW: 81.0000 mg | CHEWABLE_TABLET | ORAL | Status: DC

## 2020-03-23 MED ORDER — ACETAMINOPHEN 325 MG PO TABS: 650.0000 mg | ORAL_TABLET | ORAL | Status: DC | PRN

## 2020-03-23 MED ORDER — MIDAZOLAM HCL 2 MG/2ML IJ SOLN
INTRAMUSCULAR | Status: AC
  Filled 2020-03-23: qty 2

## 2020-03-23 MED ORDER — SODIUM CHLORIDE 0.9% FLUSH: 3.0000 mL | Freq: Two times a day (BID) | INTRAVENOUS | Status: DC

## 2020-03-23 MED ORDER — DEXTROSE 50 % IV SOLN
INTRAVENOUS | Status: AC
  Filled 2020-03-23: qty 50

## 2020-03-23 MED ORDER — HEPARIN SODIUM (PORCINE) 1000 UNIT/ML IJ SOLN
INTRAMUSCULAR | Status: AC
  Filled 2020-03-23: qty 1

## 2020-03-23 MED ORDER — LIDOCAINE HCL (PF) 1 % IJ SOLN
INTRAMUSCULAR | Status: DC | PRN
  Administered 2020-03-23: 15 mL via INTRADERMAL

## 2020-03-23 MED ORDER — VIPERSLIDE LUBRICANT OPTIME
TOPICAL | Status: DC | PRN
  Administered 2020-03-23: 50 mL via SURGICAL_CAVITY

## 2020-03-23 MED ORDER — SODIUM CHLORIDE 0.9 % IV SOLN: INTRAVENOUS | Status: DC

## 2020-03-23 MED ORDER — LIDOCAINE HCL (PF) 1 % IJ SOLN
INTRAMUSCULAR | Status: AC
  Filled 2020-03-23: qty 30

## 2020-03-23 MED ORDER — SODIUM CHLORIDE 0.9% FLUSH: 3.0000 mL | INTRAVENOUS | Status: DC | PRN

## 2020-03-23 MED ORDER — NITROGLYCERIN 1 MG/10 ML FOR IR/CATH LAB
INTRA_ARTERIAL | Status: DC | PRN
  Administered 2020-03-23: 200 ug via INTRA_ARTERIAL

## 2020-03-23 MED ORDER — FENTANYL CITRATE (PF) 100 MCG/2ML IJ SOLN
INTRAMUSCULAR | Status: AC
  Filled 2020-03-23: qty 2

## 2020-03-23 MED ORDER — FENTANYL CITRATE (PF) 100 MCG/2ML IJ SOLN
INTRAMUSCULAR | Status: DC | PRN
  Administered 2020-03-23 (×2): 25 ug via INTRAVENOUS

## 2020-03-23 MED ORDER — SODIUM CHLORIDE 0.9 % WEIGHT BASED INFUSION: 1.0000 mL/kg/h | INTRAVENOUS | Status: DC

## 2020-03-23 MED ORDER — SODIUM CHLORIDE 0.9 % WEIGHT BASED INFUSION
3.0000 mL/kg/h | INTRAVENOUS | Status: AC
  Administered 2020-03-23: 3 mL/kg/h via INTRAVENOUS

## 2020-03-23 MED ORDER — DEXTROSE 50 % IV SOLN
25.0000 mL | Freq: Once | INTRAVENOUS | Status: AC
  Administered 2020-03-23: 25 mL via INTRAVENOUS

## 2020-03-23 MED ORDER — SODIUM CHLORIDE 0.9 % IV SOLN: 250.0000 mL | INTRAVENOUS | Status: DC | PRN

## 2020-03-23 MED ORDER — HEPARIN SODIUM (PORCINE) 1000 UNIT/ML IJ SOLN
INTRAMUSCULAR | Status: DC | PRN
  Administered 2020-03-23: 6000 [IU] via INTRAVENOUS

## 2020-03-23 MED ORDER — HEPARIN (PORCINE) IN NACL 1000-0.9 UT/500ML-% IV SOLN
INTRAVENOUS | Status: AC
  Filled 2020-03-23: qty 1000

## 2020-03-23 MED ORDER — ONDANSETRON HCL 4 MG/2ML IJ SOLN: 4.0000 mg | Freq: Four times a day (QID) | INTRAMUSCULAR | Status: DC | PRN

## 2020-03-23 MED ORDER — HYDRALAZINE HCL 20 MG/ML IJ SOLN: 5.0000 mg | INTRAMUSCULAR | Status: DC | PRN

## 2020-03-23 MED ORDER — HEPARIN (PORCINE) IN NACL 1000-0.9 UT/500ML-% IV SOLN
INTRAVENOUS | Status: DC | PRN
  Administered 2020-03-23 (×2): 500 mL

## 2020-03-23 MED ORDER — MIDAZOLAM HCL 2 MG/2ML IJ SOLN
INTRAMUSCULAR | Status: DC | PRN
  Administered 2020-03-23 (×2): 1 mg via INTRAVENOUS

## 2020-03-23 SURGICAL SUPPLY — 31 items
BALLN MUSTANG 5X100X135 (BALLOONS) ×3
BALLN MUSTANG 7X100X135 (BALLOONS) ×3
BALLOON MUSTANG 5X100X135 (BALLOONS) ×2 IMPLANT
BALLOON MUSTANG 7X100X135 (BALLOONS) ×2 IMPLANT
CATH ANGIO 5F PIGTAIL 65CM (CATHETERS) ×3 IMPLANT
CATH BEACON 5 .035 65 KMP TIP (CATHETERS) ×3 IMPLANT
CLOSURE MYNX CONTROL 6F/7F (Vascular Products) ×3 IMPLANT
DEVICE TORQUE .025-.038 (MISCELLANEOUS) ×3 IMPLANT
DIAMONDBACK CLASSIC OAS 2.0MM (CATHETERS) ×3
GUIDEWIRE ANGLED .035X150CM (WIRE) ×3 IMPLANT
KIT ENCORE 26 ADVANTAGE (KITS) ×3 IMPLANT
KIT MICROPUNCTURE NIT STIFF (SHEATH) ×3 IMPLANT
KIT PV (KITS) ×3 IMPLANT
LUBRICANT VIPERSLIDE CORONARY (MISCELLANEOUS) ×3 IMPLANT
SHEATH BRITE TIP 7FR 35CM (SHEATH) ×3 IMPLANT
SHEATH PINNACLE 5F 10CM (SHEATH) ×3 IMPLANT
SHEATH PINNACLE 7F 10CM (SHEATH) ×3 IMPLANT
SHEATH PROBE COVER 6X72 (BAG) ×3 IMPLANT
STENT ELUVIA 7X100X130 (Permanent Stent) ×3 IMPLANT
STENT OMNILINK ELITE 8X29X80 (Permanent Stent) ×3 IMPLANT
STOPCOCK MORSE 400PSI 3WAY (MISCELLANEOUS) ×3 IMPLANT
SYR MEDRAD MARK 7 150ML (SYRINGE) ×3 IMPLANT
SYSTEM DIMODBCK CLSC OAS 2.0MM (CATHETERS) ×2 IMPLANT
TAPE VIPERTRACK RADIOPAQ (MISCELLANEOUS) ×2 IMPLANT
TAPE VIPERTRACK RADIOPAQUE (MISCELLANEOUS) ×1
TRANSDUCER W/STOPCOCK (MISCELLANEOUS) ×3 IMPLANT
TRAY PV CATH (CUSTOM PROCEDURE TRAY) ×3 IMPLANT
TUBING CIL FLEX 10 FLL-RA (TUBING) ×3 IMPLANT
WIRE HITORQ VERSACORE ST 145CM (WIRE) ×3 IMPLANT
WIRE VERSACORE LOC 115CM (WIRE) ×3 IMPLANT
WIRE VIPER ADVANCE .017X335CM (WIRE) ×3 IMPLANT

## 2020-03-23 NOTE — Discharge Instructions (Signed)
Angiogram, Care After This sheet gives you information about how to care for yourself after your procedure. Your health care provider may also give you more specific instructions. If you have problems or questions, contact your health care provider. What can I expect after the procedure? After the procedure, it is common to have bruising and tenderness at the catheter insertion area. Follow these instructions at home: Insertion site care  Follow instructions from your health care provider about how to take care of your insertion site. Make sure you: ? Wash your hands with soap and water before you change your bandage (dressing). If soap and water are not available, use hand sanitizer. ? Change your dressing as told by your health care provider. ? Leave stitches (sutures), skin glue, or adhesive strips in place. These skin closures may need to stay in place for 2 weeks or longer. If adhesive strip edges start to loosen and curl up, you may trim the loose edges. Do not remove adhesive strips completely unless your health care provider tells you to do that.  Do not take baths, swim, or use a hot tub until your health care provider approves.  You may shower 24-48 hours after the procedure or as told by your health care provider. ? Gently wash the site with plain soap and water. ? Pat the area dry with a clean towel. ? Do not rub the site. This may cause bleeding.  Do not apply powder or lotion to the site. Keep the site clean and dry.  Check your insertion site every day for signs of infection. Check for: ? Redness, swelling, or pain. ? Fluid or blood. ? Warmth. ? Pus or a bad smell. Activity  Rest as told by your health care provider, usually for 1-2 days.  Do not lift anything that is heavier than 10 lbs. (4.5 kg) or as told by your health care provider.  Do not drive for 24 hours if you were given a medicine to help you relax (sedative).  Do not drive or use heavy machinery while  taking prescription pain medicine. General instructions   Return to your normal activities as told by your health care provider, usually in about a week. Ask your health care provider what activities are safe for you.  If the catheter site starts bleeding, lie flat and put pressure on the site. If the bleeding does not stop, get help right away. This is a medical emergency.  Drink enough fluid to keep your urine clear or pale yellow. This helps flush the contrast dye from your body.  Take over-the-counter and prescription medicines only as told by your health care provider.  Keep all follow-up visits as told by your health care provider. This is important. Contact a health care provider if:  You have a fever or chills.  You have redness, swelling, or pain around your insertion site.  You have fluid or blood coming from your insertion site.  The insertion site feels warm to the touch.  You have pus or a bad smell coming from your insertion site.  You have bruising around the insertion site.  You notice blood collecting in the tissue around the catheter site (hematoma). The hematoma may be painful to the touch. Get help right away if:  You have severe pain at the catheter insertion area.  The catheter insertion area swells very fast.  The catheter insertion area is bleeding, and the bleeding does not stop when you hold steady pressure on the area.    The area near or just beyond the catheter insertion site becomes pale, cool, tingly, or numb. These symptoms may represent a serious problem that is an emergency. Do not wait to see if the symptoms will go away. Get medical help right away. Call your local emergency services (911 in the [place] Do not drive yourself to the hospital. Summary  After the procedure, it is common to have bruising and tenderness at the catheter insertion area.  After the procedure, it is important to rest and drink plenty of fluids.  Do not take baths,  swim, or use a hot tub until your health care provider says it is okay to do so. You may shower 24-48 hours after the procedure or as told by your health care provider.  If the catheter site starts bleeding, lie flat and put pressure on the site. If the bleeding does not stop, get help right away. This is a medical emergency. This information is not intended to replace advice given to you by your health care provider. Make sure you discuss any questions you have with your health care provider. Document Revised: 09/27/2017 Document Reviewed: 09/19/2016 Elsevier Patient Education  2020 [employer] Inc[employer]  

## 2020-03-23 NOTE — Interval H&P Note (Signed)
History and Physical Interval Note:  03/23/2020 8:38 AM  Nicholas Herrera  has presented today for surgery, with the diagnosis of PAD.  The various methods of treatment have been discussed with the patient and family. After consideration of risks, benefits and other options for treatment, the patient has consented to  Procedure(s): ABDOMINAL AORTOGRAM W/LOWER EXTREMITY (Bilateral) as a surgical intervention.  The patient's history has been reviewed, patient examined, no change in status, stable for surgery.  I have reviewed the patient's chart and labs.  Questions were answered to the patient's satisfaction.     Kathlyn [place]

## 2020-03-24 ENCOUNTER — Other Ambulatory Visit: Payer: Self-pay | Admitting: *Deleted

## 2020-03-24 DIAGNOSIS — I739 Peripheral vascular disease, unspecified: Secondary | ICD-10-CM

## 2020-03-30 ENCOUNTER — Other Ambulatory Visit: Payer: Self-pay

## 2020-03-30 ENCOUNTER — Ambulatory Visit (INDEPENDENT_AMBULATORY_CARE_PROVIDER_SITE_OTHER): Payer: Medicare Other | Admitting: Cardiology

## 2020-03-30 ENCOUNTER — Encounter: Payer: Self-pay | Admitting: Cardiology

## 2020-03-30 VITALS — BP 108/62 | HR 70 | Ht 70.0 in | Wt 142.8 lb

## 2020-03-30 DIAGNOSIS — I10 Essential (primary) hypertension: Secondary | ICD-10-CM | POA: Diagnosis not present

## 2020-03-30 DIAGNOSIS — I5022 Chronic systolic (congestive) heart failure: Secondary | ICD-10-CM

## 2020-03-30 DIAGNOSIS — Z79899 Other long term (current) drug therapy: Secondary | ICD-10-CM | POA: Diagnosis not present

## 2020-03-30 DIAGNOSIS — I739 Peripheral vascular disease, unspecified: Secondary | ICD-10-CM

## 2020-03-30 DIAGNOSIS — E785 Hyperlipidemia, unspecified: Secondary | ICD-10-CM

## 2020-03-30 DIAGNOSIS — I2510 Atherosclerotic heart disease of native coronary artery without angina pectoris: Secondary | ICD-10-CM | POA: Diagnosis not present

## 2020-03-30 LAB — BASIC METABOLIC PANEL
BUN/Creatinine Ratio: 14 (ref 10–24)
BUN: 12 mg/dL (ref 8–27)
CO2: 23 mmol/L (ref 20–29)
Calcium: 9.3 mg/dL (ref 8.6–10.2)
Chloride: 99 mmol/L (ref 96–106)
Creatinine, Ser: 0.86 mg/dL (ref 0.76–1.27)
GFR calc Af Amer: 97 mL/min/{1.73_m2} (ref >59)
GFR calc non Af Amer: 84 mL/min/{1.73_m2} (ref >59)
Glucose: 101 mg/dL — ABNORMAL HIGH (ref 65–99)
Potassium: 4.5 mmol/L (ref 3.5–5.2)
Sodium: 138 mmol/L (ref 134–144)

## 2020-03-30 NOTE — Patient Instructions (Signed)
Medication Instructions:   Your physician recommends that you continue on your current medications as directed. Please refer to the Current Medication list given to you today.  *If you need a refill on your cardiac medications before your next appointment, please call your pharmacy*  Lab Work:  You will have labs drawn today: BMET  If you have labs (blood work) drawn today and your tests are completely normal, you will receive your results only by: [name] MyChart Message (if you have MyChart) OR . A paper copy in the mail If you have any lab test that is abnormal or we need to change your treatment, we will call you to review the results.  Testing/Procedures:  None ordered today  Follow-Up:  On 09/29/20 at 10:40PM with Daneen Schick, MD

## 2020-04-01 NOTE — Telephone Encounter (Signed)
Obtained [employer] Heart Failure grant for copay assistance with Delene Loll:   ID: [id number] PCN: [id number] Group ID: [id number] RxBin: [id number] Start Date: 12/24/19 End Date: 03/22/21  Audry Riles, PharmD, BCPS, BCCP, CPP Heart Failure Clinic Pharmacist [phone]

## 2020-04-05 ENCOUNTER — Ambulatory Visit (HOSPITAL_BASED_OUTPATIENT_CLINIC_OR_DEPARTMENT_OTHER)
Admission: RE | Admit: 2020-04-05 | Discharge: 2020-04-05 | Disposition: A | Discharge status: Home / Self Care | Payer: Medicare Other | Source: Ambulatory Visit | Attending: Cardiovascular Disease | Admitting: Cardiovascular Disease

## 2020-04-05 ENCOUNTER — Ambulatory Visit (HOSPITAL_COMMUNITY)
Admission: RE | Admit: 2020-04-05 | Discharge: 2020-04-05 | Disposition: A | Discharge status: Home / Self Care | Payer: Medicare Other | Source: Ambulatory Visit | Attending: Cardiovascular Disease | Admitting: Cardiovascular Disease

## 2020-04-05 ENCOUNTER — Other Ambulatory Visit (HOSPITAL_COMMUNITY): Payer: Self-pay | Admitting: Cardiovascular Disease

## 2020-04-05 ENCOUNTER — Other Ambulatory Visit: Payer: Self-pay

## 2020-04-05 DIAGNOSIS — Z95828 Presence of other vascular implants and grafts: Secondary | ICD-10-CM

## 2020-04-05 DIAGNOSIS — I739 Peripheral vascular disease, unspecified: Secondary | ICD-10-CM

## 2020-04-15 ENCOUNTER — Other Ambulatory Visit (HOSPITAL_COMMUNITY): Payer: Self-pay | Admitting: Cardiology

## 2020-04-15 ENCOUNTER — Encounter (HOSPITAL_COMMUNITY): Payer: Medicare Other

## 2020-04-19 ENCOUNTER — Ambulatory Visit (INDEPENDENT_AMBULATORY_CARE_PROVIDER_SITE_OTHER): Payer: Medicare Other | Admitting: Cardiovascular Disease

## 2020-04-19 ENCOUNTER — Other Ambulatory Visit: Payer: Self-pay

## 2020-04-19 ENCOUNTER — Encounter: Payer: Self-pay | Admitting: Cardiovascular Disease

## 2020-04-19 VITALS — BP 108/70 | HR 68 | Ht 70.0 in | Wt 140.2 lb

## 2020-04-19 DIAGNOSIS — Z720 Tobacco use: Secondary | ICD-10-CM | POA: Diagnosis not present

## 2020-04-19 DIAGNOSIS — I5022 Chronic systolic (congestive) heart failure: Secondary | ICD-10-CM | POA: Diagnosis not present

## 2020-04-19 DIAGNOSIS — E785 Hyperlipidemia, unspecified: Secondary | ICD-10-CM

## 2020-04-19 DIAGNOSIS — I10 Essential (primary) hypertension: Secondary | ICD-10-CM | POA: Diagnosis not present

## 2020-04-19 DIAGNOSIS — I739 Peripheral vascular disease, unspecified: Secondary | ICD-10-CM

## 2020-04-19 DIAGNOSIS — I2510 Atherosclerotic heart disease of native coronary artery without angina pectoris: Secondary | ICD-10-CM

## 2020-04-19 NOTE — Progress Notes (Signed)
Cardiology Office Note   Date:  04/19/2020   ID:  Nicholas Herrera, DOB 04/03/1941, MRN [id number]  PCP:  Jonathon Jordan, MD  Cardiologist:  Dr. Tamala Julian  No chief complaint on file.     History of Present Illness: Nicholas Herrera is a 79 y.o. male who is here today for a follow-up visit regarding peripheral arterial disease. He has known history of coronary artery disease, chronic systolic/diastolic heart failure, tobacco use and hypertension. He has known history of peripheral arterial disease with previous revascularization of the right proximal popliteal artery using a 6 x 100 mm self-expanding stent in 2014. Subsequent studies showed occluded right popliteal artery stent with 2 vessel runoff below the knee and mildly reduced ABI. He was seen in 2019 for severe left calf claudication. Angiography showed focal occlusion in the distal left SFA which was heavily calcified.  It was treated successfully with orbital atherectomy and drug-coated balloon angioplasty.      He developed recurrent left calf claudication in 2020.  Doppler studies  showed patent left SFA.  However, there was new severe stenosis in the distal left common iliac artery extending into the external iliac artery with velocities above 600.  Angiography in September 2020 showed moderate right common iliac, external iliac and common femoral artery disease.  On the left, there was severe stenosis affecting the distal common iliac artery and distal external iliac artery.  I performed successful drug-coated balloon angioplasty to the left external and common iliac arteries.  He had recent worsening of right calf claudication with evidence of worsening iliac disease.  I proceeded with angiography in May which showed severe calcified stenosis affecting the right common iliac and external iliac arteries.  He reports resolution of right calf claudication.  Postprocedure duplex showed relatively normal velocities.  He has known low ABI  on the right due to occluded popliteal artery.  No chest pain or worsening dyspnea.  He does complain of intermittent dizziness. He has not been able to quit smoking.   Past Medical History:  Diagnosis Date  . Abdominal distention   . Abdominal pain   . Bronchitis    hx of  . Cancer ([place])    skin  . CHF (congestive heart failure) (HCC)     (hx of EF 27%) s/p AICD 2004. Most recent LVEF > 40% , 8/11  . Chronic kidney disease    has small stones, no treatment  . Cough   . Depression    takes paxil  . Diverticulosis    hx of  . Dysrhythmia   . Easy bruising   . Hematuria    hx of  . Hernia october 2012   [facility]  . HTN (hypertension)   . Hyperlipidemia    takes zocor  . Inguinal hernia   . Left groin pain     Past Surgical History:  Procedure Laterality Date  . ABDOMINAL AORTOGRAM W/LOWER EXTREMITY N/A 09/03/2018   Procedure: ABDOMINAL AORTOGRAM W/LOWER EXTREMITY;  Surgeon: Wellington Hampshire, MD;  Location: [place] [facility];  Service: Cardiovascular;  Laterality: N/A;  . ABDOMINAL AORTOGRAM W/LOWER EXTREMITY N/A 07/29/2019   Procedure: ABDOMINAL AORTOGRAM W/LOWER EXTREMITY;  Surgeon: Wellington Hampshire, MD;  Location: [place] [facility];  Service: Cardiovascular;  Laterality: N/A;  . ABDOMINAL AORTOGRAM W/LOWER EXTREMITY Bilateral 03/23/2020   Procedure: ABDOMINAL AORTOGRAM W/LOWER EXTREMITY;  Surgeon: Wellington Hampshire, MD;  Location: [place] [facility];  Service: Cardiovascular;  Laterality: Bilateral;  . APPENDECTOMY    .  CARDIAC CATHETERIZATION    . CARDIAC DEFIBRILLATOR REMOVAL  2005  . HERNIA REPAIR  09/11/11   repair of LIH   . INGUINAL HERNIA REPAIR  09/11/2011   Procedure: HERNIA REPAIR INGUINAL ADULT;  Surgeon: Judieth Keens, DO;  Location: [place];  Service: General;  Laterality: Left;  open left inguinal hernia with mesh  . LOWER EXTREMITY ANGIOGRAM N/A 02/05/2013   Procedure: LOWER EXTREMITY ANGIOGRAM;  Surgeon: Jettie Booze, MD;  Location: [facility];   Service: Cardiovascular;  Laterality: N/A;  . PERIPHERAL VASCULAR BALLOON ANGIOPLASTY  07/29/2019   Procedure: PERIPHERAL VASCULAR BALLOON ANGIOPLASTY;  Surgeon: Wellington Hampshire, MD;  Location: [place] [facility];  Service: Cardiovascular;;  . PERIPHERAL VASCULAR INTERVENTION Left 09/03/2018   Procedure: PERIPHERAL VASCULAR INTERVENTION;  Surgeon: Wellington Hampshire, MD;  Location: [place] [facility];  Service: Cardiovascular;  Laterality: Left;  . PERIPHERAL VASCULAR INTERVENTION Right 03/23/2020   Procedure: PERIPHERAL VASCULAR INTERVENTION;  Surgeon: Wellington Hampshire, MD;  Location: [place] [facility];  Service: Cardiovascular;  Laterality: Right;  . RIGHT/LEFT HEART CATH AND CORONARY ANGIOGRAPHY N/A 12/15/2019   Procedure: RIGHT/LEFT HEART CATH AND CORONARY ANGIOGRAPHY;  Surgeon: [name], MD;  Location: [place] [facility];  Service: Cardiovascular;  Laterality: N/A;     Current Outpatient Medications  Medication Sig Dispense Refill  . aspirin (ASPIRIN CHILDRENS) 81 MG chewable tablet Chew 1 tablet (81 mg total) by mouth daily. 36 tablet 11  . carvedilol (COREG) 12.5 MG tablet TAKE 1.5 TABLETS (18.75 MG TOTAL) BY MOUTH 2 (TWO) TIMES DAILY WITH A MEAL. 270 tablet 3  . clopidogrel (PLAVIX) 75 MG tablet Take 1 tablet (75 mg total) by mouth daily with breakfast. 30 tablet 11  . diphenhydramine-acetaminophen (TYLENOL PM) 25-500 MG TABS tablet Take 2 tablets by mouth at bedtime.    . empagliflozin (JARDIANCE) 10 MG TABS tablet Take 10 mg by mouth daily before breakfast. 30 tablet 11  . FIBER PO Take 1 capsule by mouth 2 (two) times daily.    . mirabegron ER (MYRBETRIQ) 50 MG TB24 tablet Take 50 mg by mouth daily.    . Multiple Vitamins-Minerals (ONE-A-DAY MENS 50+ ADVANTAGE PO) Take 1 tablet by mouth daily.     [name] PARoxetine (PAXIL) 40 MG tablet Take 40 mg by mouth daily.    . rosuvastatin (CRESTOR) 40 MG tablet Take 1 tablet (40 mg total) by mouth daily. 90 tablet 3  . sacubitril-valsartan  (ENTRESTO) 97-103 MG Take 1 tablet by mouth 2 (two) times daily. 60 tablet 5  . sodium chloride (OCEAN) 0.65 % SOLN nasal spray Place 1 spray into both nostrils daily as needed for congestion.    [name] spironolactone (ALDACTONE) 25 MG tablet Take 1 tablet (25 mg total) by mouth at bedtime. 90 tablet 3  . venlafaxine XR (EFFEXOR-XR) 75 MG 24 hr capsule Take 75 mg by mouth daily.    [name] ezetimibe (ZETIA) 10 MG tablet Take 1 tablet (10 mg total) by mouth daily. 90 tablet 3   No current facility-administered medications for this visit.    Allergies:   Sulfa antibiotics    Social History:  The patient  reports that he has been smoking cigars. He has a 48.00 pack-year smoking history. He has never used smokeless tobacco. He reports current alcohol use of about 7.0 standard drinks of alcohol per week. He reports that he does not use drugs.   Family History:  The patient's family history includes Cancer in his  mother.    ROS:  Please see the history of present illness.   Otherwise, review of systems are positive for none.   All other systems are reviewed and negative.    PHYSICAL EXAM: VS:  BP 108/70   Pulse 68   Ht 5\' 10"  (1.778 m)   Wt 140 lb 3.2 oz (63.6 kg)   SpO2 97%   BMI 20.12 kg/m  , BMI Body mass index is 20.12 kg/m. GEN: Well nourished, well developed, in no acute distress  HEENT: normal  Neck: no JVD, carotid bruits, or masses Cardiac: RRR; no murmurs, rubs, or gallops,no edema  Respiratory:  clear to auscultation bilaterally, normal work of breathing GI: soft, nontender, nondistended, + BS MS: no deformity or atrophy  Skin: warm and dry, no rash Neuro:  Strength and sensation are intact Psych: euthymic mood, full affect Vascular: Femoral pulse: Slightly diminished bilaterally.  EKG:  EKG is ordered today. EKG showed sinus bradycardia with nonspecific T wave changes.  Recent Labs: 03/15/2020: Hemoglobin 14.5; Platelets 187 03/30/2020: BUN 12; Creatinine, Ser 0.86; Potassium  4.5; Sodium 138    Lipid Panel    Component Value Date/Time   CHOL 115 02/19/2020 0914   TRIG 67 02/19/2020 0914   HDL 50 02/19/2020 0914   CHOLHDL 2.3 02/19/2020 0914   CHOLHDL 3.0 11/20/2019 1231   VLDL 28 11/20/2019 1231   LDLCALC 51 02/19/2020 0914      Wt Readings from Last 3 Encounters:  04/19/20 140 lb 3.2 oz (63.6 kg)  03/30/20 142 lb 12.8 oz (64.8 kg)  03/23/20 145 lb (65.8 kg)       No flowsheet data found.    ASSESSMENT AND PLAN:   1.  Peripheral arterial disease: Status post bilateral endovascular intervention on iliac arteries.  He reports resolution of right calf claudication.  He continues to have chronically occluded right popliteal artery which will be treated medically for now.  Continue dual antiplatelet therapy.  Repeat ABI and aortoiliac duplex in December.     2. Tobacco use: I again discussed with him the importance of smoking cessation but he reports inability to quit.  3. Hyperlipidemia: He is currently on high-dose rosuvastatin and Zetia.  Most recent lipid profile in April showed significant improvement in LDL down to 51.  4. Coronary artery disease: No anginal symptoms.   5. Chronic systolic heart failure: He is followed by the heart failure clinic with improvement in symptoms after adjusting his medications.  He is complaining of increased dizziness which might be due to intermittent hypotension.  I asked him to check his blood pressure frequently and discussed the readings with Dr. Aundra Dubin.  6.  Essential hypertension: Blood pressure is reasonably controlled on current medications    Disposition:   FU with me in 6 months  Signed,  Kathlyn Sacramento, MD  04/19/2020 8:29 AM    [place]

## 2020-04-19 NOTE — Patient Instructions (Signed)

## 2020-05-31 DIAGNOSIS — R42 Dizziness and giddiness: Secondary | ICD-10-CM | POA: Diagnosis not present

## 2020-05-31 DIAGNOSIS — N6320 Unspecified lump in the left breast, unspecified quadrant: Secondary | ICD-10-CM | POA: Diagnosis not present

## 2020-05-31 DIAGNOSIS — E1169 Type 2 diabetes mellitus with other specified complication: Secondary | ICD-10-CM | POA: Diagnosis not present

## 2020-06-03 ENCOUNTER — Other Ambulatory Visit (HOSPITAL_COMMUNITY): Payer: Self-pay | Admitting: Cardiology

## 2020-06-29 ENCOUNTER — Ambulatory Visit (HOSPITAL_COMMUNITY)
Admission: RE | Admit: 2020-06-29 | Discharge: 2020-06-29 | Disposition: A | Discharge status: Home / Self Care | Payer: Medicare Other | Source: Ambulatory Visit | Attending: Cardiology | Admitting: Cardiology

## 2020-06-29 ENCOUNTER — Other Ambulatory Visit: Payer: Self-pay

## 2020-06-29 ENCOUNTER — Encounter (HOSPITAL_COMMUNITY): Payer: Self-pay | Admitting: Cardiology

## 2020-06-29 VITALS — BP 124/88 | HR 54 | Ht 70.0 in | Wt 145.0 lb

## 2020-06-29 DIAGNOSIS — Z955 Presence of coronary angioplasty implant and graft: Secondary | ICD-10-CM | POA: Insufficient documentation

## 2020-06-29 DIAGNOSIS — Z7984 Long term (current) use of oral hypoglycemic drugs: Secondary | ICD-10-CM | POA: Diagnosis not present

## 2020-06-29 DIAGNOSIS — I739 Peripheral vascular disease, unspecified: Secondary | ICD-10-CM | POA: Diagnosis not present

## 2020-06-29 DIAGNOSIS — I2510 Atherosclerotic heart disease of native coronary artery without angina pectoris: Secondary | ICD-10-CM | POA: Diagnosis not present

## 2020-06-29 DIAGNOSIS — E1151 Type 2 diabetes mellitus with diabetic peripheral angiopathy without gangrene: Secondary | ICD-10-CM | POA: Insufficient documentation

## 2020-06-29 DIAGNOSIS — N183 Chronic kidney disease, stage 3 unspecified: Secondary | ICD-10-CM | POA: Insufficient documentation

## 2020-06-29 DIAGNOSIS — I5042 Chronic combined systolic (congestive) and diastolic (congestive) heart failure: Secondary | ICD-10-CM

## 2020-06-29 DIAGNOSIS — Z7902 Long term (current) use of antithrombotics/antiplatelets: Secondary | ICD-10-CM | POA: Insufficient documentation

## 2020-06-29 DIAGNOSIS — I130 Hypertensive heart and chronic kidney disease with heart failure and stage 1 through stage 4 chronic kidney disease, or unspecified chronic kidney disease: Secondary | ICD-10-CM | POA: Insufficient documentation

## 2020-06-29 DIAGNOSIS — Z95810 Presence of automatic (implantable) cardiac defibrillator: Secondary | ICD-10-CM | POA: Insufficient documentation

## 2020-06-29 DIAGNOSIS — Z7982 Long term (current) use of aspirin: Secondary | ICD-10-CM | POA: Insufficient documentation

## 2020-06-29 DIAGNOSIS — F17210 Nicotine dependence, cigarettes, uncomplicated: Secondary | ICD-10-CM | POA: Diagnosis not present

## 2020-06-29 DIAGNOSIS — Z79899 Other long term (current) drug therapy: Secondary | ICD-10-CM | POA: Insufficient documentation

## 2020-06-29 DIAGNOSIS — E1122 Type 2 diabetes mellitus with diabetic chronic kidney disease: Secondary | ICD-10-CM | POA: Insufficient documentation

## 2020-06-29 DIAGNOSIS — I428 Other cardiomyopathies: Secondary | ICD-10-CM | POA: Diagnosis not present

## 2020-06-29 DIAGNOSIS — I5022 Chronic systolic (congestive) heart failure: Secondary | ICD-10-CM | POA: Diagnosis not present

## 2020-06-29 DIAGNOSIS — E785 Hyperlipidemia, unspecified: Secondary | ICD-10-CM | POA: Diagnosis not present

## 2020-06-29 LAB — BASIC METABOLIC PANEL
Anion gap: 9 (ref 5–15)
BUN: 12 mg/dL (ref 8–23)
CO2: 26 mmol/L (ref 22–32)
Calcium: 8.9 mg/dL (ref 8.9–10.3)
Chloride: 102 mmol/L (ref 98–111)
Creatinine, Ser: 0.95 mg/dL (ref 0.61–1.24)
GFR calc Af Amer: >60 mL/min (ref >60)
GFR calc non Af Amer: >60 mL/min (ref >60)
Glucose, Bld: 98 mg/dL (ref 70–99)
Potassium: 4.2 mmol/L (ref 3.5–5.1)
Sodium: 137 mmol/L (ref 135–145)

## 2020-06-29 MED ORDER — EMPAGLIFLOZIN 10 MG PO TABS
10.0000 mg | ORAL_TABLET | Freq: Every day | ORAL | 6 refills | Status: DC
Start: 2020-06-29 — End: 2020-12-08

## 2020-06-29 MED ORDER — EPLERENONE 25 MG PO TABS: 25.0000 mg | ORAL_TABLET | Freq: Every day | ORAL | 6 refills | Status: DC

## 2020-06-29 MED ORDER — CARVEDILOL 12.5 MG PO TABS: 12.5000 mg | ORAL_TABLET | Freq: Two times a day (BID) | ORAL | 3 refills | Status: DC

## 2020-06-29 NOTE — Patient Instructions (Signed)
Restart Jardiance 10 mg Daily  STOP Spironolactone  Start Eplerenone 25 mg Daily, STARTING 07/06/20  Labs done today, your results will be available in MyChart, we will contact you for abnormal readings.  Please call our office in December to schedule your follow up appointment for January 2022.  If you have any questions or concerns before your next appointment please send [place] a message through [place] or call our office at [phone].    TO LEAVE A MESSAGE FOR THE NURSE SELECT OPTION 2, PLEASE LEAVE A MESSAGE INCLUDING: . YOUR NAME . DATE OF BIRTH . CALL BACK NUMBER . REASON FOR CALL**this is important as we prioritize the call backs  [place] AS LONG AS YOU CALL BEFORE 4:00 PM  At the [facility], you and your health needs are our priority. As part of our continuing mission to provide you with exceptional heart care, we have created designated Provider Care Teams. These Care Teams include your primary Cardiologist (physician) and Advanced Practice Providers (APPs- Physician Assistants and Nurse Practitioners) who all work together to provide you with the care you need, when you need it.   You may see any of the following providers on your designated Care Team at your next follow up: [name] Dr Glori Bickers . Dr Loralie Champagne . Darrick Grinder, NP . Lyda Jester, PA . Audry Riles, PharmD   Please be sure to bring in all your medications bottles to every appointment.

## 2020-06-29 NOTE — Progress Notes (Signed)
PCP: Jonathon Jordan, MD Cardiology: Dr. Tamala Julian HF Cardiology: Dr. Aundra Dubin  79 y.o. with PAD, smoking, and a long history of cardiomyopathy was referred by Dr. Tamala Julian for evaluation of CHF/cardiomyopathy.  Patient was diagnosed with a cardiomyopathy prior to 2004 while he was living in [place].  He had an ICD placed in 2004.  Per notes, EF was < 30% during this time.  He thinks he may have had a heart catheterization but does not remember the details.  The ICD subsequently malfunctioned and was turned off in 2009.  It is still implanted and has 2 nonfunctioning leads.  Echo in 2011 after moving the [facility] showed improved EF at 40-45%.  He did not have an echo over the next 9 years.  However, Echo done in 7/20 showed EF down to 20-25% with a moderately dilated LV.  He has been subsequently started on HF meds.  He saw Dr. Lovena Le to decide about explantation of old ICD/implantation of new ICD.  He was thought to be not a candidate for a new intravascular ICD.  Subcutaneous ICD would be a potential option but utility would like be limited with nonischemic cardiomyopathy at age 51.   Patient also has an extensive PAD history.  He still smokes 1/2-1 ppd.  His last peripheral vascular intervention was in 9/20 with PTCA to left external and common iliac arteries.  Currently, he gets right calf claudication after walking longer distances.    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  He was well-compensated.   Peripheral angiography in 5/21 with stent to the right external iliac artery.   He returns today for followup of CHF.  Still smoking. Right calf claudication has resolved since peripheral intervention in 5/21.  He has bilateral nipple tenderness, L>R.  His PCP stopped Jardiance due to lightheadedness. He thinks that he was getting dehydrated due to spending a lot of time outside in the heat this summer. No further lightheadedness.  No significant exertional dyspnea or  chest pain. Weight is stable.   Labs (2/20): LDL 93 Labs (11/20): K 4, creatinine 0.99  Labs (1/21): LDL 89 Labs (2/21): K 4.2, creatinine 0.97 Labs (4/21): LDL 51, HDL 50, K 3.7, creatinine 0.92 Labs (6/21): K 4.5, creatinine 0.86  PMH:  1. HTN 2. Hyperlipidemia 3. Active smoker 4. PAD: Right popliteal stent in 2014 with subsequent occlusion.   - Orbital atherectomy + drug coated balloon angioplasty of left SFA in 2019.   - In 9/20, had PTCA left external and common iliac arteries.  - Peripheral angiogram in 5/21 with stent to right external iliac artery.  5. CKD: Stage 3.  6. Cardiomyopathy: Patient had an ICD placed in [place] in 2004, so presumably had a pre-dating cardiomyopathy.  He thinks that he had a heart cath in [place] but is not sure.  EF <30% when he moved to [place].  - Echo (2011) with EF improved to 40-45%.  - Echo (7/20): EF 20-25%, moderate LV dilation, normal RV size and systolic function.  - Patient has 2 nonfunctioning ICD leads (ICD is not functional, turned off in 2009).  - LHC/RHC (2/21): Nonobstructive CAD.  Mean RA 3, PA 25/5, mean PCWP 7, CI 2.4.  7. Type 2 diabetes  SH: Retired 15 years ago from [employer].  Married with 3 children, smokes about 1/2-1 ppd still, has had about 3 drinks/day for 2-3 years.   FH: Mother with cancer, father with COPD.  No heart disease that he  knows of.   ROS: All systems reviewed and negative except as per HPI.   Current Outpatient Medications  Medication Sig Dispense Refill  . aspirin (ASPIRIN CHILDRENS) 81 MG chewable tablet Chew 1 tablet (81 mg total) by mouth daily. 36 tablet 11  . carvedilol (COREG) 12.5 MG tablet Take 1 tablet (12.5 mg total) by mouth 2 (two) times daily with a meal. 270 tablet 3  . clopidogrel (PLAVIX) 75 MG tablet Take 1 tablet (75 mg total) by mouth daily with breakfast. 30 tablet 11  . diphenhydramine-acetaminophen (TYLENOL PM) 25-500 MG TABS tablet Take 2 tablets by mouth at bedtime.     [name] ENTRESTO 97-103 MG TAKE 1 TABLET BY MOUTH TWICE A DAY 60 tablet 5  . FIBER PO Take 1 capsule by mouth 2 (two) times daily.    . mirabegron ER (MYRBETRIQ) 50 MG TB24 tablet Take 50 mg by mouth daily.    . Multiple Vitamins-Minerals (ONE-A-DAY MENS 50+ ADVANTAGE PO) Take 1 tablet by mouth daily.     [name] PARoxetine (PAXIL) 40 MG tablet Take 40 mg by mouth daily.    . rosuvastatin (CRESTOR) 40 MG tablet Take 1 tablet (40 mg total) by mouth daily. 90 tablet 3  . sodium chloride (OCEAN) 0.65 % SOLN nasal spray Place 1 spray into both nostrils daily as needed for congestion.    [name] venlafaxine XR (EFFEXOR-XR) 75 MG 24 hr capsule Take 75 mg by mouth daily.    . empagliflozin (JARDIANCE) 10 MG TABS tablet Take 1 tablet (10 mg total) by mouth daily before breakfast. 30 tablet 6  . [START ON 07/06/2020] eplerenone (INSPRA) 25 MG tablet Take 1 tablet (25 mg total) by mouth daily. 30 tablet 6  . ezetimibe (ZETIA) 10 MG tablet Take 1 tablet (10 mg total) by mouth daily. 90 tablet 3   No current facility-administered medications for this encounter.   BP 124/88   Pulse (!) 54   Ht 5\' 10"  (1.778 m)   Wt 65.8 kg (145 lb)   SpO2 98%   BMI 20.81 kg/m  General: NAD Neck: No JVD, no thyromegaly or thyroid nodule.  Lungs: Clear to auscultation bilaterally with normal respiratory effort. CV: Nondisplaced PMI.  Heart regular S1/S2, no S3/S4, no murmur.  No peripheral edema.  No carotid bruit. Weak pedal pulses.  Abdomen: Soft, nontender, no hepatosplenomegaly, no distention.  Skin: Intact without lesions or rashes.  Neurologic: Alert and oriented x 3.  Psych: Normal affect. Extremities: No clubbing or cyanosis.  HEENT: Normal.   Assessment/Plan: 1. Chronic systolic CHF:  Patient has had a known cardiomyopathy since prior to 2004.  EF had improved to 40-45% in 2011 but had dropped significantly on echo in 7/20 with EF 20-25%. He has a nonfunctioning ICD.  Cause of the cardiomyopathy is uncertain.  Coronary  angiography in 2/21 showed no significant coronary disease. He has no family history of cardiomyopathy.  RHC in 2/21 showed normal filling pressures and normal cardiac output.  NYHA class II symptoms, he is not volume overloaded on exam.  Weight stable.  No recent lightheadedness.  - Continue Coreg 12.5 mg bid.   - Continue Entresto 97/103 bid.  BMET today.  - Stop spironolactone, suspect he has painful gynecomastia due to spironolactone. After 1 week off spironolactone, he will start eplerenone 25 mg daily.  - Restart Jardiance 10 mg daily but we talked at length about staying hydrated.  - He does not appear to need Lasix.  - Patient is not  a candidate for another intravascular ICD.  Limited utility to implanting subcutaneous ICD in the setting of a nonischemic cardiomyopathy.   2. PAD: Resolution of claudication with 5/21 stent to right EIA.   - Stay active.  - Continue ASA 81 and Crestor 40 mg daily + Zetia 10 mg daily.  - Needs to quit smoking => discussed with patient again today.  3. HTN: BP stable.  4. Hyperlipidemia: Good lipids on Zetia/Crestor in 4/21.    Followup in 3 months.   Loralie Champagne 06/29/2020

## 2020-07-19 DIAGNOSIS — R3915 Urgency of urination: Secondary | ICD-10-CM | POA: Diagnosis not present

## 2020-07-19 DIAGNOSIS — R350 Frequency of micturition: Secondary | ICD-10-CM | POA: Diagnosis not present

## 2020-08-03 DIAGNOSIS — Z23 Encounter for immunization: Secondary | ICD-10-CM | POA: Diagnosis not present

## 2020-08-16 ENCOUNTER — Encounter (HOSPITAL_COMMUNITY): Payer: Self-pay | Admitting: Emergency Medicine

## 2020-08-16 ENCOUNTER — Other Ambulatory Visit: Payer: Self-pay

## 2020-08-16 ENCOUNTER — Emergency Department (HOSPITAL_COMMUNITY): Payer: Medicare Other

## 2020-08-16 ENCOUNTER — Emergency Department (HOSPITAL_COMMUNITY)
Admission: EM | Admit: 2020-08-16 | Discharge: 2020-08-16 | Disposition: A | Discharge status: Home / Self Care | Payer: Medicare Other | Attending: Emergency Medicine | Admitting: Emergency Medicine

## 2020-08-16 DIAGNOSIS — Z79899 Other long term (current) drug therapy: Secondary | ICD-10-CM | POA: Insufficient documentation

## 2020-08-16 DIAGNOSIS — K760 Fatty (change of) liver, not elsewhere classified: Secondary | ICD-10-CM | POA: Diagnosis not present

## 2020-08-16 DIAGNOSIS — Z7902 Long term (current) use of antithrombotics/antiplatelets: Secondary | ICD-10-CM | POA: Diagnosis not present

## 2020-08-16 DIAGNOSIS — K922 Gastrointestinal hemorrhage, unspecified: Secondary | ICD-10-CM

## 2020-08-16 DIAGNOSIS — K625 Hemorrhage of anus and rectum: Secondary | ICD-10-CM | POA: Diagnosis present

## 2020-08-16 DIAGNOSIS — I5042 Chronic combined systolic (congestive) and diastolic (congestive) heart failure: Secondary | ICD-10-CM | POA: Diagnosis not present

## 2020-08-16 DIAGNOSIS — I130 Hypertensive heart and chronic kidney disease with heart failure and stage 1 through stage 4 chronic kidney disease, or unspecified chronic kidney disease: Secondary | ICD-10-CM | POA: Diagnosis not present

## 2020-08-16 DIAGNOSIS — N3289 Other specified disorders of bladder: Secondary | ICD-10-CM | POA: Diagnosis not present

## 2020-08-16 DIAGNOSIS — Z85828 Personal history of other malignant neoplasm of skin: Secondary | ICD-10-CM | POA: Diagnosis not present

## 2020-08-16 DIAGNOSIS — D7389 Other diseases of spleen: Secondary | ICD-10-CM | POA: Diagnosis not present

## 2020-08-16 DIAGNOSIS — R5383 Other fatigue: Secondary | ICD-10-CM | POA: Diagnosis not present

## 2020-08-16 DIAGNOSIS — N189 Chronic kidney disease, unspecified: Secondary | ICD-10-CM | POA: Insufficient documentation

## 2020-08-16 DIAGNOSIS — Z20822 Contact with and (suspected) exposure to covid-19: Secondary | ICD-10-CM | POA: Insufficient documentation

## 2020-08-16 DIAGNOSIS — F17290 Nicotine dependence, other tobacco product, uncomplicated: Secondary | ICD-10-CM | POA: Diagnosis not present

## 2020-08-16 DIAGNOSIS — N281 Cyst of kidney, acquired: Secondary | ICD-10-CM | POA: Diagnosis not present

## 2020-08-16 LAB — CBC
HCT: 44.6 % (ref 39.0–52.0)
Hemoglobin: 14.4 g/dL (ref 13.0–17.0)
MCH: 31.7 pg (ref 26.0–34.0)
MCHC: 32.3 g/dL (ref 30.0–36.0)
MCV: 98.2 fL (ref 80.0–100.0)
Platelets: 177 10*3/uL (ref 150–400)
RBC: 4.54 MIL/uL (ref 4.22–5.81)
RDW: 12.8 % (ref 11.5–15.5)
WBC: 9.2 10*3/uL (ref 4.0–10.5)
nRBC: 0 % (ref 0.0–0.2)

## 2020-08-16 LAB — POC OCCULT BLOOD, ED: Fecal Occult Bld: POSITIVE — AB

## 2020-08-16 LAB — COMPREHENSIVE METABOLIC PANEL
ALT: 25 U/L (ref 0–44)
AST: 31 U/L (ref 15–41)
Albumin: 3.5 g/dL (ref 3.5–5.0)
Alkaline Phosphatase: 73 U/L (ref 38–126)
Anion gap: 13 (ref 5–15)
BUN: 20 mg/dL (ref 8–23)
CO2: 23 mmol/L (ref 22–32)
Calcium: 9.1 mg/dL (ref 8.9–10.3)
Chloride: 102 mmol/L (ref 98–111)
Creatinine, Ser: 1.3 mg/dL — ABNORMAL HIGH (ref 0.61–1.24)
GFR, Estimated: 53 mL/min — ABNORMAL LOW (ref >60)
Glucose, Bld: 116 mg/dL — ABNORMAL HIGH (ref 70–99)
Potassium: 4.3 mmol/L (ref 3.5–5.1)
Sodium: 138 mmol/L (ref 135–145)
Total Bilirubin: 0.5 mg/dL (ref 0.3–1.2)
Total Protein: 6.4 g/dL — ABNORMAL LOW (ref 6.5–8.1)

## 2020-08-16 LAB — TYPE AND SCREEN
ABO/RH(D): O NEG
Antibody Screen: NEGATIVE

## 2020-08-16 LAB — RESPIRATORY PANEL BY RT PCR (FLU A&B, COVID)
Influenza A by PCR: NEGATIVE
Influenza B by PCR: NEGATIVE
SARS Coronavirus 2 by RT PCR: NEGATIVE

## 2020-08-16 MED ORDER — SODIUM CHLORIDE 0.9 % IV BOLUS
500.0000 mL | Freq: Once | INTRAVENOUS | Status: AC
  Administered 2020-08-16: 500 mL via INTRAVENOUS

## 2020-08-16 MED ORDER — IOHEXOL 300 MG/ML  SOLN
100.0000 mL | Freq: Once | INTRAMUSCULAR | Status: AC | PRN
  Administered 2020-08-16: 100 mL via INTRAVENOUS

## 2020-08-16 NOTE — ED Provider Notes (Signed)
[facility] EMERGENCY DEPARTMENT Provider Note   CSN: [id number] Arrival date & time: 08/16/20  1124     History Chief Complaint  Patient presents with  . Rectal Bleeding    Nicholas Herrera is a 79 y.o. male history of diverticulitis, bronchitis, CHF, hypertension, hyperlipidemia on Plavix.  Patient presents today for bright red blood per rectum onset Sunday. He reports Sunday he began having a small amount of nonbloody diarrhea, later on that night he noticed a few small specks of bright red blood in the toilet. He reports however the course of Sunday night and Monday morning he was having diarrhea every hour with increasing amounts of bright red blood seen in the toilet. He reports last night, Monday night the diarrhea and bleeding began to subside, no bleeding during his last bowel movement in the ER today. He reports feeling somewhat fatigued earlier today he feels might be due to the amount of bleeding. He reports he is otherwise feeling well.  He denies fever/chills, headache, syncope, vision changes, chest pain/shortness of breath, dyspnea, cough, abdominal pain, vomiting, melena extremity swelling/color change, numbness/tingling, weakness or any additional concerns.  HPI     Past Medical History:  Diagnosis Date  . Abdominal distention   . Abdominal pain   . Bronchitis    hx of  . Cancer ([place])    skin  . CHF (congestive heart failure) (HCC)     (hx of EF 27%) s/p AICD 2004. Most recent LVEF > 40% , 8/11  . Chronic kidney disease    has small stones, no treatment  . Cough   . Depression    takes paxil  . Diverticulosis    hx of  . Dysrhythmia   . Easy bruising   . Hematuria    hx of  . Hernia october 2012   [facility]  . HTN (hypertension)   . Hyperlipidemia    takes zocor  . Inguinal hernia   . Left groin pain     Patient Active Problem List   Diagnosis Date Noted  . Abscess of sigmoid colon due to diverticulitis 11/12/2016  . Hematuria  11/24/2014  . Chronic combined systolic and diastolic heart failure ([place]) 11/24/2014  . Essential hypertension 11/24/2014  . Peripheral vascular disease ([place]) 09/03/2013  . Tobacco use disorder 09/03/2013  . Hyperlipidemia   . Cancer ([place])   . Dysrhythmia   . Depression   . Hernia 07/30/2011    Past Surgical History:  Procedure Laterality Date  . ABDOMINAL AORTOGRAM W/LOWER EXTREMITY N/A 09/03/2018   Procedure: ABDOMINAL AORTOGRAM W/LOWER EXTREMITY;  Surgeon: Wellington Hampshire, MD;  Location: [place] [facility];  Service: Cardiovascular;  Laterality: N/A;  . ABDOMINAL AORTOGRAM W/LOWER EXTREMITY N/A 07/29/2019   Procedure: ABDOMINAL AORTOGRAM W/LOWER EXTREMITY;  Surgeon: Wellington Hampshire, MD;  Location: [place] [facility];  Service: Cardiovascular;  Laterality: N/A;  . ABDOMINAL AORTOGRAM W/LOWER EXTREMITY Bilateral 03/23/2020   Procedure: ABDOMINAL AORTOGRAM W/LOWER EXTREMITY;  Surgeon: Wellington Hampshire, MD;  Location: [place] [facility];  Service: Cardiovascular;  Laterality: Bilateral;  . APPENDECTOMY    . CARDIAC CATHETERIZATION    . CARDIAC DEFIBRILLATOR REMOVAL  2005  . HERNIA REPAIR  09/11/11   repair of [facility]   . INGUINAL HERNIA REPAIR  09/11/2011   Procedure: HERNIA REPAIR INGUINAL ADULT;  Surgeon: Judieth Keens, DO;  Location: [facility];  Service: General;  Laterality: Left;  open left inguinal hernia with mesh  . LOWER EXTREMITY  ANGIOGRAM N/A 02/05/2013   Procedure: LOWER EXTREMITY ANGIOGRAM;  Surgeon: Jettie Booze, MD;  Location: [facility] CATH LAB;  Service: Cardiovascular;  Laterality: N/A;  . PERIPHERAL VASCULAR BALLOON ANGIOPLASTY  07/29/2019   Procedure: PERIPHERAL VASCULAR BALLOON ANGIOPLASTY;  Surgeon: Wellington Hampshire, MD;  Location: [facility] [facility];  Service: Cardiovascular;;  . PERIPHERAL VASCULAR INTERVENTION Left 09/03/2018   Procedure: PERIPHERAL VASCULAR INTERVENTION;  Surgeon: Wellington Hampshire, MD;  Location: [place] [facility] [facility];  Service: Cardiovascular;   Laterality: Left;  . PERIPHERAL VASCULAR INTERVENTION Right 03/23/2020   Procedure: PERIPHERAL VASCULAR INTERVENTION;  Surgeon: Wellington Hampshire, MD;  Location: [place] [facility];  Service: Cardiovascular;  Laterality: Right;  . RIGHT/LEFT HEART CATH AND CORONARY ANGIOGRAPHY N/A 12/15/2019   Procedure: RIGHT/LEFT HEART CATH AND CORONARY ANGIOGRAPHY;  Surgeon: [name], MD;  Location: [place] [facility];  Service: Cardiovascular;  Laterality: N/A;       Family History  Problem Relation Age of Onset  . Cancer Mother     Social History   Tobacco Use  . Smoking status: Current Every Day Smoker    Packs/day: 1.00    Years: 48.00    Pack years: 48.00    Types: Cigars  . Smokeless tobacco: Never Used  Substance Use Topics  . Alcohol use: Yes    Alcohol/week: 7.0 standard drinks    Types: 7 drink(s) per week  . Drug use: No    Home Medications Prior to Admission medications   Medication Sig Start Date End Date Taking? Authorizing Provider  carvedilol (COREG) 12.5 MG tablet Take 1 tablet (12.5 mg total) by mouth 2 (two) times daily with a meal. 06/29/20  Yes Larey Dresser, MD  clopidogrel (PLAVIX) 75 MG tablet Take 1 tablet (75 mg total) by mouth daily with breakfast. 02/06/13  Yes Jettie Booze, MD  diphenhydramine-acetaminophen (TYLENOL PM) 25-500 MG TABS tablet Take 2 tablets by mouth at bedtime.   Yes [provider]  empagliflozin (JARDIANCE) 10 MG TABS tablet Take 1 tablet (10 mg total) by mouth daily before breakfast. Patient taking differently: Take 10 mg by mouth daily.  06/29/20  Yes Larey Dresser, MD  ENTRESTO 97-103 MG TAKE 1 TABLET BY MOUTH TWICE A DAY Patient taking differently: Take 1 tablet by mouth 2 (two) times daily.  06/06/20  Yes Larey Dresser, MD  eplerenone (INSPRA) 25 MG tablet Take 1 tablet (25 mg total) by mouth daily. 07/06/20  Yes Larey Dresser, MD  ezetimibe (ZETIA) 10 MG tablet Take 1 tablet (10 mg total) by mouth daily. 11/24/19  08/16/20 Yes Hilty, Nadean Corwin, MD  FIBER PO Take 1 capsule by mouth 2 (two) times daily.   Yes [provider]  mirabegron ER (MYRBETRIQ) 50 MG TB24 tablet Take 50 mg by mouth daily.   Yes [provider]  Multiple Vitamin (MULTIVITAMIN WITH MINERALS) TABS tablet Take 1 tablet by mouth daily. One A Day mens 50+   Yes [provider]  PARoxetine (PAXIL) 40 MG tablet Take 40 mg by mouth daily. 10/18/16  Yes [provider]  rosuvastatin (CRESTOR) 40 MG tablet Take 1 tablet (40 mg total) by mouth daily. 08/26/18  Yes Wellington Hampshire, MD  sodium chloride (OCEAN) 0.65 % SOLN nasal spray Place 1 spray into both nostrils daily as needed for congestion.   Yes [provider]  venlafaxine XR (EFFEXOR-XR) 75 MG 24 hr capsule Take 75 mg by mouth daily. 07/13/18  Yes  [provider]  Multiple Vitamins-Minerals (ONE-A-DAY MENS 50+ ADVANTAGE PO) Take 1 tablet by mouth daily.     [provider]    Allergies    Spironolactone and Sulfa antibiotics  Review of Systems   Review of Systems Ten systems are reviewed and are negative for acute change except as noted in the HPI  Physical Exam Updated Vital Signs BP (!) 152/67 (BP Location: Right Arm)   Pulse 63   Temp 98.1 F (36.7 C) (Oral)   Resp 18   Ht 5\' 11"  (1.803 m)   Wt 65.8 kg   SpO2 97%   BMI 20.22 kg/m   Physical Exam Constitutional:      General: He is not in acute distress.    Appearance: Normal appearance. He is well-developed. He is not ill-appearing or diaphoretic.  HENT:     Head: Normocephalic and atraumatic.  Eyes:     General: Vision grossly intact. Gaze aligned appropriately.     Pupils: Pupils are equal, round, and reactive to light.  Neck:     Trachea: Trachea and phonation normal.  Pulmonary:     Effort: Pulmonary effort is normal. No respiratory distress.  Abdominal:     General: There is no distension.     Palpations: Abdomen is soft.     Tenderness:  There is no abdominal tenderness. There is no guarding or rebound.  Genitourinary:    Comments: Rectal examination chaperoned by [facility] RN.  No external hemorrhoids, fissures or other abnormalities. Normal rectal tone. No palpable internal hemorrhoids. No gross blood on examination. No stool in the rectal vault. Musculoskeletal:        General: Normal range of motion.     Cervical back: Normal range of motion.  Skin:    General: Skin is warm and dry.  Neurological:     Mental Status: He is alert.     GCS: GCS eye subscore is 4. GCS verbal subscore is 5. GCS motor subscore is 6.     Comments: Speech is clear and goal oriented, follows commands Major Cranial nerves without deficit, no facial droop Moves extremities without ataxia, coordination intact  Psychiatric:        Behavior: Behavior normal.    ED Results / Procedures / Treatments   Labs (all labs ordered are listed, but only abnormal results are displayed) Labs Reviewed  COMPREHENSIVE METABOLIC PANEL - Abnormal; Notable for the following components:      Result Value   Glucose, Bld 116 (*)    Creatinine, Ser 1.30 (*)    Total Protein 6.4 (*)    GFR, Estimated 53 (*)    All other components within normal limits  POC OCCULT BLOOD, ED - Abnormal; Notable for the following components:   Fecal Occult Bld POSITIVE (*)    All other components within normal limits  RESPIRATORY PANEL BY RT PCR (FLU A&B, COVID)  CBC  TYPE AND SCREEN  ABO/RH    EKG None  Radiology CT ABDOMEN PELVIS W CONTRAST  Result Date: 08/16/2020 CLINICAL DATA:  Bright red blood per rectum EXAM: CT ABDOMEN AND PELVIS WITH CONTRAST TECHNIQUE: Multidetector CT imaging of the abdomen and pelvis was performed using the standard protocol following bolus administration of intravenous contrast. CONTRAST:  176mL OMNIPAQUE IOHEXOL 300 MG/ML  SOLN COMPARISON:  12/06/2016 FINDINGS: Lower chest: No acute abnormality. Hepatobiliary: Fatty infiltration of the liver is  noted. Scattered calcified granulomas are noted. The gallbladder is within normal limits. Pancreas: Unremarkable. No pancreatic ductal  dilatation or surrounding inflammatory changes. Spleen: Multiple calcified granulomas are noted. Adrenals/Urinary Tract: Adrenal glands are within normal limits. Kidneys demonstrate a normal enhancement pattern. Scattered cysts are noted within the left kidney. The largest of these lies in the lower pole measuring 15 mm. The bladder is partially distended. No obstructive changes are seen. Stomach/Bowel: Appendix is not well visualized. Scattered diverticular changes noted without diverticulitis. No inflammatory changes to suggest appendicitis are noted. No obstructive or inflammatory changes of the colon are seen. The stomach and small bowel appear within normal limits. Vascular/Lymphatic: Diffuse atherosclerotic calcifications are noted without aneurysmal dilatation. No sizable adenopathy is noted. Reproductive: Prostate is unremarkable. Other: No abdominal wall hernia or abnormality. No abdominopelvic ascites. Musculoskeletal: No acute or significant osseous findings. IMPRESSION: Changes consistent with prior granulomatous disease. No acute abnormality noted. Chronic changes as described above. Electronically Signed   By: Inez Catalina M.D.   On: 08/16/2020 21:43   DG Chest Portable 1 View  Result Date: 08/16/2020 CLINICAL DATA:  Fatigue EXAM: PORTABLE CHEST 1 VIEW COMPARISON:  09/07/2011, FINDINGS: Left-sided pacing device as before. Stable calcified left lower lung nodule. No focal opacity or pleural effusion. Stable cardiomediastinal silhouette with aortic atherosclerosis. Calcified left hilar and mediastinal lymph nodes consistent with prior granulomatous disease. IMPRESSION: No active disease. Electronically Signed   By: Donavan Foil M.D.   On: 08/16/2020 20:57    Procedures Procedures (including critical care time)  Medications Ordered in ED Medications  sodium  chloride 0.9 % bolus 500 mL (0 mLs Intravenous Stopped 08/16/20 2152)  iohexol (OMNIPAQUE) 300 MG/ML solution 100 mL (100 mLs Intravenous Contrast Given 08/16/20 2131)    ED Course  I have reviewed the triage vital signs and the nursing notes.  Pertinent labs & imaging results that were available during my care of the patient were reviewed by me and considered in my medical decision making (see chart for details).  Clinical Course as of Aug 17 2155  Tue Aug 16, 2020  2040 Treasa School RN   [BM]    Clinical Course User Index [BM] Gari Crown   MDM Rules/Calculators/A&P                         Additional history obtained from: 1. Nursing notes from this visit. 2. Review of electronic medical record. -------------------------------------------- 79 year old male on Plavix presented for bright red blood per rectum onset 2 days ago in the setting of multiple episodes of diarrhea. He is pain-free, no fever or other infectious symptoms. Abdominal examination shows no tenderness guarding or peritoneal signs. Rectal examination shows no hemorrhoids or gross blood/melena on exam, no stool in the rectal vault. Only other symptom is fatigue, denies chest pain or shortness of breath. He denies any other concerns today. - I reviewed and interpreted labs which include: CBC shows no anemia, no leukocytosis to suggest infection. Type and screen O-, antibody negative Hemoccult positive CMP shows no emergent electrolyte derangement, mild elevation of creatinine at 1.3, normal BUN, no LFT elevations or gap.  Chest x-ray:  IMPRESSION:  No active disease.   CTAP:  IMPRESSION:  Changes consistent with prior granulomatous disease.    No acute abnormality noted.    Chronic changes as described above.  - Patient reevaluated resting comfortably no acute distress.  He reports he is feeling well and is agreeable for discharge.  Suspect patient with a diverticular bleed today.  He has no  anemia.  He reports he has  a gastroenterologist here in [place] that he would like to follow-up with I encouraged him to call their office tomorrow morning to schedule follow-up appointment, also encouraged PCP follow-up.  Additionally patient's Covid/influenza panels are pending, he is aware to check his MyChart account for those results tomorrow and discuss them with his PCP when available.  Low suspicion for viral infection at this time.  At this time there does not appear to be any evidence of an acute emergency medical condition and the patient appears stable for discharge with appropriate outpatient follow up. Diagnosis was discussed with patient who verbalizes understanding of care plan and is agreeable to discharge. I have discussed return precautions with patient who verbalizes understanding. Patient encouraged to follow-up with their PCP and GI. All questions answered.  Patient seen and evaulated by Dr. Eulis Foster during this visit who agrees with discharge with outpatient follow-up.  Note: Portions of this report may have been transcribed using voice recognition software. Every effort was made to ensure accuracy; however, inadvertent computerized transcription errors may still be present. Final Clinical Impression(s) / ED Diagnoses Final diagnoses:  Lower GI bleed    Rx / DC Orders ED Discharge Orders    None       Gari Crown 08/16/20 2157    Daleen Bo, MD 08/16/20 2351

## 2020-08-16 NOTE — ED Triage Notes (Addendum)
Patient arrives to ED with complaints of bright red rectal blood per rectum since Sunday. Patient states he had around 10 episodes yesterday and 3 today. Pt states he feels fatigued and lethargic today. Denies abdominal pain, CP, and SOB.

## 2020-08-16 NOTE — Discharge Instructions (Addendum)
At this time there does not appear to be the presence of an emergent medical condition, however there is always the potential for conditions to change. Please read and follow the below instructions.  Please return to the Emergency Department immediately for any new or worsening symptoms or if your symptoms do not improve within 2 days. Call your primary care doctor tomorrow morning to schedule a follow-up appointment Call your gastroenterologist tomorrow morning to schedule follow-up appointment. Your CT scan showed incidental findings which include fatty infiltration of the liver, scattered calcified granulomas, cysts within the left kidney, diverticular changes of the intestine, atherosclerosis.  Your chest x-ray today showed stable calcified left lung nodule, calcified left hilar and mediastinal lymph nodes, atherosclerosis.  Please discuss these incidental findings with your primary care provider at your follow-up visit. Your Covid test and flu tests are currently in process, please check those results on your MyChart account tomorrow morning.  Discussed those results with your primary care provider at your follow-up visit this week.  Go to the nearest Emergency Department immediately if: You have fever or chills Your bleeding does not stop. You feel dizzy or you pass out (faint). You feel weak. You have very bad cramps in your back or belly. You pass large clumps of blood (clots) in your poop. Your symptoms are getting worse. You have chest pain or fast heartbeats. You have any new/concerning or worsening of symptoms  Please read the additional information packets attached to your discharge summary.  Do not take your medicine if  develop an itchy rash, swelling in your mouth or lips, or difficulty breathing; call 911 and seek immediate emergency medical attention if this occurs.  You may review your lab tests and imaging results in their entirety on your MyChart account.  Please discuss  all results of fully with your primary care provider and other specialist at your follow-up visit.  Note: Portions of this text may have been transcribed using voice recognition software. Every effort was made to ensure accuracy; however, inadvertent computerized transcription errors may still be present.

## 2020-08-16 NOTE — ED Provider Notes (Signed)
  Face-to-face evaluation   History: He presents for evaluation of bloody bowel movements following onset of diarrhea.  He has history of diverticulitis.  He denies fever, anorexia, abdominal pain.  Physical exam: Alert, comfortable.  No dysarthria or aphasia.  No respiratory distress.  Medical screening examination/treatment/procedure(s) were conducted as a shared visit with non-physician practitioner(s) and myself.  I personally evaluated the patient during the encounter    Daleen Bo, MD 08/16/20 2352

## 2020-08-23 DIAGNOSIS — K625 Hemorrhage of anus and rectum: Secondary | ICD-10-CM | POA: Diagnosis not present

## 2020-08-23 DIAGNOSIS — I739 Peripheral vascular disease, unspecified: Secondary | ICD-10-CM | POA: Diagnosis not present

## 2020-08-23 DIAGNOSIS — R739 Hyperglycemia, unspecified: Secondary | ICD-10-CM | POA: Insufficient documentation

## 2020-08-23 DIAGNOSIS — I2510 Atherosclerotic heart disease of native coronary artery without angina pectoris: Secondary | ICD-10-CM | POA: Insufficient documentation

## 2020-09-20 ENCOUNTER — Telehealth: Payer: Self-pay | Admitting: Interventional Cardiology

## 2020-09-20 NOTE — Telephone Encounter (Signed)
1. What dental office are you calling from? [facility]   2. What is your office phone number? [phone]   3. What is your fax number? [phone]  4. What type of procedure is the patient having performed? Hygiene Visit (Cleaning)  5. What date is procedure scheduled or is the patient there now? 09/26/20 (if the patient is at the dentist's office question goes to their cardiologist if he/she is in the office.  If not, question should go to the DOD).   6. What is your question (ex. Antibiotics prior to procedure, holding medication-we need to know how long dentist wants pt to hold med)? If any antibiotics are needed prior.

## 2020-09-20 NOTE — Telephone Encounter (Signed)
   Primary Cardiologist: Sinclair Grooms, MD  Chart reviewed as part of pre-operative protocol coverage.   Simple dental extractions are considered low risk procedures per guidelines and generally do not require any specific cardiac clearance. It is also generally accepted that for simple extractions and dental cleanings, there is no need to interrupt blood thinner therapy.  SBE prophylaxis is not required for the patient from a cardiac standpoint.  I will route this recommendation to the requesting party via Epic fax function and remove from pre-op pool.  Please call with questions.  Darreld Mclean, PA-C 09/20/2020, 3:46 PM

## 2020-09-26 NOTE — Progress Notes (Signed)
Cardiology Office Note:    Date:  09/29/2020   ID:  FAWZI MELMAN, DOB May 10, 1941, MRN [id number]  PCP:  Jonathon Jordan, MD  Cardiologist:  Sinclair Grooms, MD   Referring MD: Jonathon Jordan, MD   Chief Complaint  Patient presents with  . Congestive Heart Failure    History of Present Illness:    IZAYIAH TIBBITTS is a 79 y.o. male with a hx of cardiomyopathy was referred by Dr. Tamala Julian for evaluation of CHF/cardiomyopathy.  Patient was diagnosed with a cardiomyopathy prior to 2004 while he was living in [place].  He had an ICD placed in 2004.  Per notes, EF was < 30% during this time.  He thinks he may have had a heart catheterization but does not remember the details.  The ICD subsequently malfunctioned and was turned off in 2009.  It is still implanted and has 2 nonfunctioning leads.  Echo in 2011 after moving the [facility] showed improved EF at 40-45%.  He did not have an echo over the next 9 years.  However, Echo done in 7/20 showed EF down to 20-25% with a moderately dilated LV.  He has been subsequently started on HF meds.  He saw Dr. Lovena Le to decide about explantation of old ICD/implantation of new ICD.  He was thought to be not a candidate for a new intravascular ICD.  Subcutaneous ICD would be a potential option but utility would like be limited with nonischemic cardiomyopathy at age 61.   Has had occasional orthostatic dizziness but generally feels well.  He is following changes in medication regimen per Dr. Aundra Dubin.  He will see Dr. Algernon Huxley in January.  He denies orthopnea, PND, lower extremity swelling, dyspnea on exertion, and is very active.  Past Medical History:  Diagnosis Date  . Abdominal distention   . Abdominal pain   . Bronchitis    hx of  . Cancer ([place])    skin  . CHF (congestive heart failure) (HCC)     (hx of EF 27%) s/p AICD 2004. Most recent LVEF > 40% , 8/11  . Chronic kidney disease    has small stones, no treatment  . Cough   . Depression    takes  paxil  . Diverticulosis    hx of  . Dysrhythmia   . Easy bruising   . Hematuria    hx of  . Hernia october 2012   [facility]  . HTN (hypertension)   . Hyperlipidemia    takes zocor  . Inguinal hernia   . Left groin pain     Past Surgical History:  Procedure Laterality Date  . ABDOMINAL AORTOGRAM W/LOWER EXTREMITY N/A 09/03/2018   Procedure: ABDOMINAL AORTOGRAM W/LOWER EXTREMITY;  Surgeon: Wellington Hampshire, MD;  Location: [place] [facility];  Service: Cardiovascular;  Laterality: N/A;  . ABDOMINAL AORTOGRAM W/LOWER EXTREMITY N/A 07/29/2019   Procedure: ABDOMINAL AORTOGRAM W/LOWER EXTREMITY;  Surgeon: Wellington Hampshire, MD;  Location: [place] [facility];  Service: Cardiovascular;  Laterality: N/A;  . ABDOMINAL AORTOGRAM W/LOWER EXTREMITY Bilateral 03/23/2020   Procedure: ABDOMINAL AORTOGRAM W/LOWER EXTREMITY;  Surgeon: Wellington Hampshire, MD;  Location: [place] [facility];  Service: Cardiovascular;  Laterality: Bilateral;  . APPENDECTOMY    . CARDIAC CATHETERIZATION    . CARDIAC DEFIBRILLATOR REMOVAL  2005  . HERNIA REPAIR  09/11/11   repair of LIH   . INGUINAL HERNIA REPAIR  09/11/2011   Procedure: HERNIA REPAIR INGUINAL ADULT;  Surgeon: Judieth Keens,  DO;  Location: [facility];  Service: General;  Laterality: Left;  open left inguinal hernia with mesh  . LOWER EXTREMITY ANGIOGRAM N/A 02/05/2013   Procedure: LOWER EXTREMITY ANGIOGRAM;  Surgeon: Jettie Booze, MD;  Location: [facility] CATH LAB;  Service: Cardiovascular;  Laterality: N/A;  . PERIPHERAL VASCULAR BALLOON ANGIOPLASTY  07/29/2019   Procedure: PERIPHERAL VASCULAR BALLOON ANGIOPLASTY;  Surgeon: Wellington Hampshire, MD;  Location: [place] [facility];  Service: Cardiovascular;;  . PERIPHERAL VASCULAR INTERVENTION Left 09/03/2018   Procedure: PERIPHERAL VASCULAR INTERVENTION;  Surgeon: Wellington Hampshire, MD;  Location: [place] [facility];  Service: Cardiovascular;  Laterality: Left;  . PERIPHERAL VASCULAR INTERVENTION Right 03/23/2020    Procedure: PERIPHERAL VASCULAR INTERVENTION;  Surgeon: Wellington Hampshire, MD;  Location: [place] [facility];  Service: Cardiovascular;  Laterality: Right;  . RIGHT/LEFT HEART CATH AND CORONARY ANGIOGRAPHY N/A 12/15/2019   Procedure: RIGHT/LEFT HEART CATH AND CORONARY ANGIOGRAPHY;  Surgeon: [name], MD;  Location: [place] [facility];  Service: Cardiovascular;  Laterality: N/A;    Current Medications: Current Meds  Medication Sig  . aspirin 81 MG EC tablet Take 81 mg by mouth daily.  . carvedilol (COREG) 12.5 MG tablet Take 1 tablet (12.5 mg total) by mouth 2 (two) times daily with a meal.  . clopidogrel (PLAVIX) 75 MG tablet Take 1 tablet (75 mg total) by mouth daily with breakfast.  . diphenhydramine-acetaminophen (TYLENOL PM) 25-500 MG TABS tablet Take 2 tablets by mouth at bedtime.  . empagliflozin (JARDIANCE) 10 MG TABS tablet Take 1 tablet (10 mg total) by mouth daily before breakfast.  . ENTRESTO 97-103 MG TAKE 1 TABLET BY MOUTH TWICE A DAY  . eplerenone (INSPRA) 25 MG tablet Take 1 tablet (25 mg total) by mouth daily.  [name] ezetimibe (ZETIA) 10 MG tablet Take 1 tablet (10 mg total) by mouth daily.  . mirabegron ER (MYRBETRIQ) 50 MG TB24 tablet Take 50 mg by mouth daily.  . Multiple Vitamin (MULTIVITAMIN WITH MINERALS) TABS tablet Take 1 tablet by mouth daily. One A Day mens 50+  . Multiple Vitamins-Minerals (ONE-A-DAY MENS 50+ ADVANTAGE PO) Take 1 tablet by mouth daily.   [name] PARoxetine (PAXIL) 40 MG tablet Take 40 mg by mouth daily.  . rosuvastatin (CRESTOR) 40 MG tablet Take 1 tablet (40 mg total) by mouth daily.  . sodium chloride (OCEAN) 0.65 % SOLN nasal spray Place 1 spray into both nostrils daily as needed for congestion.  [name] venlafaxine XR (EFFEXOR-XR) 75 MG 24 hr capsule Take 75 mg by mouth daily.     Allergies:   Spironolactone and Sulfa antibiotics   Social History   Socioeconomic History  . Marital status: Married    Spouse name: Not on file  . Number of children:  Not on file  . Years of education: Not on file  . Highest education level: Not on file  Occupational History  . Not on file  Tobacco Use  . Smoking status: Current Every Day Smoker    Packs/day: 1.00    Years: 48.00    Pack years: 48.00    Types: Cigars  . Smokeless tobacco: Never Used  Substance and Sexual Activity  . Alcohol use: Yes    Alcohol/week: 7.0 standard drinks    Types: 7 drink(s) per week  . Drug use: No  . Sexual activity: Not on file  Other Topics Concern  . Not on file  Social History Narrative  . Not on file   Social Determinants  of Health   Financial Resource Strain:   . Difficulty of Paying Living Expenses: Not on file  Food Insecurity:   . Worried About Charity fundraiser in the Last Year: Not on file  . Ran Out of Food in the Last Year: Not on file  Transportation Needs:   . Lack of Transportation (Medical): Not on file  . Lack of Transportation (Non-Medical): Not on file  Physical Activity:   . Days of Exercise per Week: Not on file  . Minutes of Exercise per Session: Not on file  Stress:   . Feeling of Stress : Not on file  Social Connections:   . Frequency of Communication with Friends and Family: Not on file  . Frequency of Social Gatherings with Friends and Family: Not on file  . Attends Religious Services: Not on file  . Active Member of Clubs or Organizations: Not on file  . Attends Archivist Meetings: Not on file  . Marital Status: Not on file     Family History: The patient's family history includes Cancer in his mother.  ROS:   Please see the history of present illness.    Orthostatic dizziness at times all other systems reviewed and are negative.  EKGs/Labs/Other Studies Reviewed:    The following studies were reviewed today: No new data.  Would not order echo until Dr. Algernon Huxley is ready.  EKG:  EKG not repeated  Recent Labs: 08/16/2020: ALT 25; BUN 20; Creatinine, Ser 1.30; Hemoglobin 14.4; Platelets 177;  Potassium 4.3; Sodium 138  Recent Lipid Panel    Component Value Date/Time   CHOL 115 02/19/2020 0914   TRIG 67 02/19/2020 0914   HDL 50 02/19/2020 0914   CHOLHDL 2.3 02/19/2020 0914   CHOLHDL 3.0 11/20/2019 1231   VLDL 28 11/20/2019 1231   LDLCALC 51 02/19/2020 0914    Physical Exam:    VS:  BP 122/62   Pulse 69   Ht 5\' 11"  (1.803 m)   Wt 144 lb (65.3 kg)   SpO2 98%   BMI 20.08 kg/m     Wt Readings from Last 3 Encounters:  09/29/20 144 lb (65.3 kg)  08/16/20 145 lb (65.8 kg)  06/29/20 145 lb (65.8 kg)     GEN: Healthy-appearing, slender. No acute distress HEENT: Normal NECK: No JVD. LYMPHATICS: No lymphadenopathy CARDIAC:  RRR without murmur, gallop, or edema. VASCULAR:  Normal Pulses. No bruits. RESPIRATORY:  Clear to auscultation without rales, wheezing or rhonchi  ABDOMEN: Soft, non-tender, non-distended, No pulsatile mass, MUSCULOSKELETAL: No deformity  SKIN: Warm and dry NEUROLOGIC:  Alert and oriented x 3 PSYCHIATRIC:  Normal affect   ASSESSMENT:    1. Chronic combined systolic and diastolic heart failure ([place])   2. Coronary artery disease involving native coronary artery of native heart without angina pectoris   3. Hyperlipidemia, unspecified hyperlipidemia type   4. Essential hypertension   5. PAD (peripheral artery disease) ([place])   6. Tobacco use   7. Educated about COVID-19 virus infection    PLAN:    In order of problems listed above:  1. Continue the four pillars of heart failure therapy based upon guideline directed medical therapy.  Follow-up in the advanced heart failure clinic with Dr. Aundra Dubin.  2D Doppler echocardiogram at some point in early 2022.  Patient was given the authority to move appointments with me back so that they are not stacked on top of appointments with the advanced heart failure clinic. 2. Asymptomatic.  Secondary prevention  discussed. 3. Target LDL less than 70 4. Target blood pressure is being achieved.  Less than 130/80  mmHg. 5. No complaints.  He is followed by Dr. Fletcher Anon. 6. We did not discuss smoking.  He has been encouraged not to smoke. 7. Vaccinated and practicing social mitigation.  Overall education and awareness concerning primary/secondary risk prevention was discussed in detail: LDL less than 70, hemoglobin A1c less than 7, blood pressure target less than 130/80 mmHg, >150 minutes of moderate aerobic activity per week, avoidance of smoking, weight control (via diet and exercise), and continued surveillance/management of/for obstructive sleep apnea.    Medication Adjustments/Labs and Tests Ordered: Current medicines are reviewed at length with the patient today.  Concerns regarding medicines are outlined above.  No orders of the defined types were placed in this encounter.  No orders of the defined types were placed in this encounter.   Patient Instructions  Medication Instructions:  Your physician recommends that you continue on your current medications as directed. Please refer to the Current Medication list given to you today.  *If you need a refill on your cardiac medications before your next appointment, please call your pharmacy*   Lab Work: None If you have labs (blood work) drawn today and your tests are completely normal, you will receive your results only by: [name] MyChart Message (if you have MyChart) OR . A paper copy in the mail If you have any lab test that is abnormal or we need to change your treatment, we will call you to review the results.   Testing/Procedures: None   Follow-Up: At [facility], you and your health needs are our priority.  As part of our continuing mission to provide you with exceptional heart care, we have created designated Provider Care Teams.  These Care Teams include your primary Cardiologist (physician) and Advanced Practice Providers (APPs -  Physician Assistants and Nurse Practitioners) who all work together to provide you with the care you need,  when you need it.  We recommend signing up for the patient portal called "MyChart".  Sign up information is provided on this After Visit Summary.  MyChart is used to connect with patients for Virtual Visits (Telemedicine).  Patients are able to view lab/test results, encounter notes, upcoming appointments, etc.  Non-urgent messages can be sent to your provider as well.   To learn more about what you can do with MyChart, go to [email or web address].    Your next appointment:   6 month(s)  The format for your next appointment:   In Person  Provider:   You may see Sinclair Grooms, MD or one of the following Advanced Practice Providers on your designated Care Team:    Truitt Merle, NP  Cecilie Kicks, NP  Kathyrn Drown, NP    Other Instructions      Signed, Sinclair Grooms, MD  09/29/2020 10:59 AM    [place]

## 2020-09-29 ENCOUNTER — Other Ambulatory Visit: Payer: Self-pay

## 2020-09-29 ENCOUNTER — Encounter: Payer: Self-pay | Admitting: Interventional Cardiology

## 2020-09-29 ENCOUNTER — Ambulatory Visit (INDEPENDENT_AMBULATORY_CARE_PROVIDER_SITE_OTHER): Payer: Medicare Other | Admitting: Interventional Cardiology

## 2020-09-29 VITALS — BP 122/62 | HR 69 | Ht 71.0 in | Wt 144.0 lb

## 2020-09-29 DIAGNOSIS — I10 Essential (primary) hypertension: Secondary | ICD-10-CM | POA: Diagnosis not present

## 2020-09-29 DIAGNOSIS — I5042 Chronic combined systolic (congestive) and diastolic (congestive) heart failure: Secondary | ICD-10-CM | POA: Diagnosis not present

## 2020-09-29 DIAGNOSIS — I739 Peripheral vascular disease, unspecified: Secondary | ICD-10-CM

## 2020-09-29 DIAGNOSIS — Z7189 Other specified counseling: Secondary | ICD-10-CM | POA: Diagnosis not present

## 2020-09-29 DIAGNOSIS — Z720 Tobacco use: Secondary | ICD-10-CM | POA: Diagnosis not present

## 2020-09-29 DIAGNOSIS — I2510 Atherosclerotic heart disease of native coronary artery without angina pectoris: Secondary | ICD-10-CM

## 2020-09-29 DIAGNOSIS — E785 Hyperlipidemia, unspecified: Secondary | ICD-10-CM

## 2020-09-29 NOTE — Patient Instructions (Signed)

## 2020-10-05 ENCOUNTER — Telehealth: Payer: Self-pay | Admitting: *Deleted

## 2020-10-05 NOTE — Telephone Encounter (Addendum)
   Primary Cardiologist: Sinclair Grooms, MD  Chart reviewed as part of pre-operative protocol coverage.   Simple dental extractions are considered low risk procedures per guidelines and generally do not require any specific cardiac clearance. It is also generally accepted that for simple extractions and dental cleanings, there is no need to interrupt blood thinner therapy.  SBE prophylaxis is not required for the patient from a cardiac standpoint.  Would limit use of Epinephrine given underlying cardiac conditions.   I will route this recommendation to the requesting party via Epic fax function and remove from pre-op pool.  Please call with questions.  Darreld Mclean, PA-C 10/05/2020, 9:48 AM

## 2020-10-05 NOTE — Telephone Encounter (Signed)
   [facility] Pre-operative Risk Assessment    HEARTCARE STAFF: - Please ensure there is not already an duplicate clearance open for this procedure. - Under Visit Info/Reason for Call, type in Other and utilize the format Clearance MM/DD/YY or Clearance TBD. Do not use dashes or single digits. - If request is for dental extraction, please clarify the # of teeth to be extracted.  Request for surgical clearance:  1. What type of surgery is being performed? 1 TOOTH EXTRACTION; BRIDGE PREPARATION   2. When is this surgery scheduled? 10/10/20   3. What type of clearance is required (medical clearance vs. Pharmacy clearance to hold med vs. Both)? MEDICAL  4. Are there any medications that need to be held prior to surgery and how long? ASA AND PLAVIX    5. Practice name and name of physician performing surgery? [facility]; DR. MATT [facility]   6. What is the office phone number? [phone]   7.   What is the office fax number? [phone]  8.   Anesthesia type (None, local, MAC, general) ? LOCAL WITH EPINEPHRINE    Julaine Hua 10/05/2020, 9:34 AM  _________________________________________________________________   (provider comments below)

## 2020-10-11 ENCOUNTER — Encounter: Payer: Self-pay | Admitting: Cardiovascular Disease

## 2020-10-11 ENCOUNTER — Ambulatory Visit (INDEPENDENT_AMBULATORY_CARE_PROVIDER_SITE_OTHER): Payer: Medicare Other | Admitting: Cardiovascular Disease

## 2020-10-11 ENCOUNTER — Other Ambulatory Visit: Payer: Self-pay

## 2020-10-11 VITALS — BP 140/81 | HR 88 | Ht 70.0 in | Wt 143.2 lb

## 2020-10-11 DIAGNOSIS — I10 Essential (primary) hypertension: Secondary | ICD-10-CM | POA: Diagnosis not present

## 2020-10-11 DIAGNOSIS — I2510 Atherosclerotic heart disease of native coronary artery without angina pectoris: Secondary | ICD-10-CM

## 2020-10-11 DIAGNOSIS — I739 Peripheral vascular disease, unspecified: Secondary | ICD-10-CM

## 2020-10-11 DIAGNOSIS — I5022 Chronic systolic (congestive) heart failure: Secondary | ICD-10-CM

## 2020-10-11 DIAGNOSIS — E785 Hyperlipidemia, unspecified: Secondary | ICD-10-CM

## 2020-10-11 NOTE — Progress Notes (Signed)
Cardiology Office Note   Date:  10/11/2020   ID:  Nicholas Herrera, DOB April 22, 1941, MRN [id number]  PCP:  Nicholas Jordan, MD  Cardiologist:  Dr. Tamala Herrera  No chief complaint on file.     History of Present Illness: Nicholas Herrera is a 79 y.o. male who is here today for a follow-up visit regarding peripheral arterial disease. He has known history of coronary artery disease, chronic systolic/diastolic heart failure, tobacco use and hypertension. He has extensive peripheral arterial disease.  He has known occluded stent in the right proximal popliteal artery.  He is status post left SFA atherectomy and drug-coated balloon angioplasty in 2019, left common iliac and external iliac artery drug-coated balloon angioplasty in 2020.  He is also status post orbital atherectomy and balloon expandable stent placement to the right common iliac artery as well as angioplasty and drug-eluting stent placement to the right external iliac artery.  He was hospitalized in October with lower GI bleed due to diverticulosis.  He continues to have easy bruising with dual antiplatelet therapy.  He denies leg claudication.  No chest pain or shortness of breath.  Past Medical History:  Diagnosis Date  . Abdominal distention   . Abdominal pain   . Bronchitis    hx of  . Cancer ([place])    skin  . CHF (congestive heart failure) (HCC)     (hx of EF 27%) s/p AICD 2004. Most recent LVEF > 40% , 8/11  . Chronic kidney disease    has small stones, no treatment  . Cough   . Depression    takes paxil  . Diverticulosis    hx of  . Dysrhythmia   . Easy bruising   . Hematuria    hx of  . Hernia october 2012   [facility]  . HTN (hypertension)   . Hyperlipidemia    takes zocor  . Inguinal hernia   . Left groin pain     Past Surgical History:  Procedure Laterality Date  . ABDOMINAL AORTOGRAM W/LOWER EXTREMITY N/A 09/03/2018   Procedure: ABDOMINAL AORTOGRAM W/LOWER EXTREMITY;  Surgeon: Wellington Hampshire, MD;   Location: [place] [facility];  Service: Cardiovascular;  Laterality: N/A;  . ABDOMINAL AORTOGRAM W/LOWER EXTREMITY N/A 07/29/2019   Procedure: ABDOMINAL AORTOGRAM W/LOWER EXTREMITY;  Surgeon: Wellington Hampshire, MD;  Location: [place] [facility];  Service: Cardiovascular;  Laterality: N/A;  . ABDOMINAL AORTOGRAM W/LOWER EXTREMITY Bilateral 03/23/2020   Procedure: ABDOMINAL AORTOGRAM W/LOWER EXTREMITY;  Surgeon: Wellington Hampshire, MD;  Location: [place] [facility];  Service: Cardiovascular;  Laterality: Bilateral;  . APPENDECTOMY    . CARDIAC CATHETERIZATION    . CARDIAC DEFIBRILLATOR REMOVAL  2005  . HERNIA REPAIR  09/11/11   repair of LIH   . INGUINAL HERNIA REPAIR  09/11/2011   Procedure: HERNIA REPAIR INGUINAL ADULT;  Surgeon: Judieth Keens, DO;  Location: [place];  Service: General;  Laterality: Left;  open left inguinal hernia with mesh  . LOWER EXTREMITY ANGIOGRAM N/A 02/05/2013   Procedure: LOWER EXTREMITY ANGIOGRAM;  Surgeon: Jettie Booze, MD;  Location: [facility] CATH LAB;  Service: Cardiovascular;  Laterality: N/A;  . PERIPHERAL VASCULAR BALLOON ANGIOPLASTY  07/29/2019   Procedure: PERIPHERAL VASCULAR BALLOON ANGIOPLASTY;  Surgeon: Wellington Hampshire, MD;  Location: [place] [facility] LAB;  Service: Cardiovascular;;  . PERIPHERAL VASCULAR INTERVENTION Left 09/03/2018   Procedure: PERIPHERAL VASCULAR INTERVENTION;  Surgeon: Wellington Hampshire, MD;  Location: [place] [facility];  Service:  Cardiovascular;  Laterality: Left;  . PERIPHERAL VASCULAR INTERVENTION Right 03/23/2020   Procedure: PERIPHERAL VASCULAR INTERVENTION;  Surgeon: Wellington Hampshire, MD;  Location: [place] CV LAB;  Service: Cardiovascular;  Laterality: Right;  . RIGHT/LEFT HEART CATH AND CORONARY ANGIOGRAPHY N/A 12/15/2019   Procedure: RIGHT/LEFT HEART CATH AND CORONARY ANGIOGRAPHY;  Surgeon: [name], MD;  Location: [place] [facility] LAB;  Service: Cardiovascular;  Laterality: N/A;     Current Outpatient Medications   Medication Sig Dispense Refill  . aspirin 81 MG EC tablet Take 81 mg by mouth daily.    . carvedilol (COREG) 12.5 MG tablet Take 1 tablet (12.5 mg total) by mouth 2 (two) times daily with a meal. 270 tablet 3  . clopidogrel (PLAVIX) 75 MG tablet Take 1 tablet (75 mg total) by mouth daily with breakfast. 30 tablet 11  . diphenhydramine-acetaminophen (TYLENOL PM) 25-500 MG TABS tablet Take 2 tablets by mouth at bedtime.    . empagliflozin (JARDIANCE) 10 MG TABS tablet Take 1 tablet (10 mg total) by mouth daily before breakfast. 30 tablet 6  . ENTRESTO 97-103 MG TAKE 1 TABLET BY MOUTH TWICE A DAY 60 tablet 5  . eplerenone (INSPRA) 25 MG tablet Take 1 tablet (25 mg total) by mouth daily. 30 tablet 6  . mirabegron ER (MYRBETRIQ) 50 MG TB24 tablet Take 50 mg by mouth daily.    . Multiple Vitamin (MULTIVITAMIN WITH MINERALS) TABS tablet Take 1 tablet by mouth daily. One A Day mens 50+    . Multiple Vitamins-Minerals (ONE-A-DAY MENS 50+ ADVANTAGE PO) Take 1 tablet by mouth daily.    [name] PARoxetine (PAXIL) 40 MG tablet Take 40 mg by mouth daily.    . rosuvastatin (CRESTOR) 40 MG tablet Take 1 tablet (40 mg total) by mouth daily. 90 tablet 3  . sodium chloride (OCEAN) 0.65 % SOLN nasal spray Place 1 spray into both nostrils daily as needed for congestion.    [name] venlafaxine XR (EFFEXOR-XR) 75 MG 24 hr capsule Take 75 mg by mouth daily.    [name] ezetimibe (ZETIA) 10 MG tablet Take 1 tablet (10 mg total) by mouth daily. 90 tablet 3   No current facility-administered medications for this visit.    Allergies:   Spironolactone and Sulfa antibiotics    Social History:  The patient  reports that he has been smoking cigars. He has a 48.00 pack-year smoking history. He has never used smokeless tobacco. He reports current alcohol use of about 7.0 standard drinks of alcohol per week. He reports that he does not use drugs.   Family History:  The patient's family history includes Cancer in his mother.    ROS:  Please  see the history of present illness.   Otherwise, review of systems are positive for none.   All other systems are reviewed and negative.    PHYSICAL EXAM: VS:  BP 140/81   Pulse 88   Ht 5\' 10"  (1.778 m)   Wt 143 lb 3.2 oz (65 kg)   SpO2 96%   BMI 20.55 kg/m  , BMI Body mass index is 20.55 kg/m. GEN: Well nourished, well developed, in no acute distress  HEENT: normal  Neck: no JVD, carotid bruits, or masses Cardiac: RRR; no murmurs, rubs, or gallops,no edema  Respiratory:  clear to auscultation bilaterally, normal work of breathing GI: soft, nontender, nondistended, + BS MS: no deformity or atrophy  Skin: warm and dry, no rash Neuro:  Strength and sensation are intact Psych: euthymic  mood, full affect Vascular: Femoral pulse: Slightly diminished bilaterally.  EKG:  EKG is not ordered today.   Recent Labs: 08/16/2020: ALT 25; BUN 20; Creatinine, Ser 1.30; Hemoglobin 14.4; Platelets 177; Potassium 4.3; Sodium 138    Lipid Panel    Component Value Date/Time   CHOL 115 02/19/2020 0914   TRIG 67 02/19/2020 0914   HDL 50 02/19/2020 0914   CHOLHDL 2.3 02/19/2020 0914   CHOLHDL 3.0 11/20/2019 1231   VLDL 28 11/20/2019 1231   LDLCALC 51 02/19/2020 0914      Wt Readings from Last 3 Encounters:  10/11/20 143 lb 3.2 oz (65 kg)  09/29/20 144 lb (65.3 kg)  08/16/20 145 lb (65.8 kg)       No flowsheet data found.    ASSESSMENT AND PLAN:   1.  Peripheral arterial disease: Status post bilateral endovascular intervention on iliac arteries.  No recurrent claudication.  Repeat ABI and aortoiliac duplex next month.   Given recent lower GI bleed and excessive bruising, I elected to discontinue aspirin and leave him on Plavix long-term.     2. Tobacco use: I again discussed with him the importance of smoking cessation but he reports inability to quit.  3. Hyperlipidemia: He is currently on high-dose rosuvastatin and Zetia.  Most recent lipid profile in April showed  significant improvement in LDL down to 51.  4. Coronary artery disease: No anginal symptoms.   5. Chronic systolic heart failure: He is followed by the heart failure clinic .  He is currently on optimal medical therapy.  6.  Essential hypertension: Blood pressure is reasonably controlled on current medications    Disposition:   FU with me in 6 months  Signed,  Kathlyn Sacramento, MD  10/11/2020 11:19 AM    [place]

## 2020-10-11 NOTE — Patient Instructions (Signed)
Medication Instructions:  STOP the Aspirin  *If you need a refill on your cardiac medications before your next appointment, please call your pharmacy*   Lab Work: None ordered If you have labs (blood work) drawn today and your tests are completely normal, you will receive your results only by: [name] MyChart Message (if you have MyChart) OR . A paper copy in the mail If you have any lab test that is abnormal or we need to change your treatment, we will call you to review the results.   Testing/Procedures: None ordered   Follow-Up: At [facility], you and your health needs are our priority.  As part of our continuing mission to provide you with exceptional heart care, we have created designated Provider Care Teams.  These Care Teams include your primary Cardiologist (physician) and Advanced Practice Providers (APPs -  Physician Assistants and Nurse Practitioners) who all work together to provide you with the care you need, when you need it.  We recommend signing up for the patient portal called "MyChart".  Sign up information is provided on this After Visit Summary.  MyChart is used to connect with patients for Virtual Visits (Telemedicine).  Patients are able to view lab/test results, encounter notes, upcoming appointments, etc.  Non-urgent messages can be sent to your provider as well.   To learn more about what you can do with MyChart, go to [email or web address].    Your next appointment:   6 month(s)  The format for your next appointment:   In Person  Provider:   Kathlyn Sacramento, MD

## 2020-10-17 DIAGNOSIS — C4442 Squamous cell carcinoma of skin of scalp and neck: Secondary | ICD-10-CM | POA: Diagnosis not present

## 2020-10-17 DIAGNOSIS — D045 Carcinoma in situ of skin of trunk: Secondary | ICD-10-CM | POA: Diagnosis not present

## 2020-10-17 DIAGNOSIS — L570 Actinic keratosis: Secondary | ICD-10-CM | POA: Diagnosis not present

## 2020-10-17 DIAGNOSIS — Z85828 Personal history of other malignant neoplasm of skin: Secondary | ICD-10-CM | POA: Diagnosis not present

## 2020-10-17 DIAGNOSIS — L821 Other seborrheic keratosis: Secondary | ICD-10-CM | POA: Diagnosis not present

## 2020-10-17 DIAGNOSIS — C44222 Squamous cell carcinoma of skin of right ear and external auricular canal: Secondary | ICD-10-CM | POA: Diagnosis not present

## 2020-10-17 DIAGNOSIS — D485 Neoplasm of uncertain behavior of skin: Secondary | ICD-10-CM | POA: Diagnosis not present

## 2020-10-17 DIAGNOSIS — D0471 Carcinoma in situ of skin of right lower limb, including hip: Secondary | ICD-10-CM | POA: Diagnosis not present

## 2020-10-17 DIAGNOSIS — C44329 Squamous cell carcinoma of skin of other parts of face: Secondary | ICD-10-CM | POA: Diagnosis not present

## 2020-11-02 ENCOUNTER — Other Ambulatory Visit (HOSPITAL_COMMUNITY): Payer: Self-pay | Admitting: Cardiovascular Disease

## 2020-11-02 ENCOUNTER — Ambulatory Visit (HOSPITAL_BASED_OUTPATIENT_CLINIC_OR_DEPARTMENT_OTHER)
Admission: RE | Admit: 2020-11-02 | Discharge: 2020-11-02 | Disposition: A | Discharge status: Home / Self Care | Payer: Medicare Other | Source: Ambulatory Visit | Attending: Cardiology | Admitting: Cardiology

## 2020-11-02 ENCOUNTER — Other Ambulatory Visit: Payer: Self-pay

## 2020-11-02 ENCOUNTER — Ambulatory Visit (HOSPITAL_COMMUNITY)
Admission: RE | Admit: 2020-11-02 | Discharge: 2020-11-02 | Disposition: A | Discharge status: Home / Self Care | Payer: Medicare Other | Source: Ambulatory Visit | Attending: Cardiology | Admitting: Cardiology

## 2020-11-02 DIAGNOSIS — Z95828 Presence of other vascular implants and grafts: Secondary | ICD-10-CM

## 2020-11-13 ENCOUNTER — Other Ambulatory Visit (HOSPITAL_COMMUNITY): Payer: Self-pay | Admitting: Cardiology

## 2020-11-13 ENCOUNTER — Other Ambulatory Visit: Payer: Self-pay | Admitting: Internal Medicine

## 2020-11-18 ENCOUNTER — Encounter (HOSPITAL_COMMUNITY): Payer: Medicare Other | Admitting: Cardiology

## 2020-12-06 DIAGNOSIS — H524 Presbyopia: Secondary | ICD-10-CM | POA: Diagnosis not present

## 2020-12-06 DIAGNOSIS — H52223 Regular astigmatism, bilateral: Secondary | ICD-10-CM | POA: Diagnosis not present

## 2020-12-06 DIAGNOSIS — H26493 Other secondary cataract, bilateral: Secondary | ICD-10-CM | POA: Diagnosis not present

## 2020-12-06 DIAGNOSIS — H43813 Vitreous degeneration, bilateral: Secondary | ICD-10-CM | POA: Diagnosis not present

## 2020-12-06 DIAGNOSIS — Z961 Presence of intraocular lens: Secondary | ICD-10-CM | POA: Diagnosis not present

## 2020-12-06 DIAGNOSIS — H35033 Hypertensive retinopathy, bilateral: Secondary | ICD-10-CM | POA: Diagnosis not present

## 2020-12-08 ENCOUNTER — Other Ambulatory Visit (HOSPITAL_COMMUNITY): Payer: Self-pay

## 2020-12-08 MED ORDER — EZETIMIBE 10 MG PO TABS
10.0000 mg | ORAL_TABLET | Freq: Every day | ORAL | 0 refills | Status: DC
Start: 2020-12-08 — End: 2021-11-06

## 2020-12-08 MED ORDER — EMPAGLIFLOZIN 10 MG PO TABS
10.0000 mg | ORAL_TABLET | Freq: Every day | ORAL | 0 refills | Status: DC
Start: 2020-12-08 — End: 2021-07-04

## 2020-12-08 MED ORDER — ENTRESTO 97-103 MG PO TABS
1.0000 | ORAL_TABLET | Freq: Two times a day (BID) | ORAL | 0 refills | Status: DC
Start: 2020-12-08 — End: 2021-05-17

## 2020-12-08 MED ORDER — CARVEDILOL 12.5 MG PO TABS
12.5000 mg | ORAL_TABLET | Freq: Two times a day (BID) | ORAL | 0 refills | Status: DC
Start: 2020-12-08 — End: 2020-12-13

## 2020-12-13 ENCOUNTER — Other Ambulatory Visit (HOSPITAL_COMMUNITY): Payer: Self-pay | Admitting: *Deleted

## 2020-12-13 MED ORDER — CARVEDILOL 12.5 MG PO TABS
12.5000 mg | ORAL_TABLET | Freq: Two times a day (BID) | ORAL | 3 refills | Status: DC
Start: 2020-12-13 — End: 2020-12-14

## 2020-12-14 ENCOUNTER — Other Ambulatory Visit (HOSPITAL_COMMUNITY): Payer: Self-pay

## 2020-12-14 MED ORDER — CARVEDILOL 12.5 MG PO TABS
12.5000 mg | ORAL_TABLET | Freq: Two times a day (BID) | ORAL | 3 refills | Status: DC
Start: 2020-12-14 — End: 2021-08-02

## 2020-12-18 ENCOUNTER — Other Ambulatory Visit (HOSPITAL_COMMUNITY): Payer: Self-pay | Admitting: Cardiology

## 2021-01-09 DIAGNOSIS — Z0000 Encounter for general adult medical examination without abnormal findings: Secondary | ICD-10-CM | POA: Diagnosis not present

## 2021-01-09 DIAGNOSIS — K5730 Diverticulosis of large intestine without perforation or abscess without bleeding: Secondary | ICD-10-CM | POA: Diagnosis not present

## 2021-01-09 DIAGNOSIS — Z7984 Long term (current) use of oral hypoglycemic drugs: Secondary | ICD-10-CM | POA: Diagnosis not present

## 2021-01-09 DIAGNOSIS — N323 Diverticulum of bladder: Secondary | ICD-10-CM | POA: Diagnosis not present

## 2021-01-09 DIAGNOSIS — E1169 Type 2 diabetes mellitus with other specified complication: Secondary | ICD-10-CM | POA: Diagnosis not present

## 2021-01-09 DIAGNOSIS — Z79899 Other long term (current) drug therapy: Secondary | ICD-10-CM | POA: Diagnosis not present

## 2021-01-09 DIAGNOSIS — I10 Essential (primary) hypertension: Secondary | ICD-10-CM | POA: Diagnosis not present

## 2021-01-09 DIAGNOSIS — I739 Peripheral vascular disease, unspecified: Secondary | ICD-10-CM | POA: Diagnosis not present

## 2021-01-09 DIAGNOSIS — I2510 Atherosclerotic heart disease of native coronary artery without angina pectoris: Secondary | ICD-10-CM | POA: Diagnosis not present

## 2021-01-09 DIAGNOSIS — R251 Tremor, unspecified: Secondary | ICD-10-CM | POA: Diagnosis not present

## 2021-01-09 DIAGNOSIS — E785 Hyperlipidemia, unspecified: Secondary | ICD-10-CM | POA: Diagnosis not present

## 2021-01-09 DIAGNOSIS — N200 Calculus of kidney: Secondary | ICD-10-CM | POA: Diagnosis not present

## 2021-01-19 DIAGNOSIS — L821 Other seborrheic keratosis: Secondary | ICD-10-CM | POA: Diagnosis not present

## 2021-01-19 DIAGNOSIS — D045 Carcinoma in situ of skin of trunk: Secondary | ICD-10-CM | POA: Diagnosis not present

## 2021-01-19 DIAGNOSIS — Z85828 Personal history of other malignant neoplasm of skin: Secondary | ICD-10-CM | POA: Diagnosis not present

## 2021-01-19 DIAGNOSIS — L570 Actinic keratosis: Secondary | ICD-10-CM | POA: Diagnosis not present

## 2021-01-19 DIAGNOSIS — D0439 Carcinoma in situ of skin of other parts of face: Secondary | ICD-10-CM | POA: Diagnosis not present

## 2021-01-19 DIAGNOSIS — D485 Neoplasm of uncertain behavior of skin: Secondary | ICD-10-CM | POA: Diagnosis not present

## 2021-01-25 ENCOUNTER — Ambulatory Visit (HOSPITAL_COMMUNITY)
Admission: RE | Admit: 2021-01-25 | Discharge: 2021-01-25 | Disposition: A | Discharge status: Home / Self Care | Payer: Medicare Other | Source: Ambulatory Visit | Attending: Cardiology | Admitting: Cardiology

## 2021-01-25 ENCOUNTER — Other Ambulatory Visit (HOSPITAL_COMMUNITY): Payer: Self-pay | Admitting: Cardiology

## 2021-01-25 ENCOUNTER — Encounter (HOSPITAL_COMMUNITY): Payer: Self-pay | Admitting: Cardiology

## 2021-01-25 ENCOUNTER — Other Ambulatory Visit: Payer: Self-pay

## 2021-01-25 VITALS — BP 118/70 | HR 62 | Wt 140.6 lb

## 2021-01-25 DIAGNOSIS — I5042 Chronic combined systolic (congestive) and diastolic (congestive) heart failure: Secondary | ICD-10-CM

## 2021-01-25 DIAGNOSIS — E785 Hyperlipidemia, unspecified: Secondary | ICD-10-CM | POA: Diagnosis not present

## 2021-01-25 DIAGNOSIS — Z95810 Presence of automatic (implantable) cardiac defibrillator: Secondary | ICD-10-CM | POA: Diagnosis not present

## 2021-01-25 DIAGNOSIS — I493 Ventricular premature depolarization: Secondary | ICD-10-CM

## 2021-01-25 DIAGNOSIS — I2510 Atherosclerotic heart disease of native coronary artery without angina pectoris: Secondary | ICD-10-CM | POA: Insufficient documentation

## 2021-01-25 DIAGNOSIS — Z7984 Long term (current) use of oral hypoglycemic drugs: Secondary | ICD-10-CM | POA: Diagnosis not present

## 2021-01-25 DIAGNOSIS — I130 Hypertensive heart and chronic kidney disease with heart failure and stage 1 through stage 4 chronic kidney disease, or unspecified chronic kidney disease: Secondary | ICD-10-CM | POA: Insufficient documentation

## 2021-01-25 DIAGNOSIS — Z7902 Long term (current) use of antithrombotics/antiplatelets: Secondary | ICD-10-CM | POA: Diagnosis not present

## 2021-01-25 DIAGNOSIS — Z79899 Other long term (current) drug therapy: Secondary | ICD-10-CM | POA: Insufficient documentation

## 2021-01-25 DIAGNOSIS — N183 Chronic kidney disease, stage 3 unspecified: Secondary | ICD-10-CM | POA: Diagnosis not present

## 2021-01-25 DIAGNOSIS — E1151 Type 2 diabetes mellitus with diabetic peripheral angiopathy without gangrene: Secondary | ICD-10-CM | POA: Diagnosis not present

## 2021-01-25 DIAGNOSIS — E1122 Type 2 diabetes mellitus with diabetic chronic kidney disease: Secondary | ICD-10-CM | POA: Diagnosis not present

## 2021-01-25 DIAGNOSIS — I428 Other cardiomyopathies: Secondary | ICD-10-CM | POA: Insufficient documentation

## 2021-01-25 DIAGNOSIS — F17210 Nicotine dependence, cigarettes, uncomplicated: Secondary | ICD-10-CM | POA: Diagnosis not present

## 2021-01-25 DIAGNOSIS — E782 Mixed hyperlipidemia: Secondary | ICD-10-CM

## 2021-01-25 DIAGNOSIS — I5022 Chronic systolic (congestive) heart failure: Secondary | ICD-10-CM | POA: Diagnosis not present

## 2021-01-25 DIAGNOSIS — Z7982 Long term (current) use of aspirin: Secondary | ICD-10-CM | POA: Insufficient documentation

## 2021-01-25 LAB — CBC
HCT: 46.6 % (ref 39.0–52.0)
Hemoglobin: 15.7 g/dL (ref 13.0–17.0)
MCH: 32.7 pg (ref 26.0–34.0)
MCHC: 33.7 g/dL (ref 30.0–36.0)
MCV: 97.1 fL (ref 80.0–100.0)
Platelets: 180 10*3/uL (ref 150–400)
RBC: 4.8 MIL/uL (ref 4.22–5.81)
RDW: 12.9 % (ref 11.5–15.5)
WBC: 7.7 10*3/uL (ref 4.0–10.5)
nRBC: 0 % (ref 0.0–0.2)

## 2021-01-25 LAB — BASIC METABOLIC PANEL
Anion gap: 8 (ref 5–15)
BUN: 13 mg/dL (ref 8–23)
CO2: 29 mmol/L (ref 22–32)
Calcium: 9 mg/dL (ref 8.9–10.3)
Chloride: 102 mmol/L (ref 98–111)
Creatinine, Ser: 1.05 mg/dL (ref 0.61–1.24)
GFR, Estimated: >60 mL/min (ref >60)
Glucose, Bld: 105 mg/dL — ABNORMAL HIGH (ref 70–99)
Potassium: 3.9 mmol/L (ref 3.5–5.1)
Sodium: 139 mmol/L (ref 135–145)

## 2021-01-25 LAB — LIPID PANEL
Cholesterol: 114 mg/dL (ref 0–200)
HDL: 53 mg/dL (ref >40)
LDL Cholesterol: 50 mg/dL (ref 0–99)
Total CHOL/HDL Ratio: 2.2 RATIO
Triglycerides: 56 mg/dL (ref <150)
VLDL: 11 mg/dL (ref 0–40)

## 2021-01-25 MED ORDER — EPLERENONE 50 MG PO TABS
50.0000 mg | ORAL_TABLET | Freq: Every day | ORAL | 3 refills | Status: DC
Start: 2021-01-25 — End: 2022-01-29

## 2021-01-25 NOTE — Progress Notes (Addendum)
PCP: Jonathon Jordan, MD Cardiology: Dr. Tamala Julian HF Cardiology: Dr. Aundra Dubin  80 y.o. with PAD, smoking, and a long history of cardiomyopathy was referred by Dr. Tamala Julian for evaluation of CHF/cardiomyopathy.  Patient was diagnosed with a cardiomyopathy prior to 2004 while he was living in [place].  He had an ICD placed in 2004.  Per notes, EF was < 30% during this time.  He thinks he may have had a heart catheterization but does not remember the details.  The ICD subsequently malfunctioned and was turned off in 2009.  It is still implanted and has 2 nonfunctioning leads.  Echo in 2011 after moving the [facility] showed improved EF at 40-45%.  He did not have an echo over the next 9 years.  However, Echo done in 7/20 showed EF down to 20-25% with a moderately dilated LV.  He has been subsequently started on HF meds.  He saw Dr. Lovena Le to decide about explantation of old ICD/implantation of new ICD.  He was thought to be not a candidate for a new intravascular ICD.  Subcutaneous ICD would be a potential option but utility would like be limited with nonischemic cardiomyopathy at age 36.   Patient also has an extensive PAD history.  In 9/20 he had PTCA to left external and common iliac arteries.    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  He was well-compensated.   Peripheral angiography in 5/21 with stent to the right external iliac artery.   He had presumed diverticular bleeding in 10/21.   He returns today for followup of CHF.  Still smoking. No claudication.   Generally, feeling good.  Not getting a lot of exercise.  Does yardwork.  No significant exertional dyspnea.  No orthopnea/PND.  Painful gynecomastia resolved off spironolactone. No chest pain. Noted to have frequent PVCs today but denies palpitations. Weight down 5 lbs.   ECG (personally reviewed): NSR with frequent PVCs  Labs (2/20): LDL 93 Labs (11/20): K 4, creatinine 0.99  Labs (1/21): LDL 89 Labs  (2/21): K 4.2, creatinine 0.97 Labs (4/21): LDL 51, HDL 50, K 3.7, creatinine 0.92 Labs (6/21): K 4.5, creatinine 0.86 Labs (10/21): K 4.3, creatinine 1.3, hgb 14.4  PMH:  1. HTN 2. Hyperlipidemia 3. Active smoker 4. PAD: Right popliteal stent in 2014 with subsequent occlusion.   - Orbital atherectomy + drug coated balloon angioplasty of left SFA in 2019.   - In 9/20, had PTCA left external and common iliac arteries.  - Peripheral angiogram in 5/21 with stent to right external iliac artery.  - 1/22 peripheral arterial dopplers with significant bilateral iliac disease.  5. CKD: Stage 3.  6. Cardiomyopathy: Patient had an ICD placed in [place] in 2004, so presumably had a pre-dating cardiomyopathy.  He thinks that he had a heart cath in [place] but is not sure.  EF <30% when he moved to [place].  - Echo (2011) with EF improved to 40-45%.  - Echo (7/20): EF 20-25%, moderate LV dilation, normal RV size and systolic function.  - Patient has 2 nonfunctioning ICD leads (ICD is not functional, turned off in 2009).  - LHC/RHC (2/21): Nonobstructive CAD.  Mean RA 3, PA 25/5, mean PCWP 7, CI 2.4.  7. Type 2 diabetes 8. Diverticular bleeding in 10/21.   SH: Retired 15 years ago from [employer].  Married with 3 children, smokes about 1/2-1 ppd still, has had about 3 drinks/day for 2-3 years.   FH: Mother with cancer, father  with COPD.  No heart disease that he knows of.   ROS: All systems reviewed and negative except as per HPI.   Current Outpatient Medications  Medication Sig Dispense Refill  . carvedilol (COREG) 12.5 MG tablet Take 1 tablet (12.5 mg total) by mouth 2 (two) times daily with a meal. 180 tablet 3  . clopidogrel (PLAVIX) 75 MG tablet Take 1 tablet (75 mg total) by mouth daily with breakfast. 30 tablet 11  . diphenhydramine-acetaminophen (TYLENOL PM) 25-500 MG TABS tablet Take 2 tablets by mouth at bedtime.    . empagliflozin (JARDIANCE) 10 MG TABS tablet Take 1 tablet (10  mg total) by mouth daily before breakfast. Needs appointment for further refill. 90 tablet 0  . ezetimibe (ZETIA) 10 MG tablet Take 1 tablet (10 mg total) by mouth daily. Needs appointment for further refill. 90 tablet 0  . mirabegron ER (MYRBETRIQ) 50 MG TB24 tablet Take 50 mg by mouth daily.    . Multiple Vitamin (MULTIVITAMIN WITH MINERALS) TABS tablet Take 1 tablet by mouth daily. One A Day mens 50+    . Multiple Vitamins-Minerals (ONE-A-DAY MENS 50+ ADVANTAGE PO) Take 1 tablet by mouth daily.    [name] PARoxetine (PAXIL) 40 MG tablet Take 40 mg by mouth daily.    . rosuvastatin (CRESTOR) 40 MG tablet Take 1 tablet (40 mg total) by mouth daily. 90 tablet 3  . sacubitril-valsartan (ENTRESTO) 97-103 MG Take 1 tablet by mouth 2 (two) times daily. Needs appointment for further refill. 120 tablet 0  . sodium chloride (OCEAN) 0.65 % SOLN nasal spray Place 1 spray into both nostrils daily as needed for congestion.    [name] venlafaxine XR (EFFEXOR-XR) 75 MG 24 hr capsule Take 75 mg by mouth daily.    [name] eplerenone (INSPRA) 50 MG tablet Take 1 tablet (50 mg total) by mouth daily. 90 tablet 3   No current facility-administered medications for this encounter.   BP 118/70   Pulse 62   Wt 63.8 kg (140 lb 9.6 oz)   SpO2 96%   BMI 20.17 kg/m  General: NAD Neck: No JVD, no thyromegaly or thyroid nodule.  Lungs: Distant BS CV: Nondisplaced PMI.  Heart irregular S1/S2, no S3/S4, no murmur.  No peripheral edema.  No carotid bruit.  Unable to palpate pedal pulses.  Abdomen: Soft, nontender, no hepatosplenomegaly, no distention.  Skin: Intact without lesions or rashes.  Neurologic: Alert and oriented x 3.  Psych: Normal affect. Extremities: No clubbing or cyanosis.  HEENT: Normal.   Assessment/Plan: 1. Chronic systolic CHF:  Patient has had a known cardiomyopathy since prior to 2004.  EF had improved to 40-45% in 2011 but had dropped significantly on echo in 7/20 with EF 20-25%. He has a nonfunctioning ICD.   Cause of the cardiomyopathy is uncertain.  Coronary angiography in 2/21 showed no significant coronary disease. He has no family history of cardiomyopathy.  RHC in 2/21 showed normal filling pressures and normal cardiac output.  He does have frequent PVCs today, ?could PVCs contribute to his cardiomyopathy.  NYHA class I-II symptoms, he is not volume overloaded on exam.  Weight lower.  - 7 day Zio patch to quantify PVCs.  - Continue Coreg 12.5 mg bid.   - Continue Entresto 97/103 bid.  BMET today.  - No spironolactone due to painful gynecomastia.  Increase eplerenone to 50 mg daily, BMET in 10 days.  - Continue Jardiance 10 mg daily.  - He does not appear to need Lasix.  -  Patient is not a candidate for another intravascular ICD.  Limited utility to implanting subcutaneous ICD in the setting of a nonischemic cardiomyopathy.   - I will arrange for repeat echo.  2. PAD: Resolution of claudication with 5/21 stent to right EIA.   - Stay active.  - Continue ASA 81 and Crestor 40 mg daily + Zetia 10 mg daily.  - Needs to quit smoking => he has been unable to do this.  3. HTN: BP stable.  4. Hyperlipidemia: Check lipids today.   Followup in 3 months.   Loralie Champagne 01/25/2021

## 2021-01-25 NOTE — Addendum Note (Signed)
Encounter addended by: Larey Dresser, MD on: 01/25/2021 4:41 PM  Actions taken: Clinical Note Signed

## 2021-01-25 NOTE — Progress Notes (Signed)
Zio patch placed onto patient.  All instructions and information reviewed with patient, they verbalize understanding with no questions. 

## 2021-01-25 NOTE — Patient Instructions (Addendum)
EKG done today.  Labs done today. We will contact you only if your labs are abnormal.  INCREASE Eplerenone (Inspra) to 50mg  (1 tablet) by mouth daily.  No other medication changes were made. Please continue all current medications as prescribed.  Your physician recommends that you schedule a follow-up appointment soon for an echo, in 10 days for a lab only appointment and in 3 months for an appointment with Dr. Aundra Dubin.   Your provider has recommended that  you wear a Zio Patch for 7 days.  This monitor will record your heart rhythm for our review.  IF you have any symptoms while wearing the monitor please press the button.  If you have any issues with the patch or you notice a red or orange light on it please call the company at [phone].  Once you remove the patch please mail it back to the company as soon as possible so we can get the results.  Your physician has requested that you have an echocardiogram. Echocardiography is a painless test that uses sound waves to create images of your heart. It provides your doctor with information about the size and shape of your heart and how well your heart's chambers and valves are working. This procedure takes approximately one hour. There are no restrictions for this procedure.  If you have any questions or concerns before your next appointment please send [place] a message through [place] or call our office at [phone].    TO LEAVE A MESSAGE FOR THE NURSE SELECT OPTION 2, PLEASE LEAVE A MESSAGE INCLUDING: . YOUR NAME . DATE OF BIRTH . CALL BACK NUMBER . REASON FOR CALL**this is important as we prioritize the call backs  YOU WILL RECEIVE A CALL BACK THE SAME DAY AS LONG AS YOU CALL BEFORE 4:00 PM   Do the following things EVERYDAY: 1) Weigh yourself in the morning before breakfast. Write it down and keep it in a log. 2) Take your medicines as prescribed 3) Eat low salt foods--Limit salt (sodium) to 2000 mg per day.  4) Stay as active as  you can everyday 5) Limit all fluids for the day to less than 2 liters   At the [facility], you and your health needs are our priority. As part of our continuing mission to provide you with exceptional heart care, we have created designated Provider Care Teams. These Care Teams include your primary Cardiologist (physician) and Advanced Practice Providers (APPs- Physician Assistants and Nurse Practitioners) who all work together to provide you with the care you need, when you need it.   You may see any of the following providers on your designated Care Team at your next follow up: [name] Dr Glori Bickers . Dr Loralie Champagne . Darrick Grinder, NP . Lyda Jester, PA . Audry Riles, PharmD   Please be sure to bring in all your medications bottles to every appointment.

## 2021-01-27 ENCOUNTER — Other Ambulatory Visit (HOSPITAL_COMMUNITY): Payer: Self-pay | Admitting: Cardiology

## 2021-02-07 ENCOUNTER — Other Ambulatory Visit: Payer: Self-pay | Admitting: Cardiology

## 2021-02-07 ENCOUNTER — Other Ambulatory Visit: Payer: Self-pay

## 2021-02-07 DIAGNOSIS — I5042 Chronic combined systolic (congestive) and diastolic (congestive) heart failure: Secondary | ICD-10-CM

## 2021-02-07 LAB — BASIC METABOLIC PANEL
BUN/Creatinine Ratio: 15 (ref 10–24)
BUN: 16 mg/dL (ref 8–27)
CO2: 23 mmol/L (ref 20–29)
Calcium: 9 mg/dL (ref 8.6–10.2)
Chloride: 100 mmol/L (ref 96–106)
Creatinine, Ser: 1.08 mg/dL (ref 0.76–1.27)
Glucose: 110 mg/dL — ABNORMAL HIGH (ref 65–99)
Potassium: 4.1 mmol/L (ref 3.5–5.2)
Sodium: 138 mmol/L (ref 134–144)
eGFR: 70 mL/min/{1.73_m2} (ref >59)

## 2021-02-08 DIAGNOSIS — I493 Ventricular premature depolarization: Secondary | ICD-10-CM | POA: Diagnosis not present

## 2021-02-10 ENCOUNTER — Other Ambulatory Visit: Payer: Self-pay

## 2021-02-10 ENCOUNTER — Ambulatory Visit (HOSPITAL_COMMUNITY)
Admission: RE | Admit: 2021-02-10 | Discharge: 2021-02-10 | Disposition: A | Discharge status: Home / Self Care | Payer: Medicare Other | Source: Ambulatory Visit | Attending: Family Medicine | Admitting: Family Medicine

## 2021-02-10 DIAGNOSIS — I5042 Chronic combined systolic (congestive) and diastolic (congestive) heart failure: Secondary | ICD-10-CM | POA: Diagnosis not present

## 2021-02-10 NOTE — Progress Notes (Signed)
  Echocardiogram 2D Echocardiogram has been performed.  Nicholas Herrera 02/10/2021, 11:09 AM

## 2021-02-14 ENCOUNTER — Telehealth (HOSPITAL_COMMUNITY): Payer: Self-pay | Admitting: *Deleted

## 2021-02-14 LAB — ECHOCARDIOGRAM COMPLETE
Calc EF: 28.4 %
S' Lateral: 4.9 cm
Single Plane A2C EF: 24.8 %
Single Plane A4C EF: 34.4 %

## 2021-02-14 MED ORDER — AMIODARONE HCL 200 MG PO TABS: ORAL_TABLET | ORAL | 3 refills | Status: DC

## 2021-02-14 NOTE — Telephone Encounter (Signed)
-----   Message from Larey Dresser, MD sent at 02/12/2021 11:25 PM EDT ----- Very frequent PVCs, may contribute to his cardiomyopathy.  Start amiodarone 200 mg bid x 10 days then 200 mg daily.  Will need repeat Zio patch monitor once he has been on amiodarone for a couple of months.

## 2021-02-14 NOTE — Telephone Encounter (Signed)
Pt and wife aware, agreeable, and verbalized understanding, rx sent in, pt sch for f/u on 7/5 advised we will discuss repeating monitor at that time

## 2021-03-13 DIAGNOSIS — Z23 Encounter for immunization: Secondary | ICD-10-CM | POA: Diagnosis not present

## 2021-04-02 NOTE — Progress Notes (Signed)
Cardiology Office Note:    Date:  04/03/2021   ID:  Nicholas Herrera, DOB [birth year], MRN [id number]  PCP:  Nicholas Jordan, MD  Cardiologist:  Nicholas Grooms, MD   Referring MD: Nicholas Jordan, MD   Chief Complaint  Patient presents with  . Coronary Artery Disease  . Claudication    History of Present Illness:    Nicholas Herrera is a 80 y.o. 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     He is doing well.  He continues to smoke cigarettes.  He is not having claudication.  He denies shortness of breath.  He has been out both walking and working in his yard without significant difficulty.  He is tolerating amiodarone which was recently started to suppress PVCs.  Most recent LVEF 30%.  No syncope or palpitations.  Is now being comanaged with Dr. Aundra Herrera.  Significant PVC burden was identified and is being appropriately treated.  He saw Dr. Lovena Herrera who felt that explantation of current device carries more risk than benefit.  A subcu ICD would be appropriate although the patient at this point is not interested.  Past Medical History:  Diagnosis Date  . Abdominal distention   . Abdominal pain   . Bronchitis    hx of  . Cancer ([place])    skin  . CHF (congestive heart failure) (HCC)     (hx of EF 27%) s/p AICD 2004. Most recent LVEF > 40% , 8/11  . Chronic kidney disease    has small stones, no treatment  . Cough   . Depression    takes paxil  . Diverticulosis    hx of  . Dysrhythmia   . Easy bruising   . Hematuria    hx of  . Hernia october 2012   [facility]  . HTN (hypertension)   . Hyperlipidemia    takes zocor  . Inguinal hernia   . Left groin pain      Past Surgical History:  Procedure Laterality Date  . ABDOMINAL AORTOGRAM W/LOWER EXTREMITY N/A 09/03/2018   Procedure: ABDOMINAL AORTOGRAM W/LOWER EXTREMITY;  Surgeon: Wellington Hampshire, MD;  Location: [place] [facility];  Service: Cardiovascular;  Laterality: N/A;  . ABDOMINAL AORTOGRAM W/LOWER EXTREMITY N/A 07/29/2019   Procedure: ABDOMINAL AORTOGRAM W/LOWER EXTREMITY;  Surgeon: Wellington Hampshire, MD;  Location: [place] [facility];  Service: Cardiovascular;  Laterality: N/A;  . ABDOMINAL AORTOGRAM W/LOWER EXTREMITY Bilateral 03/23/2020   Procedure: ABDOMINAL AORTOGRAM W/LOWER EXTREMITY;  Surgeon: Wellington Hampshire, MD;  Location: [place] [facility];  Service: Cardiovascular;  Laterality: Bilateral;  . APPENDECTOMY    . CARDIAC CATHETERIZATION    . CARDIAC DEFIBRILLATOR REMOVAL  2005  . HERNIA REPAIR  09/11/11   repair of [facility]   . INGUINAL HERNIA REPAIR  09/11/2011   Procedure: HERNIA REPAIR INGUINAL ADULT;  Surgeon: Judieth Keens, DO;  Location: [place];  Service: General;  Laterality: Left;  open left inguinal hernia with mesh  . LOWER EXTREMITY ANGIOGRAM N/A 02/05/2013   Procedure: LOWER EXTREMITY ANGIOGRAM;  Surgeon: Jettie Booze, MD;  Location[facility];  Service: Cardiovascular;  Laterality: N/A;  . PERIPHERAL VASCULAR  BALLOON ANGIOPLASTY  07/29/2019   Procedure: PERIPHERAL VASCULAR BALLOON ANGIOPLASTY;  Surgeon: Wellington Hampshire, MD;  Location: [place] [facility];  Service: Cardiovascular;;  . PERIPHERAL VASCULAR INTERVENTION Left 09/03/2018   Procedure: PERIPHERAL VASCULAR INTERVENTION;  Surgeon: Wellington Hampshire, MD;  Location: [place] [facility];  Service: Cardiovascular;  Laterality: Left;  . PERIPHERAL VASCULAR INTERVENTION Right 03/23/2020   Procedure: PERIPHERAL VASCULAR INTERVENTION;  Surgeon: Wellington Hampshire, MD;  Location: [place] [facility];  Service: Cardiovascular;  Laterality: Right;  . RIGHT/LEFT HEART CATH AND CORONARY ANGIOGRAPHY N/A 12/15/2019    Procedure: RIGHT/LEFT HEART CATH AND CORONARY ANGIOGRAPHY;  Surgeon: [name], MD;  Location: [place] [facility];  Service: Cardiovascular;  Laterality: N/A;    Current Medications: Current Meds  Medication Sig  . amiodarone (PACERONE) 200 MG tablet Take 1 tablet (200 mg total) by mouth 2 (two) times daily for 10 days, THEN 1 tablet (200 mg total) daily.  . carvedilol (COREG) 12.5 MG tablet Take 1 tablet (12.5 mg total) by mouth 2 (two) times daily with a meal.  . clopidogrel (PLAVIX) 75 MG tablet Take 1 tablet (75 mg total) by mouth daily with breakfast.  . diphenhydramine-acetaminophen (TYLENOL PM) 25-500 MG TABS tablet Take 2 tablets by mouth at bedtime.  . empagliflozin (JARDIANCE) 10 MG TABS tablet Take 1 tablet (10 mg total) by mouth daily before breakfast. Needs appointment for further refill.  [name] eplerenone (INSPRA) 50 MG tablet Take 1 tablet (50 mg total) by mouth daily.  [name] ezetimibe (ZETIA) 10 MG tablet Take 1 tablet (10 mg total) by mouth daily. Needs appointment for further refill.  . mirabegron ER (MYRBETRIQ) 50 MG TB24 tablet Take 50 mg by mouth daily.  . Multiple Vitamin (MULTIVITAMIN WITH MINERALS) TABS tablet Take 1 tablet by mouth daily. One A Day mens 50+  . Multiple Vitamins-Minerals (ONE-A-DAY MENS 50+ ADVANTAGE PO) Take 1 tablet by mouth daily.  [name] PARoxetine (PAXIL) 40 MG tablet Take 40 mg by mouth daily.  . rosuvastatin (CRESTOR) 40 MG tablet Take 1 tablet (40 mg total) by mouth daily.  . sacubitril-valsartan (ENTRESTO) 97-103 MG Take 1 tablet by mouth 2 (two) times daily. Needs appointment for further refill.  . sodium chloride (OCEAN) 0.65 % SOLN nasal spray Place 1 spray into both nostrils daily as needed for congestion.  [name] venlafaxine XR (EFFEXOR-XR) 75 MG 24 hr capsule Take 75 mg by mouth daily.     Allergies:   Spironolactone and Sulfa antibiotics   Social History   Socioeconomic History  . Marital status: Married    Spouse name: Not on file  . Number  of children: Not on file  . Years of education: Not on file  . Highest education level: Not on file  Occupational History  . Not on file  Tobacco Use  . Smoking status: Current Every Day Smoker    Packs/day: 1.00    Years: 48.00    Pack years: 48.00    Types: Cigars  . Smokeless tobacco: Never Used  Substance and Sexual Activity  . Alcohol use: Yes    Alcohol/week: 7.0 standard drinks    Types: 7 drink(s) per week  . Drug use: No  . Sexual activity: Not on file  Other Topics Concern  . Not on file  Social History Narrative  . Not on file   Social Determinants of Health   Financial Resource Strain: Not on file  Food Insecurity: Not on file  Transportation Needs: Not  on file  Physical Activity: Not on file  Stress: Not on file  Social Connections: Not on file     Family History: The patient's family history includes Cancer in his mother.  ROS:   Please see the history of present illness.    No claudication.  No real complaints.  Compliant with current medical regimen.  All other systems reviewed and are negative.  EKGs/Labs/Other Studies Reviewed:    The following studies were reviewed today: ECHOCARDIOGRAM 01/2021:  IMPRESSIONS  1. Left ventricular ejection fraction, by estimation, is 25 to 30%. The  left ventricle has severely decreased function. The left ventricle  demonstrates global hypokinesis. The left ventricular internal cavity size  was mildly dilated. Left ventricular  diastolic parameters are consistent with Grade I diastolic dysfunction  (impaired relaxation).  2. Right ventricular systolic function is normal. The right ventricular  size is normal.  3. The mitral valve is normal in structure. No evidence of mitral valve  regurgitation.  4. The aortic valve is tricuspid. There is mild calcification of the  aortic valve. There is mild thickening of the aortic valve. Aortic valve  regurgitation is not visualized. Mild aortic valve sclerosis is  present,  with no evidence of aortic valve  stenosis.   Comparison(s): No significant change from prior study. Prior images  reviewed side by side. There is almost incessant ventricular bigeminy  throughout the study.   LONG TERM MONITOR 02/10/2021:  Conclusion:  1. Predominantly NSR.  2. Very frequent PVCs, 20.9% of total beats.  3. 15 short NSVT runs (longest 7 beats).     EKG:  EKG we did not repeat  Recent Labs: 08/16/2020: ALT 25 01/25/2021: Hemoglobin 15.7; Platelets 180 02/07/2021: BUN 16; Creatinine, Ser 1.08; Potassium 4.1; Sodium 138  Recent Lipid Panel    Component Value Date/Time   CHOL 114 01/25/2021 1210   CHOL 115 02/19/2020 0914   TRIG 56 01/25/2021 1210   HDL 53 01/25/2021 1210   HDL 50 02/19/2020 0914   CHOLHDL 2.2 01/25/2021 1210   VLDL 11 01/25/2021 1210   LDLCALC 50 01/25/2021 1210   LDLCALC 51 02/19/2020 0914    Physical Exam:    VS:  BP 104/60 (BP Location: Left Arm, Patient Position: Sitting, Cuff Size: Normal)   Pulse (!) 50   Ht 5\' 10"  (1.778 m)   Wt 139 lb (63 kg)   SpO2 98%   BMI 19.94 kg/m     Wt Readings from Last 3 Encounters:  04/03/21 139 lb (63 kg)  01/25/21 140 lb 9.6 oz (63.8 kg)  10/11/20 143 lb 3.2 oz (65 kg)     GEN: Slender and almost cachectic in appearance.. No acute distress HEENT: Normal NECK: No JVD. LYMPHATICS: No lymphadenopathy CARDIAC: No murmur. RRR no gallop, or edema. VASCULAR:  Normal Pulses. No bruits. RESPIRATORY:  Clear to auscultation without rales, wheezing or rhonchi  ABDOMEN: Soft, non-tender, non-distended, No pulsatile mass, MUSCULOSKELETAL: No deformity  SKIN: Warm and dry NEUROLOGIC:  Alert and oriented x 3 PSYCHIATRIC:  Normal affect   ASSESSMENT:    1. Chronic combined systolic and diastolic heart failure ([place])   2. PVC's (premature ventricular contractions)   3. Coronary artery disease involving native coronary artery of native heart without angina pectoris   4. Mixed hyperlipidemia    5. Status post insertion of iliac artery stent   6. Essential hypertension   7. Tobacco use   8. On amiodarone therapy    PLAN:    In  order of problems listed above:  1. Continue current regimen which is for pillar and includes carvedilol, Jardiance, eplerenone, and Entresto. 2. On amiodarone for PVC suppression.  We will leave timing of repeat monitor to Dr. Aundra Herrera. 3. I strongly encouraged the patient to discontinue smoking. 4. Continue high intensity statin therapy. 5. No claudication.  Continue secondary risk prevention. 6. Target 130/80 mmHg.  Pressure is well controlled using current heart failure regimen. 7. Conversation concerning smoking cessation but he was unable to commit to discontinuation.  He will likely not stop smoking. 8. TSH and liver panel in 6 months.  Overall education and awareness concerning primary/secondary risk prevention was discussed in detail: LDL less than 70, hemoglobin A1c less than 7, blood pressure target less than 130/80 mmHg, >150 minutes of moderate aerobic activity per week, avoidance of smoking, weight control (via diet and exercise), and continued surveillance/management of/for obstructive sleep apnea.   Guideline directed therapy for left ventricular systolic dysfunction: Angiotensin receptor-neprilysin inhibitor (ARNI)-Entresto; beta-blocker therapy - carvedilol, metoprolol succinate, or bisoprolol; mineralocorticoid receptor antagonist (MRA) therapy -spironolactone or eplerenone.  SGLT-2 agents -  Dapagliflozin Wilder [place]) or Empagliflozin (Jardiance).These therapies have been shown to improve clinical outcomes including reduction of rehospitalization, survival, and acute heart failure.    Medication Adjustments/Labs and Tests Ordered: Current medicines are reviewed at length with the patient today.  Concerns regarding medicines are outlined above.  No orders of the defined types were placed in this encounter.  No orders of the defined types  were placed in this encounter.   Patient Instructions  Medication Instructions:  Your physician recommends that you continue on your current medications as directed. Please refer to the Current Medication list given to you today.  *If you need a refill on your cardiac medications before your next appointment, please call your pharmacy*   Lab Work: None If you have labs (blood work) drawn today and your tests are completely normal, you will receive your results only by: [name] MyChart Message (if you have MyChart) OR . A paper copy in the mail If you have any lab test that is abnormal or we need to change your treatment, we will call you to review the results.   Testing/Procedures: None   Follow-Up: At [facility], you and your health needs are our priority.  As part of our continuing mission to provide you with exceptional heart care, we have created designated Provider Care Teams.  These Care Teams include your primary Cardiologist (physician) and Advanced Practice Providers (APPs -  Physician Assistants and Nurse Practitioners) who all work together to provide you with the care you need, when you need it.  We recommend signing up for the patient portal called "MyChart".  Sign up information is provided on this After Visit Summary.  MyChart is used to connect with patients for Virtual Visits (Telemedicine).  Patients are able to view lab/test results, encounter notes, upcoming appointments, etc.  Non-urgent messages can be sent to your provider as well.   To learn more about what you can do with MyChart, go to [email or web address].    Your next appointment:   9 month(s)  The format for your next appointment:   In Person  Provider:   You may see Nicholas Grooms, MD or one of the following Advanced Practice Providers on your designated Care Team:    Kathyrn Drown, NP    Other Instructions      Signed, Nicholas Grooms, MD  04/03/2021 10:56 AM  [place]  [facility]

## 2021-04-03 ENCOUNTER — Encounter: Payer: Self-pay | Admitting: Interventional Cardiology

## 2021-04-03 ENCOUNTER — Other Ambulatory Visit: Payer: Self-pay

## 2021-04-03 ENCOUNTER — Ambulatory Visit (INDEPENDENT_AMBULATORY_CARE_PROVIDER_SITE_OTHER): Payer: Medicare Other | Admitting: Interventional Cardiology

## 2021-04-03 VITALS — BP 104/60 | HR 50 | Ht 70.0 in | Wt 139.0 lb

## 2021-04-03 DIAGNOSIS — I5042 Chronic combined systolic (congestive) and diastolic (congestive) heart failure: Secondary | ICD-10-CM | POA: Diagnosis not present

## 2021-04-03 DIAGNOSIS — I10 Essential (primary) hypertension: Secondary | ICD-10-CM

## 2021-04-03 DIAGNOSIS — Z79899 Other long term (current) drug therapy: Secondary | ICD-10-CM

## 2021-04-03 DIAGNOSIS — Z720 Tobacco use: Secondary | ICD-10-CM | POA: Diagnosis not present

## 2021-04-03 DIAGNOSIS — I493 Ventricular premature depolarization: Secondary | ICD-10-CM | POA: Diagnosis not present

## 2021-04-03 DIAGNOSIS — I2510 Atherosclerotic heart disease of native coronary artery without angina pectoris: Secondary | ICD-10-CM | POA: Diagnosis not present

## 2021-04-03 DIAGNOSIS — Z95828 Presence of other vascular implants and grafts: Secondary | ICD-10-CM | POA: Diagnosis not present

## 2021-04-03 DIAGNOSIS — E782 Mixed hyperlipidemia: Secondary | ICD-10-CM

## 2021-04-03 NOTE — Patient Instructions (Signed)
Medication Instructions:  Your physician recommends that you continue on your current medications as directed. Please refer to the Current Medication list given to you today.  *If you need a refill on your cardiac medications before your next appointment, please call your pharmacy*   Lab Work: None If you have labs (blood work) drawn today and your tests are completely normal, you will receive your results only by: . MyChart Message (if you have MyChart) OR . A paper copy in the mail If you have any lab test that is abnormal or we need to change your treatment, we will call you to review the results.   Testing/Procedures: None   Follow-Up: At [facility], you and your health needs are our priority.  As part of our continuing mission to provide you with exceptional heart care, we have created designated Provider Care Teams.  These Care Teams include your primary Cardiologist (physician) and Advanced Practice Providers (APPs -  Physician Assistants and Nurse Practitioners) who all work together to provide you with the care you need, when you need it.  We recommend signing up for the patient portal called "MyChart".  Sign up information is provided on this After Visit Summary.  MyChart is used to connect with patients for Virtual Visits (Telemedicine).  Patients are able to view lab/test results, encounter notes, upcoming appointments, etc.  Non-urgent messages can be sent to your provider as well.   To learn more about what you can do with MyChart, go to [email or web address].    Your next appointment:   9 month(s)  The format for your next appointment:   In Person  Provider:   You may see Henry W Smith III, MD or one of the following Advanced Practice Providers on your designated Care Team:    Jill McDaniel, NP    Other Instructions   

## 2021-04-24 DIAGNOSIS — D1801 Hemangioma of skin and subcutaneous tissue: Secondary | ICD-10-CM | POA: Diagnosis not present

## 2021-04-24 DIAGNOSIS — C44329 Squamous cell carcinoma of skin of other parts of face: Secondary | ICD-10-CM | POA: Diagnosis not present

## 2021-04-24 DIAGNOSIS — D692 Other nonthrombocytopenic purpura: Secondary | ICD-10-CM | POA: Diagnosis not present

## 2021-04-24 DIAGNOSIS — Z85828 Personal history of other malignant neoplasm of skin: Secondary | ICD-10-CM | POA: Diagnosis not present

## 2021-04-24 DIAGNOSIS — L570 Actinic keratosis: Secondary | ICD-10-CM | POA: Diagnosis not present

## 2021-04-24 DIAGNOSIS — C4442 Squamous cell carcinoma of skin of scalp and neck: Secondary | ICD-10-CM | POA: Diagnosis not present

## 2021-04-24 DIAGNOSIS — L565 Disseminated superficial actinic porokeratosis (DSAP): Secondary | ICD-10-CM | POA: Diagnosis not present

## 2021-04-24 DIAGNOSIS — D485 Neoplasm of uncertain behavior of skin: Secondary | ICD-10-CM | POA: Diagnosis not present

## 2021-04-25 ENCOUNTER — Ambulatory Visit (INDEPENDENT_AMBULATORY_CARE_PROVIDER_SITE_OTHER): Payer: Medicare Other | Admitting: Cardiovascular Disease

## 2021-04-25 ENCOUNTER — Encounter: Payer: Self-pay | Admitting: Cardiovascular Disease

## 2021-04-25 ENCOUNTER — Other Ambulatory Visit: Payer: Self-pay

## 2021-04-25 VITALS — BP 110/64 | HR 58 | Resp 18 | Ht 69.0 in | Wt 142.4 lb

## 2021-04-25 DIAGNOSIS — I739 Peripheral vascular disease, unspecified: Secondary | ICD-10-CM | POA: Diagnosis not present

## 2021-04-25 DIAGNOSIS — E785 Hyperlipidemia, unspecified: Secondary | ICD-10-CM

## 2021-04-25 DIAGNOSIS — I2510 Atherosclerotic heart disease of native coronary artery without angina pectoris: Secondary | ICD-10-CM | POA: Diagnosis not present

## 2021-04-25 DIAGNOSIS — I5022 Chronic systolic (congestive) heart failure: Secondary | ICD-10-CM | POA: Diagnosis not present

## 2021-04-25 DIAGNOSIS — Z720 Tobacco use: Secondary | ICD-10-CM | POA: Diagnosis not present

## 2021-04-25 DIAGNOSIS — I10 Essential (primary) hypertension: Secondary | ICD-10-CM | POA: Diagnosis not present

## 2021-04-25 NOTE — Patient Instructions (Signed)

## 2021-04-25 NOTE — Progress Notes (Signed)
Cardiology Office Note   Date:  04/25/2021   ID:  Nicholas Herrera, DOB Dec 14, 1940, MRN [id number]  PCP:  Nicholas Jordan, MD  Cardiologist:  Dr. Tamala Herrera  No chief complaint on file.     History of Present Illness: Nicholas Herrera is a 80 y.o. male who is here today for a follow-up visit regarding peripheral arterial disease. He has known history of coronary artery disease, chronic systolic/diastolic heart failure, tobacco use and hypertension.  He is on amiodarone therapy for frequent PVCs. He has extensive peripheral arterial disease.  He has known occluded stent in the right proximal popliteal artery.  He is status post left SFA atherectomy and drug-coated balloon angioplasty in 2019, left common iliac and external iliac artery drug-coated balloon angioplasty in 2020.  He is also status post orbital atherectomy and balloon expandable stent placement to the right common iliac artery as well as angioplasty and drug-eluting stent placement to the right external iliac artery.  He had GI bleed in October of last year due to diverticulosis. He was hospitalized in October with lower GI bleed due to diverticulosis.   He has chronic right calf claudication which happens after walking about 1 block.  He reports that symptoms overall are stable over the last 6 months.  Most recent Doppler showed moderately reduced ABI on the right with significantly elevated velocities in the iliac stents bilaterally.  He has stable exertional dyspnea and no chest pain.  He continues to smoke.  Past Medical History:  Diagnosis Date   Abdominal distention    Abdominal pain    Bronchitis    hx of   Cancer (HCC)    skin   CHF (congestive heart failure) (HCC)     (hx of EF 27%) s/p AICD 2004. Most recent LVEF > 40% , 8/11   Chronic kidney disease    has small stones, no treatment   Cough    Depression    takes paxil   Diverticulosis    hx of   Dysrhythmia    Easy bruising    Hematuria    hx of    Hernia october 2012   [facility]   HTN (hypertension)    Hyperlipidemia    takes zocor   Inguinal hernia    Left groin pain     Past Surgical History:  Procedure Laterality Date   ABDOMINAL AORTOGRAM W/LOWER EXTREMITY N/A 09/03/2018   Procedure: ABDOMINAL AORTOGRAM W/LOWER EXTREMITY;  Surgeon: Nicholas Hampshire, MD;  Location: [facility];  Service: Cardiovascular;  Laterality: N/A;   ABDOMINAL AORTOGRAM W/LOWER EXTREMITY N/A 07/29/2019   Procedure: ABDOMINAL AORTOGRAM W/LOWER EXTREMITY;  Surgeon: Nicholas Hampshire, MD;  Location: [facility];  Service: Cardiovascular;  Laterality: N/A;   ABDOMINAL AORTOGRAM W/LOWER EXTREMITY Bilateral 03/23/2020   Procedure: ABDOMINAL AORTOGRAM W/LOWER EXTREMITY;  Surgeon: Nicholas Hampshire, MD;  Location: [place] [facility];  Service: Cardiovascular;  Laterality: Bilateral;   APPENDECTOMY     CARDIAC CATHETERIZATION     CARDIAC DEFIBRILLATOR REMOVAL  2005   HERNIA REPAIR  09/11/11   repair of [place]  09/11/2011   Procedure: HERNIA REPAIR INGUINAL ADULT;  Surgeon: Nicholas Keens, DO;  Location: [facility];  Service: General;  Laterality: Left;  open left inguinal hernia with mesh   LOWER EXTREMITY ANGIOGRAM N/A 02/05/2013   Procedure: LOWER EXTREMITY ANGIOGRAM;  Surgeon: Nicholas Booze, MD;  Location: [facility];  Service: Cardiovascular;  Laterality: N/A;   PERIPHERAL VASCULAR BALLOON ANGIOPLASTY  07/29/2019   Procedure: PERIPHERAL VASCULAR BALLOON ANGIOPLASTY;  Surgeon: Nicholas Hampshire, MD;  Location: [place] [facility];  Service: Cardiovascular;;   PERIPHERAL VASCULAR INTERVENTION Left 09/03/2018   Procedure: PERIPHERAL VASCULAR INTERVENTION;  Surgeon: Nicholas Hampshire, MD;  Location: [facility];  Service: Cardiovascular;  Laterality: Left;   PERIPHERAL VASCULAR INTERVENTION Right 03/23/2020   Procedure: PERIPHERAL VASCULAR INTERVENTION;  Surgeon: Nicholas Hampshire, MD;  Location: [facility];  Service:  Cardiovascular;  Laterality: Right;   RIGHT/LEFT HEART CATH AND CORONARY ANGIOGRAPHY N/A 12/15/2019   Procedure: RIGHT/LEFT HEART CATH AND CORONARY ANGIOGRAPHY;  Surgeon: Nicholas Crome, MD;  Location: [facility];  Service: Cardiovascular;  Laterality: N/A;     Current Outpatient Medications  Medication Sig Dispense Refill   amiodarone (PACERONE) 200 MG tablet Take 1 tablet (200 mg total) by mouth 2 (two) times daily for 10 days, THEN 1 tablet (200 mg total) daily. 90 tablet 3   carvedilol (COREG) 12.5 MG tablet Take 1 tablet (12.5 mg total) by mouth 2 (two) times daily with a meal. 180 tablet 3   clopidogrel (PLAVIX) 75 MG tablet Take 1 tablet (75 mg total) by mouth daily with breakfast. 30 tablet 11   diphenhydramine-acetaminophen (TYLENOL PM) 25-500 MG TABS tablet Take 2 tablets by mouth at bedtime.     empagliflozin (JARDIANCE) 10 MG TABS tablet Take 1 tablet (10 mg total) by mouth daily before breakfast. Needs appointment for further refill. 90 tablet 0   eplerenone (INSPRA) 50 MG tablet Take 1 tablet (50 mg total) by mouth daily. 90 tablet 3   ezetimibe (ZETIA) 10 MG tablet Take 1 tablet (10 mg total) by mouth daily. Needs appointment for further refill. 90 tablet 0   mirabegron ER (MYRBETRIQ) 50 MG TB24 tablet Take 50 mg by mouth daily.     Multiple Vitamin (MULTIVITAMIN WITH MINERALS) TABS tablet Take 1 tablet by mouth daily. One A Day mens 50+     Multiple Vitamins-Minerals (ONE-A-DAY MENS 50+ ADVANTAGE PO) Take 1 tablet by mouth daily.     PARoxetine (PAXIL) 40 MG tablet Take 40 mg by mouth daily.     rosuvastatin (CRESTOR) 40 MG tablet Take 1 tablet (40 mg total) by mouth daily. 90 tablet 3   sacubitril-valsartan (ENTRESTO) 97-103 MG Take 1 tablet by mouth 2 (two) times daily. Needs appointment for further refill. 120 tablet 0   sodium chloride (OCEAN) 0.65 % SOLN nasal spray Place 1 spray into both nostrils daily as needed for congestion.     venlafaxine XR (EFFEXOR-XR) 75 MG  24 hr capsule Take 75 mg by mouth daily.     No current facility-administered medications for this visit.    Allergies:   Spironolactone and Sulfa antibiotics    Social History:  The patient  reports that he has been smoking cigars and cigarettes. He has a 48.00 pack-year smoking history. He has never used smokeless tobacco. He reports current alcohol use of about 7.0 standard drinks of alcohol per week. He reports that he does not use drugs.   Family History:  The patient's family history includes Cancer in his mother.    ROS:  Please see the history of present illness.   Otherwise, review of systems are positive for none.   All other systems are reviewed and negative.    PHYSICAL EXAM: VS:  BP 110/64 (BP Location: Left Arm, Patient Position: Sitting, Cuff Size:  Normal)   Pulse (!) 58   Resp 18   Ht 5\' 9"  (1.753 m)   Wt 142 lb 6.4 oz (64.6 kg)   SpO2 100%   BMI 21.03 kg/m  , BMI Body mass index is 21.03 kg/m. GEN: Well nourished, well developed, in no acute distress  HEENT: normal  Neck: no JVD, carotid bruits, or masses Cardiac: RRR; no murmurs, rubs, or gallops,no edema  Respiratory:  clear to auscultation bilaterally, normal work of breathing GI: soft, nontender, nondistended, + BS MS: no deformity or atrophy  Skin: warm and dry, no rash Neuro:  Strength and sensation are intact Psych: euthymic mood, full affect Vascular: Femoral pulse: +1 bilaterally.  Distal pulses are not palpable.  EKG:  EKG is not ordered today.   Recent Labs: 08/16/2020: ALT 25 01/25/2021: Hemoglobin 15.7; Platelets 180 02/07/2021: BUN 16; Creatinine, Ser 1.08; Potassium 4.1; Sodium 138    Lipid Panel    Component Value Date/Time   CHOL 114 01/25/2021 1210   CHOL 115 02/19/2020 0914   TRIG 56 01/25/2021 1210   HDL 53 01/25/2021 1210   HDL 50 02/19/2020 0914   CHOLHDL 2.2 01/25/2021 1210   VLDL 11 01/25/2021 1210   LDLCALC 50 01/25/2021 1210   LDLCALC 51 02/19/2020 0914      Wt  Readings from Last 3 Encounters:  04/25/21 142 lb 6.4 oz (64.6 kg)  04/03/21 139 lb (63 kg)  01/25/21 140 lb 9.6 oz (63.8 kg)       No flowsheet data found.    ASSESSMENT AND PLAN:   1.  Peripheral arterial disease: Status post bilateral endovascular intervention on iliac arteries with known chronically occluded right popliteal artery stent.  Most recent Doppler studies showed significant restenosis in the iliac stents.  We requested repeat Doppler studies to be done in early July.  If there is significant progression of restenosis, we we will need to consider repeat angiography and drug-coated balloon angioplasty to prevent occlusion.  Continue Plavix for now without aspirin given extensive bruising.    2. Tobacco use: I discussed with him the correlation between restenosis and tobacco use that he has been smoking for more than 15 years and reports inability to quit.  3. Hyperlipidemia: I reviewed most recent lipid profile which showed an LDL of 50 and triglyceride of 56.  Continue treatment with rosuvastatin and Zetia.   4. Coronary artery disease: No anginal symptoms.    5. Chronic systolic heart failure: He is followed by the heart failure clinic .  He is currently on optimal medical therapy.  6.  Essential hypertension: Blood pressure is reasonably controlled on current medications    Disposition:   FU with me in 6 months  Signed,  Kathlyn Sacramento, MD  04/25/2021 10:04 AM    [place]

## 2021-05-02 ENCOUNTER — Other Ambulatory Visit (HOSPITAL_COMMUNITY): Payer: Self-pay | Admitting: Cardiology

## 2021-05-02 ENCOUNTER — Other Ambulatory Visit: Payer: Self-pay

## 2021-05-02 ENCOUNTER — Encounter (HOSPITAL_COMMUNITY): Payer: Self-pay | Admitting: Cardiology

## 2021-05-02 ENCOUNTER — Ambulatory Visit (HOSPITAL_COMMUNITY)
Admission: RE | Admit: 2021-05-02 | Discharge: 2021-05-02 | Disposition: A | Discharge status: Home / Self Care | Payer: Medicare Other | Source: Ambulatory Visit | Attending: Cardiology | Admitting: Cardiology

## 2021-05-02 VITALS — BP 102/60 | HR 50 | Wt 141.4 lb

## 2021-05-02 DIAGNOSIS — I5022 Chronic systolic (congestive) heart failure: Secondary | ICD-10-CM | POA: Diagnosis not present

## 2021-05-02 DIAGNOSIS — Z95810 Presence of automatic (implantable) cardiac defibrillator: Secondary | ICD-10-CM | POA: Insufficient documentation

## 2021-05-02 DIAGNOSIS — R002 Palpitations: Secondary | ICD-10-CM

## 2021-05-02 DIAGNOSIS — F17210 Nicotine dependence, cigarettes, uncomplicated: Secondary | ICD-10-CM | POA: Insufficient documentation

## 2021-05-02 DIAGNOSIS — Z79899 Other long term (current) drug therapy: Secondary | ICD-10-CM | POA: Insufficient documentation

## 2021-05-02 DIAGNOSIS — I110 Hypertensive heart disease with heart failure: Secondary | ICD-10-CM | POA: Insufficient documentation

## 2021-05-02 DIAGNOSIS — E785 Hyperlipidemia, unspecified: Secondary | ICD-10-CM | POA: Insufficient documentation

## 2021-05-02 DIAGNOSIS — Z7902 Long term (current) use of antithrombotics/antiplatelets: Secondary | ICD-10-CM | POA: Diagnosis not present

## 2021-05-02 DIAGNOSIS — Z7982 Long term (current) use of aspirin: Secondary | ICD-10-CM | POA: Diagnosis not present

## 2021-05-02 DIAGNOSIS — I428 Other cardiomyopathies: Secondary | ICD-10-CM | POA: Diagnosis not present

## 2021-05-02 DIAGNOSIS — I739 Peripheral vascular disease, unspecified: Secondary | ICD-10-CM | POA: Insufficient documentation

## 2021-05-02 DIAGNOSIS — I493 Ventricular premature depolarization: Secondary | ICD-10-CM | POA: Diagnosis not present

## 2021-05-02 DIAGNOSIS — I5042 Chronic combined systolic (congestive) and diastolic (congestive) heart failure: Secondary | ICD-10-CM

## 2021-05-02 LAB — CBC
HCT: 45.4 % (ref 39.0–52.0)
Hemoglobin: 15.2 g/dL (ref 13.0–17.0)
MCH: 32.8 pg (ref 26.0–34.0)
MCHC: 33.5 g/dL (ref 30.0–36.0)
MCV: 97.8 fL (ref 80.0–100.0)
Platelets: 188 10*3/uL (ref 150–400)
RBC: 4.64 MIL/uL (ref 4.22–5.81)
RDW: 14 % (ref 11.5–15.5)
WBC: 9.1 10*3/uL (ref 4.0–10.5)
nRBC: 0 % (ref 0.0–0.2)

## 2021-05-02 LAB — COMPREHENSIVE METABOLIC PANEL
ALT: 40 U/L (ref 0–44)
AST: 37 U/L (ref 15–41)
Albumin: 3.7 g/dL (ref 3.5–5.0)
Alkaline Phosphatase: 83 U/L (ref 38–126)
Anion gap: 10 (ref 5–15)
BUN: 18 mg/dL (ref 8–23)
CO2: 27 mmol/L (ref 22–32)
Calcium: 9.1 mg/dL (ref 8.9–10.3)
Chloride: 99 mmol/L (ref 98–111)
Creatinine, Ser: 1.13 mg/dL (ref 0.61–1.24)
GFR, Estimated: >60 mL/min (ref >60)
Glucose, Bld: 102 mg/dL — ABNORMAL HIGH (ref 70–99)
Potassium: 4.4 mmol/L (ref 3.5–5.1)
Sodium: 136 mmol/L (ref 135–145)
Total Bilirubin: 0.6 mg/dL (ref 0.3–1.2)
Total Protein: 6.3 g/dL — ABNORMAL LOW (ref 6.5–8.1)

## 2021-05-02 LAB — TSH: TSH: 0.492 u[IU]/mL (ref 0.350–4.500)

## 2021-05-02 NOTE — Progress Notes (Signed)
PCP: Jonathon Jordan, MD Cardiology: Dr. Tamala Julian HF Cardiology: Dr. Aundra Dubin  80 y.o. with PAD, smoking, and a long history of cardiomyopathy was referred by Dr. Tamala Julian for evaluation of CHF/cardiomyopathy.  Patient was diagnosed with a cardiomyopathy prior to 2004 while he was living in [place].  He had an ICD placed in 2004.  Per notes, EF was < 30% during this time.  He thinks he may have had a heart catheterization but does not remember the details.  The ICD subsequently malfunctioned and was turned off in 2009.  It is still implanted and has 2 nonfunctioning leads.  Echo in 2011 after moving the [facility] showed improved EF at 40-45%.  He did not have an echo over the next 9 years.  However, Echo done in 7/20 showed EF down to 20-25% with a moderately dilated LV.  He has been subsequently started on HF meds.  He saw Dr. Lovena Le to decide about explantation of old ICD/implantation of new ICD.  He was thought to be not a candidate for a new intravascular ICD.  Subcutaneous ICD would be a potential option but utility would like be limited with nonischemic cardiomyopathy at age 72.   Patient also has an extensive PAD history.  In 9/20 he had PTCA to left external and common iliac arteries.    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  He was well-compensated.   Peripheral angiography in 5/21 with stent to the right external iliac artery.   He had presumed diverticular bleeding in 10/21.   Holter in 3/22 with very frequent PVCs, 21% total beats.  Amiodarone was started.    Echo in 4/22 showed EF 25-30%, mild LV enlargement, normal RV.    He returns today for followup of CHF.  Still smoking.  He has right calf claudication after walking about a block or so.  He has no significant exertional dyspnea.  No chest pain.  No orthopnea/PND.  No lightheadedness.   Labs (2/20): LDL 93 Labs (11/20): K 4, creatinine 0.99  Labs (1/21): LDL 89 Labs (2/21): K 4.2, creatinine  0.97 Labs (4/21): LDL 51, HDL 50, K 3.7, creatinine 0.92 Labs (6/21): K 4.5, creatinine 0.86 Labs (10/21): K 4.3, creatinine 1.3, hgb 14.4 Labs (3/22): LDL 50 Labs (4/22): K 4.1, creatinine 1.08  PMH:  1. HTN 2. Hyperlipidemia 3. Active smoker 4. PAD: Right popliteal stent in 2014 with subsequent occlusion.   - Orbital atherectomy + drug coated balloon angioplasty of left SFA in 2019.   - In 9/20, had PTCA left external and common iliac arteries.  - Peripheral angiogram in 5/21 with stent to right external iliac artery.  - 1/22 peripheral arterial dopplers with significant bilateral iliac disease.  5. CKD: Stage 3.  6. Cardiomyopathy: Patient had an ICD placed in [place] in 2004, so presumably had a pre-dating cardiomyopathy.  He thinks that he had a heart cath in [place] but is not sure.  EF <30% when he moved to [employer].  - Echo (2011) with EF improved to 40-45%.  - Echo (7/20): EF 20-25%, moderate LV dilation, normal RV size and systolic function.  - Patient has 2 nonfunctioning ICD leads (ICD is not functional, turned off in 2009).  - LHC/RHC (2/21): Nonobstructive CAD.  Mean RA 3, PA 25/5, mean PCWP 7, CI 2.4.  - Echo (4/22): EF 25-30%, mild LV enlargement, normal RV. 7. Type 2 diabetes 8. Diverticular bleeding in 10/21.  9. PVCs: Zio patch 3/22 showed 21% PVCs  with 15 short NSVT runs.   SH: Retired 15 years ago from [employer].  Married with 3 children, smokes about 1/2-1 ppd still, has had about 3 drinks/day for 2-3 years.   FH: Mother with cancer, father with COPD.  No heart disease that he knows of.   ROS: All systems reviewed and negative except as per HPI.   Current Outpatient Medications  Medication Sig Dispense Refill   amiodarone (PACERONE) 200 MG tablet Take 200 mg by mouth daily.     carvedilol (COREG) 12.5 MG tablet Take 1 tablet (12.5 mg total) by mouth 2 (two) times daily with a meal. 180 tablet 3   clopidogrel (PLAVIX) 75 MG tablet Take 1 tablet (75  mg total) by mouth daily with breakfast. 30 tablet 11   diphenhydramine-acetaminophen (TYLENOL PM) 25-500 MG TABS tablet Take 2 tablets by mouth at bedtime.     empagliflozin (JARDIANCE) 10 MG TABS tablet Take 1 tablet (10 mg total) by mouth daily before breakfast. Needs appointment for further refill. 90 tablet 0   eplerenone (INSPRA) 50 MG tablet Take 1 tablet (50 mg total) by mouth daily. 90 tablet 3   ezetimibe (ZETIA) 10 MG tablet Take 1 tablet (10 mg total) by mouth daily. Needs appointment for further refill. 90 tablet 0   mirabegron ER (MYRBETRIQ) 50 MG TB24 tablet Take 50 mg by mouth daily.     Multiple Vitamins-Minerals (ONE-A-DAY MENS 50+ ADVANTAGE PO) Take 1 tablet by mouth daily.     PARoxetine (PAXIL) 40 MG tablet Take 40 mg by mouth daily.     rosuvastatin (CRESTOR) 40 MG tablet Take 1 tablet (40 mg total) by mouth daily. 90 tablet 3   sacubitril-valsartan (ENTRESTO) 97-103 MG Take 1 tablet by mouth 2 (two) times daily. Needs appointment for further refill. 120 tablet 0   sodium chloride (OCEAN) 0.65 % SOLN nasal spray Place 1 spray into both nostrils daily as needed for congestion.     venlafaxine XR (EFFEXOR-XR) 75 MG 24 hr capsule Take 75 mg by mouth daily.     No current facility-administered medications for this encounter.   BP 102/60   Pulse (!) 50   Wt 64.1 kg (141 lb 6.4 oz)   SpO2 98%   BMI 20.88 kg/m  General: NAD Neck: No JVD, no thyromegaly or thyroid nodule.  Lungs: Clear to auscultation bilaterally with normal respiratory effort. CV: Nondisplaced PMI.  Heart regular S1/S2, no S3/S4, no murmur.  No peripheral edema.  No carotid bruit.  Unable to palpate pedal pulses on right.  Abdomen: Soft, nontender, no hepatosplenomegaly, no distention.  Skin: Intact without lesions or rashes.  Neurologic: Alert and oriented x 3.  Psych: Normal affect. Extremities: No clubbing or cyanosis.  HEENT: Normal.   Assessment/Plan: 1. Chronic systolic CHF:  Patient has had a  known nonischemic cardiomyopathy since prior to 2004.  EF had improved to 40-45% in 2011 but had dropped significantly on echo in 7/20 with EF 20-25%. He has a nonfunctioning ICD.  Cause of the cardiomyopathy is uncertain.  Coronary angiography in 2/21 showed no significant coronary disease. He has no family history of cardiomyopathy.  RHC in 2/21 showed normal filling pressures and normal cardiac output.  Echo in 4/22 with EF still low at 25-30%.  PVCs contribute to his cardiomyopathy, Zio patch in 3/22 with 21% PVCs.  NYHA class I-II symptoms, he is not volume overloaded on exam.  Weight stable.  - Continue Coreg 12.5 mg bid, HR too low  to increase.    - Continue Entresto 97/103 bid.  BMET today.  - Continue eplerenone 50 mg daily.  - Continue Jardiance 10 mg daily.  - He does not appear to need Lasix.  - Patient is not a candidate for another intravascular ICD.  Limited utility to implanting subcutaneous ICD in the setting of a nonischemic cardiomyopathy.   2. PAD: Right claudication and evidence on last peripheral arterial dopplers of worsening right leg disease.     - Stay active.  - Continue ASA 81 and Crestor 40 mg daily + Zetia 10 mg daily.  - Needs to quit smoking => he has been unable to do this.  - Has repeat peripheral arterial dopplers in 7/22 then followup with Dr. Fletcher Anon to decide on peripheral angiography.  3. HTN: BP stable.  4. Hyperlipidemia: Good LDL in 3/22.  5. PVCs: Frequent in past, may contribute to cardiomyopathy.  - Repeat 7 day Zio patch to re-quantify PVCs now that he is on amiodarone.  - Continue amiodarone 200 mg daily, will check LFTs, TSH and will need regular eye exam.   Followup in 3 months.   Loralie Champagne 05/02/2021

## 2021-05-02 NOTE — Patient Instructions (Signed)
Labs done today, your results will be available in MyChart, we will contact you for abnormal readings.  Your provider has recommended that  you wear a Zio Patch for 7 days.  This monitor will record your heart rhythm for our review.  IF you have any symptoms while wearing the monitor please press the button.  If you have any issues with the patch or you notice a red or orange light on it please call the company at [phone].  Once you remove the patch please mail it back to the company as soon as possible so we can get the results.  Your physician recommends that you schedule a follow-up appointment in: 3 months  If you have any questions or concerns before your next appointment please send [place] a message through [place] or call our office at [phone].    TO LEAVE A MESSAGE FOR THE NURSE SELECT OPTION 2, PLEASE LEAVE A MESSAGE INCLUDING: YOUR NAME DATE OF BIRTH CALL BACK NUMBER REASON FOR CALL**this is important as we prioritize the call backs  YOU WILL RECEIVE A CALL BACK THE SAME DAY AS LONG AS YOU CALL BEFORE 4:00 PM  At the [facility], you and your health needs are our priority. As part of our continuing mission to provide you with exceptional heart care, we have created designated Provider Care Teams. These Care Teams include your primary Cardiologist (physician) and Advanced Practice Providers (APPs- Physician Assistants and Nurse Practitioners) who all work together to provide you with the care you need, when you need it.   You may see any of the following providers on your designated Care Team at your next follow up: Dr Glori Bickers Dr Loralie Champagne Dr Patrice Paradise, NP Lyda Jester, [place] Ginnie Smart Audry Riles, PharmD   Please be sure to bring in all your medications bottles to every appointment.

## 2021-05-02 NOTE — Progress Notes (Signed)
Your provider has recommended that  you wear a Zio Patch for 7 days.  This monitor will record your heart rhythm for our review.  IF you have any symptoms while wearing the monitor please press the button.  If you have any issues with the patch or you notice a red or orange light on it please call the company at [phone].  Once you remove the patch please mail it back to the company as soon as possible so we can get the results.

## 2021-05-03 ENCOUNTER — Ambulatory Visit (HOSPITAL_COMMUNITY)
Admission: RE | Admit: 2021-05-03 | Discharge: 2021-05-03 | Disposition: A | Discharge status: Home / Self Care | Payer: Medicare Other | Source: Ambulatory Visit | Attending: Internal Medicine | Admitting: Internal Medicine

## 2021-05-03 ENCOUNTER — Other Ambulatory Visit (HOSPITAL_COMMUNITY): Payer: Self-pay | Admitting: Cardiovascular Disease

## 2021-05-03 ENCOUNTER — Ambulatory Visit (HOSPITAL_BASED_OUTPATIENT_CLINIC_OR_DEPARTMENT_OTHER)
Admission: RE | Admit: 2021-05-03 | Discharge: 2021-05-03 | Disposition: A | Discharge status: Home / Self Care | Payer: Medicare Other | Source: Ambulatory Visit | Attending: Cardiovascular Disease | Admitting: Cardiovascular Disease

## 2021-05-03 DIAGNOSIS — Z79899 Other long term (current) drug therapy: Secondary | ICD-10-CM | POA: Diagnosis not present

## 2021-05-03 DIAGNOSIS — Z95828 Presence of other vascular implants and grafts: Secondary | ICD-10-CM

## 2021-05-03 DIAGNOSIS — I739 Peripheral vascular disease, unspecified: Secondary | ICD-10-CM

## 2021-05-03 DIAGNOSIS — E785 Hyperlipidemia, unspecified: Secondary | ICD-10-CM | POA: Diagnosis not present

## 2021-05-03 DIAGNOSIS — I110 Hypertensive heart disease with heart failure: Secondary | ICD-10-CM | POA: Diagnosis not present

## 2021-05-03 DIAGNOSIS — I5022 Chronic systolic (congestive) heart failure: Secondary | ICD-10-CM | POA: Diagnosis not present

## 2021-05-03 DIAGNOSIS — I428 Other cardiomyopathies: Secondary | ICD-10-CM | POA: Diagnosis not present

## 2021-05-15 DIAGNOSIS — R002 Palpitations: Secondary | ICD-10-CM | POA: Diagnosis not present

## 2021-05-16 ENCOUNTER — Other Ambulatory Visit (HOSPITAL_COMMUNITY): Payer: Self-pay | Admitting: Cardiology

## 2021-05-22 ENCOUNTER — Encounter (HOSPITAL_COMMUNITY): Payer: Self-pay

## 2021-07-04 ENCOUNTER — Other Ambulatory Visit (HOSPITAL_COMMUNITY): Payer: Self-pay | Admitting: Cardiology

## 2021-07-04 ENCOUNTER — Telehealth (HOSPITAL_COMMUNITY): Payer: Self-pay

## 2021-07-19 DIAGNOSIS — L570 Actinic keratosis: Secondary | ICD-10-CM | POA: Diagnosis not present

## 2021-07-19 DIAGNOSIS — L821 Other seborrheic keratosis: Secondary | ICD-10-CM | POA: Diagnosis not present

## 2021-07-19 DIAGNOSIS — Z85828 Personal history of other malignant neoplasm of skin: Secondary | ICD-10-CM | POA: Diagnosis not present

## 2021-07-19 DIAGNOSIS — D692 Other nonthrombocytopenic purpura: Secondary | ICD-10-CM | POA: Diagnosis not present

## 2021-07-20 DIAGNOSIS — R350 Frequency of micturition: Secondary | ICD-10-CM | POA: Diagnosis not present

## 2021-07-20 DIAGNOSIS — R3915 Urgency of urination: Secondary | ICD-10-CM | POA: Diagnosis not present

## 2021-08-02 ENCOUNTER — Encounter (HOSPITAL_COMMUNITY): Payer: Self-pay | Admitting: Cardiology

## 2021-08-02 ENCOUNTER — Ambulatory Visit (HOSPITAL_COMMUNITY)
Admission: RE | Admit: 2021-08-02 | Discharge: 2021-08-02 | Disposition: A | Discharge status: Home / Self Care | Payer: Medicare Other | Source: Ambulatory Visit | Attending: Cardiology | Admitting: Cardiology

## 2021-08-02 ENCOUNTER — Other Ambulatory Visit: Payer: Self-pay

## 2021-08-02 VITALS — BP 140/88 | HR 59 | Wt 143.4 lb

## 2021-08-02 DIAGNOSIS — I5042 Chronic combined systolic (congestive) and diastolic (congestive) heart failure: Secondary | ICD-10-CM

## 2021-08-02 DIAGNOSIS — I493 Ventricular premature depolarization: Secondary | ICD-10-CM | POA: Insufficient documentation

## 2021-08-02 DIAGNOSIS — I428 Other cardiomyopathies: Secondary | ICD-10-CM | POA: Diagnosis not present

## 2021-08-02 DIAGNOSIS — Z79899 Other long term (current) drug therapy: Secondary | ICD-10-CM | POA: Diagnosis not present

## 2021-08-02 DIAGNOSIS — Z7901 Long term (current) use of anticoagulants: Secondary | ICD-10-CM | POA: Diagnosis not present

## 2021-08-02 DIAGNOSIS — I739 Peripheral vascular disease, unspecified: Secondary | ICD-10-CM | POA: Diagnosis not present

## 2021-08-02 DIAGNOSIS — I130 Hypertensive heart and chronic kidney disease with heart failure and stage 1 through stage 4 chronic kidney disease, or unspecified chronic kidney disease: Secondary | ICD-10-CM | POA: Diagnosis not present

## 2021-08-02 DIAGNOSIS — Z7984 Long term (current) use of oral hypoglycemic drugs: Secondary | ICD-10-CM | POA: Insufficient documentation

## 2021-08-02 DIAGNOSIS — E1122 Type 2 diabetes mellitus with diabetic chronic kidney disease: Secondary | ICD-10-CM | POA: Diagnosis not present

## 2021-08-02 DIAGNOSIS — F17210 Nicotine dependence, cigarettes, uncomplicated: Secondary | ICD-10-CM | POA: Insufficient documentation

## 2021-08-02 DIAGNOSIS — I2510 Atherosclerotic heart disease of native coronary artery without angina pectoris: Secondary | ICD-10-CM | POA: Diagnosis not present

## 2021-08-02 DIAGNOSIS — Z7902 Long term (current) use of antithrombotics/antiplatelets: Secondary | ICD-10-CM | POA: Insufficient documentation

## 2021-08-02 DIAGNOSIS — Z95810 Presence of automatic (implantable) cardiac defibrillator: Secondary | ICD-10-CM | POA: Diagnosis not present

## 2021-08-02 DIAGNOSIS — E785 Hyperlipidemia, unspecified: Secondary | ICD-10-CM | POA: Diagnosis not present

## 2021-08-02 DIAGNOSIS — N183 Chronic kidney disease, stage 3 unspecified: Secondary | ICD-10-CM | POA: Diagnosis not present

## 2021-08-02 LAB — COMPREHENSIVE METABOLIC PANEL
ALT: 48 U/L — ABNORMAL HIGH (ref 0–44)
AST: 44 U/L — ABNORMAL HIGH (ref 15–41)
Albumin: 3.6 g/dL (ref 3.5–5.0)
Alkaline Phosphatase: 83 U/L (ref 38–126)
Anion gap: 8 (ref 5–15)
BUN: 19 mg/dL (ref 8–23)
CO2: 28 mmol/L (ref 22–32)
Calcium: 8.8 mg/dL — ABNORMAL LOW (ref 8.9–10.3)
Chloride: 99 mmol/L (ref 98–111)
Creatinine, Ser: 1.19 mg/dL (ref 0.61–1.24)
GFR, Estimated: >60 mL/min (ref >60)
Glucose, Bld: 109 mg/dL — ABNORMAL HIGH (ref 70–99)
Potassium: 4.3 mmol/L (ref 3.5–5.1)
Sodium: 135 mmol/L (ref 135–145)
Total Bilirubin: 0.7 mg/dL (ref 0.3–1.2)
Total Protein: 6.3 g/dL — ABNORMAL LOW (ref 6.5–8.1)

## 2021-08-02 LAB — TSH: TSH: 0.527 u[IU]/mL (ref 0.350–4.500)

## 2021-08-02 MED ORDER — CARVEDILOL 12.5 MG PO TABS: 18.7500 mg | ORAL_TABLET | Freq: Two times a day (BID) | ORAL | 3 refills | Status: DC

## 2021-08-02 MED ORDER — CARVEDILOL 25 MG PO TABS
25.0000 mg | ORAL_TABLET | Freq: Two times a day (BID) | ORAL | 3 refills | Status: DC
Start: 2021-08-02 — End: 2021-08-02

## 2021-08-02 MED ORDER — AMIODARONE HCL 200 MG PO TABS: 100.0000 mg | ORAL_TABLET | Freq: Every day | ORAL | 3 refills | Status: DC

## 2021-08-02 NOTE — Patient Instructions (Addendum)
EKG done today.  Labs done today. We will contact you only if your labs are abnormal.  DECREASE Amiodarone to 100mg  (1/2 tablet) by mouth daily.   INCREASE Carvedilol to 18.75mg  (1 & 1/2 tablet) by mouth 2 times daily.   No other medication changes were made. Please continue all current medications as prescribed.  Your physician recommends that you schedule a follow-up appointment in: 4 months. Please contact our office in January to schedule a February 2023 appointment.    If you have any questions or concerns before your next appointment please send [place] a message through [place] or call our office at [phone].    TO LEAVE A MESSAGE FOR THE NURSE SELECT OPTION 2, PLEASE LEAVE A MESSAGE INCLUDING: YOUR NAME DATE OF BIRTH CALL BACK NUMBER REASON FOR CALL**this is important as we prioritize the call backs  YOU WILL RECEIVE A CALL BACK THE SAME DAY AS LONG AS YOU CALL BEFORE 4:00 PM   Do the following things EVERYDAY: Weigh yourself in the morning before breakfast. Write it down and keep it in a log. Take your medicines as prescribed Eat low salt foods--Limit salt (sodium) to 2000 mg per day.  Stay as active as you can everyday Limit all fluids for the day to less than 2 liters   At the [facility], you and your health needs are our priority. As part of our continuing mission to provide you with exceptional heart care, we have created designated Provider Care Teams. These Care Teams include your primary Cardiologist (physician) and Advanced Practice Providers (APPs- Physician Assistants and Nurse Practitioners) who all work together to provide you with the care you need, when you need it.   You may see any of the following providers on your designated Care Team at your next follow up: Dr Glori Bickers Dr Haynes Kerns, NP Lyda Jester, [place] Audry Riles, PharmD   Please be sure to bring in all your medications bottles to every appointment.

## 2021-08-02 NOTE — Progress Notes (Signed)
PCP: Jonathon Jordan, MD Cardiology: Dr. Tamala Julian HF Cardiology: Dr. Aundra Dubin  80 y.o. with PAD, smoking, and a long history of cardiomyopathy was referred by Dr. Tamala Julian for evaluation of CHF/cardiomyopathy.  Patient was diagnosed with a cardiomyopathy prior to 2004 while he was living in [place].  He had an ICD placed in 2004.  Per notes, EF was < 30% during this time.  He thinks he may have had a heart catheterization but does not remember the details.  The ICD subsequently malfunctioned and was turned off in 2009.  It is still implanted and has 2 nonfunctioning leads.  Echo in 2011 after moving the [facility] showed improved EF at 40-45%.  He did not have an echo over the next 9 years.  However, Echo done in 7/20 showed EF down to 20-25% with a moderately dilated LV.  He has been subsequently started on HF meds.  He saw Dr. Lovena Le to decide about explantation of old ICD/implantation of new ICD.  He was thought to be not a candidate for a new intravascular ICD.  Subcutaneous ICD would be a potential option but utility would like be limited with nonischemic cardiomyopathy at age 73.   Patient also has an extensive PAD history.  In 9/20 he had PTCA to left external and common iliac arteries.    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  He was well-compensated.   Peripheral angiography in 5/21 with stent to the right external iliac artery.   He had presumed diverticular bleeding in 10/21.   Holter in 3/22 with very frequent PVCs, 21% total beats.  Amiodarone was started.  Repeat Zio monitor in 7/22 showed PVCs down to 1.8%.   Echo in 4/22 showed EF 25-30%, mild LV enlargement, normal RV.    He returns today for followup of CHF.  Still smoking.  He has right calf claudication after about 10 minutes of walking, this is stable. No chest pain.  No significant exertional dyspnea, no orthopnea/PND.  No lightheadedness.  Having some trouble with balance but not falls.  Weight  stable.   ECG (personally reviewed): NSR, nonspecific T wave changes  Labs (2/20): LDL 93 Labs (11/20): K 4, creatinine 0.99  Labs (1/21): LDL 89 Labs (2/21): K 4.2, creatinine 0.97 Labs (4/21): LDL 51, HDL 50, K 3.7, creatinine 0.92 Labs (6/21): K 4.5, creatinine 0.86 Labs (10/21): K 4.3, creatinine 1.3, hgb 14.4 Labs (3/22): LDL 50 Labs (4/22): K 4.1, creatinine 1.08 Labs (7/22): K 4.4, creatinine 1.13, hgb 15.2, TSH normal, LFTs normal  PMH:  1. HTN 2. Hyperlipidemia 3. Active smoker 4. PAD: Right popliteal stent in 2014 with subsequent occlusion.   - Orbital atherectomy + drug coated balloon angioplasty of left SFA in 2019.   - In 9/20, had PTCA left external and common iliac arteries.  - Peripheral angiogram in 5/21 with stent to right external iliac artery.  - 1/22 peripheral arterial dopplers with significant bilateral iliac disease.  - 7/22 peripheral arterial dopplers with moderate stenosis right EIA stent.  5. CKD: Stage 3.  6. Cardiomyopathy: Patient had an ICD placed in [place] in 2004, so presumably had a pre-dating cardiomyopathy.  He thinks that he had a heart cath in [place] but is not sure.  EF <30% when he moved to [place].  - Echo (2011) with EF improved to 40-45%.  - Echo (7/20): EF 20-25%, moderate LV dilation, normal RV size and systolic function.  - Patient has 2 nonfunctioning ICD leads (ICD  is not functional, turned off in 2009).  - LHC/RHC (2/21): Nonobstructive CAD.  Mean RA 3, PA 25/5, mean PCWP 7, CI 2.4.  - Echo (4/22): EF 25-30%, mild LV enlargement, normal RV. 7. Type 2 diabetes 8. Diverticular bleeding in 10/21.  9. PVCs: Zio patch 3/22 showed 21% PVCs with 15 short NSVT runs.  - Zio monitor (7/22): 1.8% PVCs on amiodarone.   SH: Retired 15 years ago from [employer].  Married with 3 children, smokes about 1/2-1 ppd still, has had about 3 drinks/day for 2-3 years.   FH: Mother with cancer, father with COPD.  No heart disease that he  knows of.   ROS: All systems reviewed and negative except as per HPI.   Current Outpatient Medications  Medication Sig Dispense Refill   clopidogrel (PLAVIX) 75 MG tablet Take 1 tablet (75 mg total) by mouth daily with breakfast. 30 tablet 11   diphenhydramine-acetaminophen (TYLENOL PM) 25-500 MG TABS tablet Take 2 tablets by mouth at bedtime.     ENTRESTO 97-103 MG TAKE 1 TABLET BY MOUTH TWICE A DAY 60 tablet 11   eplerenone (INSPRA) 50 MG tablet Take 1 tablet (50 mg total) by mouth daily. 90 tablet 3   ezetimibe (ZETIA) 10 MG tablet Take 1 tablet (10 mg total) by mouth daily. Needs appointment for further refill. 90 tablet 0   JARDIANCE 10 MG TABS tablet TAKE 1 TABLET (10 MG TOTAL) BY MOUTH DAILY BEFORE BREAKFAST. 30 tablet 6   mirabegron ER (MYRBETRIQ) 50 MG TB24 tablet Take 50 mg by mouth daily.     Multiple Vitamins-Minerals (ONE-A-DAY MENS 50+ ADVANTAGE PO) Take 1 tablet by mouth daily.     PARoxetine (PAXIL) 40 MG tablet Take 40 mg by mouth daily.     rosuvastatin (CRESTOR) 40 MG tablet Take 1 tablet (40 mg total) by mouth daily. 90 tablet 3   sodium chloride (OCEAN) 0.65 % SOLN nasal spray Place 1 spray into both nostrils daily as needed for congestion.     venlafaxine XR (EFFEXOR-XR) 75 MG 24 hr capsule Take 75 mg by mouth daily.     amiodarone (PACERONE) 200 MG tablet Take 0.5 tablets (100 mg total) by mouth daily. 45 tablet 3   carvedilol (COREG) 12.5 MG tablet Take 1.5 tablets (18.75 mg total) by mouth 2 (two) times daily with a meal. 270 tablet 3   No current facility-administered medications for this encounter.   BP 140/88   Pulse (!) 59   Wt 65 kg (143 lb 6.4 oz)   SpO2 98%   BMI 21.18 kg/m  General: NAD Neck: No JVD, no thyromegaly or thyroid nodule.  Lungs: Clear to auscultation bilaterally with normal respiratory effort. CV: Nondisplaced PMI.  Heart regular S1/S2, no S3/S4, no murmur.  No peripheral edema.  No carotid bruit.  Normal pedal pulses.  Abdomen: Soft,  nontender, no hepatosplenomegaly, no distention.  Skin: Intact without lesions or rashes.  Neurologic: Alert and oriented x 3.  Psych: Normal affect. Extremities: No clubbing or cyanosis.  HEENT: Normal.    Assessment/Plan: 1. Chronic systolic CHF:  Patient has had a known nonischemic cardiomyopathy since prior to 2004.  EF had improved to 40-45% in 2011 but had dropped significantly on echo in 7/20 with EF 20-25%. He has a nonfunctioning ICD.  Cause of the cardiomyopathy is uncertain.  Coronary angiography in 2/21 showed no significant coronary disease. He has no family history of cardiomyopathy.  RHC in 2/21 showed normal filling pressures and normal  cardiac output.  Echo in 4/22 with EF still low at 25-30%.  PVCs contribute to his cardiomyopathy, Zio patch in 3/22 with 21% PVCs.  NYHA class I-II symptoms, not volume overloaded on exam with stable weight.  - Increase Coreg to 18.75 mg bid.    - Continue Entresto 97/103 bid.  BMET today.  - Continue eplerenone 50 mg daily.  - Continue Jardiance 10 mg daily.  - He does not appear to need Lasix.  - Patient is not a candidate for another intravascular ICD.  Limited utility to implanting subcutaneous ICD in the setting of a nonischemic cardiomyopathy.   2. PAD: Right claudication and evidence on last peripheral arterial dopplers of significant right leg disease.     - Stay active.  - Continue Plavix 75 and Crestor 40 mg daily + Zetia 10 mg daily.  - Needs to quit smoking => he has been unable to do this.  - Has followup with Dr. Fletcher Anon to decide on peripheral angiography.  3. HTN: BP mildly elevated, increase Coreg today.  4. Hyperlipidemia: Good LDL in 3/22.  5. PVCs: Frequent in past, may contribute to cardiomyopathy.  7/22 Zio monitor on amiodarone showed PVC percentage down to 1.8%.  - Decrease amiodarone to 100 mg daily.  Check LFTs/TSH today and will need regular eye exam.   Followup in 4 months.   Loralie Champagne 08/02/2021

## 2021-08-03 ENCOUNTER — Telehealth (HOSPITAL_COMMUNITY): Payer: Self-pay | Admitting: *Deleted

## 2021-08-03 ENCOUNTER — Other Ambulatory Visit (HOSPITAL_COMMUNITY): Payer: Self-pay | Admitting: *Deleted

## 2021-08-03 DIAGNOSIS — I5042 Chronic combined systolic (congestive) and diastolic (congestive) heart failure: Secondary | ICD-10-CM

## 2021-08-03 DIAGNOSIS — R7989 Other specified abnormal findings of blood chemistry: Secondary | ICD-10-CM

## 2021-08-03 DIAGNOSIS — Z23 Encounter for immunization: Secondary | ICD-10-CM | POA: Diagnosis not present

## 2021-08-03 NOTE — Telephone Encounter (Signed)
Called patient per Dr. Aundra Dubin and instructed him to cut back on any alcohol intake based on elevated liver function studies on labs at his clinic visit this week. He will need repeat CMP in 2 weeks, scheduled and ordered. Pt verbalized understanding of same.  Zada Girt RN, [place] Coordinator [phone]

## 2021-08-04 ENCOUNTER — Other Ambulatory Visit (HOSPITAL_COMMUNITY): Payer: Self-pay | Admitting: *Deleted

## 2021-08-04 ENCOUNTER — Telehealth (HOSPITAL_COMMUNITY): Payer: Self-pay | Admitting: *Deleted

## 2021-08-04 DIAGNOSIS — I5042 Chronic combined systolic (congestive) and diastolic (congestive) heart failure: Secondary | ICD-10-CM

## 2021-08-04 NOTE — Telephone Encounter (Signed)
Pt left vm requesting to have labs drawn at another office called back to get more information no answer/left vm for pt to return my call.

## 2021-08-04 NOTE — Telephone Encounter (Signed)
Open end error

## 2021-08-18 ENCOUNTER — Other Ambulatory Visit: Payer: Self-pay

## 2021-08-18 ENCOUNTER — Other Ambulatory Visit (HOSPITAL_COMMUNITY): Payer: Medicare Other

## 2021-08-18 DIAGNOSIS — I5042 Chronic combined systolic (congestive) and diastolic (congestive) heart failure: Secondary | ICD-10-CM

## 2021-08-18 LAB — COMPREHENSIVE METABOLIC PANEL
ALT: 35 IU/L (ref 0–44)
AST: 33 IU/L (ref 0–40)
Albumin/Globulin Ratio: 2 (ref 1.2–2.2)
Albumin: 3.9 g/dL (ref 3.7–4.7)
Alkaline Phosphatase: 94 IU/L (ref 44–121)
BUN/Creatinine Ratio: 13 (ref 10–24)
BUN: 16 mg/dL (ref 8–27)
Bilirubin Total: 0.3 mg/dL (ref 0.0–1.2)
CO2: 24 mmol/L (ref 20–29)
Calcium: 9 mg/dL (ref 8.6–10.2)
Chloride: 101 mmol/L (ref 96–106)
Creatinine, Ser: 1.2 mg/dL (ref 0.76–1.27)
Globulin, Total: 2 g/dL (ref 1.5–4.5)
Glucose: 105 mg/dL — ABNORMAL HIGH (ref 70–99)
Potassium: 4.9 mmol/L (ref 3.5–5.2)
Sodium: 138 mmol/L (ref 134–144)
Total Protein: 5.9 g/dL — ABNORMAL LOW (ref 6.0–8.5)
eGFR: 62 mL/min/{1.73_m2} (ref >59)

## 2021-11-04 ENCOUNTER — Other Ambulatory Visit: Payer: Self-pay | Admitting: Cardiovascular Disease

## 2021-11-06 NOTE — Telephone Encounter (Signed)
This is a [facility] patient.  °

## 2021-11-07 ENCOUNTER — Encounter: Payer: Self-pay | Admitting: Cardiovascular Disease

## 2021-11-07 ENCOUNTER — Other Ambulatory Visit: Payer: Self-pay

## 2021-11-07 ENCOUNTER — Ambulatory Visit (INDEPENDENT_AMBULATORY_CARE_PROVIDER_SITE_OTHER): Payer: Medicare Other | Admitting: Cardiovascular Disease

## 2021-11-07 VITALS — BP 90/52 | HR 57 | Ht 69.0 in | Wt 140.0 lb

## 2021-11-07 DIAGNOSIS — I2510 Atherosclerotic heart disease of native coronary artery without angina pectoris: Secondary | ICD-10-CM | POA: Diagnosis not present

## 2021-11-07 DIAGNOSIS — I739 Peripheral vascular disease, unspecified: Secondary | ICD-10-CM | POA: Diagnosis not present

## 2021-11-07 DIAGNOSIS — E785 Hyperlipidemia, unspecified: Secondary | ICD-10-CM

## 2021-11-07 DIAGNOSIS — I5022 Chronic systolic (congestive) heart failure: Secondary | ICD-10-CM

## 2021-11-07 DIAGNOSIS — I5042 Chronic combined systolic (congestive) and diastolic (congestive) heart failure: Secondary | ICD-10-CM | POA: Diagnosis not present

## 2021-11-07 DIAGNOSIS — Z01818 Encounter for other preprocedural examination: Secondary | ICD-10-CM | POA: Diagnosis not present

## 2021-11-07 DIAGNOSIS — Z01812 Encounter for preprocedural laboratory examination: Secondary | ICD-10-CM

## 2021-11-07 DIAGNOSIS — I10 Essential (primary) hypertension: Secondary | ICD-10-CM | POA: Diagnosis not present

## 2021-11-07 LAB — CBC
Hematocrit: 43.7 % (ref 37.5–51.0)
Hemoglobin: 14.5 g/dL (ref 13.0–17.7)
MCH: 32.5 pg (ref 26.6–33.0)
MCHC: 33.2 g/dL (ref 31.5–35.7)
MCV: 98 fL — ABNORMAL HIGH (ref 79–97)
Platelets: 175 10*3/uL (ref 150–450)
RBC: 4.46 x10E6/uL (ref 4.14–5.80)
RDW: 12.7 % (ref 11.6–15.4)
WBC: 9.2 10*3/uL (ref 3.4–10.8)

## 2021-11-07 LAB — BASIC METABOLIC PANEL
BUN/Creatinine Ratio: 15 (ref 10–24)
BUN: 18 mg/dL (ref 8–27)
CO2: 25 mmol/L (ref 20–29)
Calcium: 9.3 mg/dL (ref 8.6–10.2)
Chloride: 101 mmol/L (ref 96–106)
Creatinine, Ser: 1.17 mg/dL (ref 0.76–1.27)
Glucose: 98 mg/dL (ref 70–99)
Potassium: 4.7 mmol/L (ref 3.5–5.2)
Sodium: 141 mmol/L (ref 134–144)
eGFR: 63 mL/min/{1.73_m2} (ref >59)

## 2021-11-07 NOTE — H&P (View-Only) (Signed)
Cardiology Office Note   Date:  11/07/2021   ID:  Nicholas Herrera, DOB 10-15-1941, MRN [id number]  PCP:  Jonathon Jordan, MD  Cardiologist:  Dr. Tamala Julian  Chief Complaint  Patient presents with   Follow-up    6 months.       History of Present Illness: Nicholas Herrera is a 81 y.o. male who is here today for a follow-up visit regarding peripheral arterial disease. He has known history of coronary artery disease, chronic systolic/diastolic heart failure, tobacco use and hypertension.  He is on amiodarone therapy for frequent PVCs. He has extensive peripheral arterial disease.  He has known occluded stent in the right proximal popliteal artery.  He is status post left SFA atherectomy and drug-coated balloon angioplasty in 2019, left common iliac and external iliac artery drug-coated balloon angioplasty in 2020.  He is also status post orbital atherectomy and balloon expandable stent placement to the right common iliac artery as well as angioplasty and drug-eluting stent placement to the right external iliac artery.  He had previous GI bleed due to diverticulosis.  He reports worsening bilateral leg claudication especially on the right side.  He reports gradual worsening of symptoms over the last 6 months.  Symptoms are happening now with short distance walking.  He has stable exertional dyspnea and no chest pain.  He continues to smoke.  Past Medical History:  Diagnosis Date   Abdominal distention    Abdominal pain    Bronchitis    hx of   Cancer (HCC)    skin   CHF (congestive heart failure) (HCC)     (hx of EF 27%) s/p AICD 2004. Most recent LVEF > 40% , 8/11   Chronic kidney disease    has small stones, no treatment   Cough    Depression    takes paxil   Diverticulosis    hx of   Dysrhythmia    Easy bruising    Hematuria    hx of   Hernia october 2012   [facility]   HTN (hypertension)    Hyperlipidemia    takes zocor   Inguinal hernia    Left groin pain     Past  Surgical History:  Procedure Laterality Date   ABDOMINAL AORTOGRAM W/LOWER EXTREMITY N/A 09/03/2018   Procedure: ABDOMINAL AORTOGRAM W/LOWER EXTREMITY;  Surgeon: Wellington Hampshire, MD;  Location: [place] [facility];  Service: Cardiovascular;  Laterality: N/A;   ABDOMINAL AORTOGRAM W/LOWER EXTREMITY N/A 07/29/2019   Procedure: ABDOMINAL AORTOGRAM W/LOWER EXTREMITY;  Surgeon: Wellington Hampshire, MD;  Location: [place] [facility];  Service: Cardiovascular;  Laterality: N/A;   ABDOMINAL AORTOGRAM W/LOWER EXTREMITY Bilateral 03/23/2020   Procedure: ABDOMINAL AORTOGRAM W/LOWER EXTREMITY;  Surgeon: Wellington Hampshire, MD;  Location: [place] [facility];  Service: Cardiovascular;  Laterality: Bilateral;   APPENDECTOMY     CARDIAC CATHETERIZATION     CARDIAC DEFIBRILLATOR REMOVAL  2005   HERNIA REPAIR  09/11/11   repair of [address]  09/11/2011   Procedure: HERNIA REPAIR INGUINAL ADULT;  Surgeon: Judieth Keens, DO;  Location: [place];  Service: General;  Laterality: Left;  open left inguinal hernia with mesh   LOWER EXTREMITY ANGIOGRAM N/A 02/05/2013   Procedure: LOWER EXTREMITY ANGIOGRAM;  Surgeon: Jettie Booze, MD;  Location: [facility];  Service: Cardiovascular;  Laterality: N/A;   PERIPHERAL VASCULAR BALLOON ANGIOPLASTY  07/29/2019   Procedure: PERIPHERAL VASCULAR BALLOON ANGIOPLASTY;  Surgeon:  Wellington Hampshire, MD;  Location: [place] [facility];  Service: Cardiovascular;;   PERIPHERAL VASCULAR INTERVENTION Left 09/03/2018   Procedure: PERIPHERAL VASCULAR INTERVENTION;  Surgeon: Wellington Hampshire, MD;  Location: [place] [facility];  Service: Cardiovascular;  Laterality: Left;   PERIPHERAL VASCULAR INTERVENTION Right 03/23/2020   Procedure: PERIPHERAL VASCULAR INTERVENTION;  Surgeon: Wellington Hampshire, MD;  Location: [place] [facility];  Service: Cardiovascular;  Laterality: Right;   RIGHT/LEFT HEART CATH AND CORONARY ANGIOGRAPHY N/A 12/15/2019   Procedure: RIGHT/LEFT HEART  CATH AND CORONARY ANGIOGRAPHY;  Surgeon: [name], MD;  Location: [place] [facility];  Service: Cardiovascular;  Laterality: N/A;     Current Outpatient Medications  Medication Sig Dispense Refill   amiodarone (PACERONE) 200 MG tablet Take 0.5 tablets (100 mg total) by mouth daily. 45 tablet 3   carvedilol (COREG) 12.5 MG tablet Take 1.5 tablets (18.75 mg total) by mouth 2 (two) times daily with a meal. 270 tablet 3   clopidogrel (PLAVIX) 75 MG tablet Take 1 tablet (75 mg total) by mouth daily with breakfast. 30 tablet 11   diphenhydramine-acetaminophen (TYLENOL PM) 25-500 MG TABS tablet Take 2 tablets by mouth at bedtime.     ENTRESTO 97-103 MG TAKE 1 TABLET BY MOUTH TWICE A DAY 60 tablet 11   eplerenone (INSPRA) 50 MG tablet Take 1 tablet (50 mg total) by mouth daily. 90 tablet 3   ezetimibe (ZETIA) 10 MG tablet TAKE 1 TABLET BY MOUTH EVERY DAY 90 tablet 3   JARDIANCE 10 MG TABS tablet TAKE 1 TABLET (10 MG TOTAL) BY MOUTH DAILY BEFORE BREAKFAST. 30 tablet 6   mirabegron ER (MYRBETRIQ) 50 MG TB24 tablet Take 50 mg by mouth daily.     Multiple Vitamins-Minerals (ONE-A-DAY MENS 50+ ADVANTAGE PO) Take 1 tablet by mouth daily.     PARoxetine (PAXIL) 40 MG tablet Take 40 mg by mouth daily.     rosuvastatin (CRESTOR) 40 MG tablet Take 1 tablet (40 mg total) by mouth daily. 90 tablet 3   sodium chloride (OCEAN) 0.65 % SOLN nasal spray Place 1 spray into both nostrils daily as needed for congestion.     venlafaxine XR (EFFEXOR-XR) 75 MG 24 hr capsule Take 75 mg by mouth daily.     No current facility-administered medications for this visit.    Allergies:   Spironolactone and Sulfa antibiotics    Social History:  The patient  reports that he has been smoking cigars and cigarettes. He has a 48.00 pack-year smoking history. He has never used smokeless tobacco. He reports current alcohol use of about 7.0 standard drinks per week. He reports that he does not use drugs.   Family History:  The  patient's family history includes Cancer in his mother.    ROS:  Please see the history of present illness.   Otherwise, review of systems are positive for none.   All other systems are reviewed and negative.    PHYSICAL EXAM: VS:  BP (!) 90/52 (BP Location: Left Arm, Patient Position: Sitting, Cuff Size: Normal)    Pulse (!) 57    Ht 5\' 9"  (1.753 m)    Wt 140 lb (63.5 kg)    BMI 20.67 kg/m  , BMI Body mass index is 20.67 kg/m. GEN: Well nourished, well developed, in no acute distress  HEENT: normal  Neck: no JVD, carotid bruits, or masses Cardiac: RRR; no murmurs, rubs, or gallops,no edema  Respiratory:  clear to auscultation bilaterally, normal work  of breathing GI: soft, nontender, nondistended, + BS MS: no deformity or atrophy  Skin: warm and dry, no rash Neuro:  Strength and sensation are intact Psych: euthymic mood, full affect Vascular: Femoral pulse: Barely palpable bilaterally.  Distal pulses are not palpable.  EKG:  EKG is not ordered today.   Recent Labs: 05/02/2021: Hemoglobin 15.2; Platelets 188 08/02/2021: TSH 0.527 08/18/2021: ALT 35; BUN 16; Creatinine, Ser 1.20; Potassium 4.9; Sodium 138    Lipid Panel    Component Value Date/Time   CHOL 114 01/25/2021 1210   CHOL 115 02/19/2020 0914   TRIG 56 01/25/2021 1210   HDL 53 01/25/2021 1210   HDL 50 02/19/2020 0914   CHOLHDL 2.2 01/25/2021 1210   VLDL 11 01/25/2021 1210   LDLCALC 50 01/25/2021 1210   LDLCALC 51 02/19/2020 0914      Wt Readings from Last 3 Encounters:  11/07/21 140 lb (63.5 kg)  08/02/21 143 lb 6.4 oz (65 kg)  05/02/21 141 lb 6.4 oz (64.1 kg)       No flowsheet data found.    ASSESSMENT AND PLAN:   1.  Peripheral arterial disease: Status post bilateral endovascular intervention on iliac arteries with known chronically occluded right popliteal artery stent.  Most recent Doppler studies showed significant restenosis in the iliac stents.  He reports worsening bilateral leg claudication  especially on the right side with gradual worsening over the last few months. 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  He feels very limited by his symptoms and wants some relief.  Thus, I recommend proceeding with abdominal aortogram with lower extremity runoff and possible endovascular intervention.  I discussed the procedure in details as well as risk and benefits.  Planned access is via right common femoral artery.    2. Tobacco use: I discussed with him the correlation between restenosis and tobacco use that he has been smoking for more than 50 years and reports inability to quit.  3. Hyperlipidemia: I reviewed most recent lipid profile which showed an LDL of 50 and triglyceride of 56.  Continue treatment with rosuvastatin and Zetia.   4. Coronary artery disease: No anginal symptoms.    5. Chronic systolic heart failure: He is followed by the heart failure clinic .  He is currently on optimal medical therapy.  Blood pressure is on the low side but he denies dizziness.  If blood pressure remains low, consider adding additional dose of Entresto.  6.  Essential hypertension: Blood pressure is reasonably controlled on current medications    Disposition:   Proceed with an angiogram and follow-up after.   Signed,  Kathlyn Sacramento, MD  11/07/2021 9:59 AM    [place]

## 2021-11-07 NOTE — Patient Instructions (Signed)
Medication Instructions:  No changes *If you need a refill on your cardiac medications before your next appointment, please call your pharmacy*   Testing/Procedures: Your physician has requested that you have a peripheral vascular angiogram. This exam is performed at the hospital. During this exam IV contrast is used to look at arterial blood flow. Please review the information sheet given for details.   Follow-Up: At [facility], you and your health needs are our priority.  As part of our continuing mission to provide you with exceptional heart care, we have created designated Provider Care Teams.  These Care Teams include your primary Cardiologist (physician) and Advanced Practice Providers (APPs -  Physician Assistants and Nurse Practitioners) who all work together to provide you with the care you need, when you need it.  We recommend signing up for the patient portal called "MyChart".  Sign up information is provided on this After Visit Summary.  MyChart is used to connect with patients for Virtual Visits (Telemedicine).  Patients are able to view lab/test results, encounter notes, upcoming appointments, etc.  Non-urgent messages can be sent to your provider as well.   To learn more about what you can do with MyChart, go to [email or web address].    Your next appointment:   Keep your post procedure follow up with Dr. Fletcher Anon on 2/28 at 10:20  Other Instructions  [address] [place] [address] Dept: [phone] Loc: [place]  11/07/2021  You are scheduled for a Peripheral Angiogram on Wednesday, January 25 with Dr. Kathlyn Sacramento.  1. Please arrive at the [facility] (Main Entrance A) at [facility]: [address] [place], [place] [address] at 10:30 AM (This time is two hours before your procedure to ensure your preparation). Free valet parking  service is available.   Special note: Every effort is made to have your procedure done on time. Please understand that emergencies sometimes delay scheduled procedures.  2. Diet: Do not eat solid foods after midnight.  The patient may have clear liquids until 5am upon the day of the procedure.  3. Labs: You will need to have blood drawn on 11/07/21: BMET and CBC  4. Medication instructions in preparation for your procedure: Hold all the diabetic medication the morning of the procedure (Jardiance)  On the morning of your procedure, take your Aspirin and Plavix/Clopidogrel and any morning medicines NOT listed above.  You may use sips of water.  5. Plan for one night stay--bring personal belongings. 6. Bring a current list of your medications and current insurance cards. 7. You MUST have a responsible person to drive you home. 8. Someone MUST be with you the first 24 hours after you arrive home or your discharge will be delayed. 9. Please wear clothes that are easy to get on and off and wear slip-on shoes.  Thank you for allowing [place] to care for you!   -- [place] [facility]

## 2021-11-07 NOTE — Progress Notes (Signed)
Cardiology Office Note   Date:  11/07/2021   ID:  [name], DOB 1941-01-28, MRN [id number]  PCP:  Jonathon Jordan, MD  Cardiologist:  Dr. Tamala Julian  Chief Complaint  Patient presents with   Follow-up    6 months.       History of Present Illness: Nicholas Herrera is a 81 y.o. male who is here today for a follow-up visit regarding peripheral arterial disease. He has known history of coronary artery disease, chronic systolic/diastolic heart failure, tobacco use and hypertension.  He is on amiodarone therapy for frequent PVCs. He has extensive peripheral arterial disease.  He has known occluded stent in the right proximal popliteal artery.  He is status post left SFA atherectomy and drug-coated balloon angioplasty in 2019, left common iliac and external iliac artery drug-coated balloon angioplasty in 2020.  He is also status post orbital atherectomy and balloon expandable stent placement to the right common iliac artery as well as angioplasty and drug-eluting stent placement to the right external iliac artery.  He had previous GI bleed due to diverticulosis.  He reports worsening bilateral leg claudication especially on the right side.  He reports gradual worsening of symptoms over the last 6 months.  Symptoms are happening now with short distance walking.  He has stable exertional dyspnea and no chest pain.  He continues to smoke.  Past Medical History:  Diagnosis Date   Abdominal distention    Abdominal pain    Bronchitis    hx of   Cancer (HCC)    skin   CHF (congestive heart failure) (HCC)     (hx of EF 27%) s/p AICD 2004. Most recent LVEF > 40% , 8/11   Chronic kidney disease    has small stones, no treatment   Cough    Depression    takes paxil   Diverticulosis    hx of   Dysrhythmia    Easy bruising    Hematuria    hx of   Hernia october 2012   [facility]   HTN (hypertension)    Hyperlipidemia    takes zocor   Inguinal hernia    Left groin pain     Past  Surgical History:  Procedure Laterality Date   ABDOMINAL AORTOGRAM W/LOWER EXTREMITY N/A 09/03/2018   Procedure: ABDOMINAL AORTOGRAM W/LOWER EXTREMITY;  Surgeon: Wellington Hampshire, MD;  Location: [place] [facility];  Service: Cardiovascular;  Laterality: N/A;   ABDOMINAL AORTOGRAM W/LOWER EXTREMITY N/A 07/29/2019   Procedure: ABDOMINAL AORTOGRAM W/LOWER EXTREMITY;  Surgeon: Wellington Hampshire, MD;  Location: [place] [facility];  Service: Cardiovascular;  Laterality: N/A;   ABDOMINAL AORTOGRAM W/LOWER EXTREMITY Bilateral 03/23/2020   Procedure: ABDOMINAL AORTOGRAM W/LOWER EXTREMITY;  Surgeon: Wellington Hampshire, MD;  Location: [place] [facility];  Service: Cardiovascular;  Laterality: Bilateral;   APPENDECTOMY     CARDIAC CATHETERIZATION     CARDIAC DEFIBRILLATOR REMOVAL  2005   HERNIA REPAIR  09/11/11   repair of [place]  09/11/2011   Procedure: HERNIA REPAIR INGUINAL ADULT;  Surgeon: Judieth Keens, DO;  Location: [place];  Service: General;  Laterality: Left;  open left inguinal hernia with mesh   LOWER EXTREMITY ANGIOGRAM N/A 02/05/2013   Procedure: LOWER EXTREMITY ANGIOGRAM;  Surgeon: Jettie Booze, MD;  Location: [facility] CATH LAB;  Service: Cardiovascular;  Laterality: N/A;   PERIPHERAL VASCULAR BALLOON ANGIOPLASTY  07/29/2019   Procedure: PERIPHERAL VASCULAR BALLOON ANGIOPLASTY;  Surgeon:  Wellington Hampshire, MD;  Location: [place] [facility];  Service: Cardiovascular;;   PERIPHERAL VASCULAR INTERVENTION Left 09/03/2018   Procedure: PERIPHERAL VASCULAR INTERVENTION;  Surgeon: Wellington Hampshire, MD;  Location: [place] [facility];  Service: Cardiovascular;  Laterality: Left;   PERIPHERAL VASCULAR INTERVENTION Right 03/23/2020   Procedure: PERIPHERAL VASCULAR INTERVENTION;  Surgeon: Wellington Hampshire, MD;  Location: [place] [facility];  Service: Cardiovascular;  Laterality: Right;   RIGHT/LEFT HEART CATH AND CORONARY ANGIOGRAPHY N/A 12/15/2019   Procedure: RIGHT/LEFT HEART  CATH AND CORONARY ANGIOGRAPHY;  Surgeon: [name], MD;  Location: [place] [facility];  Service: Cardiovascular;  Laterality: N/A;     Current Outpatient Medications  Medication Sig Dispense Refill   amiodarone (PACERONE) 200 MG tablet Take 0.5 tablets (100 mg total) by mouth daily. 45 tablet 3   carvedilol (COREG) 12.5 MG tablet Take 1.5 tablets (18.75 mg total) by mouth 2 (two) times daily with a meal. 270 tablet 3   clopidogrel (PLAVIX) 75 MG tablet Take 1 tablet (75 mg total) by mouth daily with breakfast. 30 tablet 11   diphenhydramine-acetaminophen (TYLENOL PM) 25-500 MG TABS tablet Take 2 tablets by mouth at bedtime.     ENTRESTO 97-103 MG TAKE 1 TABLET BY MOUTH TWICE A DAY 60 tablet 11   eplerenone (INSPRA) 50 MG tablet Take 1 tablet (50 mg total) by mouth daily. 90 tablet 3   ezetimibe (ZETIA) 10 MG tablet TAKE 1 TABLET BY MOUTH EVERY DAY 90 tablet 3   JARDIANCE 10 MG TABS tablet TAKE 1 TABLET (10 MG TOTAL) BY MOUTH DAILY BEFORE BREAKFAST. 30 tablet 6   mirabegron ER (MYRBETRIQ) 50 MG TB24 tablet Take 50 mg by mouth daily.     Multiple Vitamins-Minerals (ONE-A-DAY MENS 50+ ADVANTAGE PO) Take 1 tablet by mouth daily.     PARoxetine (PAXIL) 40 MG tablet Take 40 mg by mouth daily.     rosuvastatin (CRESTOR) 40 MG tablet Take 1 tablet (40 mg total) by mouth daily. 90 tablet 3   sodium chloride (OCEAN) 0.65 % SOLN nasal spray Place 1 spray into both nostrils daily as needed for congestion.     venlafaxine XR (EFFEXOR-XR) 75 MG 24 hr capsule Take 75 mg by mouth daily.     No current facility-administered medications for this visit.    Allergies:   Spironolactone and Sulfa antibiotics    Social History:  The patient  reports that he has been smoking cigars and cigarettes. He has a 48.00 pack-year smoking history. He has never used smokeless tobacco. He reports current alcohol use of about 7.0 standard drinks per week. He reports that he does not use drugs.   Family History:  The  patient's family history includes Cancer in his mother.    ROS:  Please see the history of present illness.   Otherwise, review of systems are positive for none.   All other systems are reviewed and negative.    PHYSICAL EXAM: VS:  BP (!) 90/52 (BP Location: Left Arm, Patient Position: Sitting, Cuff Size: Normal)    Pulse (!) 57    Ht 5\' 9"  (1.753 m)    Wt 140 lb (63.5 kg)    BMI 20.67 kg/m  , BMI Body mass index is 20.67 kg/m. GEN: Well nourished, well developed, in no acute distress  HEENT: normal  Neck: no JVD, carotid bruits, or masses Cardiac: RRR; no murmurs, rubs, or gallops,no edema  Respiratory:  clear to auscultation bilaterally, normal work  of breathing GI: soft, nontender, nondistended, + BS MS: no deformity or atrophy  Skin: warm and dry, no rash Neuro:  Strength and sensation are intact Psych: euthymic mood, full affect Vascular: Femoral pulse: Barely palpable bilaterally.  Distal pulses are not palpable.  EKG:  EKG is not ordered today.   Recent Labs: 05/02/2021: Hemoglobin 15.2; Platelets 188 08/02/2021: TSH 0.527 08/18/2021: ALT 35; BUN 16; Creatinine, Ser 1.20; Potassium 4.9; Sodium 138    Lipid Panel    Component Value Date/Time   CHOL 114 01/25/2021 1210   CHOL 115 02/19/2020 0914   TRIG 56 01/25/2021 1210   HDL 53 01/25/2021 1210   HDL 50 02/19/2020 0914   CHOLHDL 2.2 01/25/2021 1210   VLDL 11 01/25/2021 1210   LDLCALC 50 01/25/2021 1210   LDLCALC 51 02/19/2020 0914      Wt Readings from Last 3 Encounters:  11/07/21 140 lb (63.5 kg)  08/02/21 143 lb 6.4 oz (65 kg)  05/02/21 141 lb 6.4 oz (64.1 kg)       No flowsheet data found.    ASSESSMENT AND PLAN:   1.  Peripheral arterial disease: Status post bilateral endovascular intervention on iliac arteries with known chronically occluded right popliteal artery stent.  Most recent Doppler studies showed significant restenosis in the iliac stents.  He reports worsening bilateral leg claudication  especially on the right side with gradual worsening over the last few months. 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  He feels very limited by his symptoms and wants some relief.  Thus, I recommend proceeding with abdominal aortogram with lower extremity runoff and possible endovascular intervention.  I discussed the procedure in details as well as risk and benefits.  Planned access is via right common femoral artery.    2. Tobacco use: I discussed with him the correlation between restenosis and tobacco use that he has been smoking for more than 50 years and reports inability to quit.  3. Hyperlipidemia: I reviewed most recent lipid profile which showed an LDL of 50 and triglyceride of 56.  Continue treatment with rosuvastatin and Zetia.   4. Coronary artery disease: No anginal symptoms.    5. Chronic systolic heart failure: He is followed by the heart failure clinic .  He is currently on optimal medical therapy.  Blood pressure is on the low side but he denies dizziness.  If blood pressure remains low, consider adding additional dose of Entresto.  6.  Essential hypertension: Blood pressure is reasonably controlled on current medications    Disposition:   Proceed with an angiogram and follow-up after.   Signed,  Kathlyn Sacramento, MD  11/07/2021 9:59 AM    [place]

## 2021-11-21 ENCOUNTER — Telehealth: Payer: Self-pay | Admitting: *Deleted

## 2021-11-21 NOTE — Telephone Encounter (Addendum)
Abdominal aortogram scheduled at [facility] for: Wednesday November 22, 2021 12:30 PM [facility] Main Entrance [facility]) at: 10:30 AM   Diet-no solid food after midnight prior to cath, clear liquids until 5 AM day of procedure.  Medication instructions for procedure: -Hold:  Jardiance-AM of procedure  Patient reports he takes Inspra at bedtime. -Except hold medication usual morning medications can be taken pre-cath with sips of water including aspirin 81 mg and Plavix 75 mg.    Confirmed patient has responsible adult to drive home post procedure and be with patient first 24 hours after arriving home.  [facility] does allow one visitor to accompany you and wait in the hospital waiting room while you are there for your procedure. You and your visitor will be asked to wear a mask once you enter the hospital.   Patient reports does not currently have any new symptoms concerning for COVID-19 and no household members with COVID-19 like illness.

## 2021-11-22 ENCOUNTER — Encounter (HOSPITAL_COMMUNITY)
Admission: RE | Disposition: A | Discharge status: Home / Self Care | Payer: Self-pay | Source: Home / Self Care | Attending: Cardiovascular Disease

## 2021-11-22 ENCOUNTER — Other Ambulatory Visit: Payer: Self-pay

## 2021-11-22 ENCOUNTER — Ambulatory Visit (HOSPITAL_COMMUNITY)
Admission: RE | Admit: 2021-11-22 | Discharge: 2021-11-22 | Disposition: A | Discharge status: Home / Self Care | Payer: Medicare Other | Attending: Cardiovascular Disease | Admitting: Cardiovascular Disease

## 2021-11-22 DIAGNOSIS — I739 Peripheral vascular disease, unspecified: Secondary | ICD-10-CM

## 2021-11-22 DIAGNOSIS — Z95828 Presence of other vascular implants and grafts: Secondary | ICD-10-CM | POA: Diagnosis not present

## 2021-11-22 DIAGNOSIS — Y831 Surgical operation with implant of artificial internal device as the cause of abnormal reaction of the patient, or of later complication, without mention of misadventure at the time of the procedure: Secondary | ICD-10-CM | POA: Insufficient documentation

## 2021-11-22 DIAGNOSIS — E785 Hyperlipidemia, unspecified: Secondary | ICD-10-CM | POA: Insufficient documentation

## 2021-11-22 DIAGNOSIS — I5042 Chronic combined systolic (congestive) and diastolic (congestive) heart failure: Secondary | ICD-10-CM | POA: Insufficient documentation

## 2021-11-22 DIAGNOSIS — T82858A Stenosis of vascular prosthetic devices, implants and grafts, initial encounter: Secondary | ICD-10-CM | POA: Insufficient documentation

## 2021-11-22 DIAGNOSIS — F17290 Nicotine dependence, other tobacco product, uncomplicated: Secondary | ICD-10-CM | POA: Diagnosis not present

## 2021-11-22 DIAGNOSIS — I70211 Atherosclerosis of native arteries of extremities with intermittent claudication, right leg: Secondary | ICD-10-CM | POA: Insufficient documentation

## 2021-11-22 DIAGNOSIS — I130 Hypertensive heart and chronic kidney disease with heart failure and stage 1 through stage 4 chronic kidney disease, or unspecified chronic kidney disease: Secondary | ICD-10-CM | POA: Diagnosis not present

## 2021-11-22 DIAGNOSIS — I2510 Atherosclerotic heart disease of native coronary artery without angina pectoris: Secondary | ICD-10-CM | POA: Insufficient documentation

## 2021-11-22 DIAGNOSIS — N189 Chronic kidney disease, unspecified: Secondary | ICD-10-CM | POA: Diagnosis not present

## 2021-11-22 HISTORY — PX: ABDOMINAL AORTOGRAM W/LOWER EXTREMITY: CATH118223

## 2021-11-22 HISTORY — PX: PERIPHERAL VASCULAR BALLOON ANGIOPLASTY: CATH118281

## 2021-11-22 LAB — POCT ACTIVATED CLOTTING TIME: Activated Clotting Time: 215 seconds

## 2021-11-22 SURGERY — ABDOMINAL AORTOGRAM W/LOWER EXTREMITY
Anesthesia: LOCAL

## 2021-11-22 MED ORDER — SODIUM CHLORIDE 0.9% FLUSH: 3.0000 mL | Freq: Two times a day (BID) | INTRAVENOUS | Status: DC

## 2021-11-22 MED ORDER — ACETAMINOPHEN 325 MG PO TABS: 650.0000 mg | ORAL_TABLET | ORAL | Status: DC | PRN

## 2021-11-22 MED ORDER — FENTANYL CITRATE (PF) 100 MCG/2ML IJ SOLN
INTRAMUSCULAR | Status: DC | PRN
  Administered 2021-11-22: 50 ug via INTRAVENOUS

## 2021-11-22 MED ORDER — HEPARIN (PORCINE) IN NACL 1000-0.9 UT/500ML-% IV SOLN
INTRAVENOUS | Status: DC | PRN
  Administered 2021-11-22 (×2): 500 mL

## 2021-11-22 MED ORDER — ASPIRIN 81 MG PO CHEW: 81.0000 mg | CHEWABLE_TABLET | ORAL | Status: DC

## 2021-11-22 MED ORDER — IODIXANOL 320 MG/ML IV SOLN
INTRAVENOUS | Status: DC | PRN
  Administered 2021-11-22: 14:00:00 120 mL

## 2021-11-22 MED ORDER — SODIUM CHLORIDE 0.9 % IV SOLN: 250.0000 mL | INTRAVENOUS | Status: DC | PRN

## 2021-11-22 MED ORDER — HEPARIN SODIUM (PORCINE) 1000 UNIT/ML IJ SOLN
INTRAMUSCULAR | Status: DC | PRN
  Administered 2021-11-22: 4000 [IU] via INTRAVENOUS

## 2021-11-22 MED ORDER — SODIUM CHLORIDE 0.9% FLUSH: 3.0000 mL | INTRAVENOUS | Status: DC | PRN

## 2021-11-22 MED ORDER — MIDAZOLAM HCL 2 MG/2ML IJ SOLN
INTRAMUSCULAR | Status: AC
  Filled 2021-11-22: qty 2

## 2021-11-22 MED ORDER — MIDAZOLAM HCL 2 MG/2ML IJ SOLN
INTRAMUSCULAR | Status: DC | PRN
  Administered 2021-11-22: 1 mg via INTRAVENOUS

## 2021-11-22 MED ORDER — ONDANSETRON HCL 4 MG/2ML IJ SOLN: 4.0000 mg | Freq: Four times a day (QID) | INTRAMUSCULAR | Status: DC | PRN

## 2021-11-22 MED ORDER — FENTANYL CITRATE (PF) 100 MCG/2ML IJ SOLN
INTRAMUSCULAR | Status: AC
  Filled 2021-11-22: qty 2

## 2021-11-22 MED ORDER — LIDOCAINE HCL (PF) 1 % IJ SOLN
INTRAMUSCULAR | Status: AC
  Filled 2021-11-22: qty 30

## 2021-11-22 MED ORDER — LIDOCAINE HCL (PF) 1 % IJ SOLN
INTRAMUSCULAR | Status: DC | PRN
  Administered 2021-11-22: 15 mL

## 2021-11-22 MED ORDER — SODIUM CHLORIDE 0.9 % IV SOLN: INTRAVENOUS | Status: DC

## 2021-11-22 MED ORDER — HEPARIN SODIUM (PORCINE) 1000 UNIT/ML IJ SOLN
INTRAMUSCULAR | Status: AC
  Filled 2021-11-22: qty 10

## 2021-11-22 SURGICAL SUPPLY — 16 items
CATH ANGIO 5F PIGTAIL 65CM (CATHETERS) ×1 IMPLANT
DCB RANGER 7.0X40 135 (BALLOONS) IMPLANT
DEVICE CLOSURE MYNXGRIP 6/7F (Vascular Products) ×1 IMPLANT
KIT ENCORE 26 ADVANTAGE (KITS) ×1 IMPLANT
KIT PV (KITS) ×3 IMPLANT
RANGER DCB 7.0X40 135 (BALLOONS) ×3
SHEATH BRITE TIP 6FR 35CM (SHEATH) ×1 IMPLANT
SHEATH PINNACLE 5F 10CM (SHEATH) ×1 IMPLANT
SHEATH PINNACLE 6F 10CM (SHEATH) ×1 IMPLANT
SHEATH PROBE COVER 6X72 (BAG) ×1 IMPLANT
SYR MEDRAD MARK 7 150ML (SYRINGE) ×3 IMPLANT
TRANSDUCER W/STOPCOCK (MISCELLANEOUS) ×3 IMPLANT
TRAY PV CATH (CUSTOM PROCEDURE TRAY) ×3 IMPLANT
WIRE G V18X300CM (WIRE) ×1 IMPLANT
WIRE HITORQ VERSACORE ST 145CM (WIRE) ×1 IMPLANT
WIRE MICRO SET SILHO 5FR 7 (SHEATH) ×1 IMPLANT

## 2021-11-22 NOTE — Interval H&P Note (Signed)
History and Physical Interval Note:  11/22/2021 1:23 PM  Nicholas Herrera  has presented today for surgery, with the diagnosis of pad.  The various methods of treatment have been discussed with the patient and family. After consideration of risks, benefits and other options for treatment, the patient has consented to  Procedure(s): ABDOMINAL AORTOGRAM W/LOWER EXTREMITY (N/A) as a surgical intervention.  The patient's history has been reviewed, patient examined, no change in status, stable for surgery.  I have reviewed the patient's chart and labs.  Questions were answered to the patient's satisfaction.     Kathlyn Sacramento

## 2021-11-22 NOTE — Progress Notes (Signed)
Pt ambulated without difficulty or bleeding.   Discharged home with his wife who will drive and stay with pt x 24 hrs. 

## 2021-11-23 ENCOUNTER — Other Ambulatory Visit: Payer: Self-pay | Admitting: *Deleted

## 2021-11-23 ENCOUNTER — Encounter (HOSPITAL_COMMUNITY): Payer: Self-pay | Admitting: Cardiovascular Disease

## 2021-11-23 DIAGNOSIS — I739 Peripheral vascular disease, unspecified: Secondary | ICD-10-CM

## 2021-12-01 ENCOUNTER — Other Ambulatory Visit: Payer: Self-pay | Admitting: Cardiovascular Disease

## 2021-12-01 DIAGNOSIS — I739 Peripheral vascular disease, unspecified: Secondary | ICD-10-CM

## 2021-12-01 DIAGNOSIS — Z95828 Presence of other vascular implants and grafts: Secondary | ICD-10-CM

## 2021-12-08 ENCOUNTER — Ambulatory Visit (HOSPITAL_COMMUNITY): Payer: Medicare Other

## 2021-12-08 DIAGNOSIS — H43813 Vitreous degeneration, bilateral: Secondary | ICD-10-CM | POA: Diagnosis not present

## 2021-12-08 DIAGNOSIS — H35033 Hypertensive retinopathy, bilateral: Secondary | ICD-10-CM | POA: Diagnosis not present

## 2021-12-08 DIAGNOSIS — Z961 Presence of intraocular lens: Secondary | ICD-10-CM | POA: Diagnosis not present

## 2021-12-08 DIAGNOSIS — H52223 Regular astigmatism, bilateral: Secondary | ICD-10-CM | POA: Diagnosis not present

## 2021-12-20 DIAGNOSIS — Z85828 Personal history of other malignant neoplasm of skin: Secondary | ICD-10-CM | POA: Diagnosis not present

## 2021-12-20 DIAGNOSIS — D485 Neoplasm of uncertain behavior of skin: Secondary | ICD-10-CM | POA: Diagnosis not present

## 2021-12-20 DIAGNOSIS — L570 Actinic keratosis: Secondary | ICD-10-CM | POA: Diagnosis not present

## 2021-12-20 DIAGNOSIS — D1801 Hemangioma of skin and subcutaneous tissue: Secondary | ICD-10-CM | POA: Diagnosis not present

## 2021-12-20 DIAGNOSIS — D0439 Carcinoma in situ of skin of other parts of face: Secondary | ICD-10-CM | POA: Diagnosis not present

## 2021-12-22 ENCOUNTER — Ambulatory Visit (HOSPITAL_COMMUNITY)
Admission: RE | Admit: 2021-12-22 | Discharge: 2021-12-22 | Disposition: A | Discharge status: Home / Self Care | Payer: Medicare Other | Source: Ambulatory Visit | Attending: Cardiology | Admitting: Cardiology

## 2021-12-22 ENCOUNTER — Other Ambulatory Visit: Payer: Self-pay

## 2021-12-22 ENCOUNTER — Ambulatory Visit (HOSPITAL_BASED_OUTPATIENT_CLINIC_OR_DEPARTMENT_OTHER)
Admission: RE | Admit: 2021-12-22 | Discharge: 2021-12-22 | Disposition: A | Discharge status: Home / Self Care | Payer: Medicare Other | Source: Ambulatory Visit | Attending: Cardiology | Admitting: Cardiology

## 2021-12-22 DIAGNOSIS — I739 Peripheral vascular disease, unspecified: Secondary | ICD-10-CM

## 2021-12-22 DIAGNOSIS — Z95828 Presence of other vascular implants and grafts: Secondary | ICD-10-CM | POA: Diagnosis not present

## 2021-12-26 ENCOUNTER — Other Ambulatory Visit: Payer: Self-pay

## 2021-12-26 ENCOUNTER — Encounter: Payer: Self-pay | Admitting: Cardiovascular Disease

## 2021-12-26 ENCOUNTER — Other Ambulatory Visit: Payer: Self-pay | Admitting: *Deleted

## 2021-12-26 ENCOUNTER — Ambulatory Visit (INDEPENDENT_AMBULATORY_CARE_PROVIDER_SITE_OTHER): Payer: Medicare Other | Admitting: Cardiovascular Disease

## 2021-12-26 VITALS — BP 118/72 | HR 61 | Resp 20 | Ht 70.0 in | Wt 144.8 lb

## 2021-12-26 DIAGNOSIS — I2510 Atherosclerotic heart disease of native coronary artery without angina pectoris: Secondary | ICD-10-CM

## 2021-12-26 DIAGNOSIS — E785 Hyperlipidemia, unspecified: Secondary | ICD-10-CM | POA: Diagnosis not present

## 2021-12-26 DIAGNOSIS — Z720 Tobacco use: Secondary | ICD-10-CM

## 2021-12-26 DIAGNOSIS — I5022 Chronic systolic (congestive) heart failure: Secondary | ICD-10-CM

## 2021-12-26 DIAGNOSIS — I739 Peripheral vascular disease, unspecified: Secondary | ICD-10-CM

## 2021-12-26 DIAGNOSIS — I10 Essential (primary) hypertension: Secondary | ICD-10-CM

## 2021-12-26 NOTE — Progress Notes (Signed)
Cardiology Office Note   Date:  12/26/2021   ID:  Nicholas Herrera, DOB [birth year], MRN [id number]  PCP:  Jonathon Jordan, MD  Cardiologist:  Dr. Tamala Julian  No chief complaint on file.      History of Present Illness: Nicholas Herrera is a 81 y.o. male who is here today for a follow-up visit regarding peripheral arterial disease. He has known history of coronary artery disease, chronic systolic/diastolic heart failure, tobacco use and hypertension.  He is on amiodarone therapy for frequent PVCs. He has extensive peripheral arterial disease.  He has known occluded stent in the right proximal popliteal artery.  He is status post left SFA atherectomy and drug-coated balloon angioplasty in 2019, left common iliac and external iliac artery drug-coated balloon angioplasty in 2020.  He is also status post orbital atherectomy and balloon expandable stent placement to the right common iliac artery as well as angioplasty and drug-eluting stent placement to the right external iliac artery.  He had previous GI bleed due to diverticulosis.  He had worsening right leg claudication recently and thus I proceeded with angiography in January which showed patent right common iliac artery stent with severe restenosis, moderate left external iliac artery stenosis and occluded distal right SFA stent with collaterals and three-vessel runoff below the knee.  I performed successful drug-coated balloon angioplasty to the right common iliac artery with good results.  He reports significant improvement in right leg claudication.  No significant symptoms on the left side.  He is trying to walk more.  He denies chest pain or worsening dyspnea.  Past Medical History:  Diagnosis Date   Abdominal distention    Abdominal pain    Bronchitis    hx of   Cancer (HCC)    skin   CHF (congestive heart failure) (HCC)     (hx of EF 27%) s/p AICD 2004. Most recent LVEF > 40% , 8/11   Chronic kidney disease    has small stones,  no treatment   Cough    Depression    takes paxil   Diverticulosis    hx of   Dysrhythmia    Easy bruising    Hematuria    hx of   Hernia october 2012   [facility]   HTN (hypertension)    Hyperlipidemia    takes zocor   Inguinal hernia    Left groin pain     Past Surgical History:  Procedure Laterality Date   ABDOMINAL AORTOGRAM W/LOWER EXTREMITY N/A 09/03/2018   Procedure: ABDOMINAL AORTOGRAM W/LOWER EXTREMITY;  Surgeon: Wellington Hampshire, MD;  Location: [place] [facility];  Service: Cardiovascular;  Laterality: N/A;   ABDOMINAL AORTOGRAM W/LOWER EXTREMITY N/A 07/29/2019   Procedure: ABDOMINAL AORTOGRAM W/LOWER EXTREMITY;  Surgeon: Wellington Hampshire, MD;  Location: [place] [facility];  Service: Cardiovascular;  Laterality: N/A;   ABDOMINAL AORTOGRAM W/LOWER EXTREMITY Bilateral 03/23/2020   Procedure: ABDOMINAL AORTOGRAM W/LOWER EXTREMITY;  Surgeon: Wellington Hampshire, MD;  Location: [place] [facility];  Service: Cardiovascular;  Laterality: Bilateral;   ABDOMINAL AORTOGRAM W/LOWER EXTREMITY N/A 11/22/2021   Procedure: ABDOMINAL AORTOGRAM W/LOWER EXTREMITY;  Surgeon: Wellington Hampshire, MD;  Location: [place] [facility];  Service: Cardiovascular;  Laterality: N/A;   APPENDECTOMY     CARDIAC CATHETERIZATION     CARDIAC DEFIBRILLATOR REMOVAL  2005   HERNIA REPAIR  09/11/11   repair of [facility]    INGUINAL HERNIA REPAIR  09/11/2011   Procedure: HERNIA REPAIR INGUINAL ADULT;  Surgeon: Judieth Keens, DO;  Location: [place];  Service: General;  Laterality: Left;  open left inguinal hernia with mesh   LOWER EXTREMITY ANGIOGRAM N/A 02/05/2013   Procedure: LOWER EXTREMITY ANGIOGRAM;  Surgeon: Jettie Booze, MD;  Location: [facility] C[facility];  Service: Cardiovascular;  Laterality: N/A;   PERIPHERAL VASCULAR BALLOON ANGIOPLASTY  07/29/2019   Procedure: PERIPHERAL VASCULAR BALLOON ANGIOPLASTY;  Surgeon: Wellington Hampshire, MD;  Location: [place] [facility];  Service: Cardiovascular;;   PERIPHERAL VASCULAR  BALLOON ANGIOPLASTY  11/22/2021   Procedure: PERIPHERAL VASCULAR BALLOON ANGIOPLASTY;  Surgeon: Wellington Hampshire, MD;  Location: [place] [facility];  Service: Cardiovascular;;   PERIPHERAL VASCULAR INTERVENTION Left 09/03/2018   Procedure: PERIPHERAL VASCULAR INTERVENTION;  Surgeon: Wellington Hampshire, MD;  Location: [place] [facility];  Service: Cardiovascular;  Laterality: Left;   PERIPHERAL VASCULAR INTERVENTION Right 03/23/2020   Procedure: PERIPHERAL VASCULAR INTERVENTION;  Surgeon: Wellington Hampshire, MD;  Location: [place] [facility];  Service: Cardiovascular;  Laterality: Right;   RIGHT/LEFT HEART CATH AND CORONARY ANGIOGRAPHY N/A 12/15/2019   Procedure: RIGHT/LEFT HEART CATH AND CORONARY ANGIOGRAPHY;  Surgeon: [name], MD;  Location: [place] [facility];  Service: Cardiovascular;  Laterality: N/A;     Current Outpatient Medications  Medication Sig Dispense Refill   amiodarone (PACERONE) 200 MG tablet Take 0.5 tablets (100 mg total) by mouth daily. 45 tablet 3   carvedilol (COREG) 12.5 MG tablet Take 1.5 tablets (18.75 mg total) by mouth 2 (two) times daily with a meal. 270 tablet 3   clopidogrel (PLAVIX) 75 MG tablet Take 1 tablet (75 mg total) by mouth daily with breakfast. 30 tablet 11   diphenhydramine-acetaminophen (TYLENOL PM) 25-500 MG TABS tablet Take 2 tablets by mouth at bedtime.     ENTRESTO 97-103 MG TAKE 1 TABLET BY MOUTH TWICE A DAY 60 tablet 11   eplerenone (INSPRA) 50 MG tablet Take 1 tablet (50 mg total) by mouth daily. 90 tablet 3   ezetimibe (ZETIA) 10 MG tablet TAKE 1 TABLET BY MOUTH EVERY DAY 90 tablet 3   JARDIANCE 10 MG TABS tablet TAKE 1 TABLET (10 MG TOTAL) BY MOUTH DAILY BEFORE BREAKFAST. 30 tablet 6   mirabegron ER (MYRBETRIQ) 50 MG TB24 tablet Take 50 mg by mouth daily.     Multiple Vitamins-Minerals (ONE-A-DAY MENS 50+ ADVANTAGE PO) Take 1 tablet by mouth daily.     PARoxetine (PAXIL) 40 MG tablet Take 40 mg by mouth daily.     rosuvastatin (CRESTOR) 40  MG tablet Take 1 tablet (40 mg total) by mouth daily. 90 tablet 3   sodium chloride (OCEAN) 0.65 % SOLN nasal spray Place 1 spray into both nostrils daily as needed for congestion.     venlafaxine XR (EFFEXOR-XR) 75 MG 24 hr capsule Take 75 mg by mouth daily.     No current facility-administered medications for this visit.    Allergies:   Spironolactone and Sulfa antibiotics    Social History:  The patient  reports that he has been smoking cigars and cigarettes. He has a 48.00 pack-year smoking history. He has never used smokeless tobacco. He reports current alcohol use of about 7.0 standard drinks per week. He reports that he does not use drugs.   Family History:  The patient's family history includes Cancer in his mother.    ROS:  Please see the history of present illness.   Otherwise, review of systems are positive for none.   All other  systems are reviewed and negative.    PHYSICAL EXAM: VS:  BP 118/72 (BP Location: Left Arm, Patient Position: Sitting, Cuff Size: Normal)    Pulse 61    Resp 20    Ht 5\' 10"  (1.778 m)    Wt 144 lb 12.8 oz (65.7 kg)    SpO2 100%    BMI 20.78 kg/m  , BMI Body mass index is 20.78 kg/m. GEN: Well nourished, well developed, in no acute distress  HEENT: normal  Neck: no JVD, carotid bruits, or masses Cardiac: RRR; no murmurs, rubs, or gallops,no edema  Respiratory:  clear to auscultation bilaterally, normal work of breathing GI: soft, nontender, nondistended, + BS MS: no deformity or atrophy  Skin: warm and dry, no rash Neuro:  Strength and sensation are intact Psych: euthymic mood, full affect Vascular: Femoral pulse: +1 bilaterally with no groin hematoma  EKG:  EKG is not ordered today.   Recent Labs: 08/02/2021: TSH 0.527 08/18/2021: ALT 35 11/07/2021: BUN 18; Creatinine, Ser 1.17; Hemoglobin 14.5; Platelets 175; Potassium 4.7; Sodium 141    Lipid Panel    Component Value Date/Time   CHOL 114 01/25/2021 1210   CHOL 115 02/19/2020 0914    TRIG 56 01/25/2021 1210   HDL 53 01/25/2021 1210   HDL 50 02/19/2020 0914   CHOLHDL 2.2 01/25/2021 1210   VLDL 11 01/25/2021 1210   LDLCALC 50 01/25/2021 1210   LDLCALC 51 02/19/2020 0914      Wt Readings from Last 3 Encounters:  12/26/21 144 lb 12.8 oz (65.7 kg)  11/22/21 140 lb (63.5 kg)  11/07/21 140 lb (63.5 kg)       No flowsheet data found.    ASSESSMENT AND PLAN:   1.  Peripheral arterial disease: Status post bilateral endovascular intervention on iliac arteries with known chronically occluded right SFA/popliteal artery stent.   Status post recent drug-coated balloon angioplasty to right common iliac artery for in-stent restenosis.  Improved velocities to normal by duplex with improvement in ABI.  Also clinically, he reports significant improvement in claudication.  He is already on dual antiplatelet therapy.  I discussed with him the importance of 30 minutes of daily walking. Repeat Doppler studies in 6 months.    2. Tobacco use: I discussed with him the correlation between restenosis and tobacco use that he has been smoking for more than 50 years and reports inability to quit.  3. Hyperlipidemia: I reviewed most recent lipid profile which showed an LDL of 50 and triglyceride of 56.  Continue treatment with rosuvastatin and Zetia.   4. Coronary artery disease: No anginal symptoms.    5. Chronic systolic heart failure: He is followed by the heart failure clinic .  He is currently on optimal medical therapy.  He reports stable orthostatic dizziness on heart failure medications.  6.  Essential hypertension: Blood pressure is reasonably controlled on current medications    Disposition:   Follow-up in 6 months.   Signed,  Kathlyn Sacramento, MD  12/26/2021 8:58 AM    [facility]

## 2021-12-26 NOTE — Patient Instructions (Signed)
Medication Instructions:  °No changes °*If you need a refill on your cardiac medications before your next appointment, please call your pharmacy* ° ° °Lab Work: °None ordered °If you have labs (blood work) drawn today and your tests are completely normal, you will receive your results only by: °MyChart Message (if you have MyChart) OR °A paper copy in the mail °If you have any lab test that is abnormal or we need to change your treatment, we will call you to review the results. ° ° °Testing/Procedures: °None ordered ° ° °Follow-Up: °At [facility], you and your health needs are our priority.  As part of our continuing mission to provide you with exceptional heart care, we have created designated Provider Care Teams.  These Care Teams include your primary Cardiologist (physician) and Advanced Practice Providers (APPs -  Physician Assistants and Nurse Practitioners) who all work together to provide you with the care you need, when you need it. ° °We recommend signing up for the patient portal called "MyChart".  Sign up information is provided on this After Visit Summary.  MyChart is used to connect with patients for Virtual Visits (Telemedicine).  Patients are able to view lab/test results, encounter notes, upcoming appointments, etc.  Non-urgent messages can be sent to your provider as well.   °To learn more about what you can do with MyChart, go to [email or web address].   ° °Your next appointment:   °6 month(s) ° °The format for your next appointment:   °In Person ° °Provider:   °Dr. Arida ° °

## 2022-01-16 ENCOUNTER — Other Ambulatory Visit (HOSPITAL_COMMUNITY): Payer: Self-pay | Admitting: Cardiology

## 2022-01-29 ENCOUNTER — Other Ambulatory Visit (HOSPITAL_COMMUNITY): Payer: Self-pay | Admitting: Cardiology

## 2022-02-20 DIAGNOSIS — Z79899 Other long term (current) drug therapy: Secondary | ICD-10-CM | POA: Diagnosis not present

## 2022-02-20 DIAGNOSIS — C4491 Basal cell carcinoma of skin, unspecified: Secondary | ICD-10-CM | POA: Diagnosis not present

## 2022-02-20 DIAGNOSIS — R7303 Prediabetes: Secondary | ICD-10-CM | POA: Diagnosis not present

## 2022-02-20 DIAGNOSIS — E1169 Type 2 diabetes mellitus with other specified complication: Secondary | ICD-10-CM | POA: Diagnosis not present

## 2022-02-20 DIAGNOSIS — I10 Essential (primary) hypertension: Secondary | ICD-10-CM | POA: Diagnosis not present

## 2022-02-20 DIAGNOSIS — Z0000 Encounter for general adult medical examination without abnormal findings: Secondary | ICD-10-CM | POA: Diagnosis not present

## 2022-02-20 DIAGNOSIS — C4492 Squamous cell carcinoma of skin, unspecified: Secondary | ICD-10-CM | POA: Diagnosis not present

## 2022-02-20 DIAGNOSIS — M545 Low back pain, unspecified: Secondary | ICD-10-CM | POA: Diagnosis not present

## 2022-02-20 DIAGNOSIS — I2510 Atherosclerotic heart disease of native coronary artery without angina pectoris: Secondary | ICD-10-CM | POA: Diagnosis not present

## 2022-02-20 DIAGNOSIS — I5042 Chronic combined systolic (congestive) and diastolic (congestive) heart failure: Secondary | ICD-10-CM | POA: Diagnosis not present

## 2022-02-20 DIAGNOSIS — D692 Other nonthrombocytopenic purpura: Secondary | ICD-10-CM | POA: Diagnosis not present

## 2022-02-20 DIAGNOSIS — R42 Dizziness and giddiness: Secondary | ICD-10-CM | POA: Diagnosis not present

## 2022-02-20 DIAGNOSIS — I739 Peripheral vascular disease, unspecified: Secondary | ICD-10-CM | POA: Diagnosis not present

## 2022-02-24 NOTE — Progress Notes (Signed)
?Cardiology Office Note:   ? ?Date:  02/26/2022  ? ?ID:  Nicholas Herrera, DOB Mar 26, 1941, MRN [id number] ? ?PCP:  Jonathon Jordan, MD  ?Cardiologist:  Sinclair Grooms, MD  ? ?Referring MD: Jonathon Jordan, MD  ? ?Chief Complaint  ?Patient presents with  ? Coronary Artery Disease  ? Congestive Heart Failure  ? ? ?History of Present Illness:   ? ?Nicholas Herrera is a 81 y.o. 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 ? 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 ?He is doing well.  Claudication is better since a January procedure by Dr. Fletcher Anon. ? ?Patient is not having chest discomfort.  He does get dyspnea on exertion.  He continues to smoke.  His major concern and 1 voiced by his primary, Dr. Cheron Schaumann is relatively low blood pressures and orthostatic dizziness/near syncope.  He also complains of feeling fatigued most of the time. ? ?Past Medical History:  ?Diagnosis Date  ? Abdominal distention   ? Abdominal pain   ? Bronchitis   ? hx of  ? Cancer [facility])   ? skin  ? CHF (congestive heart failure) ([place])   ?  (hx of EF 27%) s/p AICD 2004. Most recent LVEF > 40% , 8/11  ? Chronic kidney disease   ? has small stones, no treatment  ? Cough   ? Depression   ? takes paxil  ? Diverticulosis   ? hx of  ? Dysrhythmia   ? Easy bruising   ? Hematuria   ? hx of  ? Hernia october 2012  ? LIH  ? HTN (hypertension)   ?  Hyperlipidemia   ? takes zocor  ? Inguinal hernia   ? Left groin pain   ? ? ?Past Surgical History:  ?Procedure Laterality Date  ? ABDOMINAL AORTOGRAM W/LOWER EXTREMITY N/A 09/03/2018  ? Procedure: ABDOMINAL AORTOGRAM W/LOWER EXTREMITY;  Surgeon: Wellington Hampshire, MD;  Location: [place] CV [facility];  Service: Cardiovascular;  Laterality: N/A;  ? ABDOMINAL AORTOGRAM W/LOWER EXTREMITY N/A 07/29/2019  ? Procedure: ABDOMINAL AORTOGRAM W/LOWER EXTREMITY;  Surgeon: Wellington Hampshire, MD;  Location: [place] [facility];  Service: Cardiovascular;  Laterality: N/A;  ? ABDOMINAL AORTOGRAM W/LOWER EXTREMITY Bilateral 03/23/2020  ? Procedure: ABDOMINAL AORTOGRAM W/LOWER EXTREMITY;  Surgeon: Wellington Hampshire, MD;  Location: [place] [facility];  Service: Cardiovascular;  Laterality: Bilateral;  ? ABDOMINAL AORTOGRAM W/LOWER EXTREMITY N/A 11/22/2021  ? Procedure: ABDOMINAL AORTOGRAM W/LOWER EXTREMITY;  Surgeon: Wellington Hampshire, MD;  Location: [place] [facility];  Service: Cardiovascular;  Laterality: N/A;  ? APPENDECTOMY    ? CARDIAC CATHETERIZATION    ? CARDIAC DEFIBRILLATOR REMOVAL  2005  ? HERNIA REPAIR  09/11/11  ? repair of LIH   ? INGUINAL HERNIA REPAIR  09/11/2011  ? Procedure: HERNIA REPAIR INGUINAL ADULT;  Surgeon: Judieth Keens, DO;  Location: [place];  Service: General;  Laterality: Left;  open left inguinal  hernia with mesh  ? LOWER EXTREMITY ANGIOGRAM N/A 02/05/2013  ? Procedure: LOWER EXTREMITY ANGIOGRAM;  Surgeon: Jettie Booze, MD;  Location: [facility] CATH LAB;  Service: Cardiovascular;  Laterality: N/A;  ? PERIPHERAL VASCULAR BALLOON ANGIOPLASTY  07/29/2019  ? Procedure: PERIPHERAL VASCULAR BALLOON ANGIOPLASTY;  Surgeon: Wellington Hampshire, MD;  Location: [place] [facility] LAB;  Service: Cardiovascular;;  ? PERIPHERAL VASCULAR BALLOON ANGIOPLASTY  11/22/2021  ? Procedure: PERIPHERAL VASCULAR BALLOON ANGIOPLASTY;  Surgeon: Wellington Hampshire, MD;  Location: [place] [facility];  Service: Cardiovascular;;  ? PERIPHERAL  VASCULAR INTERVENTION Left 09/03/2018  ? Procedure: PERIPHERAL VASCULAR INTERVENTION;  Surgeon: Wellington Hampshire, MD;  Location: [place] [facility];  Service: Cardiovascular;  Laterality: Left;  ? PERIPHERAL VASCULAR INTERVENTION Right 03/23/2020  ? Procedure: PERIPHERAL VASCULAR INTERVENTION;  Surgeon: Wellington Hampshire, MD;  Location: [place] CV LAB;  Service: Cardiovascular;  Laterality: Right;  ? RIGHT/LEFT HEART CATH AND CORONARY ANGIOGRAPHY N/A 12/15/2019  ? Procedure: RIGHT/LEFT HEART CATH AND CORONARY ANGIOGRAPHY;  Surgeon: [name], MD;  Location: [place] [facility] LAB;  Service: Cardiovascular;  Laterality: N/A;  ? ? ?Current Medications: ?Current Meds  ?Medication Sig  ? amiodarone (PACERONE) 200 MG tablet Take 0.5 tablets (100 mg total) by mouth daily.  ? carvedilol (COREG) 12.5 MG tablet Take 1 tablet (12.5 mg total) by mouth 2 (two) times daily.  ? clopidogrel (PLAVIX) 75 MG tablet Take 1 tablet (75 mg total) by mouth daily with breakfast.  ? diphenhydramine-acetaminophen (TYLENOL PM) 25-500 MG TABS tablet Take 2 tablets by mouth at bedtime.  ? ENTRESTO 97-103 MG TAKE 1 TABLET BY MOUTH TWICE A DAY  ? eplerenone (INSPRA) 25 MG tablet Take 1 tablet (25 mg total) by mouth daily.  ? ezetimibe (ZETIA) 10 MG tablet TAKE 1 TABLET BY MOUTH EVERY DAY  ? JARDIANCE 10 MG TABS tablet TAKE 1 TABLET BY MOUTH DAILY BEFORE BREAKFAST.  ? mirabegron ER (MYRBETRIQ) 50 MG TB24 tablet Take 50 mg by mouth daily.  ? Multiple Vitamins-Minerals (ONE-A-DAY MENS 50+ ADVANTAGE PO) Take 1 tablet by mouth daily.  ? PARoxetine (PAXIL) 40 MG tablet Take 40 mg by mouth daily.  ? rosuvastatin (CRESTOR) 40 MG tablet Take 1 tablet (40 mg total) by mouth daily.  ? sodium chloride (OCEAN) 0.65 % SOLN nasal spray Place 1 spray into both nostrils daily as needed for congestion.  ? venlafaxine XR (EFFEXOR-XR) 75 MG 24 hr capsule Take 75 mg by mouth daily.  ? [DISCONTINUED] carvedilol (COREG) 12.5 MG tablet Take 1.5 tablets (18.75 mg  total) by mouth 2 (two) times daily with a meal.  ? [DISCONTINUED] eplerenone (INSPRA) 50 MG tablet Take 1 tablet (50 mg total) by mouth daily. CALL OFFICE TO ARRANGE FOLLOW UP APPOINTMEMT  ?  ? ?Allergies:   Spironolactone and Sulfa antibiotics  ? ?Social History  ? ?Socioeconomic History  ? Marital status: Married  ?  Spouse name: Not on file  ? Number of children: Not on file  ? Years of education: Not on file  ? Highest education level: Not on file  ?Occupational History  ? Not on file  ?Tobacco Use  ? Smoking status: Every Day  ?  Packs/day: 1.00  ?  Years: 48.00  ?  Pack years: 48.00  ?  Types: Cigars, Cigarettes  ? Smokeless tobacco: Never  ?Substance and Sexual Activity  ? Alcohol use: Yes  ?  Alcohol/week: 7.0 standard drinks  ?  Types: 7 drink(s) per week  ?  Drug use: No  ? Sexual activity: Not on file  ?Other Topics Concern  ? Not on file  ?Social History Narrative  ? Not on file  ? ?Social Determinants of Health  ? ?Financial Resource Strain: Not on file  ?Food Insecurity: Not on file  ?Transportation Needs: Not on file  ?Physical Activity: Not on file  ?Stress: Not on file  ?Social Connections: Not on file  ?  ? ?Family History: ?The patient's family history includes Cancer in his mother. ? ?ROS:   ?Please see the history of present illness.    ?Having claudication but somewhat improved after recent vascular procedure by Dr. Jacqulyn Cane.  all other systems reviewed and are negative. ? ?EKGs/Labs/Other Studies Reviewed:   ? ?The following studies were reviewed today: ?2D Doppler echocardiogram/15/2022: ?IMPRESSIONS  ? ? ? 1. Left ventricular ejection fraction, by estimation, is 25 to 30%. The  ?left ventricle has severely decreased function. The left ventricle  ?demonstrates global hypokinesis. The left ventricular internal cavity size  ?was mildly dilated. Left ventricular  ?diastolic parameters are consistent with Grade I diastolic dysfunction  ?(impaired relaxation).  ? 2. Right ventricular  systolic function is normal. The right ventricular  ?size is normal.  ? 3. The mitral valve is normal in structure. No evidence of mitral valve  ?regurgitation.  ? 4. The aortic valve is tricuspid. There is mild cal

## 2022-02-26 ENCOUNTER — Encounter: Payer: Self-pay | Admitting: Interventional Cardiology

## 2022-02-26 ENCOUNTER — Ambulatory Visit (INDEPENDENT_AMBULATORY_CARE_PROVIDER_SITE_OTHER): Payer: Medicare Other | Admitting: Interventional Cardiology

## 2022-02-26 VITALS — BP 120/78 | HR 58 | Ht 70.0 in | Wt 146.4 lb

## 2022-02-26 DIAGNOSIS — Z720 Tobacco use: Secondary | ICD-10-CM

## 2022-02-26 DIAGNOSIS — I2510 Atherosclerotic heart disease of native coronary artery without angina pectoris: Secondary | ICD-10-CM | POA: Diagnosis not present

## 2022-02-26 DIAGNOSIS — I5022 Chronic systolic (congestive) heart failure: Secondary | ICD-10-CM | POA: Diagnosis not present

## 2022-02-26 DIAGNOSIS — I739 Peripheral vascular disease, unspecified: Secondary | ICD-10-CM

## 2022-02-26 DIAGNOSIS — I10 Essential (primary) hypertension: Secondary | ICD-10-CM

## 2022-02-26 DIAGNOSIS — E785 Hyperlipidemia, unspecified: Secondary | ICD-10-CM

## 2022-02-26 DIAGNOSIS — Z79899 Other long term (current) drug therapy: Secondary | ICD-10-CM | POA: Diagnosis not present

## 2022-02-26 MED ORDER — CARVEDILOL 12.5 MG PO TABS: 12.5000 mg | ORAL_TABLET | Freq: Two times a day (BID) | ORAL | 3 refills | Status: DC

## 2022-02-26 MED ORDER — EPLERENONE 25 MG PO TABS: 25.0000 mg | ORAL_TABLET | Freq: Every day | ORAL | 3 refills | Status: DC

## 2022-02-26 NOTE — Patient Instructions (Addendum)
Medication Instructions:  ?Your physician has recommended you make the following change in your medication:  ? ?1) DECREASE Carvedilol to 12.'5mg'$  twice daily ?2) DECREASE Inspra to '25mg'$  once daily (if you can cut the '50mg'$  tablet in half do that and take it once a day, if you cannot cut the tablet in half then take 1 tablet on Mondays, Wednesdays, Fridays) ? ?*If you need a refill on your cardiac medications before your next appointment, please call your pharmacy* ? ? ?Lab Work: ?NONE ? ?Testing/Procedures: ?Your physician has requested that you have an echocardiogram 1-2 weeks prior to next visit (February 2024). Echocardiography is a painless test that uses sound waves to create images of your heart. It provides your doctor with information about the size and shape of your heart and how well your heart?s chambers and valves are working. This procedure takes approximately one hour. There are no restrictions for this procedure.  ? ?Follow-Up: ?At [facility], you and your health needs are our priority.  As part of our continuing mission to provide you with exceptional heart care, we have created designated Provider Care Teams.  These Care Teams include your primary Cardiologist (physician) and Advanced Practice Providers (APPs -  Physician Assistants and Nurse Practitioners) who all work together to provide you with the care you need, when you need it. ? ?Your next appointment:   ?9 month(s) ? ?The format for your next appointment:   ?In Person ? ?Provider:   ?Sinclair Grooms, MD { ? ? ?Important Information About Sugar ? ? ? ? ?  ?

## 2022-03-01 ENCOUNTER — Encounter (HOSPITAL_COMMUNITY): Payer: Self-pay | Admitting: Cardiology

## 2022-03-01 ENCOUNTER — Ambulatory Visit (HOSPITAL_COMMUNITY)
Admission: RE | Admit: 2022-03-01 | Discharge: 2022-03-01 | Disposition: A | Discharge status: Home / Self Care | Payer: Medicare Other | Source: Ambulatory Visit | Attending: Cardiology | Admitting: Cardiology

## 2022-03-01 VITALS — BP 140/88 | HR 58 | Wt 147.2 lb

## 2022-03-01 DIAGNOSIS — G4710 Hypersomnia, unspecified: Secondary | ICD-10-CM | POA: Insufficient documentation

## 2022-03-01 DIAGNOSIS — F17210 Nicotine dependence, cigarettes, uncomplicated: Secondary | ICD-10-CM | POA: Insufficient documentation

## 2022-03-01 DIAGNOSIS — E782 Mixed hyperlipidemia: Secondary | ICD-10-CM | POA: Diagnosis not present

## 2022-03-01 DIAGNOSIS — I2510 Atherosclerotic heart disease of native coronary artery without angina pectoris: Secondary | ICD-10-CM | POA: Insufficient documentation

## 2022-03-01 DIAGNOSIS — I130 Hypertensive heart and chronic kidney disease with heart failure and stage 1 through stage 4 chronic kidney disease, or unspecified chronic kidney disease: Secondary | ICD-10-CM | POA: Insufficient documentation

## 2022-03-01 DIAGNOSIS — Z79899 Other long term (current) drug therapy: Secondary | ICD-10-CM | POA: Diagnosis not present

## 2022-03-01 DIAGNOSIS — I428 Other cardiomyopathies: Secondary | ICD-10-CM | POA: Diagnosis not present

## 2022-03-01 DIAGNOSIS — I5042 Chronic combined systolic (congestive) and diastolic (congestive) heart failure: Secondary | ICD-10-CM | POA: Insufficient documentation

## 2022-03-01 DIAGNOSIS — N183 Chronic kidney disease, stage 3 unspecified: Secondary | ICD-10-CM | POA: Insufficient documentation

## 2022-03-01 DIAGNOSIS — I493 Ventricular premature depolarization: Secondary | ICD-10-CM | POA: Diagnosis not present

## 2022-03-01 DIAGNOSIS — Z7902 Long term (current) use of antithrombotics/antiplatelets: Secondary | ICD-10-CM | POA: Diagnosis not present

## 2022-03-01 DIAGNOSIS — E1122 Type 2 diabetes mellitus with diabetic chronic kidney disease: Secondary | ICD-10-CM | POA: Diagnosis not present

## 2022-03-01 DIAGNOSIS — R0683 Snoring: Secondary | ICD-10-CM | POA: Diagnosis not present

## 2022-03-01 DIAGNOSIS — Z7984 Long term (current) use of oral hypoglycemic drugs: Secondary | ICD-10-CM | POA: Diagnosis not present

## 2022-03-01 DIAGNOSIS — Z95810 Presence of automatic (implantable) cardiac defibrillator: Secondary | ICD-10-CM | POA: Insufficient documentation

## 2022-03-01 DIAGNOSIS — E1151 Type 2 diabetes mellitus with diabetic peripheral angiopathy without gangrene: Secondary | ICD-10-CM | POA: Insufficient documentation

## 2022-03-01 LAB — LIPID PANEL
Cholesterol: 119 mg/dL (ref 0–200)
HDL: 56 mg/dL (ref >40)
LDL Cholesterol: 50 mg/dL (ref 0–99)
Total CHOL/HDL Ratio: 2.1 RATIO
Triglycerides: 64 mg/dL (ref <150)
VLDL: 13 mg/dL (ref 0–40)

## 2022-03-01 LAB — TSH: TSH: 0.256 u[IU]/mL — ABNORMAL LOW (ref 0.350–4.500)

## 2022-03-01 LAB — COMPREHENSIVE METABOLIC PANEL
ALT: 37 U/L (ref 0–44)
AST: 41 U/L (ref 15–41)
Albumin: 3.4 g/dL — ABNORMAL LOW (ref 3.5–5.0)
Alkaline Phosphatase: 79 U/L (ref 38–126)
Anion gap: 9 (ref 5–15)
BUN: 16 mg/dL (ref 8–23)
CO2: 26 mmol/L (ref 22–32)
Calcium: 9 mg/dL (ref 8.9–10.3)
Chloride: 105 mmol/L (ref 98–111)
Creatinine, Ser: 1.06 mg/dL (ref 0.61–1.24)
GFR, Estimated: >60 mL/min (ref >60)
Glucose, Bld: 132 mg/dL — ABNORMAL HIGH (ref 70–99)
Potassium: 3.7 mmol/L (ref 3.5–5.1)
Sodium: 140 mmol/L (ref 135–145)
Total Bilirubin: 0.5 mg/dL (ref 0.3–1.2)
Total Protein: 5.9 g/dL — ABNORMAL LOW (ref 6.5–8.1)

## 2022-03-01 MED ORDER — EPLERENONE 50 MG PO TABS: 50.0000 mg | ORAL_TABLET | Freq: Every day | ORAL | 3 refills | Status: DC

## 2022-03-01 NOTE — Patient Instructions (Signed)
Medication Changes: ? ?Increase Eplerenone to 50 mg daily ? ?Lab Work: ? ?Labs done today, your results will be available in MyChart, we will contact you for abnormal readings. ? ? ?Testing/Procedures: ? ?Your provider has recommended that you have a home sleep study.  We have provided you with the equipment in our office today. Please download the app and follow the instructions. YOUR PIN NUMBER IS: [id number]. Once you have completed the test you just dispose of the equipment, the information is automatically uploaded to [place] via blue-tooth technology. If your test is positive for sleep apnea and you need a home CPAP machine you will be contacted by Dr Theodosia Blender office [facility]) to set this up. ? ?Your physician has requested that you have an echocardiogram. Echocardiography is a painless test that uses sound waves to create images of your heart. It provides your doctor with information about the size and shape of your heart and how well your heart?s chambers and valves are working. This procedure takes approximately one hour. There are no restrictions for this procedure. ? ? ?Referrals: ? ?none ? ?Special Instructions // Education: ? ?none ? ?Follow-Up in: 4 months with echocardiogram  ? ?At the [facility], you and your health needs are our priority. We have a designated team specialized in the treatment of Heart Failure. This Care Team includes your primary Heart Failure Specialized Cardiologist (physician), Advanced Practice Providers (APPs- Physician Assistants and Nurse Practitioners), and Pharmacist who all work together to provide you with the care you need, when you need it.  ? ?You may see any of the following providers on your designated Care Team at your next follow up: ? ?Dr Glori Bickers ?Dr Loralie Champagne ?Darrick Grinder, NP ?Lyda Jester, PA ?Jessica Milford,NP ?Marlyce Huge, PA ?Audry Riles, PharmD ? ? ?Please be sure to bring in all your medications bottles to every  appointment.  ? ?Need to Contact us: ? ?If you have any questions or concerns before your next appointment please send [place] a message through [place] or call our office at [phone].   ? ?TO LEAVE A MESSAGE FOR THE NURSE SELECT OPTION 2, PLEASE LEAVE A MESSAGE INCLUDING: ?YOUR NAME ?DATE OF BIRTH ?CALL BACK NUMBER ?REASON FOR CALL**this is important as we prioritize the call backs ? ?YOU WILL RECEIVE A CALL BACK THE SAME DAY AS LONG AS YOU CALL BEFORE 4:00 PM ? ? ?

## 2022-03-02 ENCOUNTER — Other Ambulatory Visit (HOSPITAL_COMMUNITY): Payer: Self-pay | Admitting: Cardiology

## 2022-03-04 NOTE — Progress Notes (Signed)
PCP: Jonathon Jordan, MD ?Cardiology: Dr. Tamala Julian ?HF Cardiology: Dr. Aundra Dubin ? ?81 y.o. with PAD, smoking, and a long history of cardiomyopathy was referred by Dr. Tamala Julian for evaluation of CHF/cardiomyopathy.  Patient was diagnosed with a cardiomyopathy prior to 2004 while he was living in [place].  He had an ICD placed in 2004.  Per notes, EF was < 30% during this time.  He thinks he may have had a heart catheterization but does not remember the details.  The ICD subsequently malfunctioned and was turned off in 2009.  It is still implanted and has 2 nonfunctioning leads.  Echo in 2011 after moving the [facility] showed improved EF at 40-45%.  He did not have an echo over the next 9 years.  However, Echo done in 7/20 showed EF down to 20-25% with a moderately dilated LV.  He has been subsequently started on HF meds.  He saw Dr. Lovena Le to decide about explantation of old ICD/implantation of new ICD.  He was thought to be not a candidate for a new intravascular ICD.  Subcutaneous ICD would be a potential option but utility would like be limited with nonischemic cardiomyopathy at age 29.  ? ?Patient also has an extensive PAD history.  In 9/20 he had PTCA to left external and common iliac arteries.   ? ?LHC/RHC in 2/21 showed nonobstructive CAD, normal filling pressures, CI 2.4.  He was well-compensated.  ? ?Peripheral angiography in 5/21 with stent to the right external iliac artery.  ? ?He had presumed diverticular bleeding in 10/21.  ? ?Holter in 3/22 with very frequent PVCs, 21% total beats.  Amiodarone was started.  Repeat Zio monitor in 7/22 showed PVCs down to 1.8%.  ? ?Echo in 4/22 showed EF 25-30%, mild LV enlargement, normal RV.   ? ?In 1/23, he had balloon angioplasty of right CIA.  ? ?He returns today for followup of CHF.  Still smoking.  Right calf claudication has improved dramatically since his PTCA.  He reported orthostatic symptoms to Dr. Tamala Julian, and on Monday his Coreg and eplerenone were both decreased.   Symptoms seem better.  No syncope/presyncope.  He has generalized fatigue and snores at night.  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  No chest pain.  No dyspnea walking his dog.   ? ?Labs (2/20): LDL 93 ?Labs (11/20): K 4, creatinine 0.99  ?Labs (1/21): LDL 89 ?Labs (2/21): K 4.2, creatinine 0.97 ?Labs (4/21): LDL 51, HDL 50, K 3.7, creatinine 0.92 ?Labs (6/21): K 4.5, creatinine 0.86 ?Labs (10/21): K 4.3, creatinine 1.3, hgb 14.4 ?Labs (3/22): LDL 50 ?Labs (4/22): K 4.1, creatinine 1.08 ?Labs (7/22): K 4.4, creatinine 1.13, hgb 15.2, TSH normal, LFTs normal ?Labs (1/23): K 4.7, creatinine 1.17 ? ?PMH:  ?1. HTN ?2. Hyperlipidemia ?3. Active smoker ?4. PAD: Right popliteal stent in 2014 with subsequent occlusion.   ?- Orbital atherectomy + drug coated balloon angioplasty of left SFA in 2019.   ?- In 9/20, had PTCA left external and common iliac arteries.  ?- Peripheral angiogram in 5/21 with stent to right external iliac artery.  ?- 1/22 peripheral arterial dopplers with significant bilateral iliac disease.  ?- 7/22 peripheral arterial dopplers with moderate stenosis right EIA stent.  ?- 1/23 PTCA to right CIA.  ?5. CKD: Stage 3.  ?6. Cardiomyopathy: Patient had an ICD placed in [place] in 2004, so presumably had a pre-dating cardiomyopathy.  He thinks that he had a heart cath in [place] but is not sure.  EF <30% when he  moved to [place].  ?- Echo (2011) with EF improved to 40-45%.  ?- Echo (7/20): EF 20-25%, moderate LV dilation, normal RV size and systolic function.  ?- Patient has 2 nonfunctioning ICD leads (ICD is not functional, turned off in 2009).  ?- LHC/RHC (2/21): Nonobstructive CAD.  Mean RA 3, PA 25/5, mean PCWP 7, CI 2.4.  ?- Echo (4/22): EF 25-30%, mild LV enlargement, normal RV. ?7. Type 2 diabetes ?8. Diverticular bleeding in 10/21.  ?9. PVCs: Zio patch 3/22 showed 21% PVCs with 15 short NSVT runs.  ?- Zio monitor (7/22): 1.8% PVCs on amiodarone.  ? ?SH: Retired 15 years ago from [employer].  Married with 3 children, smokes about 1/2-1 ppd still, has had about 3 drinks/day for 2-3 years.  ? ?FH: Mother with cancer, father with COPD.  No heart disease that he knows of.  ? ?ROS: All systems reviewed and negative except as per HPI.  ? ?Current Outpatient Medications  ?Medication Sig Dispense Refill  ? amiodarone (PACERONE) 200 MG tablet Take 0.5 tablets (100 mg total) by mouth daily. 45 tablet 3  ? carvedilol (COREG) 12.5 MG tablet Take 1 tablet (12.5 mg total) by mouth 2 (two) times daily. 180 tablet 3  ? clopidogrel (PLAVIX) 75 MG tablet Take 1 tablet (75 mg total) by mouth daily with breakfast. 30 tablet 11  ? diphenhydramine-acetaminophen (TYLENOL PM) 25-500 MG TABS tablet Take 2 tablets by mouth at bedtime.    ? ENTRESTO 97-103 MG TAKE 1 TABLET BY MOUTH TWICE A DAY 60 tablet 11  ? ezetimibe (ZETIA) 10 MG tablet TAKE 1 TABLET BY MOUTH EVERY DAY 90 tablet 3  ? JARDIANCE 10 MG TABS tablet TAKE 1 TABLET BY MOUTH DAILY BEFORE BREAKFAST. 30 tablet 6  ? mirabegron ER (MYRBETRIQ) 50 MG TB24 tablet Take 50 mg by mouth daily.    ? Multiple Vitamins-Minerals (ONE-A-DAY MENS 50+ ADVANTAGE PO) Take 1 tablet by mouth daily.    ? PARoxetine (PAXIL) 40 MG tablet Take 40 mg by mouth daily.    ? rosuvastatin (CRESTOR) 40 MG tablet Take 1 tablet (40 mg total) by mouth daily. 90 tablet 3  ? sodium chloride (OCEAN) 0.65 % SOLN nasal spray Place 1 spray into both nostrils daily as needed for congestion.    ? venlafaxine XR (EFFEXOR-XR) 75 MG 24 hr capsule Take 75 mg by mouth daily.    ? eplerenone (INSPRA) 50 MG tablet Take 1 tablet (50 mg total) by mouth daily. 90 tablet 3  ? ?No current facility-administered medications for this encounter.  ? ?BP 140/88   Pulse (!) 58   Wt 66.8 kg (147 lb 3.2 oz)   SpO2 100%   BMI 21.12 kg/m?  ?General: NAD ?Neck: No JVD, no thyromegaly or thyroid nodule.  ?Lungs: Clear to auscultation bilaterally with normal respiratory effort. ?CV: Nondisplaced PMI.  Heart regular S1/S2,  no S3/S4, no murmur.  No peripheral edema.  No carotid bruit.  Difficult to palpate pedal pulses.  ?Abdomen: Soft, nontender, no hepatosplenomegaly, no distention.  ?Skin: Intact without lesions or rashes.  ?Neurologic: Alert and oriented x 3.  ?Psych: Normal affect. ?Extremities: No clubbing or cyanosis.  ?HEENT: Normal.  ? ?Assessment/Plan: ?1. Chronic systolic CHF:  Patient has had a known nonischemic cardiomyopathy since prior to 2004.  EF had improved to 40-45% in 2011 but had dropped significantly on echo in 7/20 with EF 20-25%. He has a nonfunctioning ICD.  Cause of the cardiomyopathy is uncertain.  Coronary angiography  in 2/21 showed no significant coronary disease. He has no family history of cardiomyopathy.  RHC in 2/21 showed normal filling pressures and normal cardiac output.  Echo in 4/22 with EF still low at 25-30%.  PVCs contribute to his cardiomyopathy, Zio patch in 3/22 with 21% PVCs.  NYHA class I-II symptoms, not volume overloaded on exam with stable weight.  ?- Continue lower dose of Coreg, 12.5 mg bid, due to orthostatic symptoms.  ?- I will have him increase the eplerenone back to 50 mg daily but take in the evening before bed.  BMET today.  ?- Continue Entresto 97/103 bid.   ?- Continue Jardiance 10 mg daily.  ?- He does not appear to need Lasix.  ?- Patient is not a candidate for another intravascular ICD.  Limited utility to implanting subcutaneous ICD in the setting of a nonischemic cardiomyopathy.   ?- Echo at followup appt.  ?2. PAD: Significant improvement since R CIA PTCA in 1/23.  Unfortunately, still smoking.      ?- Stay active.  ?- Continue Plavix 75 and Crestor 40 mg daily + Zetia 10 mg daily.  ?- Needs to quit smoking => he has been unable to do this.  ?3. HTN: BP mildly elevated but has had orthostatic symptoms.  ?4. Hyperlipidemia: Check lipids today.  ?5. PVCs: Frequent in past, may contribute to cardiomyopathy.  7/22 Zio monitor on amiodarone showed PVC percentage down to  1.8%.  ?- Continue amiodarone 100 mg daily.  Check LFTs/TSH today and will need regular eye exam.  ?6. Suspect OSA: Daytime sleepiness and snoring.  Will order home sleep study.  ? ?Followup in 4 months with echo.  ?

## 2022-03-09 ENCOUNTER — Other Ambulatory Visit: Payer: Self-pay

## 2022-03-09 ENCOUNTER — Telehealth (HOSPITAL_COMMUNITY): Payer: Self-pay | Admitting: Surgery

## 2022-03-09 ENCOUNTER — Telehealth (HOSPITAL_COMMUNITY): Payer: Self-pay

## 2022-03-09 DIAGNOSIS — Z79899 Other long term (current) drug therapy: Secondary | ICD-10-CM

## 2022-03-09 DIAGNOSIS — I5042 Chronic combined systolic (congestive) and diastolic (congestive) heart failure: Secondary | ICD-10-CM | POA: Diagnosis not present

## 2022-03-09 DIAGNOSIS — E782 Mixed hyperlipidemia: Secondary | ICD-10-CM

## 2022-03-09 DIAGNOSIS — I5022 Chronic systolic (congestive) heart failure: Secondary | ICD-10-CM

## 2022-03-09 LAB — TSH: TSH: 0.234 u[IU]/mL — ABNORMAL LOW (ref 0.450–4.500)

## 2022-03-09 NOTE — Telephone Encounter (Signed)
-----   Message from Larey Dresser, MD sent at 03/01/2022  1:12 PM EDT ----- ?Low TSH, forward lab to his endocrinologist (has history of hyperthyroidism).  He needs to have free T3 and free T4 drawn.  ?

## 2022-03-09 NOTE — Telephone Encounter (Signed)
I called patient called in reference to ordered home sleep study. I received a message that he attempted to complete the study and it was unsuccessful secondary to not enough sleep time. I left a message requesting that he call back.  I would like for him to attempt to complete a new study with new device. ?

## 2022-03-09 NOTE — Telephone Encounter (Signed)
Patient advised and verbalized understanding. Patient does not have a endocrinologist at this time, patient will have repeat blood work done in [place] at [facility] Lab order entered.  Do you want to refer him to a endocrinologist?  ? ?Orders Placed This Encounter  ?Procedures  ? T4, free  ?  Standing Status:   Future  ?  Standing Expiration Date:   03/10/2023  ?  Order Specific Question:   Release to patient  ?  Answer:   Immediate  ? T3, free  ?  Standing Status:   Future  ?  Standing Expiration Date:   03/10/2023  ?  Order Specific Question:   Release to patient  ?  Answer:   Immediate  ? ? ? ? ?

## 2022-03-13 ENCOUNTER — Telehealth (HOSPITAL_COMMUNITY): Payer: Self-pay | Admitting: Surgery

## 2022-03-13 NOTE — Telephone Encounter (Signed)
Patient contacted regarding failed ordered home sleep study.  He will return to clinic to pick up a new device and attempt to complete the study again. ?

## 2022-03-16 ENCOUNTER — Telehealth (HOSPITAL_COMMUNITY): Payer: Self-pay | Admitting: *Deleted

## 2022-03-16 DIAGNOSIS — I5022 Chronic systolic (congestive) heart failure: Secondary | ICD-10-CM

## 2022-03-16 DIAGNOSIS — R7989 Other specified abnormal findings of blood chemistry: Secondary | ICD-10-CM

## 2022-03-16 NOTE — Telephone Encounter (Signed)
Called patient per Dr. Aundra Dubin and informed him based on recent abnormal THS (thyroid test) a referral has been sent to:   [facility]  [address] # [place], [place] [address]  [phone]  Asked patient to call [facility] if he doesn't hear from them by end of next week. Also confirmed patient has additional thyroid studies scheduled at [facility].  Asked patient to call us if any questions or concerns with verbalized understanding of above.  Zada Girt RN, [facility] [phone]

## 2022-04-19 DIAGNOSIS — L570 Actinic keratosis: Secondary | ICD-10-CM | POA: Diagnosis not present

## 2022-04-19 DIAGNOSIS — D485 Neoplasm of uncertain behavior of skin: Secondary | ICD-10-CM | POA: Diagnosis not present

## 2022-04-19 DIAGNOSIS — D692 Other nonthrombocytopenic purpura: Secondary | ICD-10-CM | POA: Diagnosis not present

## 2022-04-19 DIAGNOSIS — C44329 Squamous cell carcinoma of skin of other parts of face: Secondary | ICD-10-CM | POA: Diagnosis not present

## 2022-04-19 DIAGNOSIS — Z85828 Personal history of other malignant neoplasm of skin: Secondary | ICD-10-CM | POA: Diagnosis not present

## 2022-04-19 DIAGNOSIS — C44619 Basal cell carcinoma of skin of left upper limb, including shoulder: Secondary | ICD-10-CM | POA: Diagnosis not present

## 2022-04-19 DIAGNOSIS — L821 Other seborrheic keratosis: Secondary | ICD-10-CM | POA: Diagnosis not present

## 2022-05-04 DIAGNOSIS — E0590 Thyrotoxicosis, unspecified without thyrotoxic crisis or storm: Secondary | ICD-10-CM | POA: Diagnosis not present

## 2022-05-21 ENCOUNTER — Other Ambulatory Visit (HOSPITAL_COMMUNITY): Payer: Self-pay | Admitting: Cardiology

## 2022-06-13 ENCOUNTER — Other Ambulatory Visit (HOSPITAL_COMMUNITY): Payer: Self-pay | Admitting: Cardiology

## 2022-06-21 DIAGNOSIS — E0590 Thyrotoxicosis, unspecified without thyrotoxic crisis or storm: Secondary | ICD-10-CM | POA: Diagnosis not present

## 2022-06-28 IMAGING — CT CT ABD-PELV W/ CM
2 of 5 series · 16 of 46 positions shown, 18 images · IV contrast (omnipaque)
Comparison: 12/06/2016

CLINICAL DATA: Bright red blood per rectum

EXAM:
CT ABDOMEN AND PELVIS WITH CONTRAST
TECHNIQUE: Multidetector CT imaging of the abdomen and pelvis was performed
using the standard protocol following bolus administration of
intravenous contrast.
CONTRAST:  100mL OMNIPAQUE IOHEXOL 300 MG/ML  SOLN

[Series 3: a/p w/ 5mm · axial · 0.71mm/px · z∈[+744,+1134]mm · 13 of 88 slices shown, 15 images]
[im 5/88  soft-tissue]
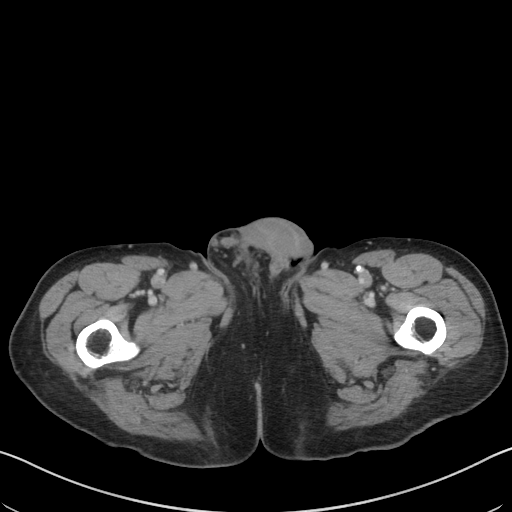
[im 5/88  bone]
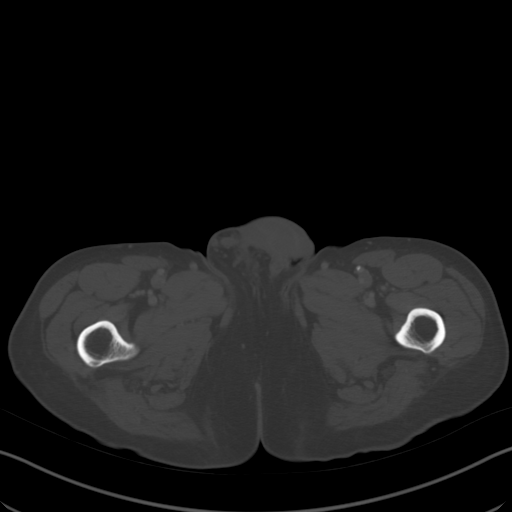
[im 10/88  soft-tissue]
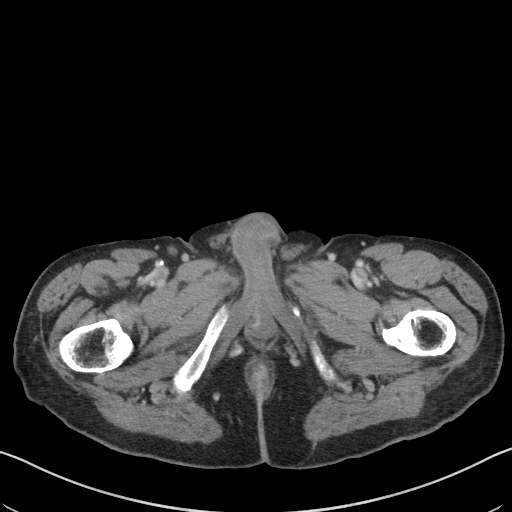
[im 20/88  soft-tissue]
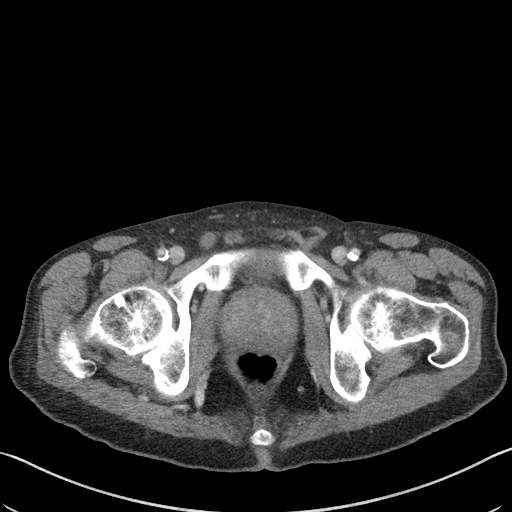
[im 25/88  soft-tissue]
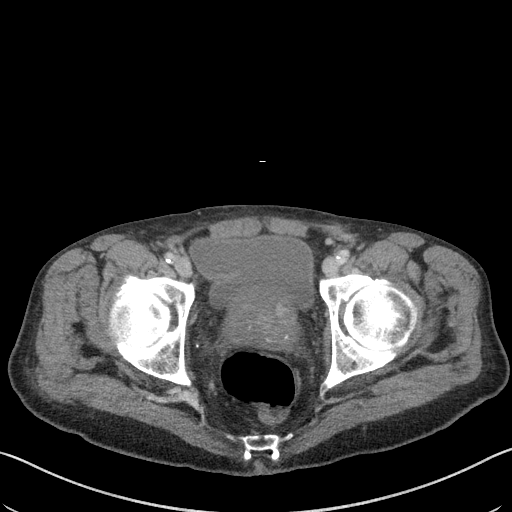
[im 30/88  soft-tissue]
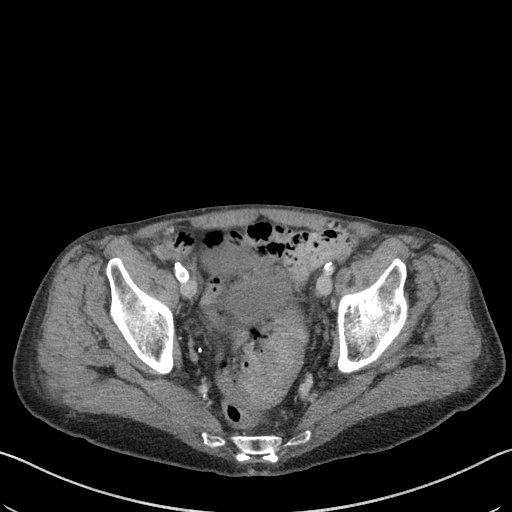
[im 39/88  soft-tissue]
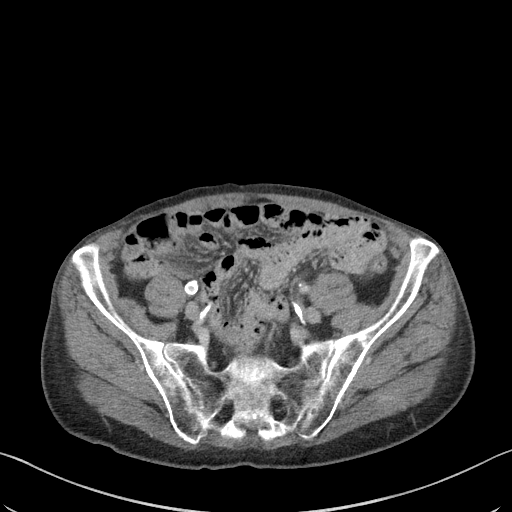
[im 44/88  soft-tissue]
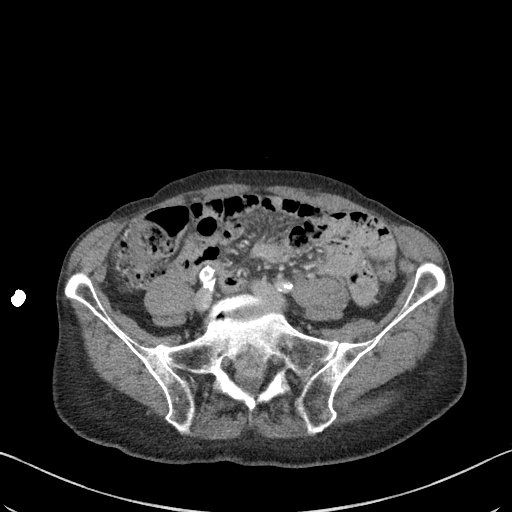
[im 49/88  soft-tissue]
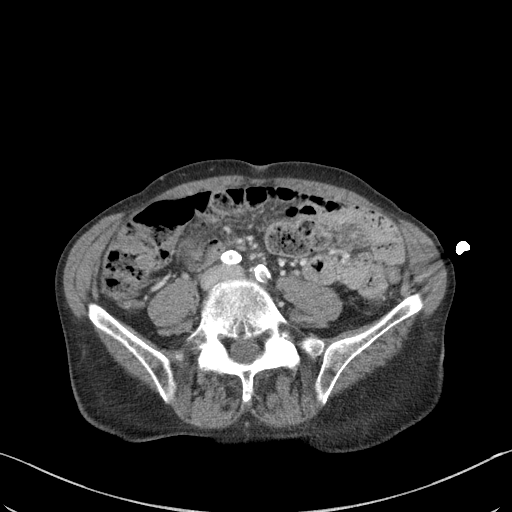
[im 59/88  soft-tissue]
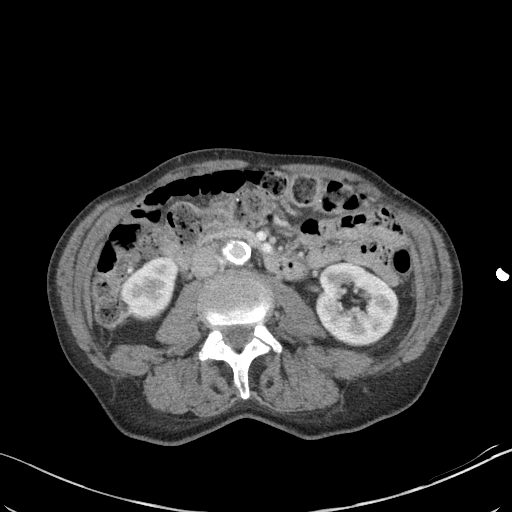
[im 59/88  bone]
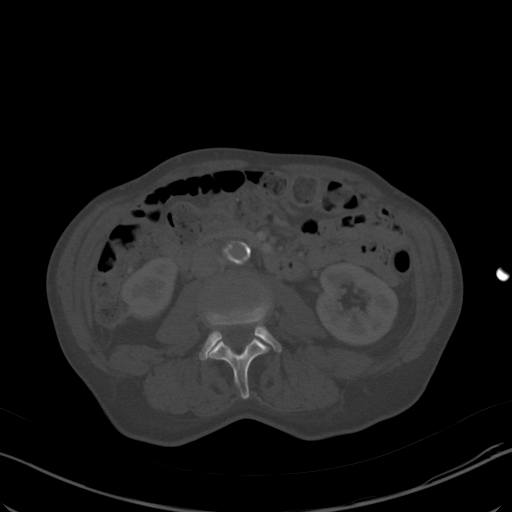
[im 63/88  soft-tissue]
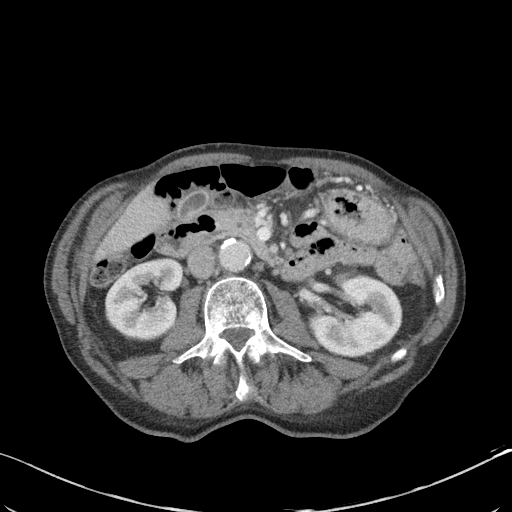
[im 68/88  soft-tissue]
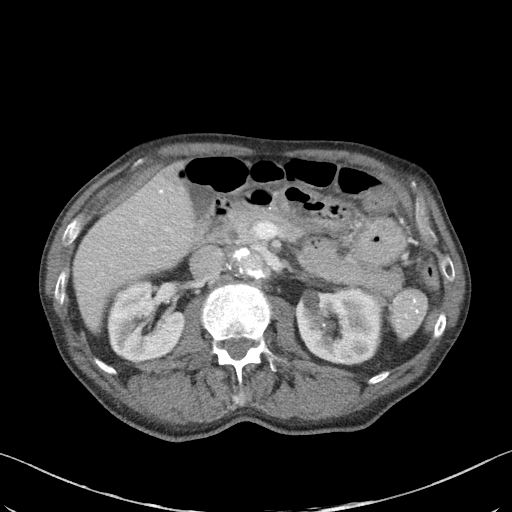
[im 78/88  soft-tissue]
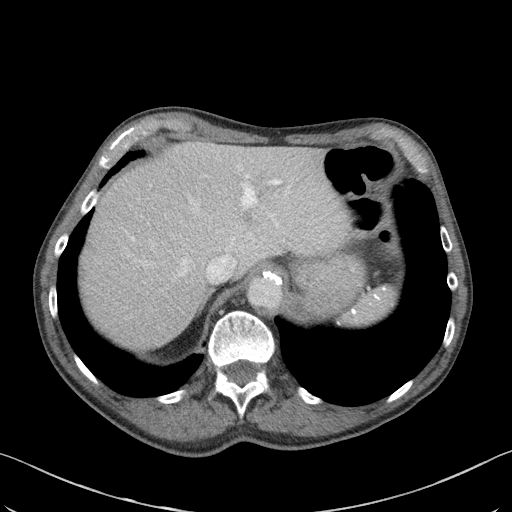
[im 83/88  soft-tissue]
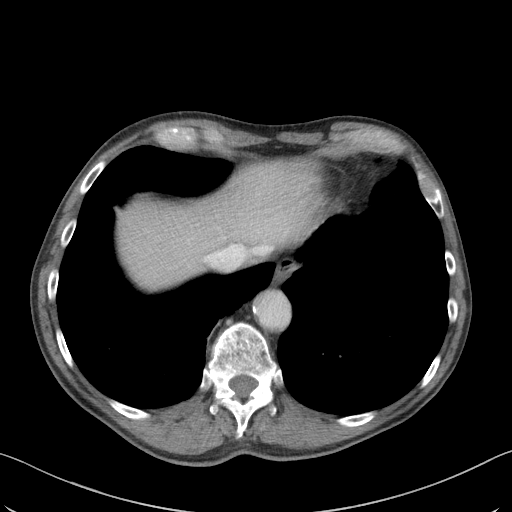

[Series 6: a/p w/ cor · coronal · 0.77mm/px · 3 of 158 slices shown]
[im 53/158  soft-tissue]
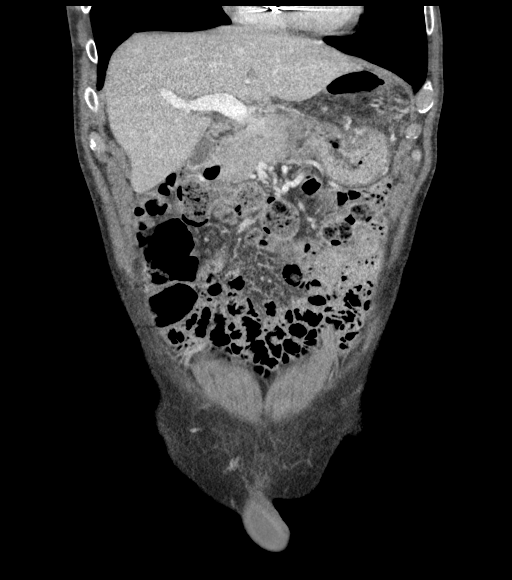
[im 70/158  soft-tissue]
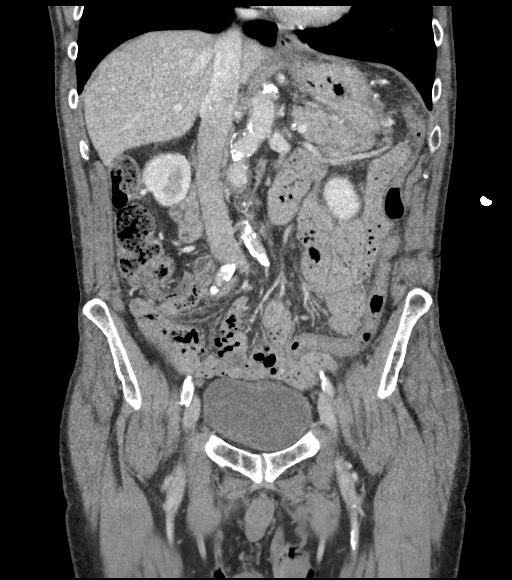
[im 88/158  soft-tissue]
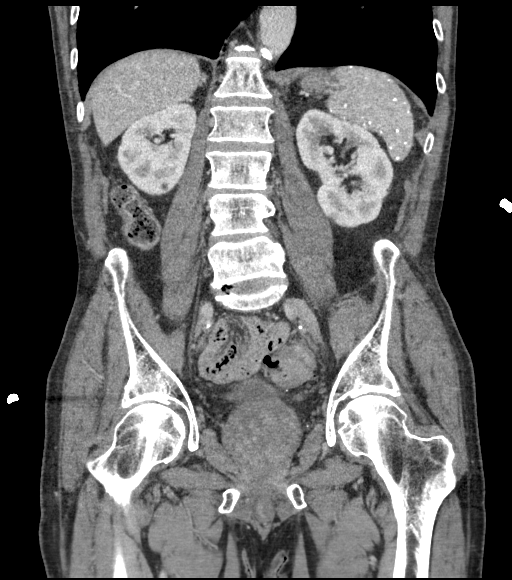

[16 of 46 positions shown; findings below may reference images not displayed]

FINDINGS: Lower chest: No acute abnormality.

Hepatobiliary: Fatty infiltration of the liver is noted. Scattered
calcified granulomas are noted. The gallbladder is within normal
limits.

Pancreas: Unremarkable. No pancreatic ductal dilatation or
surrounding inflammatory changes.

Spleen: Multiple calcified granulomas are noted.

Adrenals/Urinary Tract: Adrenal glands are within normal limits.
Kidneys demonstrate a normal enhancement pattern. Scattered cysts
are noted within the left kidney. The largest of these lies in the
lower pole measuring 15 mm. The bladder is partially distended. No
obstructive changes are seen.

Stomach/Bowel: Appendix is not well visualized. Scattered
diverticular changes noted without diverticulitis. No inflammatory
changes to suggest appendicitis are noted. No obstructive or
inflammatory changes of the colon are seen. The stomach and small
bowel appear within normal limits.

Vascular/Lymphatic: Diffuse atherosclerotic calcifications are noted
without aneurysmal dilatation. No sizable adenopathy is noted.

Reproductive: Prostate is unremarkable.

Other: No abdominal wall hernia or abnormality. No abdominopelvic
ascites.

Musculoskeletal: No acute or significant osseous findings.
IMPRESSION: Changes consistent with prior granulomatous disease.

No acute abnormality noted.

Chronic changes as described above.

## 2022-07-04 ENCOUNTER — Encounter (HOSPITAL_COMMUNITY): Payer: Self-pay | Admitting: Cardiology

## 2022-07-04 ENCOUNTER — Ambulatory Visit (HOSPITAL_BASED_OUTPATIENT_CLINIC_OR_DEPARTMENT_OTHER)
Admission: RE | Admit: 2022-07-04 | Discharge: 2022-07-04 | Disposition: A | Discharge status: Home / Self Care | Payer: Medicare Other | Source: Ambulatory Visit | Attending: Cardiology | Admitting: Cardiology

## 2022-07-04 ENCOUNTER — Ambulatory Visit (HOSPITAL_COMMUNITY)
Admission: RE | Admit: 2022-07-04 | Discharge: 2022-07-04 | Disposition: A | Discharge status: Home / Self Care | Payer: Medicare Other | Source: Ambulatory Visit | Attending: Cardiology | Admitting: Cardiology

## 2022-07-04 VITALS — BP 150/80 | HR 52 | Wt 145.4 lb

## 2022-07-04 DIAGNOSIS — I493 Ventricular premature depolarization: Secondary | ICD-10-CM | POA: Diagnosis not present

## 2022-07-04 DIAGNOSIS — I2510 Atherosclerotic heart disease of native coronary artery without angina pectoris: Secondary | ICD-10-CM

## 2022-07-04 DIAGNOSIS — I5042 Chronic combined systolic (congestive) and diastolic (congestive) heart failure: Secondary | ICD-10-CM

## 2022-07-04 DIAGNOSIS — I739 Peripheral vascular disease, unspecified: Secondary | ICD-10-CM | POA: Diagnosis not present

## 2022-07-04 DIAGNOSIS — I130 Hypertensive heart and chronic kidney disease with heart failure and stage 1 through stage 4 chronic kidney disease, or unspecified chronic kidney disease: Secondary | ICD-10-CM | POA: Insufficient documentation

## 2022-07-04 DIAGNOSIS — R7989 Other specified abnormal findings of blood chemistry: Secondary | ICD-10-CM | POA: Diagnosis not present

## 2022-07-04 DIAGNOSIS — E785 Hyperlipidemia, unspecified: Secondary | ICD-10-CM | POA: Insufficient documentation

## 2022-07-04 DIAGNOSIS — E1122 Type 2 diabetes mellitus with diabetic chronic kidney disease: Secondary | ICD-10-CM | POA: Insufficient documentation

## 2022-07-04 DIAGNOSIS — E0590 Thyrotoxicosis, unspecified without thyrotoxic crisis or storm: Secondary | ICD-10-CM | POA: Diagnosis not present

## 2022-07-04 DIAGNOSIS — F17210 Nicotine dependence, cigarettes, uncomplicated: Secondary | ICD-10-CM | POA: Diagnosis not present

## 2022-07-04 DIAGNOSIS — Z7984 Long term (current) use of oral hypoglycemic drugs: Secondary | ICD-10-CM | POA: Diagnosis not present

## 2022-07-04 DIAGNOSIS — N183 Chronic kidney disease, stage 3 unspecified: Secondary | ICD-10-CM | POA: Diagnosis not present

## 2022-07-04 DIAGNOSIS — Z79899 Other long term (current) drug therapy: Secondary | ICD-10-CM | POA: Diagnosis not present

## 2022-07-04 DIAGNOSIS — I428 Other cardiomyopathies: Secondary | ICD-10-CM | POA: Diagnosis not present

## 2022-07-04 DIAGNOSIS — Z7902 Long term (current) use of antithrombotics/antiplatelets: Secondary | ICD-10-CM | POA: Insufficient documentation

## 2022-07-04 DIAGNOSIS — Z95810 Presence of automatic (implantable) cardiac defibrillator: Secondary | ICD-10-CM | POA: Diagnosis not present

## 2022-07-04 LAB — COMPREHENSIVE METABOLIC PANEL
ALT: 38 U/L (ref 0–44)
AST: 44 U/L — ABNORMAL HIGH (ref 15–41)
Albumin: 3.5 g/dL (ref 3.5–5.0)
Alkaline Phosphatase: 83 U/L (ref 38–126)
Anion gap: 9 (ref 5–15)
BUN: 13 mg/dL (ref 8–23)
CO2: 26 mmol/L (ref 22–32)
Calcium: 9.3 mg/dL (ref 8.9–10.3)
Chloride: 104 mmol/L (ref 98–111)
Creatinine, Ser: 1.04 mg/dL (ref 0.61–1.24)
GFR, Estimated: >60 mL/min (ref >60)
Glucose, Bld: 97 mg/dL (ref 70–99)
Potassium: 4 mmol/L (ref 3.5–5.1)
Sodium: 139 mmol/L (ref 135–145)
Total Bilirubin: 0.6 mg/dL (ref 0.3–1.2)
Total Protein: 6.3 g/dL — ABNORMAL LOW (ref 6.5–8.1)

## 2022-07-04 LAB — ECHOCARDIOGRAM COMPLETE
AR max vel: 3.68 cm2
AV Area VTI: 3.24 cm2
AV Area mean vel: 3.72 cm2
AV Mean grad: 2 mmHg
AV Peak grad: 4.3 mmHg
Ao pk vel: 1.04 m/s
Area-P 1/2: 2.2 cm2
Calc EF: 36.5 %
S' Lateral: 4.75 cm
Single Plane A2C EF: 40.6 %
Single Plane A4C EF: 33.7 %

## 2022-07-04 LAB — T4, FREE: Free T4: 1.38 ng/dL — ABNORMAL HIGH (ref 0.61–1.12)

## 2022-07-04 LAB — TSH: TSH: 0.227 u[IU]/mL — ABNORMAL LOW (ref 0.350–4.500)

## 2022-07-04 MED ORDER — MEXILETINE HCL 150 MG PO CAPS: 150.0000 mg | ORAL_CAPSULE | Freq: Two times a day (BID) | ORAL | 3 refills | Status: DC

## 2022-07-04 NOTE — Patient Instructions (Signed)
STOP Amiodarone  START Mexiletine 150 mg Twice daily  Your provider has recommended that you have a home sleep study.  We have provided you with the equipment in our office today. Please download the app and follow the instructions. YOUR PIN NUMBER IS: [id number]. Once you have completed the test you just dispose of the equipment, the information is automatically uploaded to [place] via blue-tooth technology. If your test is positive for sleep apnea and you need a home CPAP machine you will be contacted by Dr Theodosia Blender office [facility]) to set this up.  Your physician recommends that you schedule a follow-up appointment in: 3 months.  If you have any questions or concerns before your next appointment please send [place] a message through [place] or call our office at [phone].    TO LEAVE A MESSAGE FOR THE NURSE SELECT OPTION 2, PLEASE LEAVE A MESSAGE INCLUDING: YOUR NAME DATE OF BIRTH CALL BACK NUMBER REASON FOR CALL**this is important as we prioritize the call backs  YOU WILL RECEIVE A CALL BACK THE SAME DAY AS LONG AS YOU CALL BEFORE 4:00 PM  At the [facility], you and your health needs are our priority. As part of our continuing mission to provide you with exceptional heart care, we have created designated Provider Care Teams. These Care Teams include your primary Cardiologist (physician) and Advanced Practice Providers (APPs- Physician Assistants and Nurse Practitioners) who all work together to provide you with the care you need, when you need it.   You may see any of the following providers on your designated Care Team at your next follow up: Dr Glori Bickers Dr Loralie Champagne Dr. Roxana Hires, NP Lyda Jester, [place] [facility] [place], [place] Forestine Na, NP Audry Riles, PharmD   Please be sure to bring in all your medications bottles to every appointment.

## 2022-07-04 NOTE — Progress Notes (Signed)
  Echocardiogram 2D Echocardiogram has been performed.  Nicholas Herrera 07/04/2022, 11:56 AM

## 2022-07-04 NOTE — Progress Notes (Signed)
PCP: Jonathon Jordan, MD Cardiology: Dr. Tamala Julian HF Cardiology: Dr. Aundra Dubin  81 y.o. with PAD, smoking, and a long history of cardiomyopathy was referred by Dr. Tamala Julian for evaluation of CHF/cardiomyopathy.  Patient was diagnosed with a cardiomyopathy prior to 2004 while he was living in [place].  He had an ICD placed in 2004.  Per notes, EF was < 30% during this time.  He thinks he may have had a heart catheterization but does not remember the details.  The ICD subsequently malfunctioned and was turned off in 2009.  It is still implanted and has 2 nonfunctioning leads.  Echo in 2011 after moving the [facility] showed improved EF at 40-45%.  He did not have an echo over the next 9 years.  However, Echo done in 7/20 showed EF down to 20-25% with a moderately dilated LV.  He has been subsequently started on HF meds.  He saw Dr. Lovena Le to decide about explantation of old ICD/implantation of new ICD.  He was thought to be not a candidate for a new intravascular ICD.  Subcutaneous ICD would be a potential option but utility would like be limited with nonischemic cardiomyopathy at age 71.   Patient also has an extensive PAD history.  In 9/20 he had PTCA to left external and common iliac arteries.    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  He was well-compensated.   Peripheral angiography in 5/21 with stent to the right external iliac artery.   He had presumed diverticular bleeding in 10/21.   Holter in 3/22 with very frequent PVCs, 21% total beats.  Amiodarone was started.  Repeat Zio monitor in 7/22 showed PVCs down to 1.8%.   Echo in 4/22 showed EF 25-30%, mild LV enlargement, normal RV.    In 1/23, he had balloon angioplasty of right CIA.   Echo was done today and reviewed, EF 30-35%, mildly decreased RV systolic function, normal IVC.   He returns today for followup of CHF.  Still smoking.  Right calf claudication has improved dramatically since his PTCA and is now  minimal.  He was noted to have hyperthyroidism by labs, but he has not been able to get in to see endocrinology yet. He has an appointment with [facility].  Weight is down 2 lbs.  BP elevated today but is in normal range when he checks at home.  He is not short of breath walking on flat ground.  Overall, energy level is lower.  His tremor has worsened recently.  No chest pain.  He has hip pain chronically. No lightheadedness.   Labs (2/20): LDL 93 Labs (11/20): K 4, creatinine 0.99  Labs (1/21): LDL 89 Labs (2/21): K 4.2, creatinine 0.97 Labs (4/21): LDL 51, HDL 50, K 3.7, creatinine 0.92 Labs (6/21): K 4.5, creatinine 0.86 Labs (10/21): K 4.3, creatinine 1.3, hgb 14.4 Labs (3/22): LDL 50 Labs (4/22): K 4.1, creatinine 1.08 Labs (7/22): K 4.4, creatinine 1.13, hgb 15.2, TSH normal, LFTs normal Labs (1/23): K 4.7, creatinine 1.17 Labs (5/23): TSH low, LDL 50, K 3.7, creatinine 1.06, LFTs normal  PMH:  1. HTN 2. Hyperlipidemia 3. Active smoker 4. PAD: Right popliteal stent in 2014 with subsequent occlusion.   - Orbital atherectomy + drug coated balloon angioplasty of left SFA in 2019.   - In 9/20, had PTCA left external and common iliac arteries.  - Peripheral angiogram in 5/21 with stent to right external iliac artery.  - 1/22 peripheral arterial dopplers with  significant bilateral iliac disease.  - 7/22 peripheral arterial dopplers with moderate stenosis right EIA stent.  - 1/23 PTCA to right CIA.  5. CKD: Stage 3.  6. Cardiomyopathy: Patient had an ICD placed in [place] in 2004, so presumably had a pre-dating cardiomyopathy.  He thinks that he had a heart cath in [place] but is not sure.  EF <30% when he moved to [place].  - Echo (2011) with EF improved to 40-45%.  - Echo (7/20): EF 20-25%, moderate LV dilation, normal RV size and systolic function.  - Patient has 2 nonfunctioning ICD leads (ICD is not functional, turned off in 2009).  - LHC/RHC (2/21): Nonobstructive CAD.   Mean RA 3, PA 25/5, mean PCWP 7, CI 2.4.  - Echo (4/22): EF 25-30%, mild LV enlargement, normal RV. - Echo (9/23): EF 30-35%, mildly decreased RV systolic function, normal IVC. 7. Type 2 diabetes 8. Diverticular bleeding in 10/21.  9. PVCs: Zio patch 3/22 showed 21% PVCs with 15 short NSVT runs.  - Zio monitor (7/22): 1.8% PVCs on amiodarone.  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   SH: Retired 15 years ago from [employer].  Married with 3 children, smokes about 1/2-1 ppd still, has had about 3 drinks/day for 2-3 years.   FH: Mother with cancer, father with COPD.  No heart disease that he knows of.   ROS: All systems reviewed and negative except as per HPI.   Current Outpatient Medications  Medication Sig Dispense Refill   carvedilol (COREG) 12.5 MG tablet Take 1 tablet (12.5 mg total) by mouth 2 (two) times daily. 180 tablet 3   clopidogrel (PLAVIX) 75 MG tablet Take 1 tablet (75 mg total) by mouth daily with breakfast. 30 tablet 11   diphenhydramine-acetaminophen (TYLENOL PM) 25-500 MG TABS tablet Take 2 tablets by mouth at bedtime.     ENTRESTO 97-103 MG TAKE 1 TABLET BY MOUTH TWICE A DAY 60 tablet 11   eplerenone (INSPRA) 50 MG tablet Take 1 tablet (50 mg total) by mouth daily. 90 tablet 3   ezetimibe (ZETIA) 10 MG tablet TAKE 1 TABLET BY MOUTH EVERY DAY 90 tablet 3   JARDIANCE 10 MG TABS tablet TAKE 1 TABLET BY MOUTH EVERY DAY BEFORE BREAKFAST 30 tablet 6   mexiletine (MEXITIL) 150 MG capsule Take 1 capsule (150 mg total) by mouth 2 (two) times daily. 180 capsule 3   mirabegron ER (MYRBETRIQ) 50 MG TB24 tablet Take 50 mg by mouth daily.     Multiple Vitamins-Minerals (ONE-A-DAY MENS 50+ ADVANTAGE PO) Take 1 tablet by mouth daily.     PARoxetine (PAXIL) 40 MG tablet Take 40 mg by mouth daily.     rosuvastatin (CRESTOR) 40 MG tablet Take 1 tablet (40 mg total) by mouth daily. 90 tablet 3   sodium chloride (OCEAN) 0.65 % SOLN nasal spray Place 1 spray into both  nostrils daily as needed for congestion.     venlafaxine XR (EFFEXOR-XR) 75 MG 24 hr capsule Take 75 mg by mouth daily.     No current facility-administered medications for this encounter.   BP (!) 150/80   Pulse (!) 52   Wt 66 kg (145 lb 6.4 oz)   SpO2 99%   BMI 20.86 kg/m  General: NAD Neck: No JVD, no thyromegaly or thyroid nodule.  Lungs: Clear to auscultation bilaterally with normal respiratory effort. CV: Nondisplaced PMI.  Heart regular S1/S2, no S3/S4, no murmur.  No peripheral edema.  No carotid bruit.  Difficult to  palpate pedal pulses.  Abdomen: Soft, nontender, no hepatosplenomegaly, no distention.  Skin: Intact without lesions or rashes.  Neurologic: Alert and oriented x 3.  Psych: Normal affect. Extremities: No clubbing or cyanosis.  HEENT: Normal.   Assessment/Plan: 1. Chronic systolic CHF:  Patient has had a known nonischemic cardiomyopathy since prior to 2004.  EF had improved to 40-45% in 2011 but had dropped significantly on echo in 7/20 with EF 20-25%. He has a nonfunctioning ICD.  Cause of the cardiomyopathy is uncertain.  Coronary angiography in 2/21 showed no significant coronary disease. He has no family history of cardiomyopathy.  RHC in 2/21 showed normal filling pressures and normal cardiac output.  Echo in 4/22 with EF still low at 25-30%. PVCs likely contributed to his cardiomyopathy, Zio patch in 3/22 with 21% PVCs.  Echo today showed EF 30-35%, mildly decreased RV systolic function, normal IVC.  NYHA class I-II symptoms, not volume overloaded on exam.  - Continue lower dose of Coreg, 12.5 mg bid, due to orthostatic symptoms.  - Continue eplerenone 50 mg daily. BMET today.   - Continue Entresto 97/103 bid.   - Continue Jardiance 10 mg daily.  - He does not appear to need Lasix.  - Patient is not a candidate for another intravascular ICD.  Limited utility to implanting subcutaneous ICD in the setting of a nonischemic cardiomyopathy with advanced age.   2.  PAD: Significant improvement since R CIA PTCA in 1/23.  Unfortunately, still smoking.      - Stay active.  - Continue Plavix 75 and Crestor 40 mg daily + Zetia 10 mg daily.  - Has peripheral arterial dopplers ordered.  - Needs to quit smoking => he has been unable to do this.  3. HTN: BP mildly elevated here today but has been normal at other times.  4. Hyperlipidemia: Continue Crestor and Zetia.  5. PVCs: Frequent in past, may contribute to cardiomyopathy.  7/22 Zio monitor on amiodarone showed PVC percentage down to 1.8%.  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   - Stop amiodarone today with hyperthyroidism.  I will start him on mexiletine 150 mg bid to try to suppress PVCs.   6. Suspect OSA: Daytime sleepiness and snoring.  He needs to redo the home sleep study.  7. 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   - Repeat TFTs today.  - Stop amiodarone as above.  - If thyroid indices still suggest hyperthyroidism, will start methimazole 5 mg tid.  - He has an appointment with [facility] endocrinology scheduled.   Followup in 3 months.   Loralie Champagne 07/04/2022

## 2022-07-04 NOTE — Progress Notes (Signed)
Height:     Weight: BMI:  Today's Date:  STOP BANG RISK ASSESSMENT S (snore) Have you been told that you snore?     YES   T (tired) Are you often tired, fatigued, or sleepy during the day?   YES  O (obstruction) Do you stop breathing, choke, or gasp during sleep? NO   P (pressure) Do you have or are you being treated for high blood pressure? YES   B (BMI) Is your body index greater than 35 kg/m? NO   A (age) Are you 80 years old or older? YES   N (neck) Do you have a neck circumference greater than 16 inches?   NO   G (gender) Are you a male? YES   TOTAL STOP/BANG "YES" ANSWERS 5                                                                       For Office Use Only              Procedure Order Form    YES to 3+ Stop Bang questions OR two clinical symptoms - patient qualifies for WatchPAT (CPT 95800)      Clinical Notes: Will consult Sleep Specialist and refer for management of therapy due to patient increased risk of Sleep Apnea. Ordering a sleep study due to the following two clinical symptoms: Excessive daytime sleepiness G47.10 / Gastroesophageal reflux K21.9 / Nocturia R35.1 / Morning Headaches G44.221 / Difficulty concentrating R41.840 / Memory problems or poor judgment G31.84 / Personality changes or irritability R45.4 / Loud snoring R06.83 / Depression F32.9 / Unrefreshed by sleep G47.8 / Impotence N52.9 / History of high blood pressure R03.0 / Insomnia G47.00       

## 2022-07-05 LAB — T3, FREE: T3, Free: 5.4 pg/mL — ABNORMAL HIGH (ref 2.0–4.4)

## 2022-07-06 ENCOUNTER — Telehealth (HOSPITAL_COMMUNITY): Payer: Self-pay | Admitting: *Deleted

## 2022-07-06 ENCOUNTER — Telehealth (HOSPITAL_COMMUNITY): Payer: Self-pay

## 2022-07-06 MED ORDER — METHIMAZOLE 5 MG PO TABS: 5.0000 mg | ORAL_TABLET | Freq: Three times a day (TID) | ORAL | 5 refills | Status: DC

## 2022-07-06 NOTE — Telephone Encounter (Signed)
Received vm that patient wanted a call back to clarify recent med changes. I called pt back no answer/left vm for return call.

## 2022-07-06 NOTE — Telephone Encounter (Addendum)
Pt aware, agreeable, and verbalized understanding  Lab order mailed and Rx sent to pharmacy   ----- Message from Larey Dresser, MD sent at 07/04/2022  4:23 PM EDT ----- Start methimazole 5 mg tid for hyperthyroidism.  Make sure he stopped amiodarone.  Repeat TSH, free T3, and free T4 in 1 month.

## 2022-07-11 ENCOUNTER — Ambulatory Visit (HOSPITAL_BASED_OUTPATIENT_CLINIC_OR_DEPARTMENT_OTHER)
Admission: RE | Admit: 2022-07-11 | Discharge: 2022-07-11 | Disposition: A | Discharge status: Home / Self Care | Payer: Medicare Other | Source: Ambulatory Visit | Attending: Cardiovascular Disease | Admitting: Cardiovascular Disease

## 2022-07-11 ENCOUNTER — Ambulatory Visit (HOSPITAL_COMMUNITY)
Admission: RE | Admit: 2022-07-11 | Discharge: 2022-07-11 | Disposition: A | Discharge status: Home / Self Care | Payer: Medicare Other | Source: Ambulatory Visit | Attending: Cardiovascular Disease | Admitting: Cardiovascular Disease

## 2022-07-11 DIAGNOSIS — I739 Peripheral vascular disease, unspecified: Secondary | ICD-10-CM | POA: Insufficient documentation

## 2022-07-11 DIAGNOSIS — Z95820 Peripheral vascular angioplasty status with implants and grafts: Secondary | ICD-10-CM

## 2022-07-17 ENCOUNTER — Telehealth: Payer: Self-pay | Admitting: *Deleted

## 2022-07-17 ENCOUNTER — Encounter: Payer: Self-pay | Admitting: *Deleted

## 2022-07-17 NOTE — Patient Instructions (Signed)
Visit Information  Thank you for taking time to visit with me today. Please don't hesitate to contact me if I can be of assistance to you.   Following are the goals we discussed today:   Goals Addressed               This Visit's Progress     COMPLETED: No needs (pt-stated)        Care Coordination Interventions: Provided education to patient and/or caregiver about advanced directives Reviewed medications with patient and discussed adherence Reviewed scheduled/upcoming provider appointments including pending appointments and verified AWV for 2023 Screening for signs and symptoms of depression related to chronic disease state  Assessed social determinant of health barriers         Please call the care guide team at [phone] if you need to cancel or reschedule your appointment.   If you are experiencing a Mental Health or [place] or need someone to talk to, please call the Suicide and Crisis Lifeline: 988  Patient verbalizes understanding of instructions and care plan provided today and agrees to view in [place]. Active MyChart status and patient understanding of how to access instructions and care plan via MyChart confirmed with patient.     No further follow up required: No needs  Raina Mina, RN Care Management Coordinator [place] Office [phone]

## 2022-07-17 NOTE — Patient Outreach (Signed)
  Care Coordination   Initial Visit Note   07/17/2022 Name: Nicholas Herrera MRN: [id number] DOB: October 07, 1941  Nicholas Herrera is a 81 y.o. year old male who sees Nicholas Jordan, MD for primary care. I spoke with  Nicholas Herrera by phone today.  What matters to the patients health and wellness today?  No needs    Goals Addressed               This Visit's Progress     COMPLETED: No needs (pt-stated)        Care Coordination Interventions: Provided education to patient and/or caregiver about advanced directives Reviewed medications with patient and discussed adherence Reviewed scheduled/upcoming provider appointments including pending appointments and verified AWV for 2023 Screening for signs and symptoms of depression related to chronic disease state  Assessed social determinant of health barriers         SDOH assessments and interventions completed:  Yes  SDOH Interventions Today    Flowsheet Row Most Recent Value  SDOH Interventions   Food Insecurity Interventions Intervention Not Indicated  Housing Interventions Intervention Not Indicated  Transportation Interventions Intervention Not Indicated        Care Coordination Interventions Activated:  Yes  Care Coordination Interventions:  Yes, provided   Follow up plan: No further intervention required.   Encounter Outcome:  Pt. Visit Completed   Raina Mina, RN Care Management Coordinator [place] Office [phone]

## 2022-07-24 ENCOUNTER — Other Ambulatory Visit: Payer: Self-pay | Admitting: *Deleted

## 2022-07-24 DIAGNOSIS — L570 Actinic keratosis: Secondary | ICD-10-CM | POA: Diagnosis not present

## 2022-07-24 DIAGNOSIS — Z85828 Personal history of other malignant neoplasm of skin: Secondary | ICD-10-CM | POA: Diagnosis not present

## 2022-07-24 DIAGNOSIS — D692 Other nonthrombocytopenic purpura: Secondary | ICD-10-CM | POA: Diagnosis not present

## 2022-07-24 DIAGNOSIS — D485 Neoplasm of uncertain behavior of skin: Secondary | ICD-10-CM | POA: Diagnosis not present

## 2022-07-24 DIAGNOSIS — C44629 Squamous cell carcinoma of skin of left upper limb, including shoulder: Secondary | ICD-10-CM | POA: Diagnosis not present

## 2022-07-24 DIAGNOSIS — I739 Peripheral vascular disease, unspecified: Secondary | ICD-10-CM

## 2022-07-30 DIAGNOSIS — Z23 Encounter for immunization: Secondary | ICD-10-CM | POA: Diagnosis not present

## 2022-07-31 DIAGNOSIS — R350 Frequency of micturition: Secondary | ICD-10-CM | POA: Diagnosis not present

## 2022-07-31 DIAGNOSIS — R3915 Urgency of urination: Secondary | ICD-10-CM | POA: Diagnosis not present

## 2022-08-07 ENCOUNTER — Ambulatory Visit: Payer: Medicare Other | Attending: Cardiovascular Disease | Admitting: Cardiovascular Disease

## 2022-08-07 ENCOUNTER — Encounter: Payer: Self-pay | Admitting: Cardiovascular Disease

## 2022-08-07 VITALS — BP 118/80 | HR 59 | Ht 70.0 in | Wt 145.6 lb

## 2022-08-07 DIAGNOSIS — I2510 Atherosclerotic heart disease of native coronary artery without angina pectoris: Secondary | ICD-10-CM | POA: Diagnosis not present

## 2022-08-07 DIAGNOSIS — I739 Peripheral vascular disease, unspecified: Secondary | ICD-10-CM | POA: Diagnosis not present

## 2022-08-07 DIAGNOSIS — E782 Mixed hyperlipidemia: Secondary | ICD-10-CM | POA: Insufficient documentation

## 2022-08-07 DIAGNOSIS — I5022 Chronic systolic (congestive) heart failure: Secondary | ICD-10-CM | POA: Insufficient documentation

## 2022-08-07 DIAGNOSIS — Z720 Tobacco use: Secondary | ICD-10-CM | POA: Diagnosis not present

## 2022-08-07 NOTE — Patient Instructions (Addendum)
Medication Instructions:  Your physician recommends that you continue on your current medications as directed. Please refer to the Current Medication list given to you today.  *If you need a refill on your cardiac medications before your next appointment, please call your pharmacy*  Lab Work: NONE ordered at this time of appointment   If you have labs (blood work) drawn today and your tests are completely normal, you will receive your results only by: [place] (if you have MyChart) OR A paper copy in the mail If you have any lab test that is abnormal or we need to change your treatment, we will call you to review the results.  Testing/Procedures: NONE ordered at this time of appointment   Follow-Up: At [facility], you and your health needs are our priority.  As part of our continuing mission to provide you with exceptional heart care, we have created designated Provider Care Teams.  These Care Teams include your primary Cardiologist (physician) and Advanced Practice Providers (APPs -  Physician Assistants and Nurse Practitioners) who all work together to provide you with the care you need, when you need it.   Your next appointment:   1 year(s)  The format for your next appointment:   In Person  Provider:   Kathlyn [place], MD     Other Instructions  Important Information About [place]

## 2022-08-07 NOTE — Progress Notes (Signed)
Cardiology Office Note   Date:  08/07/2022   ID:  Nicholas Herrera, DOB 08-20-1941, MRN [id number]  PCP:  Jonathon Jordan, MD  Cardiologist:  Dr. Tamala Julian  No chief complaint on file.      History of Present Illness: Nicholas Herrera is a 81 y.o. male who is here today for a follow-up visit regarding peripheral arterial disease. He has known history of coronary artery disease, chronic systolic/diastolic heart failure, tobacco use and hypertension.  He is on amiodarone therapy for frequent PVCs. He has extensive peripheral arterial disease.  He has known occluded stent in the right proximal popliteal artery.  He is status post left SFA atherectomy and drug-coated balloon angioplasty in 2019, left common iliac and external iliac artery drug-coated balloon angioplasty in 2020.  He is also status post orbital atherectomy and balloon expandable stent placement to the right common iliac artery as well as angioplasty and drug-eluting stent placement to the right external iliac artery.  He had previous GI bleed due to diverticulosis.  Most recent angiography in March 2023 showed patent right common iliac artery stent with severe restenosis, moderate left external iliac artery stenosis and occluded distal right SFA stent with collaterals and three-vessel runoff below the knee.  I performed successful drug-coated balloon angioplasty to the right common iliac artery with good results.   Most recent vascular studies last month showed an ABI of 0.58 on the right and 0.84 on the left.  Duplex showed patent right iliac stent with moderately elevated velocities with no significant restenosis in the right external iliac artery.  He has been doing reasonably well overall with no significant leg claudication.  He has discomfort in the left hip that is worse with certain movements.  He is known to have arthritis there.  Amiodarone was stopped recently due to thyroid issues. Take down on tobacco use but has not  been able to quit completely.   Past Medical History:  Diagnosis Date   Abdominal distention    Abdominal pain    Bronchitis    hx of   Cancer (HCC)    skin   CHF (congestive heart failure) (HCC)     (hx of EF 27%) s/p AICD 2004. Most recent LVEF > 40% , 8/11   Chronic kidney disease    has small stones, no treatment   Cough    Depression    takes paxil   Diverticulosis    hx of   Dysrhythmia    Easy bruising    Hematuria    hx of   Hernia october 2012   [facility]   HTN (hypertension)    Hyperlipidemia    takes zocor   Inguinal hernia    Left groin pain     Past Surgical History:  Procedure Laterality Date   ABDOMINAL AORTOGRAM W/LOWER EXTREMITY N/A 09/03/2018   Procedure: ABDOMINAL AORTOGRAM W/LOWER EXTREMITY;  Surgeon: Wellington Hampshire, MD;  Location: [place] [facility];  Service: Cardiovascular;  Laterality: N/A;   ABDOMINAL AORTOGRAM W/LOWER EXTREMITY N/A 07/29/2019   Procedure: ABDOMINAL AORTOGRAM W/LOWER EXTREMITY;  Surgeon: Wellington Hampshire, MD;  Location: [place] [facility];  Service: Cardiovascular;  Laterality: N/A;   ABDOMINAL AORTOGRAM W/LOWER EXTREMITY Bilateral 03/23/2020   Procedure: ABDOMINAL AORTOGRAM W/LOWER EXTREMITY;  Surgeon: Wellington Hampshire, MD;  Location: [place] [facility];  Service: Cardiovascular;  Laterality: Bilateral;   ABDOMINAL AORTOGRAM W/LOWER EXTREMITY N/A 11/22/2021   Procedure: ABDOMINAL AORTOGRAM W/LOWER EXTREMITY;  Surgeon: Kathlyn Sacramento  A, MD;  Location: [place] [facility];  Service: Cardiovascular;  Laterality: N/A;   APPENDECTOMY     CARDIAC CATHETERIZATION     CARDIAC DEFIBRILLATOR REMOVAL  2005   HERNIA REPAIR  09/11/11   repair of [facility]    INGUINAL HERNIA REPAIR  09/11/2011   Procedure: HERNIA REPAIR INGUINAL ADULT;  Surgeon: Judieth Keens, DO;  Location: [place];  Service: General;  Laterality: Left;  open left inguinal hernia with mesh   LOWER EXTREMITY ANGIOGRAM N/A 02/05/2013   Procedure: LOWER EXTREMITY ANGIOGRAM;   Surgeon: Jettie Booze, MD;  Location: [facility] C[facility] LAB;  Service: Cardiovascular;  Laterality: N/A;   PERIPHERAL VASCULAR BALLOON ANGIOPLASTY  07/29/2019   Procedure: PERIPHERAL VASCULAR BALLOON ANGIOPLASTY;  Surgeon: Wellington Hampshire, MD;  Location: [place] [facility];  Service: Cardiovascular;;   PERIPHERAL VASCULAR BALLOON ANGIOPLASTY  11/22/2021   Procedure: PERIPHERAL VASCULAR BALLOON ANGIOPLASTY;  Surgeon: Wellington Hampshire, MD;  Location: [place] [facility];  Service: Cardiovascular;;   PERIPHERAL VASCULAR INTERVENTION Left 09/03/2018   Procedure: PERIPHERAL VASCULAR INTERVENTION;  Surgeon: Wellington Hampshire, MD;  Location: [place] [facility];  Service: Cardiovascular;  Laterality: Left;   PERIPHERAL VASCULAR INTERVENTION Right 03/23/2020   Procedure: PERIPHERAL VASCULAR INTERVENTION;  Surgeon: Wellington Hampshire, MD;  Location: [place] [facility];  Service: Cardiovascular;  Laterality: Right;   RIGHT/LEFT HEART CATH AND CORONARY ANGIOGRAPHY N/A 12/15/2019   Procedure: RIGHT/LEFT HEART CATH AND CORONARY ANGIOGRAPHY;  Surgeon: [name], MD;  Location: [place] [facility];  Service: Cardiovascular;  Laterality: N/A;     Current Outpatient Medications  Medication Sig Dispense Refill   carvedilol (COREG) 12.5 MG tablet Take 1 tablet (12.5 mg total) by mouth 2 (two) times daily. 180 tablet 3   clopidogrel (PLAVIX) 75 MG tablet Take 1 tablet (75 mg total) by mouth daily with breakfast. 30 tablet 11   diphenhydramine-acetaminophen (TYLENOL PM) 25-500 MG TABS tablet Take 2 tablets by mouth at bedtime.     ENTRESTO 97-103 MG TAKE 1 TABLET BY MOUTH TWICE A DAY 60 tablet 11   eplerenone (INSPRA) 50 MG tablet Take 1 tablet (50 mg total) by mouth daily. 90 tablet 3   ezetimibe (ZETIA) 10 MG tablet TAKE 1 TABLET BY MOUTH EVERY DAY 90 tablet 3   JARDIANCE 10 MG TABS tablet TAKE 1 TABLET BY MOUTH EVERY DAY BEFORE BREAKFAST 30 tablet 6   methimazole (TAPAZOLE) 5 MG tablet Take 1 tablet (5 mg total) by  mouth 3 (three) times daily. 180 tablet 5   mexiletine (MEXITIL) 150 MG capsule Take 1 capsule (150 mg total) by mouth 2 (two) times daily. 180 capsule 3   mirabegron ER (MYRBETRIQ) 50 MG TB24 tablet Take 50 mg by mouth daily.     Multiple Vitamins-Minerals (ONE-A-DAY MENS 50+ ADVANTAGE PO) Take 1 tablet by mouth daily.     PARoxetine (PAXIL) 40 MG tablet Take 40 mg by mouth daily.     rosuvastatin (CRESTOR) 40 MG tablet Take 1 tablet (40 mg total) by mouth daily. 90 tablet 3   sodium chloride (OCEAN) 0.65 % SOLN nasal spray Place 1 spray into both nostrils daily as needed for congestion.     venlafaxine XR (EFFEXOR-XR) 75 MG 24 hr capsule Take 75 mg by mouth daily.     No current facility-administered medications for this visit.    Allergies:   Spironolactone and Sulfa antibiotics    Social History:  The patient  reports that he  has been smoking cigars and cigarettes. He has a 48.00 pack-year smoking history. He has never used smokeless tobacco. He reports current alcohol use of about 7.0 standard drinks of alcohol per week. He reports that he does not use drugs.   Family History:  The patient's family history includes Cancer in his mother.    ROS:  Please see the history of present illness.   Otherwise, review of systems are positive for none.   All other systems are reviewed and negative.    PHYSICAL EXAM: VS:  BP 118/80   Pulse (!) 59   Ht '5\' 10"'$  (1.778 m)   Wt 145 lb 9.6 oz (66 kg)   SpO2 94%   BMI 20.89 kg/m  , BMI Body mass index is 20.89 kg/m. GEN: Well nourished, well developed, in no acute distress  HEENT: normal  Neck: no JVD, carotid bruits, or masses Cardiac: RRR; no murmurs, rubs, or gallops,no edema  Respiratory:  clear to auscultation bilaterally, normal work of breathing GI: soft, nontender, nondistended, + BS MS: no deformity or atrophy  Skin: warm and dry, no rash Neuro:  Strength and sensation are intact Psych: euthymic mood, full affect Vascular: Femoral  pulse: +1 bilaterally  EKG:  EKG is ordered today. EKG showed sinus bradycardia with nonspecific ST changes.  Recent Labs: 11/07/2021: Hemoglobin 14.5; Platelets 175 07/04/2022: ALT 38; BUN 13; Creatinine, Ser 1.04; Potassium 4.0; Sodium 139; TSH 0.227    Lipid Panel    Component Value Date/Time   CHOL 119 03/01/2022 1206   CHOL 115 02/19/2020 0914   TRIG 64 03/01/2022 1206   HDL 56 03/01/2022 1206   HDL 50 02/19/2020 0914   CHOLHDL 2.1 03/01/2022 1206   VLDL 13 03/01/2022 1206   LDLCALC 50 03/01/2022 1206   LDLCALC 51 02/19/2020 0914      Wt Readings from Last 3 Encounters:  08/07/22 145 lb 9.6 oz (66 kg)  07/04/22 145 lb 6.4 oz (66 kg)  03/01/22 147 lb 3.2 oz (66.8 kg)           No data to display            ASSESSMENT AND PLAN:   1.  Peripheral arterial disease: Status post bilateral endovascular intervention on iliac arteries with known chronically occluded right SFA/popliteal artery stent.   Currently with no significant claudication.  Continue medical therapy.  Repeat Doppler studies in September 2024.    2. Tobacco use: I again discussed the importance of smoking cessation.  He cut down on tobacco use but has not been able to quit completely.  3. Hyperlipidemia: I reviewed most recent lipid profile which showed an LDL of 50 and triglyceride of 56.  Continue treatment with rosuvastatin and Zetia.   4. Coronary artery disease: No anginal symptoms.    5. Chronic systolic heart failure: He is followed by the heart failure clinic .  He is currently on optimal medical therapy.    6.  Essential hypertension: Blood pressure is reasonably controlled on current medications    Disposition:   Follow-up in 12 months.   Signed,  Kathlyn Sacramento, MD  08/07/2022 9:49 AM    [place]

## 2022-08-13 DIAGNOSIS — I10 Essential (primary) hypertension: Secondary | ICD-10-CM | POA: Diagnosis not present

## 2022-08-13 DIAGNOSIS — I5042 Chronic combined systolic (congestive) and diastolic (congestive) heart failure: Secondary | ICD-10-CM | POA: Diagnosis not present

## 2022-08-13 DIAGNOSIS — E0590 Thyrotoxicosis, unspecified without thyrotoxic crisis or storm: Secondary | ICD-10-CM | POA: Diagnosis not present

## 2022-08-13 DIAGNOSIS — F330 Major depressive disorder, recurrent, mild: Secondary | ICD-10-CM | POA: Diagnosis not present

## 2022-08-13 DIAGNOSIS — E1169 Type 2 diabetes mellitus with other specified complication: Secondary | ICD-10-CM | POA: Diagnosis not present

## 2022-08-13 DIAGNOSIS — G250 Essential tremor: Secondary | ICD-10-CM | POA: Diagnosis not present

## 2022-08-13 DIAGNOSIS — I2510 Atherosclerotic heart disease of native coronary artery without angina pectoris: Secondary | ICD-10-CM | POA: Diagnosis not present

## 2022-08-15 DIAGNOSIS — Z23 Encounter for immunization: Secondary | ICD-10-CM | POA: Diagnosis not present

## 2022-08-31 ENCOUNTER — Telehealth (HOSPITAL_COMMUNITY): Payer: Self-pay

## 2022-08-31 NOTE — Telephone Encounter (Signed)
Clearance faxed to [facility].

## 2022-09-18 ENCOUNTER — Telehealth (HOSPITAL_COMMUNITY): Payer: Self-pay | Admitting: Surgery

## 2022-09-18 NOTE — Telephone Encounter (Signed)
I attempted to reach patient in reference to ordered home sleep study.  I left a message to remind him to complete the study or return for credit.

## 2022-10-04 ENCOUNTER — Ambulatory Visit (HOSPITAL_COMMUNITY)
Admission: RE | Admit: 2022-10-04 | Discharge: 2022-10-04 | Disposition: A | Discharge status: Home / Self Care | Payer: Medicare Other | Source: Ambulatory Visit | Attending: Cardiology | Admitting: Cardiology

## 2022-10-04 ENCOUNTER — Encounter (HOSPITAL_COMMUNITY): Payer: Self-pay | Admitting: Cardiology

## 2022-10-04 VITALS — BP 102/60 | HR 53 | Wt 147.0 lb

## 2022-10-04 DIAGNOSIS — I428 Other cardiomyopathies: Secondary | ICD-10-CM | POA: Diagnosis not present

## 2022-10-04 DIAGNOSIS — F17210 Nicotine dependence, cigarettes, uncomplicated: Secondary | ICD-10-CM | POA: Diagnosis not present

## 2022-10-04 DIAGNOSIS — I739 Peripheral vascular disease, unspecified: Secondary | ICD-10-CM | POA: Diagnosis not present

## 2022-10-04 DIAGNOSIS — M1990 Unspecified osteoarthritis, unspecified site: Secondary | ICD-10-CM | POA: Insufficient documentation

## 2022-10-04 DIAGNOSIS — I493 Ventricular premature depolarization: Secondary | ICD-10-CM | POA: Insufficient documentation

## 2022-10-04 DIAGNOSIS — I5042 Chronic combined systolic (congestive) and diastolic (congestive) heart failure: Secondary | ICD-10-CM | POA: Diagnosis not present

## 2022-10-04 DIAGNOSIS — I130 Hypertensive heart and chronic kidney disease with heart failure and stage 1 through stage 4 chronic kidney disease, or unspecified chronic kidney disease: Secondary | ICD-10-CM | POA: Insufficient documentation

## 2022-10-04 DIAGNOSIS — Z79899 Other long term (current) drug therapy: Secondary | ICD-10-CM | POA: Diagnosis not present

## 2022-10-04 DIAGNOSIS — I5022 Chronic systolic (congestive) heart failure: Secondary | ICD-10-CM | POA: Diagnosis not present

## 2022-10-04 DIAGNOSIS — N183 Chronic kidney disease, stage 3 unspecified: Secondary | ICD-10-CM | POA: Insufficient documentation

## 2022-10-04 DIAGNOSIS — R0683 Snoring: Secondary | ICD-10-CM | POA: Insufficient documentation

## 2022-10-04 DIAGNOSIS — Z95810 Presence of automatic (implantable) cardiac defibrillator: Secondary | ICD-10-CM | POA: Diagnosis not present

## 2022-10-04 DIAGNOSIS — Z7902 Long term (current) use of antithrombotics/antiplatelets: Secondary | ICD-10-CM | POA: Diagnosis not present

## 2022-10-04 DIAGNOSIS — R002 Palpitations: Secondary | ICD-10-CM | POA: Insufficient documentation

## 2022-10-04 DIAGNOSIS — E785 Hyperlipidemia, unspecified: Secondary | ICD-10-CM | POA: Diagnosis not present

## 2022-10-04 DIAGNOSIS — E0590 Thyrotoxicosis, unspecified without thyrotoxic crisis or storm: Secondary | ICD-10-CM | POA: Insufficient documentation

## 2022-10-04 LAB — BASIC METABOLIC PANEL
Anion gap: 8 (ref 5–15)
BUN: 13 mg/dL (ref 8–23)
CO2: 26 mmol/L (ref 22–32)
Calcium: 9.1 mg/dL (ref 8.9–10.3)
Chloride: 106 mmol/L (ref 98–111)
Creatinine, Ser: 1.19 mg/dL (ref 0.61–1.24)
GFR, Estimated: >60 mL/min (ref >60)
Glucose, Bld: 88 mg/dL (ref 70–99)
Potassium: 4.1 mmol/L (ref 3.5–5.1)
Sodium: 140 mmol/L (ref 135–145)

## 2022-10-04 NOTE — Patient Instructions (Signed)
CHANGE from Ibuprofen to Tylenol  Labs done today, your results will be available in MyChart, we will contact you for abnormal readings.  Your physician recommends that you schedule a follow-up appointment in: 4 months ( April 2024)  ** please call the office in February to arrange your follow up appointment **  If you have any questions or concerns before your next appointment please send [place] a message through [place] or call our office at [phone].    TO LEAVE A MESSAGE FOR THE NURSE SELECT OPTION 2, PLEASE LEAVE A MESSAGE INCLUDING: YOUR NAME DATE OF BIRTH CALL BACK NUMBER REASON FOR CALL**this is important as we prioritize the call backs  YOU WILL RECEIVE A CALL BACK THE SAME DAY AS LONG AS YOU CALL BEFORE 4:00 PM  At the [facility], you and your health needs are our priority. As part of our continuing mission to provide you with exceptional heart care, we have created designated Provider Care Teams. These Care Teams include your primary Cardiologist (physician) and Advanced Practice Providers (APPs- Physician Assistants and Nurse Practitioners) who all work together to provide you with the care you need, when you need it.   You may see any of the following providers on your designated Care Team at your next follow up: Dr Glori Bickers Dr Loralie Champagne Dr. Roxana Hires, NP Lyda Jester, [place] [facility] [place], [place] Forestine Na, NP Audry Riles, PharmD   Please be sure to bring in all your medications bottles to every appointment.

## 2022-10-06 NOTE — Progress Notes (Signed)
PCP: Jonathon Jordan, MD Cardiology: Dr. Tamala Julian HF Cardiology: Dr. Aundra Dubin  81 y.o. with PAD, smoking, and a long history of cardiomyopathy was referred by Dr. Tamala Julian for evaluation of CHF/cardiomyopathy.  Patient was diagnosed with a cardiomyopathy prior to 2004 while he was living in [place].  He had an ICD placed in 2004.  Per notes, EF was < 30% during this time.  He thinks he may have had a heart catheterization but does not remember the details.  The ICD subsequently malfunctioned and was turned off in 2009.  It is still implanted and has 2 nonfunctioning leads.  Echo in 2011 after moving the [facility] showed improved EF at 40-45%.  He did not have an echo over the next 9 years.  However, Echo done in 7/20 showed EF down to 20-25% with a moderately dilated LV.  He has been subsequently started on HF meds.  He saw Dr. Lovena Le to decide about explantation of old ICD/implantation of new ICD.  He was thought to be not a candidate for a new intravascular ICD.  Subcutaneous ICD would be a potential option but utility would like be limited with nonischemic cardiomyopathy at age 38.   Patient also has an extensive PAD history.  In 9/20 he had PTCA to left external and common iliac arteries.    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  He was well-compensated.   Peripheral angiography in 5/21 with stent to the right external iliac artery.   He had presumed diverticular bleeding in 10/21.   Holter in 3/22 with very frequent PVCs, 21% total beats.  Amiodarone was started.  Repeat Zio monitor in 7/22 showed PVCs down to 1.8%.   Echo in 4/22 showed EF 25-30%, mild LV enlargement, normal RV.    In 1/23, he had balloon angioplasty of right CIA.   Echo in 9/23 showed EF 30-35%, mildly decreased RV systolic function, normal IVC.   He returns today for followup of CHF.  Still smoking.  Right calf claudication has improved dramatically since his PTCA and is now minimal.  He  now has mild claudication in his left calf.  He takes occasional ibuprofen for left hip pain.  No chest pain or exertional dyspnea.  He does feel like he tires more easily than in the past.  No palpitations or lightheadedness.  Not getting much exercise.  Weight up 2 lbs.   Labs (2/20): LDL 93 Labs (11/20): K 4, creatinine 0.99  Labs (1/21): LDL 89 Labs (2/21): K 4.2, creatinine 0.97 Labs (4/21): LDL 51, HDL 50, K 3.7, creatinine 0.92 Labs (6/21): K 4.5, creatinine 0.86 Labs (10/21): K 4.3, creatinine 1.3, hgb 14.4 Labs (3/22): LDL 50 Labs (4/22): K 4.1, creatinine 1.08 Labs (7/22): K 4.4, creatinine 1.13, hgb 15.2, TSH normal, LFTs normal Labs (1/23): K 4.7, creatinine 1.17 Labs (5/23): TSH low, LDL 50, K 3.7, creatinine 1.06, LFTs normal Labs (9/23): K 4, creatinine 1.04  PMH:  1. HTN 2. Hyperlipidemia 3. Active smoker 4. PAD: Right popliteal stent in 2014 with subsequent occlusion.   - Orbital atherectomy + drug coated balloon angioplasty of left SFA in 2019.   - In 9/20, had PTCA left external and common iliac arteries.  - Peripheral angiogram in 5/21 with stent to right external iliac artery.  - 1/22 peripheral arterial dopplers with significant bilateral iliac disease.  - 7/22 peripheral arterial dopplers with moderate stenosis right EIA stent.  - 1/23 PTCA to right CIA.  - ABIs (  9/23): right ABI 0.58, left ABI 0.84; patent right iliac stent.  5. CKD: Stage 3.  6. Cardiomyopathy: Patient had an ICD placed in [place] in 2004, so presumably had a pre-dating cardiomyopathy.  He thinks that he had a heart cath in [place] but is not sure.  EF <30% when he moved to [place].  - Echo (2011) with EF improved to 40-45%.  - Echo (7/20): EF 20-25%, moderate LV dilation, normal RV size and systolic function.  - Patient has 2 nonfunctioning ICD leads (ICD is not functional, turned off in 2009).  - LHC/RHC (2/21): Nonobstructive CAD.  Mean RA 3, PA 25/5, mean PCWP 7, CI 2.4.  - Echo  (4/22): EF 25-30%, mild LV enlargement, normal RV. - Echo (9/23): EF 30-35%, mildly decreased RV systolic function, normal IVC. 7. Type 2 diabetes 8. Diverticular bleeding in 10/21.  9. PVCs: Zio patch 3/22 showed 21% PVCs with 15 short NSVT runs.  - Zio monitor (7/22): 1.8% PVCs on amiodarone.  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   SH: Retired 15 years ago from [employer].  Married with 3 children, smokes about 1/2-1 ppd still, has had about 3 drinks/day for 2-3 years.   FH: Mother with cancer, father with COPD.  No heart disease that he knows of.   ROS: All systems reviewed and negative except as per HPI.   Current Outpatient Medications  Medication Sig Dispense Refill   carvedilol (COREG) 12.5 MG tablet Take 1 tablet (12.5 mg total) by mouth 2 (two) times daily. 180 tablet 3   clopidogrel (PLAVIX) 75 MG tablet Take 1 tablet (75 mg total) by mouth daily with breakfast. 30 tablet 11   diphenhydramine-acetaminophen (TYLENOL PM) 25-500 MG TABS tablet Take 2 tablets by mouth at bedtime.     ENTRESTO 97-103 MG TAKE 1 TABLET BY MOUTH TWICE A DAY 60 tablet 11   eplerenone (INSPRA) 50 MG tablet Take 1 tablet (50 mg total) by mouth daily. 90 tablet 3   ezetimibe (ZETIA) 10 MG tablet TAKE 1 TABLET BY MOUTH EVERY DAY 90 tablet 3   Ibuprofen (ADVIL PO) Take 1 tablet by mouth as needed.     JARDIANCE 10 MG TABS tablet TAKE 1 TABLET BY MOUTH EVERY DAY BEFORE BREAKFAST 30 tablet 6   methimazole (TAPAZOLE) 5 MG tablet Take 5 mg by mouth 2 (two) times daily.     mexiletine (MEXITIL) 150 MG capsule Take 1 capsule (150 mg total) by mouth 2 (two) times daily. 180 capsule 3   mirabegron ER (MYRBETRIQ) 50 MG TB24 tablet Take 50 mg by mouth daily.     Multiple Vitamins-Minerals (ONE-A-DAY MENS 50+ ADVANTAGE PO) Take 1 tablet by mouth daily.     PARoxetine (PAXIL) 40 MG tablet Take 40 mg by mouth daily.     rosuvastatin (CRESTOR) 40 MG tablet Take 1 tablet (40 mg total) by mouth daily.  90 tablet 3   sodium chloride (OCEAN) 0.65 % SOLN nasal spray Place 1 spray into both nostrils daily as needed for congestion.     venlafaxine XR (EFFEXOR-XR) 75 MG 24 hr capsule Take 75 mg by mouth daily.     No current facility-administered medications for this encounter.   BP 102/60   Pulse (!) 53   Wt 66.7 kg (147 lb)   SpO2 98%   BMI 21.09 kg/m  General: NAD Neck: No JVD, no thyromegaly or thyroid nodule.  Lungs: Clear to auscultation bilaterally with normal respiratory effort. CV: Nondisplaced PMI.  Heart  regular S1/S2, no S3/S4, no murmur.  No peripheral edema.  No carotid bruit.  Difficult to palpate pedal pulses.  Abdomen: Soft, nontender, no hepatosplenomegaly, no distention.  Skin: Intact without lesions or rashes.  Neurologic: Alert and oriented x 3.  Psych: Normal affect. Extremities: No clubbing or cyanosis.  HEENT: Normal.   Assessment/Plan: 1. Chronic systolic CHF:  Patient has had a known nonischemic cardiomyopathy since prior to 2004.  EF had improved to 40-45% in 2011 but had dropped significantly on echo in 7/20 with EF 20-25%. He has a nonfunctioning ICD.  Cause of the cardiomyopathy is uncertain.  Coronary angiography in 2/21 showed no significant coronary disease. He has no family history of cardiomyopathy.  RHC in 2/21 showed normal filling pressures and normal cardiac output.  Echo in 4/22 with EF still low at 25-30%. PVCs likely contributed to his cardiomyopathy, Zio patch in 3/22 with 21% PVCs.  Echo in 9/23 showed EF 30-35%, mildly decreased RV systolic function, normal IVC.  NYHA class I-II symptoms, not volume overloaded on exam.  - Continue lower dose of Coreg, 12.5 mg bid, due to orthostatic symptoms.  - Continue eplerenone 50 mg daily. BMET today.   - Continue Entresto 97/103 bid.   - Continue Jardiance 10 mg daily.  - He does not appear to need Lasix.  - Patient is not a candidate for another intravascular ICD.  Limited utility to implanting  subcutaneous ICD in the setting of a nonischemic cardiomyopathy with advanced age.   2. PAD: Significant improvement since R CIA PTCA in 1/23.  Unfortunately, still smoking.  He still has some claudication left calf.  - Stay active.  - Continue Plavix 75 and Crestor 40 mg daily + Zetia 10 mg daily.  - Followup with Dr. Fletcher Anon for PV.  - Needs to quit smoking => he has been unable to do this.  3. HTN: BP controlled.  4. Hyperlipidemia: Continue Crestor and Zetia. Good lipids in 5/23.  5. PVCs: Frequent in past, may contribute to cardiomyopathy.  7/22 Zio monitor on amiodarone showed PVC percentage down to 1.8%.  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  He is now on mexiletine with rare palpitations.  - Continue mexiletine.   6. Suspect OSA: Daytime sleepiness and snoring.  He needs to redo the home sleep study.  7. 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  He is now off amiodarone and on methimazole.  - Continue followup with endocrinology.  8. Osteoarthritis: Would try to avoid ibuprofen and use Tylenol as much as possible.   Followup in 4 months with APP.   Loralie Champagne 10/06/2022

## 2022-10-23 DIAGNOSIS — L570 Actinic keratosis: Secondary | ICD-10-CM | POA: Diagnosis not present

## 2022-10-23 DIAGNOSIS — Z85828 Personal history of other malignant neoplasm of skin: Secondary | ICD-10-CM | POA: Diagnosis not present

## 2022-10-23 DIAGNOSIS — L820 Inflamed seborrheic keratosis: Secondary | ICD-10-CM | POA: Diagnosis not present

## 2022-10-23 DIAGNOSIS — D485 Neoplasm of uncertain behavior of skin: Secondary | ICD-10-CM | POA: Diagnosis not present

## 2022-10-23 DIAGNOSIS — C44629 Squamous cell carcinoma of skin of left upper limb, including shoulder: Secondary | ICD-10-CM | POA: Diagnosis not present

## 2022-11-08 ENCOUNTER — Other Ambulatory Visit: Payer: Self-pay | Admitting: Cardiovascular Disease

## 2022-11-16 DIAGNOSIS — I5042 Chronic combined systolic (congestive) and diastolic (congestive) heart failure: Secondary | ICD-10-CM | POA: Diagnosis not present

## 2022-11-16 DIAGNOSIS — Z79899 Other long term (current) drug therapy: Secondary | ICD-10-CM | POA: Diagnosis not present

## 2022-11-16 DIAGNOSIS — R7303 Prediabetes: Secondary | ICD-10-CM | POA: Diagnosis not present

## 2022-11-16 DIAGNOSIS — I2510 Atherosclerotic heart disease of native coronary artery without angina pectoris: Secondary | ICD-10-CM | POA: Diagnosis not present

## 2022-11-16 DIAGNOSIS — E0590 Thyrotoxicosis, unspecified without thyrotoxic crisis or storm: Secondary | ICD-10-CM | POA: Diagnosis not present

## 2023-01-04 ENCOUNTER — Other Ambulatory Visit (HOSPITAL_COMMUNITY): Payer: Self-pay | Admitting: Cardiology

## 2023-01-11 DIAGNOSIS — H35443 Age-related reticular degeneration of retina, bilateral: Secondary | ICD-10-CM | POA: Diagnosis not present

## 2023-01-11 DIAGNOSIS — H43813 Vitreous degeneration, bilateral: Secondary | ICD-10-CM | POA: Diagnosis not present

## 2023-01-11 DIAGNOSIS — H35373 Puckering of macula, bilateral: Secondary | ICD-10-CM | POA: Diagnosis not present

## 2023-01-11 DIAGNOSIS — Z961 Presence of intraocular lens: Secondary | ICD-10-CM | POA: Diagnosis not present

## 2023-01-22 DIAGNOSIS — C44319 Basal cell carcinoma of skin of other parts of face: Secondary | ICD-10-CM | POA: Diagnosis not present

## 2023-01-22 DIAGNOSIS — D225 Melanocytic nevi of trunk: Secondary | ICD-10-CM | POA: Diagnosis not present

## 2023-01-22 DIAGNOSIS — D045 Carcinoma in situ of skin of trunk: Secondary | ICD-10-CM | POA: Diagnosis not present

## 2023-01-22 DIAGNOSIS — Z85828 Personal history of other malignant neoplasm of skin: Secondary | ICD-10-CM | POA: Diagnosis not present

## 2023-01-22 DIAGNOSIS — D0462 Carcinoma in situ of skin of left upper limb, including shoulder: Secondary | ICD-10-CM | POA: Diagnosis not present

## 2023-01-22 DIAGNOSIS — L821 Other seborrheic keratosis: Secondary | ICD-10-CM | POA: Diagnosis not present

## 2023-02-01 NOTE — Progress Notes (Incomplete)
PCP: Jonathon Jordan, MD Cardiology: Dr. Tamala Julian HF Cardiology: Dr. Aundra Dubin  82 y.o. with PAD, smoking, and a long history of cardiomyopathy was referred by Dr. Tamala Julian for evaluation of CHF/cardiomyopathy.  Patient was diagnosed with a cardiomyopathy prior to 2004 while he was living in [place].  He had an ICD placed in 2004.  Per notes, EF was < 30% during this time.  He thinks he may have had a heart catheterization but does not remember the details.  The ICD subsequently malfunctioned and was turned off in 2009.  It is still implanted and has 2 nonfunctioning leads.  Echo in 2011 after moving the [facility] showed improved EF at 40-45%.  He did not have an echo over the next 9 years.  However, Echo done in 7/20 showed EF down to 20-25% with a moderately dilated LV.  He has been subsequently started on HF meds.  He saw Dr. Lovena Le to decide about explantation of old ICD/implantation of new ICD.  He was thought to be not a candidate for a new intravascular ICD.  Subcutaneous ICD would be a potential option but utility would like be limited with nonischemic cardiomyopathy at age 38.   Patient also has an extensive PAD history.  In 9/20 he had PTCA to left external and common iliac arteries.    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  He was well-compensated.   Peripheral angiography in 5/21 with stent to the right external iliac artery.   He had presumed diverticular bleeding in 10/21.   Holter in 3/22 with very frequent PVCs, 21% total beats.  Amiodarone was started.  Repeat Zio monitor in 7/22 showed PVCs down to 1.8%.   Echo in 4/22 showed EF 25-30%, mild LV enlargement, normal RV.    In 1/23, he had balloon angioplasty of right CIA.   Echo in 9/23 showed EF 30-35%, mildly decreased RV systolic function, normal IVC.   He returns today for followup of CHF.  Still smoking.  Right calf claudication has improved dramatically since his PTCA and is now minimal.  He  now has mild claudication in his left calf.  He takes occasional ibuprofen for left hip pain.  No chest pain or exertional dyspnea.  He does feel like he tires more easily than in the past.  No palpitations or lightheadedness.  Not getting much exercise.  Weight up 2 lbs.   Labs (2/20): LDL 93 Labs (11/20): K 4, creatinine 0.99  Labs (1/21): LDL 89 Labs (2/21): K 4.2, creatinine 0.97 Labs (4/21): LDL 51, HDL 50, K 3.7, creatinine 0.92 Labs (6/21): K 4.5, creatinine 0.86 Labs (10/21): K 4.3, creatinine 1.3, hgb 14.4 Labs (3/22): LDL 50 Labs (4/22): K 4.1, creatinine 1.08 Labs (7/22): K 4.4, creatinine 1.13, hgb 15.2, TSH normal, LFTs normal Labs (1/23): K 4.7, creatinine 1.17 Labs (5/23): TSH low, LDL 50, K 3.7, creatinine 1.06, LFTs normal Labs (9/23): K 4, creatinine 1.04  PMH:  1. HTN 2. Hyperlipidemia 3. Active smoker 4. PAD: Right popliteal stent in 2014 with subsequent occlusion.   - Orbital atherectomy + drug coated balloon angioplasty of left SFA in 2019.   - In 9/20, had PTCA left external and common iliac arteries.  - Peripheral angiogram in 5/21 with stent to right external iliac artery.  - 1/22 peripheral arterial dopplers with significant bilateral iliac disease.  - 7/22 peripheral arterial dopplers with moderate stenosis right EIA stent.  - 1/23 PTCA to right CIA.  - ABIs (  9/23): right ABI 0.58, left ABI 0.84; patent right iliac stent.  5. CKD: Stage 3.  6. Cardiomyopathy: Patient had an ICD placed in [place] in 2004, so presumably had a pre-dating cardiomyopathy.  He thinks that he had a heart cath in [place] but is not sure.  EF <30% when he moved to [place].  - Echo (2011) with EF improved to 40-45%.  - Echo (7/20): EF 20-25%, moderate LV dilation, normal RV size and systolic function.  - Patient has 2 nonfunctioning ICD leads (ICD is not functional, turned off in 2009).  - LHC/RHC (2/21): Nonobstructive CAD.  Mean RA 3, PA 25/5, mean PCWP 7, CI 2.4.  - Echo  (4/22): EF 25-30%, mild LV enlargement, normal RV. - Echo (9/23): EF 30-35%, mildly decreased RV systolic function, normal IVC. 7. Type 2 diabetes 8. Diverticular bleeding in 10/21.  9. PVCs: Zio patch 3/22 showed 21% PVCs with 15 short NSVT runs.  - Zio monitor (7/22): 1.8% PVCs on amiodarone.  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   SH: Retired 15 years ago from [employer]  Married with 3 children, smokes about 1/2-1 ppd still, has had about 3 drinks/day for 2-3 years.   FH: Mother with cancer, father with COPD.  No heart disease that he knows of.   ROS: All systems reviewed and negative except as per HPI.   Current Outpatient Medications  Medication Sig Dispense Refill   carvedilol (COREG) 12.5 MG tablet Take 1 tablet (12.5 mg total) by mouth 2 (two) times daily. 180 tablet 3   clopidogrel (PLAVIX) 75 MG tablet Take 1 tablet (75 mg total) by mouth daily with breakfast. 30 tablet 11   diphenhydramine-acetaminophen (TYLENOL PM) 25-500 MG TABS tablet Take 2 tablets by mouth at bedtime.     ENTRESTO 97-103 MG TAKE 1 TABLET BY MOUTH TWICE A DAY 60 tablet 11   eplerenone (INSPRA) 50 MG tablet Take 1 tablet (50 mg total) by mouth daily. 90 tablet 3   ezetimibe (ZETIA) 10 MG tablet TAKE 1 TABLET BY MOUTH EVERY DAY 90 tablet 2   Ibuprofen (ADVIL PO) Take 1 tablet by mouth as needed.     JARDIANCE 10 MG TABS tablet TAKE 1 TABLET BY MOUTH EVERY DAY BEFORE BREAKFAST 30 tablet 6   methimazole (TAPAZOLE) 5 MG tablet Take 5 mg by mouth 2 (two) times daily.     mexiletine (MEXITIL) 150 MG capsule Take 1 capsule (150 mg total) by mouth 2 (two) times daily. 180 capsule 3   mirabegron ER (MYRBETRIQ) 50 MG TB24 tablet Take 50 mg by mouth daily.     Multiple Vitamins-Minerals (ONE-A-DAY MENS 50+ ADVANTAGE PO) Take 1 tablet by mouth daily.     PARoxetine (PAXIL) 40 MG tablet Take 40 mg by mouth daily.     rosuvastatin (CRESTOR) 40 MG tablet Take 1 tablet (40 mg total) by mouth daily.  90 tablet 3   sodium chloride (OCEAN) 0.65 % SOLN nasal spray Place 1 spray into both nostrils daily as needed for congestion.     venlafaxine XR (EFFEXOR-XR) 75 MG 24 hr capsule Take 75 mg by mouth daily.     No current facility-administered medications for this visit.   There were no vitals taken for this visit. General: NAD Neck: No JVD, no thyromegaly or thyroid nodule.  Lungs: Clear to auscultation bilaterally with normal respiratory effort. CV: Nondisplaced PMI.  Heart regular S1/S2, no S3/S4, no murmur.  No peripheral edema.  No carotid bruit.  Difficult  to palpate pedal pulses.  Abdomen: Soft, nontender, no hepatosplenomegaly, no distention.  Skin: Intact without lesions or rashes.  Neurologic: Alert and oriented x 3.  Psych: Normal affect. Extremities: No clubbing or cyanosis.  HEENT: Normal.   Assessment/Plan: 1. Chronic systolic CHF:  Patient has had a known nonischemic cardiomyopathy since prior to 2004.  EF had improved to 40-45% in 2011 but had dropped significantly on echo in 7/20 with EF 20-25%. He has a nonfunctioning ICD.  Cause of the cardiomyopathy is uncertain.  Coronary angiography in 2/21 showed no significant coronary disease. He has no family history of cardiomyopathy.  RHC in 2/21 showed normal filling pressures and normal cardiac output.  Echo in 4/22 with EF still low at 25-30%. PVCs likely contributed to his cardiomyopathy, Zio patch in 3/22 with 21% PVCs.  Echo in 9/23 showed EF 30-35%, mildly decreased RV systolic function, normal IVC.  NYHA class I-II symptoms, not volume overloaded on exam.  - Continue lower dose of Coreg, 12.5 mg bid, due to orthostatic symptoms.  - Continue eplerenone 50 mg daily. BMET today.   - Continue Entresto 97/103 bid.   - Continue Jardiance 10 mg daily.  - He does not appear to need Lasix.  - Patient is not a candidate for another intravascular ICD.  Limited utility to implanting subcutaneous ICD in the setting of a nonischemic  cardiomyopathy with advanced age.   2. PAD: Significant improvement since R CIA PTCA in 1/23.  Unfortunately, still smoking.  He still has some claudication left calf.  - Stay active.  - Continue Plavix 75 and Crestor 40 mg daily + Zetia 10 mg daily.  - Followup with Dr. Kirke Corin for PV.  - Needs to quit smoking => he has been unable to do this.  3. HTN: BP controlled.  4. Hyperlipidemia: Continue Crestor and Zetia. Good lipids in 5/23.  5. PVCs: Frequent in past, may contribute to cardiomyopathy.  7/22 Zio monitor on amiodarone showed PVC percentage down to 1.8%.  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  He is now on mexiletine with rare palpitations.  - Continue mexiletine.   6. Suspect OSA: Daytime sleepiness and snoring.  He needs to redo the home sleep study.  7. 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  He is now off amiodarone and on methimazole.  - Continue followup with endocrinology.  8. Osteoarthritis: Would try to avoid ibuprofen and use Tylenol as much as possible.   Followup in 4 months with APP.   Nicholas Herrera [facility] 02/01/2023

## 2023-02-04 ENCOUNTER — Encounter (HOSPITAL_COMMUNITY): Payer: Self-pay

## 2023-02-04 ENCOUNTER — Ambulatory Visit (HOSPITAL_COMMUNITY)
Admission: RE | Admit: 2023-02-04 | Discharge: 2023-02-04 | Disposition: A | Discharge status: Home / Self Care | Payer: Medicare Other | Source: Ambulatory Visit | Attending: Family Medicine | Admitting: Family Medicine

## 2023-02-04 VITALS — BP 110/70 | HR 57 | Wt 149.0 lb

## 2023-02-04 DIAGNOSIS — I130 Hypertensive heart and chronic kidney disease with heart failure and stage 1 through stage 4 chronic kidney disease, or unspecified chronic kidney disease: Secondary | ICD-10-CM | POA: Diagnosis not present

## 2023-02-04 DIAGNOSIS — I493 Ventricular premature depolarization: Secondary | ICD-10-CM

## 2023-02-04 DIAGNOSIS — I428 Other cardiomyopathies: Secondary | ICD-10-CM | POA: Insufficient documentation

## 2023-02-04 DIAGNOSIS — E1151 Type 2 diabetes mellitus with diabetic peripheral angiopathy without gangrene: Secondary | ICD-10-CM | POA: Diagnosis not present

## 2023-02-04 DIAGNOSIS — M1990 Unspecified osteoarthritis, unspecified site: Secondary | ICD-10-CM

## 2023-02-04 DIAGNOSIS — E782 Mixed hyperlipidemia: Secondary | ICD-10-CM | POA: Diagnosis not present

## 2023-02-04 DIAGNOSIS — I739 Peripheral vascular disease, unspecified: Secondary | ICD-10-CM | POA: Diagnosis not present

## 2023-02-04 DIAGNOSIS — M79661 Pain in right lower leg: Secondary | ICD-10-CM | POA: Insufficient documentation

## 2023-02-04 DIAGNOSIS — E785 Hyperlipidemia, unspecified: Secondary | ICD-10-CM | POA: Insufficient documentation

## 2023-02-04 DIAGNOSIS — Z95810 Presence of automatic (implantable) cardiac defibrillator: Secondary | ICD-10-CM | POA: Diagnosis not present

## 2023-02-04 DIAGNOSIS — E0590 Thyrotoxicosis, unspecified without thyrotoxic crisis or storm: Secondary | ICD-10-CM | POA: Insufficient documentation

## 2023-02-04 DIAGNOSIS — I10 Essential (primary) hypertension: Secondary | ICD-10-CM

## 2023-02-04 DIAGNOSIS — R0600 Dyspnea, unspecified: Secondary | ICD-10-CM | POA: Insufficient documentation

## 2023-02-04 DIAGNOSIS — N183 Chronic kidney disease, stage 3 unspecified: Secondary | ICD-10-CM | POA: Diagnosis not present

## 2023-02-04 DIAGNOSIS — F17210 Nicotine dependence, cigarettes, uncomplicated: Secondary | ICD-10-CM | POA: Insufficient documentation

## 2023-02-04 DIAGNOSIS — R42 Dizziness and giddiness: Secondary | ICD-10-CM | POA: Diagnosis not present

## 2023-02-04 DIAGNOSIS — R0683 Snoring: Secondary | ICD-10-CM | POA: Insufficient documentation

## 2023-02-04 DIAGNOSIS — R7989 Other specified abnormal findings of blood chemistry: Secondary | ICD-10-CM | POA: Diagnosis not present

## 2023-02-04 DIAGNOSIS — Z7902 Long term (current) use of antithrombotics/antiplatelets: Secondary | ICD-10-CM | POA: Diagnosis not present

## 2023-02-04 DIAGNOSIS — Z79899 Other long term (current) drug therapy: Secondary | ICD-10-CM | POA: Diagnosis not present

## 2023-02-04 DIAGNOSIS — R29818 Other symptoms and signs involving the nervous system: Secondary | ICD-10-CM | POA: Diagnosis not present

## 2023-02-04 DIAGNOSIS — I5022 Chronic systolic (congestive) heart failure: Secondary | ICD-10-CM | POA: Insufficient documentation

## 2023-02-04 DIAGNOSIS — E1122 Type 2 diabetes mellitus with diabetic chronic kidney disease: Secondary | ICD-10-CM | POA: Insufficient documentation

## 2023-02-04 DIAGNOSIS — I2510 Atherosclerotic heart disease of native coronary artery without angina pectoris: Secondary | ICD-10-CM | POA: Insufficient documentation

## 2023-02-04 DIAGNOSIS — Z7984 Long term (current) use of oral hypoglycemic drugs: Secondary | ICD-10-CM | POA: Insufficient documentation

## 2023-02-04 LAB — BASIC METABOLIC PANEL
Anion gap: 8 (ref 5–15)
BUN: 13 mg/dL (ref 8–23)
CO2: 28 mmol/L (ref 22–32)
Calcium: 9 mg/dL (ref 8.9–10.3)
Chloride: 102 mmol/L (ref 98–111)
Creatinine, Ser: 0.94 mg/dL (ref 0.61–1.24)
GFR, Estimated: >60 mL/min (ref >60)
Glucose, Bld: 102 mg/dL — ABNORMAL HIGH (ref 70–99)
Potassium: 3.8 mmol/L (ref 3.5–5.1)
Sodium: 138 mmol/L (ref 135–145)

## 2023-02-04 NOTE — Addendum Note (Signed)
Encounter addended by: Jacklynn Ganong, FNP on: 02/04/2023 1:56 PM  Actions taken: Clinical Note Signed

## 2023-02-04 NOTE — Patient Instructions (Signed)
Thank you for coming in today  Labs were done today, if any labs are abnormal the clinic will call you No news is good news  PLEASE COMPLETE HOME SLEEP STUDY   Medications: No changes   Follow up appointments:  Your physician recommends that you schedule a follow-up appointment in:  4 months With Dr. Earlean Shawl will receive a reminder letter in the mail a few months in advance. If you don't receive a letter, please call our office to schedule the follow-up appointment.     Do the following things EVERYDAY: Weigh yourself in the morning before breakfast. Write it down and keep it in a log. Take your medicines as prescribed Eat low salt foods--Limit salt (sodium) to 2000 mg per day.  Stay as active as you can everyday Limit all fluids for the day to less than 2 liters   At the [facility], you and your health needs are our priority. As part of our continuing mission to provide you with exceptional heart care, we have created designated Provider Care Teams. These Care Teams include your primary Cardiologist (physician) and Advanced Practice Providers (APPs- Physician Assistants and Nurse Practitioners) who all work together to provide you with the care you need, when you need it.   You may see any of the following providers on your designated Care Team at your next follow up: Dr Arvilla Meres Dr Marca Ancona Dr. Marcos Eke, NP Robbie Lis, [place] [facility] [place], [place] Brynda Peon, NP Karle Plumber, PharmD   Please be sure to bring in all your medications bottles to every appointment.    Thank you for choosing  [facility]  If you have any questions or concerns before your next appointment please send [place] a message through [place] or call our office at [phone].    TO LEAVE A MESSAGE FOR THE NURSE SELECT OPTION 2, PLEASE LEAVE A MESSAGE INCLUDING: YOUR NAME DATE OF BIRTH CALL  BACK NUMBER REASON FOR CALL**this is important as we prioritize the call backs  YOU WILL RECEIVE A CALL BACK THE SAME DAY AS LONG AS YOU CALL BEFORE 4:00 PM

## 2023-02-21 ENCOUNTER — Other Ambulatory Visit: Payer: Self-pay | Admitting: *Deleted

## 2023-02-21 MED ORDER — CARVEDILOL 12.5 MG PO TABS: 12.5000 mg | ORAL_TABLET | Freq: Two times a day (BID) | ORAL | 3 refills | Status: DC

## 2023-02-27 DIAGNOSIS — E1151 Type 2 diabetes mellitus with diabetic peripheral angiopathy without gangrene: Secondary | ICD-10-CM | POA: Diagnosis not present

## 2023-02-27 DIAGNOSIS — I110 Hypertensive heart disease with heart failure: Secondary | ICD-10-CM | POA: Diagnosis not present

## 2023-02-27 DIAGNOSIS — Z0000 Encounter for general adult medical examination without abnormal findings: Secondary | ICD-10-CM | POA: Diagnosis not present

## 2023-02-27 DIAGNOSIS — E0590 Thyrotoxicosis, unspecified without thyrotoxic crisis or storm: Secondary | ICD-10-CM | POA: Diagnosis not present

## 2023-02-27 DIAGNOSIS — I739 Peripheral vascular disease, unspecified: Secondary | ICD-10-CM | POA: Diagnosis not present

## 2023-02-27 DIAGNOSIS — I2510 Atherosclerotic heart disease of native coronary artery without angina pectoris: Secondary | ICD-10-CM | POA: Diagnosis not present

## 2023-02-27 DIAGNOSIS — K589 Irritable bowel syndrome without diarrhea: Secondary | ICD-10-CM | POA: Diagnosis not present

## 2023-02-27 DIAGNOSIS — I5042 Chronic combined systolic (congestive) and diastolic (congestive) heart failure: Secondary | ICD-10-CM | POA: Diagnosis not present

## 2023-02-27 DIAGNOSIS — F17210 Nicotine dependence, cigarettes, uncomplicated: Secondary | ICD-10-CM | POA: Diagnosis not present

## 2023-02-27 DIAGNOSIS — Z79899 Other long term (current) drug therapy: Secondary | ICD-10-CM | POA: Diagnosis not present

## 2023-02-27 DIAGNOSIS — Z950 Presence of cardiac pacemaker: Secondary | ICD-10-CM | POA: Diagnosis not present

## 2023-02-27 DIAGNOSIS — E1169 Type 2 diabetes mellitus with other specified complication: Secondary | ICD-10-CM | POA: Diagnosis not present

## 2023-03-04 ENCOUNTER — Other Ambulatory Visit: Payer: Self-pay | Admitting: Family Medicine

## 2023-03-04 DIAGNOSIS — F17200 Nicotine dependence, unspecified, uncomplicated: Secondary | ICD-10-CM

## 2023-03-04 DIAGNOSIS — M810 Age-related osteoporosis without current pathological fracture: Secondary | ICD-10-CM

## 2023-03-05 ENCOUNTER — Encounter: Payer: Self-pay | Admitting: Cardiovascular Disease

## 2023-03-05 ENCOUNTER — Other Ambulatory Visit: Payer: Self-pay | Admitting: Family Medicine

## 2023-03-05 DIAGNOSIS — Z13820 Encounter for screening for osteoporosis: Secondary | ICD-10-CM

## 2023-03-05 DIAGNOSIS — F17200 Nicotine dependence, unspecified, uncomplicated: Secondary | ICD-10-CM

## 2023-04-03 ENCOUNTER — Ambulatory Visit (INDEPENDENT_AMBULATORY_CARE_PROVIDER_SITE_OTHER): Payer: Medicare Other

## 2023-04-03 DIAGNOSIS — Z13820 Encounter for screening for osteoporosis: Secondary | ICD-10-CM

## 2023-04-03 DIAGNOSIS — F17200 Nicotine dependence, unspecified, uncomplicated: Secondary | ICD-10-CM

## 2023-04-03 DIAGNOSIS — M810 Age-related osteoporosis without current pathological fracture: Secondary | ICD-10-CM | POA: Diagnosis not present

## 2023-04-30 DIAGNOSIS — D485 Neoplasm of uncertain behavior of skin: Secondary | ICD-10-CM | POA: Diagnosis not present

## 2023-04-30 DIAGNOSIS — L570 Actinic keratosis: Secondary | ICD-10-CM | POA: Diagnosis not present

## 2023-04-30 DIAGNOSIS — C44329 Squamous cell carcinoma of skin of other parts of face: Secondary | ICD-10-CM | POA: Diagnosis not present

## 2023-05-01 ENCOUNTER — Other Ambulatory Visit (HOSPITAL_COMMUNITY): Payer: Self-pay | Admitting: Cardiology

## 2023-05-02 ENCOUNTER — Other Ambulatory Visit (HOSPITAL_COMMUNITY): Payer: Self-pay | Admitting: Cardiology

## 2023-06-12 DIAGNOSIS — C44329 Squamous cell carcinoma of skin of other parts of face: Secondary | ICD-10-CM | POA: Diagnosis not present

## 2023-06-20 ENCOUNTER — Other Ambulatory Visit (HOSPITAL_COMMUNITY): Payer: Self-pay | Admitting: Cardiology

## 2023-07-12 ENCOUNTER — Ambulatory Visit (HOSPITAL_BASED_OUTPATIENT_CLINIC_OR_DEPARTMENT_OTHER)
Admission: RE | Admit: 2023-07-12 | Discharge: 2023-07-12 | Disposition: A | Discharge status: Home / Self Care | Payer: Medicare Other | Source: Ambulatory Visit | Attending: Cardiovascular Disease | Admitting: Cardiovascular Disease

## 2023-07-12 ENCOUNTER — Ambulatory Visit (HOSPITAL_COMMUNITY)
Admission: RE | Admit: 2023-07-12 | Discharge: 2023-07-12 | Disposition: A | Discharge status: Home / Self Care | Payer: Medicare Other | Source: Ambulatory Visit | Attending: Cardiology | Admitting: Cardiology

## 2023-07-12 DIAGNOSIS — I739 Peripheral vascular disease, unspecified: Secondary | ICD-10-CM | POA: Diagnosis not present

## 2023-07-12 DIAGNOSIS — Z95820 Peripheral vascular angioplasty status with implants and grafts: Secondary | ICD-10-CM

## 2023-07-14 LAB — VAS US ABI WITH/WO TBI
Left ABI: 0.6
Right ABI: 0.66

## 2023-07-30 ENCOUNTER — Ambulatory Visit: Payer: Medicare Other | Attending: Cardiovascular Disease | Admitting: Cardiovascular Disease

## 2023-07-30 ENCOUNTER — Encounter: Payer: Self-pay | Admitting: Cardiovascular Disease

## 2023-07-30 VITALS — BP 114/60 | HR 73 | Ht 69.0 in | Wt 158.0 lb

## 2023-07-30 DIAGNOSIS — Z720 Tobacco use: Secondary | ICD-10-CM

## 2023-07-30 DIAGNOSIS — E785 Hyperlipidemia, unspecified: Secondary | ICD-10-CM

## 2023-07-30 DIAGNOSIS — I739 Peripheral vascular disease, unspecified: Secondary | ICD-10-CM

## 2023-07-30 DIAGNOSIS — I2510 Atherosclerotic heart disease of native coronary artery without angina pectoris: Secondary | ICD-10-CM

## 2023-07-30 DIAGNOSIS — I10 Essential (primary) hypertension: Secondary | ICD-10-CM

## 2023-07-30 DIAGNOSIS — I5022 Chronic systolic (congestive) heart failure: Secondary | ICD-10-CM | POA: Diagnosis not present

## 2023-07-30 NOTE — Patient Instructions (Signed)
Medication Instructions:  No changes *If you need a refill on your cardiac medications before your next appointment, please call your pharmacy*   Lab Work: None ordered If you have labs (blood work) drawn today and your tests are completely normal, you will receive your results only by: MyChart Message (if you have MyChart) OR A paper copy in the mail If you have any lab test that is abnormal or we need to change your treatment, we will call you to review the results.   Testing/Procedures: None ordered   Follow-Up: At [facility], you and your health needs are our priority.  As part of our continuing mission to provide you with exceptional heart care, we have created designated Provider Care Teams.  These Care Teams include your primary Cardiologist (physician) and Advanced Practice Providers (APPs -  Physician Assistants and Nurse Practitioners) who all work together to provide you with the care you need, when you need it.  We recommend signing up for the patient portal called "MyChart".  Sign up information is provided on this After Visit Summary.  MyChart is used to connect with patients for Virtual Visits (Telemedicine).  Patients are able to view lab/test results, encounter notes, upcoming appointments, etc.  Non-urgent messages can be sent to your provider as well.   To learn more about what you can do with MyChart, go to [email or web address].    Your next appointment:   6 month(s)  Provider:   Dr. Arida  

## 2023-07-30 NOTE — Progress Notes (Signed)
Cardiology Office Note   Date:  07/30/2023   ID:  Nicholas Herrera, DOB Jul 23, 1941, MRN [id number]  PCP:  Mila Palmer, MD  Cardiologist: Dr. Shirlee Latch  No chief complaint on file.      History of Present Illness: Nicholas Herrera is a 82 y.o. male who is here today for a follow-up visit regarding peripheral arterial disease. He has known history of coronary artery disease, chronic systolic/diastolic heart failure, tobacco use and hypertension.  He is on amiodarone therapy for frequent PVCs. He has extensive peripheral arterial disease.  He has known occluded stent in the right proximal popliteal artery.  He is status post left SFA atherectomy and drug-coated balloon angioplasty in 2019, left common iliac and external iliac artery drug-coated balloon angioplasty in 2020.  He is also status post orbital atherectomy and balloon expandable stent placement to the right common iliac artery as well as angioplasty and drug-eluting stent placement to the right external iliac artery.  He had previous GI bleed due to diverticulosis.  Most recent angiography in March 2023 showed patent right common iliac artery stent with severe restenosis, moderate left external iliac artery stenosis and occluded distal right SFA stent with collaterals and three-vessel runoff below the knee.  I performed successful drug-coated balloon angioplasty to the right common iliac artery with good results.    Most recent Doppler studies in September showed a drop in ABI on the left to 0.60 from previously of 0.84.  Right ABI was stable at 0.66.  Left common iliac artery was not well-visualized. He reports mostly left hip discomfort that he thinks is due to arthritis.  He does have bilateral calf claudication that is worse on the left than the right but symptoms are overall mild. He cut down on tobacco use but has not been able to quit.  Past Medical History:  Diagnosis Date   Abdominal distention    Abdominal pain     Bronchitis    hx of   Cancer (HCC)    skin   CHF (congestive heart failure) (HCC)     (hx of EF 27%) s/p AICD 2004. Most recent LVEF > 40% , 8/11   Chronic kidney disease    has small stones, no treatment   Cough    Depression    takes paxil   Diverticulosis    hx of   Dysrhythmia    Easy bruising    Hematuria    hx of   Hernia october 2012   [facility]   HTN (hypertension)    Hyperlipidemia    takes zocor   Inguinal hernia    Left groin pain     Past Surgical History:  Procedure Laterality Date   ABDOMINAL AORTOGRAM W/LOWER EXTREMITY N/A 09/03/2018   Procedure: ABDOMINAL AORTOGRAM W/LOWER EXTREMITY;  Surgeon: Iran Ouch, MD;  [facility]: [facility];  Service: Cardiovascular;  Laterality: N/A;   ABDOMINAL AORTOGRAM W/LOWER EXTREMITY N/A 07/29/2019   Procedure: ABDOMINAL AORTOGRAM W/LOWER EXTREMITY;  Surgeon: Iran Ouch, MD;  Location: [facility];  Service: Cardiovascular;  Laterality: N/A;   ABDOMINAL AORTOGRAM W/LOWER EXTREMITY Bilateral 03/23/2020   Procedure: ABDOMINAL AORTOGRAM W/LOWER EXTREMITY;  Surgeon: Iran Ouch, MD;  Location: [facility];  Service: Cardiovascular;  Laterality: Bilateral;   ABDOMINAL AORTOGRAM W/LOWER EXTREMITY N/A 11/22/2021   Procedure: ABDOMINAL AORTOGRAM W/LOWER EXTREMITY;  Surgeon: Iran Ouch, MD;  Location: [facility];  Service: Cardiovascular;  Laterality: N/A;  APPENDECTOMY     CARDIAC CATHETERIZATION     CARDIAC DEFIBRILLATOR REMOVAL  2005   HERNIA REPAIR  09/11/11   repair of [facility]    INGUINAL HERNIA REPAIR  09/11/2011   Procedure: HERNIA REPAIR INGUINAL ADULT;  Surgeon: Rulon Abide, DO;  Location: [facility] OR;  Service: General;  Laterality: Left;  open left inguinal hernia with mesh   LOWER EXTREMITY ANGIOGRAM N/A 02/05/2013   Procedure: LOWER EXTREMITY ANGIOGRAM;  Surgeon: Corky Crafts, MD;  Location: [facility] CATH LAB;  Service: Cardiovascular;  Laterality: N/A;   PERIPHERAL VASCULAR  BALLOON ANGIOPLASTY  07/29/2019   Procedure: PERIPHERAL VASCULAR BALLOON ANGIOPLASTY;  Surgeon: Iran Ouch, MD;  [facility]: [facility];  Service: Cardiovascular;;   PERIPHERAL VASCULAR BALLOON ANGIOPLASTY  11/22/2021   Procedure: PERIPHERAL VASCULAR BALLOON ANGIOPLASTY;  Surgeon: Iran Ouch, MD;  Location: [facility];  Service: Cardiovascular;;   PERIPHERAL VASCULAR INTERVENTION Left 09/03/2018   Procedure: PERIPHERAL VASCULAR INTERVENTION;  Surgeon: Iran Ouch, MD;  [facility]: [facility];  Service: Cardiovascular;  Laterality: Left;   PERIPHERAL VASCULAR INTERVENTION Right 03/23/2020   Procedure: PERIPHERAL VASCULAR INTERVENTION;  Surgeon: Iran Ouch, MD;  [facility]: [facility];  Service: Cardiovascular;  Laterality: Right;   RIGHT/LEFT HEART CATH AND CORONARY ANGIOGRAPHY N/A 12/15/2019   Procedure: RIGHT/LEFT HEART CATH AND CORONARY ANGIOGRAPHY;  Surgeon: Lyn Records, MD;  Location: [facility];  Service: Cardiovascular;  Laterality: N/A;     Current Outpatient Medications  Medication Sig Dispense Refill   acetaminophen (TYLENOL) 650 MG CR tablet Take 650 mg by mouth 2 (two) times daily.     Calcium 200 MG TABS Take 200 mg by mouth daily. 1000 mg IV-VO     carvedilol (COREG) 12.5 MG tablet Take 1 tablet (12.5 mg total) by mouth 2 (two) times daily. 180 tablet 3   clopidogrel (PLAVIX) 75 MG tablet Take 1 tablet (75 mg total) by mouth daily with breakfast. 30 tablet 11   diphenhydramine-acetaminophen (TYLENOL PM) 25-500 MG TABS tablet Take 2 tablets by mouth at bedtime.     ENTRESTO 97-103 MG TAKE 1 TABLET BY MOUTH TWICE A DAY 60 tablet 11   eplerenone (INSPRA) 50 MG tablet TAKE 1 TABLET BY MOUTH EVERY DAY 90 tablet 3   ezetimibe (ZETIA) 10 MG tablet TAKE 1 TABLET BY MOUTH EVERY DAY 90 tablet 2   FOSAMAX 70 MG tablet Take 70 mg by mouth once a week.     JARDIANCE 10 MG TABS tablet TAKE 1 TABLET BY MOUTH EVERY DAY BEFORE BREAKFAST  30 tablet 6   mexiletine (MEXITIL) 150 MG capsule TAKE 1 CAPSULE BY MOUTH TWICE A DAY 180 capsule 3   mirabegron ER (MYRBETRIQ) 50 MG TB24 tablet Take 50 mg by mouth daily.     Multiple Vitamins-Minerals (ONE-A-DAY MENS 50+ ADVANTAGE PO) Take 1 tablet by mouth daily.     PARoxetine (PAXIL) 40 MG tablet Take 40 mg by mouth daily.     rosuvastatin (CRESTOR) 40 MG tablet Take 1 tablet (40 mg total) by mouth daily. 90 tablet 3   sodium chloride (OCEAN) 0.65 % SOLN nasal spray Place 1 spray into both nostrils daily as needed for congestion.     venlafaxine XR (EFFEXOR-XR) 75 MG 24 hr capsule Take 75 mg by mouth daily.     Ibuprofen (ADVIL PO) Take 1 tablet by mouth as needed.     No current facility-administered medications for this visit.  Allergies:   Spironolactone and Sulfa antibiotics    Social History:  The patient  reports that he has been smoking cigars and cigarettes. He has a 48 pack-year smoking history. He has never used smokeless tobacco. He reports current alcohol use of about 7.0 standard drinks of alcohol per week. He reports that he does not use drugs.   Family History:  The patient's family history includes Cancer in his mother.    ROS:  Please see the history of present illness.   Otherwise, review of systems are positive for none.   All other systems are reviewed and negative.    PHYSICAL EXAM: VS:  BP 114/60 (BP Location: Left Arm, Patient Position: Sitting, Cuff Size: Normal)   Pulse 73   Ht 5\' 9"  (1.753 m)   Wt 158 lb (71.7 kg)   SpO2 92%   BMI 23.33 kg/m  , BMI Body mass index is 23.33 kg/m. GEN: Well nourished, well developed, in no acute distress  HEENT: normal  Neck: no JVD, carotid bruits, or masses Cardiac: RRR; no murmurs, rubs, or gallops,no edema  Respiratory:  clear to auscultation bilaterally, normal work of breathing GI: soft, nontender, nondistended, + BS MS: no deformity or atrophy  Skin: warm and dry, no rash Neuro:  Strength and sensation  are intact Psych: euthymic mood, full affect Vascular: Femoral pulse: +1 bilaterally  EKG:  EKG is not ordered today.   Recent Labs: 02/04/2023: BUN 13; Creatinine, Ser 0.94; Potassium 3.8; Sodium 138    Lipid Panel    Component Value Date/Time   CHOL 119 03/01/2022 1206   CHOL 115 02/19/2020 0914   TRIG 64 03/01/2022 1206   HDL 56 03/01/2022 1206   HDL 50 02/19/2020 0914   CHOLHDL 2.1 03/01/2022 1206   VLDL 13 03/01/2022 1206   LDLCALC 50 03/01/2022 1206   LDLCALC 51 02/19/2020 0914      Wt Readings from Last 3 Encounters:  07/30/23 158 lb (71.7 kg)  02/04/23 149 lb (67.6 kg)  10/04/22 147 lb (66.7 kg)           No data to display            ASSESSMENT AND PLAN:   1.  Peripheral arterial disease: Status post bilateral endovascular intervention on iliac arteries with known chronically occluded right SFA/popliteal artery stent.   His left ABI has decreased to the moderate range with suggestion of possible left common iliac artery occlusion.  However, I am not convinced given that he still has a palpable pulse in the left femoral artery.  It is possible that he has progression of left SFA/popliteal disease. Regardless, his symptoms do not seem to be lifestyle limiting and I recommend continuing medical therapy for now with close observation.    2. Tobacco use: I again discussed the importance of smoking cessation.  He cut down on tobacco use but has not been able to quit completely.  3. Hyperlipidemia: I reviewed most recent lipid profile which showed an LDL of 62. Continue treatment with rosuvastatin and Zetia.   4. Coronary artery disease: No anginal symptoms.    5. Chronic systolic heart failure: He is followed by the heart failure clinic .  He is currently on optimal medical therapy.    6.  Essential hypertension: Blood pressure is reasonably controlled on current medications.     Disposition:   Follow-up in 6 months.   Signed,  Lorine Bears, MD   07/30/2023 9:45 AM    [facility]

## 2023-08-02 ENCOUNTER — Other Ambulatory Visit: Payer: Self-pay | Admitting: Cardiovascular Disease

## 2023-08-02 NOTE — Telephone Encounter (Signed)
Refill Request.  

## 2023-08-08 DIAGNOSIS — L821 Other seborrheic keratosis: Secondary | ICD-10-CM | POA: Diagnosis not present

## 2023-08-08 DIAGNOSIS — D1801 Hemangioma of skin and subcutaneous tissue: Secondary | ICD-10-CM | POA: Diagnosis not present

## 2023-08-08 DIAGNOSIS — D0439 Carcinoma in situ of skin of other parts of face: Secondary | ICD-10-CM | POA: Diagnosis not present

## 2023-08-08 DIAGNOSIS — Z85828 Personal history of other malignant neoplasm of skin: Secondary | ICD-10-CM | POA: Diagnosis not present

## 2023-08-08 DIAGNOSIS — C44629 Squamous cell carcinoma of skin of left upper limb, including shoulder: Secondary | ICD-10-CM | POA: Diagnosis not present

## 2023-08-08 DIAGNOSIS — C44329 Squamous cell carcinoma of skin of other parts of face: Secondary | ICD-10-CM | POA: Diagnosis not present

## 2023-08-16 DIAGNOSIS — R3915 Urgency of urination: Secondary | ICD-10-CM | POA: Diagnosis not present

## 2023-08-16 DIAGNOSIS — R350 Frequency of micturition: Secondary | ICD-10-CM | POA: Diagnosis not present

## 2023-08-19 ENCOUNTER — Other Ambulatory Visit (HOSPITAL_COMMUNITY): Payer: Self-pay | Admitting: Cardiovascular Disease

## 2023-08-19 DIAGNOSIS — I739 Peripheral vascular disease, unspecified: Secondary | ICD-10-CM

## 2023-08-29 ENCOUNTER — Other Ambulatory Visit (HOSPITAL_COMMUNITY): Payer: Self-pay | Admitting: Cardiology

## 2023-08-29 MED ORDER — EMPAGLIFLOZIN 10 MG PO TABS: 10.0000 mg | ORAL_TABLET | Freq: Every day | ORAL | 6 refills | Status: DC

## 2023-09-05 DIAGNOSIS — E1169 Type 2 diabetes mellitus with other specified complication: Secondary | ICD-10-CM | POA: Diagnosis not present

## 2023-09-05 DIAGNOSIS — E1151 Type 2 diabetes mellitus with diabetic peripheral angiopathy without gangrene: Secondary | ICD-10-CM | POA: Diagnosis not present

## 2023-09-05 DIAGNOSIS — M25552 Pain in left hip: Secondary | ICD-10-CM | POA: Diagnosis not present

## 2023-09-05 DIAGNOSIS — M25551 Pain in right hip: Secondary | ICD-10-CM | POA: Diagnosis not present

## 2023-09-12 ENCOUNTER — Ambulatory Visit (HOSPITAL_COMMUNITY)
Admission: RE | Admit: 2023-09-12 | Discharge: 2023-09-12 | Disposition: A | Discharge status: Home / Self Care | Payer: Medicare Other | Source: Ambulatory Visit | Attending: Cardiology | Admitting: Cardiology

## 2023-09-12 ENCOUNTER — Encounter (HOSPITAL_COMMUNITY): Payer: Self-pay | Admitting: Cardiology

## 2023-09-12 VITALS — BP 124/82 | HR 66 | Wt 157.6 lb

## 2023-09-12 DIAGNOSIS — I493 Ventricular premature depolarization: Secondary | ICD-10-CM | POA: Insufficient documentation

## 2023-09-12 DIAGNOSIS — I428 Other cardiomyopathies: Secondary | ICD-10-CM | POA: Diagnosis not present

## 2023-09-12 DIAGNOSIS — E1122 Type 2 diabetes mellitus with diabetic chronic kidney disease: Secondary | ICD-10-CM | POA: Diagnosis not present

## 2023-09-12 DIAGNOSIS — Z7902 Long term (current) use of antithrombotics/antiplatelets: Secondary | ICD-10-CM | POA: Diagnosis not present

## 2023-09-12 DIAGNOSIS — I5042 Chronic combined systolic (congestive) and diastolic (congestive) heart failure: Secondary | ICD-10-CM | POA: Diagnosis not present

## 2023-09-12 DIAGNOSIS — Z79899 Other long term (current) drug therapy: Secondary | ICD-10-CM | POA: Insufficient documentation

## 2023-09-12 DIAGNOSIS — F17210 Nicotine dependence, cigarettes, uncomplicated: Secondary | ICD-10-CM | POA: Insufficient documentation

## 2023-09-12 DIAGNOSIS — E0590 Thyrotoxicosis, unspecified without thyrotoxic crisis or storm: Secondary | ICD-10-CM | POA: Diagnosis not present

## 2023-09-12 DIAGNOSIS — N183 Chronic kidney disease, stage 3 unspecified: Secondary | ICD-10-CM | POA: Diagnosis not present

## 2023-09-12 DIAGNOSIS — Z7984 Long term (current) use of oral hypoglycemic drugs: Secondary | ICD-10-CM | POA: Diagnosis not present

## 2023-09-12 DIAGNOSIS — R002 Palpitations: Secondary | ICD-10-CM | POA: Insufficient documentation

## 2023-09-12 DIAGNOSIS — I739 Peripheral vascular disease, unspecified: Secondary | ICD-10-CM

## 2023-09-12 DIAGNOSIS — Z95810 Presence of automatic (implantable) cardiac defibrillator: Secondary | ICD-10-CM | POA: Diagnosis not present

## 2023-09-12 DIAGNOSIS — I130 Hypertensive heart and chronic kidney disease with heart failure and stage 1 through stage 4 chronic kidney disease, or unspecified chronic kidney disease: Secondary | ICD-10-CM | POA: Insufficient documentation

## 2023-09-12 DIAGNOSIS — I5022 Chronic systolic (congestive) heart failure: Secondary | ICD-10-CM

## 2023-09-12 LAB — LIPID PANEL
Cholesterol: 132 mg/dL (ref 0–200)
HDL: 57 mg/dL (ref >40)
LDL Cholesterol: 64 mg/dL (ref 0–99)
Total CHOL/HDL Ratio: 2.3 {ratio}
Triglycerides: 57 mg/dL (ref <150)
VLDL: 11 mg/dL (ref 0–40)

## 2023-09-12 LAB — BASIC METABOLIC PANEL
Anion gap: 8 (ref 5–15)
BUN: 13 mg/dL (ref 8–23)
CO2: 27 mmol/L (ref 22–32)
Calcium: 9 mg/dL (ref 8.9–10.3)
Chloride: 101 mmol/L (ref 98–111)
Creatinine, Ser: 1.46 mg/dL — ABNORMAL HIGH (ref 0.61–1.24)
GFR, Estimated: 48 mL/min — ABNORMAL LOW (ref >60)
Glucose, Bld: 110 mg/dL — ABNORMAL HIGH (ref 70–99)
Potassium: 4.1 mmol/L (ref 3.5–5.1)
Sodium: 136 mmol/L (ref 135–145)

## 2023-09-12 LAB — BRAIN NATRIURETIC PEPTIDE: B Natriuretic Peptide: 175.9 pg/mL — ABNORMAL HIGH (ref 0.0–100.0)

## 2023-09-12 NOTE — Addendum Note (Signed)
Encounter addended by: Laurey Morale, MD on: 09/12/2023 8:15 PM  Actions taken: Clinical Note Signed, Level of Service modified, Visit diagnoses modified

## 2023-09-12 NOTE — Patient Instructions (Signed)
PLEASE MAKE SURE YOU ARE TAKING YOUR MEXILETINE Twice daily  Labs done today, your results will be available in MyChart, we will contact you for abnormal readings.  Your physician has requested that you have an echocardiogram. Echocardiography is a painless test that uses sound waves to create images of your heart. It provides your doctor with information about the size and shape of your heart and how well your heart's chambers and valves are working. This procedure takes approximately one hour. There are no restrictions for this procedure. Please do NOT wear cologne, perfume, aftershave, or lotions (deodorant is allowed). Please arrive 15 minutes prior to your appointment time.  Please note: We ask at that you not bring children with you during ultrasound (echo/ vascular) testing. Due to room size and safety concerns, children are not allowed in the ultrasound rooms during exams. Our front office staff cannot provide observation of children in our lobby area while testing is being conducted. An adult accompanying a patient to their appointment will only be allowed in the ultrasound room at the discretion of the ultrasound technician under special circumstances. We apologize for any inconvenience.  Your physician recommends that you schedule a follow-up appointment in: 4 months   If you have any questions or concerns before your next appointment please send [place] a message through [place] or call our office at [phone].    TO LEAVE A MESSAGE FOR THE NURSE SELECT OPTION 2, PLEASE LEAVE A MESSAGE INCLUDING: YOUR NAME DATE OF BIRTH CALL BACK NUMBER REASON FOR CALL**this is important as we prioritize the call backs  YOU WILL RECEIVE A CALL BACK THE SAME DAY AS LONG AS YOU CALL BEFORE 4:00 PM  At the [facility], you and your health needs are our priority. As part of our continuing mission to provide you with exceptional heart care, we have created designated Provider Care Teams.  These Care Teams include your primary Cardiologist (physician) and Advanced Practice Providers (APPs- Physician Assistants and Nurse Practitioners) who all work together to provide you with the care you need, when you need it.   You may see any of the following providers on your designated Care Team at your next follow up: Dr Arvilla Meres Dr Marca Ancona Dr. Dorthula Nettles Dr. Clearnce Hasten Amy Filbert Schilder, NP Robbie Lis, [place] [facility] [place], [place] Brynda Peon, NP [place] Lee, NP Karle Plumber, PharmD   Please be sure to bring in all your medications bottles to every appointment.    Thank you for choosing [facility]

## 2023-09-12 NOTE — Progress Notes (Signed)
PCP: Mila Palmer, MD Cardiology: Dr. Katrinka Blazing HF Cardiology: Dr. Shirlee Latch  82 y.o. with PAD, smoking, and a long history of cardiomyopathy was referred by Dr. Katrinka Blazing for evaluation of CHF/cardiomyopathy.  Patient was diagnosed with a cardiomyopathy prior to 2004 while he was living in [place].  He had an ICD placed in 2004.  Per notes, EF was < 30% during this time.  He thinks he may have had a heart catheterization but does not remember the details.  The ICD subsequently malfunctioned and was turned off in 2009.  It is still implanted and has 2 nonfunctioning leads.  Echo in 2011 after moving the [facility] showed improved EF at 40-45%.  He did not have an echo over the next 9 years.  However, Echo done in 7/20 showed EF down to 20-25% with a moderately dilated LV.  He has been subsequently started on HF meds.  He saw Dr. Ladona Ridgel to decide about explantation of old ICD/implantation of new ICD.  He was thought to be not a candidate for a new intravascular ICD.  Subcutaneous ICD would be a potential option but utility would like be limited with nonischemic cardiomyopathy at age 65.   Patient also has an extensive PAD history.  In 9/20 he had PTCA to left external and common iliac arteries.    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  He was well-compensated.   Peripheral angiography in 5/21 with stent to the right external iliac artery.   He had presumed diverticular bleeding in 10/21.   Holter in 3/22 with very frequent PVCs, 21% total beats.  Amiodarone was started.  Repeat Zio monitor in 7/22 showed PVCs down to 1.8%.   Echo in 4/22 showed EF 25-30%, mild LV enlargement, normal RV.    In 1/23, he had balloon angioplasty of right CIA.   Echo in 9/23 showed EF 30-35%, mildly decreased RV systolic function, normal IVC.   Today he returns for HF follow up. He is having worsening bilateral hip pain, now using Celebrex.  He has calf claudication walking up a hill or  stairs, this is unchanged.  No pedal ulcerations or rest pain.  No chest pain.  No significant exertional dyspnea.  No orthopnea/PND.   ECG (personally reviewed): NSR, inferior TWIs  Labs (2/20): LDL 93 Labs (11/20): K 4, creatinine 0.99  Labs (1/21): LDL 89 Labs (2/21): K 4.2, creatinine 0.97 Labs (4/21): LDL 51, HDL 50, K 3.7, creatinine 9.56 Labs (6/21): K 4.5, creatinine 0.86 Labs (10/21): K 4.3, creatinine 1.3, hgb 14.4 Labs (3/22): LDL 50 Labs (4/22): K 4.1, creatinine 1.08 Labs (7/22): K 4.4, creatinine 1.13, hgb 15.2, TSH normal, LFTs normal Labs (1/23): K 4.7, creatinine 1.17 Labs (5/23): TSH low, LDL 50, K 3.7, creatinine 2.13, LFTs normal Labs (9/23): K 4, creatinine 1.04 Labs (5/24): K 4.3, creatinine 1.09, LFTs normal, hgb 14.2  PMH:  1. HTN 2. Hyperlipidemia 3. Active smoker 4. PAD: Right popliteal stent in 2014 with subsequent occlusion.   - Orbital atherectomy + drug coated balloon angioplasty of left SFA in 2019.   - In 9/20, had PTCA left external and common iliac arteries.  - Peripheral angiogram in 5/21 with stent to right external iliac artery.  - 1/22 peripheral arterial dopplers with significant bilateral iliac disease.  - 7/22 peripheral arterial dopplers with moderate stenosis right EIA stent.  - 1/23 PTCA to right CIA.  - ABIs (9/23): right ABI 0.58, left ABI 0.84; patent right iliac stent.  -  ABIs (9/24): right 0.66, left 0.6 5. CKD: Stage 3.  6. Cardiomyopathy: Patient had an ICD placed in [place] in 2004, so presumably had a pre-dating cardiomyopathy.  He thinks that he had a heart cath in [place] but is not sure.  EF <30% when he moved to [place].  - Echo (2011) with EF improved to 40-45%.  - Echo (7/20): EF 20-25%, moderate LV dilation, normal RV size and systolic function.  - Patient has 2 nonfunctioning ICD leads (ICD is not functional, turned off in 2009).  - LHC/RHC (2/21): Nonobstructive CAD.  Mean RA 3, PA 25/5, mean PCWP 7, CI 2.4.  -  Echo (4/22): EF 25-30%, mild LV enlargement, normal RV. - Echo (9/23): EF 30-35%, mildly decreased RV systolic function, normal IVC. 7. Type 2 diabetes 8. Diverticular bleeding in 10/21.  9. PVCs: Zio patch 3/22 showed 21% PVCs with 15 short NSVT runs.  - Zio monitor (7/22): 1.8% PVCs on amiodarone.  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   SH: Retired 15 years ago from [employer]  Married with 3 children, smokes about 1/2-1 ppd still, has had about 3 drinks/day for 2-3 years.   FH: Mother with cancer, father with COPD.  No heart disease that he knows of.   ROS: All systems reviewed and negative except as per HPI.   Current Outpatient Medications  Medication Sig Dispense Refill   Calcium 200 MG TABS Take 200 mg by mouth daily. 1000 mg IV-VO     carvedilol (COREG) 12.5 MG tablet Take 1 tablet (12.5 mg total) by mouth 2 (two) times daily. 180 tablet 3   celecoxib (CELEBREX) 50 MG capsule Take 200 mg by mouth daily.     clopidogrel (PLAVIX) 75 MG tablet Take 1 tablet (75 mg total) by mouth daily with breakfast. 30 tablet 11   diphenhydramine-acetaminophen (TYLENOL PM) 25-500 MG TABS tablet Take 2 tablets by mouth at bedtime.     empagliflozin (JARDIANCE) 10 MG TABS tablet Take 1 tablet (10 mg total) by mouth daily. 30 tablet 6   ENTRESTO 97-103 MG TAKE 1 TABLET BY MOUTH TWICE A DAY 60 tablet 11   eplerenone (INSPRA) 50 MG tablet TAKE 1 TABLET BY MOUTH EVERY DAY 90 tablet 3   ezetimibe (ZETIA) 10 MG tablet TAKE 1 TABLET BY MOUTH EVERY DAY 90 tablet 3   FOSAMAX 70 MG tablet Take 70 mg by mouth once a week.     methimazole (TAPAZOLE) 5 MG tablet Take 5 mg by mouth daily.     mexiletine (MEXITIL) 150 MG capsule TAKE 1 CAPSULE BY MOUTH TWICE A DAY 180 capsule 3   mirabegron ER (MYRBETRIQ) 50 MG TB24 tablet Take 50 mg by mouth daily.     Multiple Vitamins-Minerals (ONE-A-DAY MENS 50+ ADVANTAGE PO) Take 1 tablet by mouth daily.     PARoxetine (PAXIL) 40 MG tablet Take 40 mg  by mouth daily.     rosuvastatin (CRESTOR) 40 MG tablet Take 1 tablet (40 mg total) by mouth daily. 90 tablet 3   sodium chloride (OCEAN) 0.65 % SOLN nasal spray Place 1 spray into both nostrils daily as needed for congestion.     venlafaxine XR (EFFEXOR-XR) 75 MG 24 hr capsule Take 75 mg by mouth daily.     No current facility-administered medications for this encounter.   Wt Readings from Last 3 Encounters:  09/12/23 71.5 kg (157 lb 9.6 oz)  07/30/23 71.7 kg (158 lb)  02/04/23 67.6 kg (149 lb)   BP  124/82   Pulse 66   Wt 71.5 kg (157 lb 9.6 oz)   SpO2 97%   BMI 23.27 kg/m  General: NAD Neck: No JVD, no thyromegaly or thyroid nodule.  Lungs: Clear to auscultation bilaterally with normal respiratory effort. CV: Nondisplaced PMI.  Heart regular S1/S2, no S3/S4, no murmur.  No peripheral edema.  No carotid bruit.  Unable to palpate pedal pulses.  Abdomen: Soft, nontender, no hepatosplenomegaly, no distention.  Skin: Intact without lesions or rashes.  Neurologic: Alert and oriented x 3.  Psych: Normal affect. Extremities: No clubbing or cyanosis.  HEENT: Normal.   Assessment/Plan: 1. Chronic systolic CHF:  Patient has had a known nonischemic cardiomyopathy since prior to 2004.  EF had improved to 40-45% in 2011 but had dropped significantly on echo in 7/20 with EF 20-25%. He has a nonfunctioning ICD.  Cause of the cardiomyopathy is uncertain.  Coronary angiography in 2/21 showed no significant coronary disease. He has no family history of cardiomyopathy.  RHC in 2/21 showed normal filling pressures and normal cardiac output.  Echo in 4/22 with EF still low at 25-30%. PVCs likely contributed to his cardiomyopathy, Zio patch in 3/22 with 21% PVCs.  Echo in 9/23 showed EF 30-35%, mildly decreased RV systolic function, normal IVC.  NYHA class II symptoms; he is not volume overloaded on exam.  - Continue lower dose of Coreg, 12.5 mg bid, due to orthostatic symptoms.  - Continue eplerenone 50  mg daily. BMET/BNP today.   - Continue Entresto 97/103 bid.   - Continue Jardiance 10 mg daily.  - He does not appear to need Lasix.  - I will arrange for repeat echo.  - Patient is not a candidate for another intravascular ICD.  Limited utility to implanting subcutaneous ICD in the setting of a nonischemic cardiomyopathy with advanced age.   2. PAD:  R CIA PTCA in 1/23.  Unfortunately, still smoking.  ABIs in 9/24 were mildly worse.  No rest pain or pedal ulcerations.  Symptoms are stable.  - Stay active, walk through pain at least a short distance.  - Continue Plavix 75  - Crestor 40 mg daily + Zetia 10 mg daily. Check lipids today.  - Followup with Dr. Kirke Corin for PV.  - Needs to quit smoking => he has been unable to do this.  3. HTN: BP controlled.  4. Hyperlipidemia: Continue Crestor and Zetia. Good lipids in 5/23.  5. PVCs: Frequent in past, may contribute to cardiomyopathy.  7/22 Zio monitor on amiodarone showed PVC percentage down to 1.8%.  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  He is now on mexiletine with rare palpitations.  - Continue mexiletine.   6. 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  He is now off amiodarone and taking methimazole.  - Continue followup with endocrinology.  7. Osteoarthritis: Hip pain, try to limit Celebrex as much as possible.   Follow up in 4 months with APP  Marca Ancona  09/12/2023

## 2023-09-19 DIAGNOSIS — M25552 Pain in left hip: Secondary | ICD-10-CM | POA: Diagnosis not present

## 2023-09-19 DIAGNOSIS — M25551 Pain in right hip: Secondary | ICD-10-CM | POA: Diagnosis not present

## 2023-09-19 DIAGNOSIS — Z9181 History of falling: Secondary | ICD-10-CM | POA: Diagnosis not present

## 2023-09-19 DIAGNOSIS — M545 Low back pain, unspecified: Secondary | ICD-10-CM | POA: Diagnosis not present

## 2023-09-27 ENCOUNTER — Other Ambulatory Visit: Payer: Medicare Other

## 2023-10-04 DIAGNOSIS — M25552 Pain in left hip: Secondary | ICD-10-CM | POA: Diagnosis not present

## 2023-10-04 DIAGNOSIS — M5451 Vertebrogenic low back pain: Secondary | ICD-10-CM | POA: Diagnosis not present

## 2023-10-04 DIAGNOSIS — M25652 Stiffness of left hip, not elsewhere classified: Secondary | ICD-10-CM | POA: Diagnosis not present

## 2023-10-04 DIAGNOSIS — M25551 Pain in right hip: Secondary | ICD-10-CM | POA: Diagnosis not present

## 2023-10-07 DIAGNOSIS — M25551 Pain in right hip: Secondary | ICD-10-CM | POA: Diagnosis not present

## 2023-10-07 DIAGNOSIS — M25652 Stiffness of left hip, not elsewhere classified: Secondary | ICD-10-CM | POA: Diagnosis not present

## 2023-10-07 DIAGNOSIS — M5451 Vertebrogenic low back pain: Secondary | ICD-10-CM | POA: Diagnosis not present

## 2023-10-07 DIAGNOSIS — M25552 Pain in left hip: Secondary | ICD-10-CM | POA: Diagnosis not present

## 2023-10-09 DIAGNOSIS — M25551 Pain in right hip: Secondary | ICD-10-CM | POA: Diagnosis not present

## 2023-10-09 DIAGNOSIS — M25652 Stiffness of left hip, not elsewhere classified: Secondary | ICD-10-CM | POA: Diagnosis not present

## 2023-10-09 DIAGNOSIS — M5451 Vertebrogenic low back pain: Secondary | ICD-10-CM | POA: Diagnosis not present

## 2023-10-09 DIAGNOSIS — M25552 Pain in left hip: Secondary | ICD-10-CM | POA: Diagnosis not present

## 2023-10-16 DIAGNOSIS — M25551 Pain in right hip: Secondary | ICD-10-CM | POA: Diagnosis not present

## 2023-10-16 DIAGNOSIS — M5451 Vertebrogenic low back pain: Secondary | ICD-10-CM | POA: Diagnosis not present

## 2023-10-16 DIAGNOSIS — M25552 Pain in left hip: Secondary | ICD-10-CM | POA: Diagnosis not present

## 2023-10-16 DIAGNOSIS — M25652 Stiffness of left hip, not elsewhere classified: Secondary | ICD-10-CM | POA: Diagnosis not present

## 2023-10-21 DIAGNOSIS — M5451 Vertebrogenic low back pain: Secondary | ICD-10-CM | POA: Diagnosis not present

## 2023-10-21 DIAGNOSIS — M25551 Pain in right hip: Secondary | ICD-10-CM | POA: Diagnosis not present

## 2023-10-21 DIAGNOSIS — M25552 Pain in left hip: Secondary | ICD-10-CM | POA: Diagnosis not present

## 2023-10-21 DIAGNOSIS — M25652 Stiffness of left hip, not elsewhere classified: Secondary | ICD-10-CM | POA: Diagnosis not present

## 2023-10-24 ENCOUNTER — Ambulatory Visit (HOSPITAL_COMMUNITY)
Admission: RE | Admit: 2023-10-24 | Discharge: 2023-10-24 | Disposition: A | Discharge status: Home / Self Care | Payer: Medicare Other | Source: Ambulatory Visit | Attending: *Deleted | Admitting: *Deleted

## 2023-10-24 ENCOUNTER — Encounter: Payer: Self-pay | Admitting: *Deleted

## 2023-10-24 DIAGNOSIS — I371 Nonrheumatic pulmonary valve insufficiency: Secondary | ICD-10-CM | POA: Insufficient documentation

## 2023-10-24 DIAGNOSIS — I110 Hypertensive heart disease with heart failure: Secondary | ICD-10-CM | POA: Diagnosis not present

## 2023-10-24 DIAGNOSIS — E785 Hyperlipidemia, unspecified: Secondary | ICD-10-CM | POA: Diagnosis not present

## 2023-10-24 DIAGNOSIS — I5042 Chronic combined systolic (congestive) and diastolic (congestive) heart failure: Secondary | ICD-10-CM | POA: Diagnosis not present

## 2023-10-24 DIAGNOSIS — F17200 Nicotine dependence, unspecified, uncomplicated: Secondary | ICD-10-CM | POA: Insufficient documentation

## 2023-10-24 DIAGNOSIS — Z006 Encounter for examination for normal comparison and control in clinical research program: Secondary | ICD-10-CM

## 2023-10-24 DIAGNOSIS — I509 Heart failure, unspecified: Secondary | ICD-10-CM | POA: Diagnosis not present

## 2023-10-24 LAB — ECHOCARDIOGRAM COMPLETE
AR max vel: 1.91 cm2
AV Area VTI: 1.96 cm2
AV Area mean vel: 1.78 cm2
AV Mean grad: 2.5 mm[Hg]
AV Peak grad: 4.6 mm[Hg]
Ao pk vel: 1.08 m/s
Calc EF: 45.1 %
S' Lateral: 4.2 cm
Single Plane A2C EF: 39.8 %
Single Plane A4C EF: 49.1 %

## 2023-10-25 DIAGNOSIS — M25551 Pain in right hip: Secondary | ICD-10-CM | POA: Diagnosis not present

## 2023-10-25 DIAGNOSIS — M5451 Vertebrogenic low back pain: Secondary | ICD-10-CM | POA: Diagnosis not present

## 2023-10-25 DIAGNOSIS — M25552 Pain in left hip: Secondary | ICD-10-CM | POA: Diagnosis not present

## 2023-10-25 DIAGNOSIS — M25652 Stiffness of left hip, not elsewhere classified: Secondary | ICD-10-CM | POA: Diagnosis not present

## 2023-10-25 NOTE — Research (Signed)
SITE: 050     Subject #[id number]    Subprotocol: A  Inclusion Criteria  Patients who meet all of the following criteria are eligible for enrollment as study participants:  Yes No  Age > 81 years old X   Eligible to wear Holter Study X    Exclusion Criteria  Patients who meet any of these criteria are not eligible for enrollment as study participants: Yes No  1. Receiving any mechanical (respiratory or circulatory) or renal support therapy at Screening or during Visit #1.  X  2.  Any other conditions that in the opinion of the investigators are likely to prevent compliance with the study protocol or pose a safety concern if the subject participates in the study.  X  3. Poor tolerance, namely susceptible to severe skin allergies from ECG adhesive patch application.  X   Protocol: REV H                                     Residential Zip code*  _272_ (First 3 digits ONLY)                                             [facility] Informed Consent   Subject Name: Nicholas Herrera  Subject met inclusion and exclusion criteria.  The informed consent form, study requirements and expectations were reviewed with the subject. Subject had opportunity to read consent and questions and concerns were addressed prior to the signing of the consent form.  The subject verbalized understanding of the trial requirements.  The subject agreed to participate in the [facility] EF ACT trial and signed the informed consent at 1035 on 10-24-2023.  The informed consent was obtained prior to performance of any protocol-specific procedures for the subject.  A copy of the signed informed consent was given to the subject and a copy was placed in the subject's medical record.   [facility] [facility]          Current Outpatient Medications:    Calcium 200 MG TABS, Take 200 mg by mouth daily. 1000 mg IV-VO, Disp: , Rfl:    carvedilol (COREG) 12.5 MG tablet, Take 1 tablet (12.5 mg total) by mouth 2 (two) times daily., Disp:  180 tablet, Rfl: 3   celecoxib (CELEBREX) 50 MG capsule, Take 200 mg by mouth daily., Disp: , Rfl:    clopidogrel (PLAVIX) 75 MG tablet, Take 1 tablet (75 mg total) by mouth daily with breakfast., Disp: 30 tablet, Rfl: 11   diphenhydramine-acetaminophen (TYLENOL PM) 25-500 MG TABS tablet, Take 2 tablets by mouth at bedtime., Disp: , Rfl:    empagliflozin (JARDIANCE) 10 MG TABS tablet, Take 1 tablet (10 mg total) by mouth daily., Disp: 30 tablet, Rfl: 6   ENTRESTO 97-103 MG, TAKE 1 TABLET BY MOUTH TWICE A DAY, Disp: 60 tablet, Rfl: 11   eplerenone (INSPRA) 50 MG tablet, TAKE 1 TABLET BY MOUTH EVERY DAY, Disp: 90 tablet, Rfl: 3   ezetimibe (ZETIA) 10 MG tablet, TAKE 1 TABLET BY MOUTH EVERY DAY, Disp: 90 tablet, Rfl: 3   FOSAMAX 70 MG tablet, Take 70 mg by mouth once a week., Disp: , Rfl:    methimazole (TAPAZOLE) 5 MG tablet, Take 5 mg by mouth daily., Disp: , Rfl:    mexiletine (MEXITIL)  150 MG capsule, TAKE 1 CAPSULE BY MOUTH TWICE A DAY, Disp: 180 capsule, Rfl: 3   mirabegron ER (MYRBETRIQ) 50 MG TB24 tablet, Take 50 mg by mouth daily., Disp: , Rfl:    Multiple Vitamins-Minerals (ONE-A-DAY MENS 50+ ADVANTAGE PO), Take 1 tablet by mouth daily., Disp: , Rfl:    PARoxetine (PAXIL) 40 MG tablet, Take 40 mg by mouth daily., Disp: , Rfl:    rosuvastatin (CRESTOR) 40 MG tablet, Take 1 tablet (40 mg total) by mouth daily., Disp: 90 tablet, Rfl: 3   sodium chloride (OCEAN) 0.65 % SOLN nasal spray, Place 1 spray into both nostrils daily as needed for congestion., Disp: , Rfl:    venlafaxine XR (EFFEXOR-XR) 75 MG 24 hr capsule, Take 75 mg by mouth daily., Disp: , Rfl:

## 2023-10-28 DIAGNOSIS — M5451 Vertebrogenic low back pain: Secondary | ICD-10-CM | POA: Diagnosis not present

## 2023-10-28 DIAGNOSIS — M25652 Stiffness of left hip, not elsewhere classified: Secondary | ICD-10-CM | POA: Diagnosis not present

## 2023-10-28 DIAGNOSIS — M25552 Pain in left hip: Secondary | ICD-10-CM | POA: Diagnosis not present

## 2023-10-28 DIAGNOSIS — M25551 Pain in right hip: Secondary | ICD-10-CM | POA: Diagnosis not present

## 2023-10-31 DIAGNOSIS — M25551 Pain in right hip: Secondary | ICD-10-CM | POA: Diagnosis not present

## 2023-10-31 DIAGNOSIS — M25552 Pain in left hip: Secondary | ICD-10-CM | POA: Diagnosis not present

## 2023-11-07 DIAGNOSIS — M25551 Pain in right hip: Secondary | ICD-10-CM | POA: Diagnosis not present

## 2023-11-07 DIAGNOSIS — M25652 Stiffness of left hip, not elsewhere classified: Secondary | ICD-10-CM | POA: Diagnosis not present

## 2023-11-07 DIAGNOSIS — M25552 Pain in left hip: Secondary | ICD-10-CM | POA: Diagnosis not present

## 2023-11-07 DIAGNOSIS — M5451 Vertebrogenic low back pain: Secondary | ICD-10-CM | POA: Diagnosis not present

## 2023-11-08 DIAGNOSIS — M25552 Pain in left hip: Secondary | ICD-10-CM | POA: Diagnosis not present

## 2023-11-08 DIAGNOSIS — M25551 Pain in right hip: Secondary | ICD-10-CM | POA: Diagnosis not present

## 2023-11-08 DIAGNOSIS — M25652 Stiffness of left hip, not elsewhere classified: Secondary | ICD-10-CM | POA: Diagnosis not present

## 2023-11-08 DIAGNOSIS — M5451 Vertebrogenic low back pain: Secondary | ICD-10-CM | POA: Diagnosis not present

## 2023-12-12 DIAGNOSIS — M25552 Pain in left hip: Secondary | ICD-10-CM | POA: Diagnosis not present

## 2023-12-12 DIAGNOSIS — M25551 Pain in right hip: Secondary | ICD-10-CM | POA: Diagnosis not present

## 2024-01-09 ENCOUNTER — Telehealth (HOSPITAL_COMMUNITY): Payer: Self-pay

## 2024-01-09 NOTE — Telephone Encounter (Signed)
 Called and spoke to pt's wife Nicholas Herrera to confirm/remind patient of their appointment at the [facility] on 01/10/24.   Patient reminded to bring all medications and/or complete list.  Confirmed patient has transportation. Gave directions, instructed to utilize valet parking.  Confirmed appointment prior to ending call.

## 2024-01-10 ENCOUNTER — Ambulatory Visit (HOSPITAL_COMMUNITY)
Admission: RE | Admit: 2024-01-10 | Discharge: 2024-01-10 | Disposition: A | Discharge status: Home / Self Care | Payer: Medicare Other | Source: Ambulatory Visit | Attending: Family Medicine | Admitting: Family Medicine

## 2024-01-10 ENCOUNTER — Encounter (HOSPITAL_COMMUNITY): Payer: Self-pay

## 2024-01-10 VITALS — BP 140/88 | HR 68 | Ht 69.0 in | Wt 157.0 lb

## 2024-01-10 DIAGNOSIS — I2510 Atherosclerotic heart disease of native coronary artery without angina pectoris: Secondary | ICD-10-CM | POA: Insufficient documentation

## 2024-01-10 DIAGNOSIS — Z720 Tobacco use: Secondary | ICD-10-CM

## 2024-01-10 DIAGNOSIS — Z95810 Presence of automatic (implantable) cardiac defibrillator: Secondary | ICD-10-CM | POA: Insufficient documentation

## 2024-01-10 DIAGNOSIS — I10 Essential (primary) hypertension: Secondary | ICD-10-CM | POA: Diagnosis not present

## 2024-01-10 DIAGNOSIS — E0590 Thyrotoxicosis, unspecified without thyrotoxic crisis or storm: Secondary | ICD-10-CM | POA: Insufficient documentation

## 2024-01-10 DIAGNOSIS — Z79899 Other long term (current) drug therapy: Secondary | ICD-10-CM | POA: Insufficient documentation

## 2024-01-10 DIAGNOSIS — F17210 Nicotine dependence, cigarettes, uncomplicated: Secondary | ICD-10-CM | POA: Diagnosis not present

## 2024-01-10 DIAGNOSIS — I493 Ventricular premature depolarization: Secondary | ICD-10-CM

## 2024-01-10 DIAGNOSIS — I428 Other cardiomyopathies: Secondary | ICD-10-CM | POA: Insufficient documentation

## 2024-01-10 DIAGNOSIS — I5022 Chronic systolic (congestive) heart failure: Secondary | ICD-10-CM | POA: Diagnosis not present

## 2024-01-10 DIAGNOSIS — I130 Hypertensive heart and chronic kidney disease with heart failure and stage 1 through stage 4 chronic kidney disease, or unspecified chronic kidney disease: Secondary | ICD-10-CM | POA: Diagnosis not present

## 2024-01-10 DIAGNOSIS — M160 Bilateral primary osteoarthritis of hip: Secondary | ICD-10-CM | POA: Diagnosis not present

## 2024-01-10 DIAGNOSIS — I429 Cardiomyopathy, unspecified: Secondary | ICD-10-CM | POA: Diagnosis present

## 2024-01-10 DIAGNOSIS — Z95820 Peripheral vascular angioplasty status with implants and grafts: Secondary | ICD-10-CM | POA: Diagnosis not present

## 2024-01-10 DIAGNOSIS — E785 Hyperlipidemia, unspecified: Secondary | ICD-10-CM | POA: Diagnosis not present

## 2024-01-10 DIAGNOSIS — F17200 Nicotine dependence, unspecified, uncomplicated: Secondary | ICD-10-CM | POA: Diagnosis present

## 2024-01-10 DIAGNOSIS — M1990 Unspecified osteoarthritis, unspecified site: Secondary | ICD-10-CM

## 2024-01-10 DIAGNOSIS — I739 Peripheral vascular disease, unspecified: Secondary | ICD-10-CM | POA: Insufficient documentation

## 2024-01-10 LAB — CBC
HCT: 41.3 % (ref 39.0–52.0)
Hemoglobin: 14 g/dL (ref 13.0–17.0)
MCH: 32.7 pg (ref 26.0–34.0)
MCHC: 33.9 g/dL (ref 30.0–36.0)
MCV: 96.5 fL (ref 80.0–100.0)
Platelets: 219 10*3/uL (ref 150–400)
RBC: 4.28 MIL/uL (ref 4.22–5.81)
RDW: 13.1 % (ref 11.5–15.5)
WBC: 8.9 10*3/uL (ref 4.0–10.5)
nRBC: 0 % (ref 0.0–0.2)

## 2024-01-10 LAB — BASIC METABOLIC PANEL
Anion gap: 13 (ref 5–15)
BUN: 16 mg/dL (ref 8–23)
CO2: 26 mmol/L (ref 22–32)
Calcium: 9.3 mg/dL (ref 8.9–10.3)
Chloride: 100 mmol/L (ref 98–111)
Creatinine, Ser: 1.1 mg/dL (ref 0.61–1.24)
GFR, Estimated: >60 mL/min (ref >60)
Glucose, Bld: 111 mg/dL — ABNORMAL HIGH (ref 70–99)
Potassium: 4.2 mmol/L (ref 3.5–5.1)
Sodium: 139 mmol/L (ref 135–145)

## 2024-01-10 LAB — FERRITIN: Ferritin: 143 ng/mL (ref 24–336)

## 2024-01-10 LAB — IRON AND TIBC
Iron: 110 ug/dL (ref 45–182)
Saturation Ratios: 35 % (ref 17.9–39.5)
TIBC: 315 ug/dL (ref 250–450)
UIBC: 205 ug/dL

## 2024-01-10 LAB — BRAIN NATRIURETIC PEPTIDE: B Natriuretic Peptide: 75 pg/mL (ref 0.0–100.0)

## 2024-01-10 NOTE — Addendum Note (Signed)
 Encounter addended by: Howell Rucks, RDCS on: 01/10/2024 2:08 PM  Actions taken: Imaging Exam ended

## 2024-01-10 NOTE — Patient Instructions (Addendum)
 Thank you for coming in today  If you had labs drawn today, any labs that are abnormal the clinic will call you No news is good news  Medications: No changes  Follow up appointments:  Your physician recommends that you schedule a follow-up appointment in:  4 months With Dr. Shirlee Latch with echocardiogram  Please call our office to schedule the follow-up appointment in April/May 2025 .  Your physician has requested that you have an echocardiogram. Echocardiography is a painless test that uses sound waves to create images of your heart. It provides your doctor with information about the size and shape of your heart and how well your heart's chambers and valves are working. This procedure takes approximately one hour. There are no restrictions for this procedure.     Do the following things EVERYDAY: Weigh yourself in the morning before breakfast. Write it down and keep it in a log. Take your medicines as prescribed Eat low salt foods--Limit salt (sodium) to 2000 mg per day.  Stay as active as you can everyday Limit all fluids for the day to less than 2 liters   At the [facility], you and your health needs are our priority. As part of our continuing mission to provide you with exceptional heart care, we have created designated Provider Care Teams. These Care Teams include your primary Cardiologist (physician) and Advanced Practice Providers (APPs- Physician Assistants and Nurse Practitioners) who all work together to provide you with the care you need, when you need it.   You may see any of the following providers on your designated Care Team at your next follow up: Dr Arvilla Meres Dr Marca Ancona Dr. Marcos Eke, NP Robbie Lis, [place] [facility] [place], [place] Brynda Peon, NP Karle Plumber, PharmD   Please be sure to bring in all your medications bottles to every appointment.    Thank you for choosing [facility]  If you have any questions or concerns before your next appointment please send [place] a message through [place] or call our office at [phone].    TO LEAVE A MESSAGE FOR THE NURSE SELECT OPTION 2, PLEASE LEAVE A MESSAGE INCLUDING: YOUR NAME DATE OF BIRTH CALL BACK NUMBER REASON FOR CALL**this is important as we prioritize the call backs  YOU WILL RECEIVE A CALL BACK THE SAME DAY AS LONG AS YOU CALL BEFORE 4:00 PM

## 2024-01-10 NOTE — Progress Notes (Signed)
 PCP: Mila Palmer, MD Cardiology: Dr. Katrinka Blazing HF Cardiology: Dr. Shirlee Latch  83 y.o. with PAD, smoking, and a long history of cardiomyopathy. Patient was diagnosed with a cardiomyopathy prior to 2004 while he was living in [place].  He had an ICD placed in 2004.  Per notes, EF was < 30% during this time.  He thinks he may have had a heart catheterization but does not remember the details.  The ICD subsequently malfunctioned and was turned off in 2009.  It is still implanted and has 2 nonfunctioning leads.  Echo in 2011 after moving the [facility] showed improved EF at 40-45%.  He did not have an echo over the next 9 years.  However, Echo done in 7/20 showed EF down to 20-25% with a moderately dilated LV.  He has been subsequently started on HF meds.  He saw Dr. Ladona Ridgel to decide about explantation of old ICD/implantation of new ICD.  He was thought to be not a candidate for a new intravascular ICD.  Subcutaneous ICD would be a potential option but utility would like be limited with nonischemic cardiomyopathy at age 11.   Patient also has an extensive PAD history.  In 9/20 he had PTCA to left external and common iliac arteries.    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  He was well-compensated.   Peripheral angiography in 5/21 with stent to the right external iliac artery.   He had presumed diverticular bleeding in 10/21.   Holter in 3/22 with very frequent PVCs, 21% total beats.  Amiodarone was started.  Repeat Zio monitor in 7/22 showed PVCs down to 1.8%.   Echo in 4/22 showed EF 25-30%, mild LV enlargement, normal RV.    In 1/23, he had balloon angioplasty of right CIA.   Echo in 9/23 showed EF 30-35%, mildly decreased RV systolic function, normal IVC.   Echo 12/24 showed EF 20-25%, low/normal RV, moderate pulmonic valve regurgitation.  Today he returns for HF follow up. Overall feeling fine. 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 Taking acetaminophen & Celebrex for pain,  sees sports medicine and has received steroid injections.  No SOB walking on flat ground, can work in garden for 5-10 minutes before he gets fatigued and has to take a break. Does ok with ADLs. Denies palpitations, abnormal bleeding, CP, dizziness, edema, or PND/Orthopnea. Appetite ok. No fever or chills. Weight at home 157 pounds. Taking all medications. Smoking 1/2 ppd now. L leg calf pain with walking, but able to walk thru the pain.  ECG (personally reviewed): none ordered today.  Labs (9/23): K 4, creatinine 1.04 Labs (5/24): K 4.3, creatinine 1.09, LFTs normal, hgb 14.2 Labs (11/24): K 4.1, creatinine 1.46, LDL 64  PMH:  1. HTN 2. Hyperlipidemia 3. Active smoker 4. PAD: Right popliteal stent in 2014 with subsequent occlusion.   - Orbital atherectomy + drug coated balloon angioplasty of left SFA in 2019.   - In 9/20, had PTCA left external and common iliac arteries.  - Peripheral angiogram in 5/21 with stent to right external iliac artery.  - 1/22 peripheral arterial dopplers with significant bilateral iliac disease.  - 7/22 peripheral arterial dopplers with moderate stenosis right EIA stent.  - 1/23 PTCA to right CIA.  - ABIs (9/23): right ABI 0.58, left ABI 0.84; patent right iliac stent.  - ABIs (9/24): right 0.66, left 0.6 5. CKD: Stage 3.  6. Cardiomyopathy: Patient had an ICD placed in [place] in 2004, so presumably had a pre-dating  cardiomyopathy.  He thinks that he had a heart cath in [place] but is not sure.  EF <30% when he moved to [place].  - Echo (2011) with EF improved to 40-45%.  - Echo (7/20): EF 20-25%, moderate LV dilation, normal RV size and systolic function.  - Patient has 2 nonfunctioning ICD leads (ICD is not functional, turned off in 2009).  - LHC/RHC (2/21): Nonobstructive CAD.  Mean RA 3, PA 25/5, mean PCWP 7, CI 2.4.  - Echo (4/22): EF 25-30%, mild LV enlargement, normal RV. - Echo (9/23): EF 30-35%, mildly decreased RV systolic function, normal IVC. -  Echo (12/24): EF 20-25%, low/normal RV, moderate pulmonic valve regurgitation 7. Type 2 diabetes 8. Diverticular bleeding in 10/21.  9. PVCs: Zio patch 3/22 showed 21% PVCs with 15 short NSVT runs.  - Zio monitor (7/22): 1.8% PVCs on amiodarone.  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   SH: Retired 15 years ago from [employer]  Married with 3 children, smokes about 1/2-1 ppd still, has had about 3 drinks/day for 2-3 years.   FH: Mother with cancer, father with COPD.  No heart disease that he knows of.   ROS: All systems reviewed and negative except as per HPI.   Current Outpatient Medications  Medication Sig Dispense Refill   Calcium 200 MG TABS Take 200 mg by mouth daily. 1000 mg IV-VO     carvedilol (COREG) 12.5 MG tablet Take 1 tablet (12.5 mg total) by mouth 2 (two) times daily. 180 tablet 3   celecoxib (CELEBREX) 50 MG capsule Take 200 mg by mouth daily.     clopidogrel (PLAVIX) 75 MG tablet Take 1 tablet (75 mg total) by mouth daily with breakfast. 30 tablet 11   diphenhydramine-acetaminophen (TYLENOL PM) 25-500 MG TABS tablet Take 2 tablets by mouth at bedtime.     empagliflozin (JARDIANCE) 10 MG TABS tablet Take 1 tablet (10 mg total) by mouth daily. 30 tablet 6   ENTRESTO 97-103 MG TAKE 1 TABLET BY MOUTH TWICE A DAY 60 tablet 11   eplerenone (INSPRA) 50 MG tablet TAKE 1 TABLET BY MOUTH EVERY DAY 90 tablet 3   ezetimibe (ZETIA) 10 MG tablet TAKE 1 TABLET BY MOUTH EVERY DAY 90 tablet 3   FOSAMAX 70 MG tablet Take 70 mg by mouth once a week.     methimazole (TAPAZOLE) 5 MG tablet Take 5 mg by mouth daily.     mexiletine (MEXITIL) 150 MG capsule TAKE 1 CAPSULE BY MOUTH TWICE A DAY 180 capsule 3   mirabegron ER (MYRBETRIQ) 50 MG TB24 tablet Take 50 mg by mouth daily.     Multiple Vitamins-Minerals (ONE-A-DAY MENS 50+ ADVANTAGE PO) Take 1 tablet by mouth daily.     PARoxetine (PAXIL) 40 MG tablet Take 40 mg by mouth daily.     rosuvastatin (CRESTOR) 40 MG tablet  Take 1 tablet (40 mg total) by mouth daily. 90 tablet 3   sodium chloride (OCEAN) 0.65 % SOLN nasal spray Place 1 spray into both nostrils daily as needed for congestion.     venlafaxine XR (EFFEXOR-XR) 75 MG 24 hr capsule Take 75 mg by mouth daily.     No current facility-administered medications for this encounter.   Wt Readings from Last 3 Encounters:  01/10/24 71.2 kg (157 lb)  09/12/23 71.5 kg (157 lb 9.6 oz)  07/30/23 71.7 kg (158 lb)   BP (!) 140/88   Pulse 68   Ht 5\' 9"  (1.753 m)   Wt  71.2 kg (157 lb)   SpO2 96%   BMI 23.18 kg/m  Physical Exam General:  NAD. No resp difficulty, walked into clinic, elderly HEENT: Normal Neck: Supple. No JVD. Cor: Regular rate & rhythm. No rubs, gallops or murmurs. Lungs: Clear Abdomen: Soft, nontender, nondistended.  Extremities: No cyanosis, clubbing, rash, edema Neuro: Alert & oriented x 3, moves all 4 extremities w/o difficulty. Affect pleasant.  Assessment/Plan: 1. Chronic systolic CHF:  Patient has had a known nonischemic cardiomyopathy since prior to 2004.  EF had improved to 40-45% in 2011 but had dropped significantly on echo in 7/20 with EF 20-25%. He has a nonfunctioning ICD.  Cause of the cardiomyopathy is uncertain.  Coronary angiography in 2/21 showed no significant coronary disease. He has no family history of cardiomyopathy.  RHC in 2/21 showed normal filling pressures and normal cardiac output.  Echo in 4/22 with EF still low at 25-30%. PVCs likely contributed to his cardiomyopathy, Zio patch in 3/22 with 21% PVCs.  Echo in 9/23 showed EF 30-35%, mildly decreased RV systolic function, normal IVC. Echo 12/24 showed EF 20-25%, low/normal RV, moderate pulmonic valve regurgitation. NYHA class II-early III symptoms; he is not volume overloaded on exam.  - Continue Coreg 12.5 mg bid, orthostatic symptoms at higher doses  - Continue eplerenone 50 mg daily. BMET/BNP today.   - Continue Entresto 97/103 bid.   - Continue Jardiance 10 mg  daily. No GU symptoms. - He does not appear to need Lasix.  - Patient is not a candidate for another intravascular ICD.  Limited utility to implanting subcutaneous ICD in the setting of a nonischemic cardiomyopathy with advanced age.   - Last echo showed EF further reduced. No CP and he has compliant with GDMT. Will repeat echo in a few months to ensure EF remains stable. 2. PAD:  R CIA PTCA in 1/23.  Unfortunately, still smoking.  ABIs in 9/24 were mildly worse.  No rest pain or pedal ulcerations.  Symptoms are stable. He has follow up with Dr. Kirke Corin for [facility] - Stay active, walk through pain at least a short distance.  - Continue Plavix 75  - Crestor 40 mg daily + Zetia 10 mg daily. Good lipids 11/24. - Needs to quit smoking => he has been unable to do this. Has cut back to 1/2 ppd. 3. HTN: BP mildly elevated today but generally well-controlled at home - Continue current medications. 4. Hyperlipidemia: Continue Crestor and Zetia. Good lipids in 11/24 5. PVCs: Frequent in past, may contribute to cardiomyopathy.  7/22 Zio monitor on amiodarone showed PVC percentage down to 1.8%.  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  He is now on mexiletine with rare palpitations.  - Continue mexiletine.   6. 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  He is now off amiodarone and taking methimazole.  - Continue followup with endocrinology.  7. Osteoarthritis: Hip pain, try to limit Celebrex as much as possible.   Follow up in 4 months with Dr. Shirlee Latch + echo  Anderson Malta [facility] FNP-BC 01/10/2024

## 2024-01-14 DIAGNOSIS — H52223 Regular astigmatism, bilateral: Secondary | ICD-10-CM | POA: Diagnosis not present

## 2024-01-14 DIAGNOSIS — H43813 Vitreous degeneration, bilateral: Secondary | ICD-10-CM | POA: Diagnosis not present

## 2024-01-14 DIAGNOSIS — H35443 Age-related reticular degeneration of retina, bilateral: Secondary | ICD-10-CM | POA: Diagnosis not present

## 2024-01-14 DIAGNOSIS — Z961 Presence of intraocular lens: Secondary | ICD-10-CM | POA: Diagnosis not present

## 2024-01-14 DIAGNOSIS — H524 Presbyopia: Secondary | ICD-10-CM | POA: Diagnosis not present

## 2024-01-14 DIAGNOSIS — H35373 Puckering of macula, bilateral: Secondary | ICD-10-CM | POA: Diagnosis not present

## 2024-01-27 LAB — HM AWV

## 2024-01-28 ENCOUNTER — Other Ambulatory Visit: Payer: Self-pay | Admitting: Family Medicine

## 2024-02-04 ENCOUNTER — Encounter: Payer: Self-pay | Admitting: Cardiovascular Disease

## 2024-02-04 ENCOUNTER — Ambulatory Visit: Payer: Medicare Other | Attending: Cardiovascular Disease | Admitting: Cardiovascular Disease

## 2024-02-04 VITALS — BP 108/60 | HR 66 | Ht 69.0 in | Wt 156.8 lb

## 2024-02-04 DIAGNOSIS — I2510 Atherosclerotic heart disease of native coronary artery without angina pectoris: Secondary | ICD-10-CM

## 2024-02-04 DIAGNOSIS — E785 Hyperlipidemia, unspecified: Secondary | ICD-10-CM

## 2024-02-04 DIAGNOSIS — Z720 Tobacco use: Secondary | ICD-10-CM | POA: Diagnosis not present

## 2024-02-04 DIAGNOSIS — I10 Essential (primary) hypertension: Secondary | ICD-10-CM | POA: Diagnosis not present

## 2024-02-04 DIAGNOSIS — I739 Peripheral vascular disease, unspecified: Secondary | ICD-10-CM | POA: Diagnosis not present

## 2024-02-04 DIAGNOSIS — I5022 Chronic systolic (congestive) heart failure: Secondary | ICD-10-CM

## 2024-02-04 NOTE — Patient Instructions (Signed)
 Medication Instructions:  No changes *If you need a refill on your cardiac medications before your next appointment, please call your pharmacy*  Lab Work: None ordered If you have labs (blood work) drawn today and your tests are completely normal, you will receive your results only by: MyChart Message (if you have MyChart) OR A paper copy in the mail If you have any lab test that is abnormal or we need to change your treatment, we will call you to review the results.  Testing/Procedures: Your physician has requested that you have an Aorta/Iliac Duplex in September.   No food after 11PM the night before.  Water is OK. (Don't drink liquids if you have been instructed not to for ANOTHER test) Avoid foods that produce bowel gas, for 24 hours prior to exam (see below). No breakfast, no chewing gum, no smoking or carbonated beverages. Patient may take morning medications with water. Come in for test at least 15 minutes early to register.  Your physician has requested that you have an ankle brachial index (ABI) in September. During this test an ultrasound and blood pressure cuff are used to evaluate the arteries that supply the arms and legs with blood. Allow thirty minutes for this exam. There are no restrictions or special instructions.    Please note: We ask at that you not bring children with you during ultrasound (echo/ vascular) testing. Due to room size and safety concerns, children are not allowed in the ultrasound rooms during exams. Our front office staff cannot provide observation of children in our lobby area while testing is being conducted. An adult accompanying a patient to their appointment will only be allowed in the ultrasound room at the discretion of the ultrasound technician under special circumstances. We apologize for any inconvenience.    Follow-Up: At [facility], you and your health needs are our priority.  As part of our continuing mission to provide you with  exceptional heart care, our providers are all part of one team.  This team includes your primary Cardiologist (physician) and Advanced Practice Providers or APPs (Physician Assistants and Nurse Practitioners) who all work together to provide you with the care you need, when you need it.  Your next appointment:   5 months with Dr. Kirke Corin  We recommend signing up for the patient portal called "MyChart".  Sign up information is provided on this After Visit Summary.  MyChart is used to connect with patients for Virtual Visits (Telemedicine).  Patients are able to view lab/test results, encounter notes, upcoming appointments, etc.  Non-urgent messages can be sent to your provider as well.   To learn more about what you can do with MyChart, go to [email or web address].         1st Floor: - Lobby - Registration  - Pharmacy  - Lab - Cafe  2nd Floor: - PV Lab - Diagnostic Testing (echo, CT, nuclear med)  3rd Floor: - Vacant  4th Floor: - TCTS (cardiothoracic surgery) - AFib Clinic - Structural Heart Clinic - Vascular Surgery  - Vascular Ultrasound  5th Floor: - HeartCare Cardiology (general and EP) - Clinical Pharmacy for coumadin, hypertension, lipid, weight-loss medications, and med management appointments    Valet parking services will be available as well.

## 2024-02-04 NOTE — Progress Notes (Signed)
 Cardiology Office Note   Date:  02/04/2024   ID:  Nicholas Herrera, DOB 08-Feb-1941, MRN [id number]  PCP:  Nicholas Palmer, MD  Cardiologist: Dr. Shirlee Herrera  No chief complaint on file.      History of Present Illness: Nicholas Herrera is a 83 y.o. male who is here today for a follow-up visit regarding peripheral arterial disease. He has known history of coronary artery disease, chronic systolic/diastolic heart failure, tobacco use and hypertension.  He is on amiodarone therapy for frequent PVCs. He has extensive peripheral arterial disease.  He has known occluded stent in the right proximal popliteal artery.  He is status post left SFA atherectomy and drug-coated balloon angioplasty in 2019, left common iliac and external iliac artery drug-coated balloon angioplasty in 2020.  He is also status post orbital atherectomy and balloon expandable stent placement to the right common iliac artery as well as angioplasty and drug-eluting stent placement to the right external iliac artery.  He had previous GI bleed due to diverticulosis.  Most recent angiography in March 2023 showed patent right common iliac artery stent with severe restenosis, moderate left external iliac artery stenosis and occluded distal right SFA stent with collaterals and three-vessel runoff below the knee.  I performed successful drug-coated balloon angioplasty to the right common iliac artery .  Most recent Doppler studies in September showed a drop in ABI on the left to 0.60 from previously of 0.84.  Right ABI was stable at 0.66.  Left common iliac artery was not well-visualized.  He was severely limited by left hip arthritis that improved recently with steroid injection.  He does report left calf claudication that seems to be stable.  He has symptoms affecting the right calf as well but not as severe.  Past Medical History:  Diagnosis Date   Abdominal distention    Abdominal pain    Bronchitis    hx of   Cancer (HCC)     skin   CHF (congestive heart failure) (HCC)     (hx of EF 27%) s/p AICD 2004. Most recent LVEF > 40% , 8/11   Chronic kidney disease    has small stones, no treatment   Cough    Depression    takes paxil   Diverticulosis    hx of   Dysrhythmia    Easy bruising    Hematuria    hx of   Hernia october 2012   [facility]   HTN (hypertension)    Hyperlipidemia    takes zocor   Inguinal hernia    Left groin pain     Past Surgical History:  Procedure Laterality Date   ABDOMINAL AORTOGRAM W/LOWER EXTREMITY N/A 09/03/2018   Procedure: ABDOMINAL AORTOGRAM W/LOWER EXTREMITY;  Surgeon: Iran Ouch, MD;  Location: [facility] INVASIVE CV LAB;  Service: Cardiovascular;  Laterality: N/A;   ABDOMINAL AORTOGRAM W/LOWER EXTREMITY N/A 07/29/2019   Procedure: ABDOMINAL AORTOGRAM W/LOWER EXTREMITY;  Surgeon: Iran Ouch, MD;  Location: [facility] INVASIVE CV LAB;  Service: Cardiovascular;  Laterality: N/A;   ABDOMINAL AORTOGRAM W/LOWER EXTREMITY Bilateral 03/23/2020   Procedure: ABDOMINAL AORTOGRAM W/LOWER EXTREMITY;  Surgeon: Iran Ouch, MD;  Location: [facility] INVASIVE CV LAB;  Service: Cardiovascular;  Laterality: Bilateral;   ABDOMINAL AORTOGRAM W/LOWER EXTREMITY N/A 11/22/2021   Procedure: ABDOMINAL AORTOGRAM W/LOWER EXTREMITY;  Surgeon: Iran Ouch, MD;  Location: [facility] INVAS[facility] CV LAB;  Service: Cardiovascular;  Laterality: N/A;   APPENDECTOMY     CARDIAC CATHETERIZATION  CARDIAC DEFIBRILLATOR REMOVAL  2005   HERNIA REPAIR  09/11/11   repair of [facility]    INGUINAL HERNIA REPAIR  09/11/2011   Procedure: HERNIA REPAIR INGUINAL ADULT;  Surgeon: Rulon Abide, DO;  Location: [facility] OR;  Service: General;  Laterality: Left;  open left inguinal hernia with mesh   LOWER EXTREMITY ANGIOGRAM N/A 02/05/2013   Procedure: LOWER EXTREMITY ANGIOGRAM;  Surgeon: Corky Crafts, MD;  Location: [facility] CATH LAB;  Service: Cardiovascular;  Laterality: N/A;   PERIPHERAL VASCULAR BALLOON ANGIOPLASTY  07/29/2019    Procedure: PERIPHERAL VASCULAR BALLOON ANGIOPLASTY;  Surgeon: Iran Ouch, MD;  Location: [facility];  Service: Cardiovascular;;   PERIPHERAL VASCULAR BALLOON ANGIOPLASTY  11/22/2021   Procedure: PERIPHERAL VASCULAR BALLOON ANGIOPLASTY;  Surgeon: Iran Ouch, MD;  Location: [facility];  Service: Cardiovascular;;   PERIPHERAL VASCULAR INTERVENTION Left 09/03/2018   Procedure: PERIPHERAL VASCULAR INTERVENTION;  Surgeon: Iran Ouch, MD;  Location: [facility];  Service: Cardiovascular;  Laterality: Left;   PERIPHERAL VASCULAR INTERVENTION Right 03/23/2020   Procedure: PERIPHERAL VASCULAR INTERVENTION;  Surgeon: Iran Ouch, MD;  Location: [facility];  Service: Cardiovascular;  Laterality: Right;   RIGHT/LEFT HEART CATH AND CORONARY ANGIOGRAPHY N/A 12/15/2019   Procedure: RIGHT/LEFT HEART CATH AND CORONARY ANGIOGRAPHY;  Surgeon: Lyn Records, MD;  Location: [facility];  Service: Cardiovascular;  Laterality: N/A;     Current Outpatient Medications  Medication Sig Dispense Refill   Calcium 200 MG TABS Take 200 mg by mouth daily. 1000 mg IV-VO     carvedilol (COREG) 12.5 MG tablet TAKE 1 TABLET BY MOUTH 2 TIMES DAILY. 180 tablet 3   celecoxib (CELEBREX) 50 MG capsule Take 200 mg by mouth daily.     clopidogrel (PLAVIX) 75 MG tablet Take 1 tablet (75 mg total) by mouth daily with breakfast. 30 tablet 11   diphenhydramine-acetaminophen (TYLENOL PM) 25-500 MG TABS tablet Take 2 tablets by mouth at bedtime.     empagliflozin (JARDIANCE) 10 MG TABS tablet Take 1 tablet (10 mg total) by mouth daily. 30 tablet 6   ENTRESTO 97-103 MG TAKE 1 TABLET BY MOUTH TWICE A DAY 60 tablet 11   eplerenone (INSPRA) 50 MG tablet TAKE 1 TABLET BY MOUTH EVERY DAY 90 tablet 3   ezetimibe (ZETIA) 10 MG tablet TAKE 1 TABLET BY MOUTH EVERY DAY 90 tablet 3   FOSAMAX 70 MG tablet Take 70 mg by mouth once a week.     methimazole (TAPAZOLE) 5 MG tablet Take 5 mg by  mouth daily.     mexiletine (MEXITIL) 150 MG capsule TAKE 1 CAPSULE BY MOUTH TWICE A DAY 180 capsule 3   mirabegron ER (MYRBETRIQ) 50 MG TB24 tablet Take 50 mg by mouth daily.     Multiple Vitamins-Minerals (ONE-A-DAY MENS 50+ ADVANTAGE PO) Take 1 tablet by mouth daily.     PARoxetine (PAXIL) 40 MG tablet Take 40 mg by mouth daily.     rosuvastatin (CRESTOR) 40 MG tablet Take 1 tablet (40 mg total) by mouth daily. 90 tablet 3   sodium chloride (OCEAN) 0.65 % SOLN nasal spray Place 1 spray into both nostrils daily as needed for congestion.     venlafaxine XR (EFFEXOR-XR) 75 MG 24 hr capsule Take 75 mg by mouth daily.     No current facility-administered medications for this visit.    Allergies:   Spironolactone and Sulfa antibiotics    Social History:  The  patient  reports that he has been smoking cigars and cigarettes. He has a 48 pack-year smoking history. He has never used smokeless tobacco. He reports current alcohol use of about 7.0 standard drinks of alcohol per week. He reports that he does not use drugs.   Family History:  The patient's family history includes Cancer in his mother.    ROS:  Please see the history of present illness.   Otherwise, review of systems are positive for none.   All other systems are reviewed and negative.    PHYSICAL EXAM: VS:  BP 108/60 (BP Location: Left Arm, Patient Position: Sitting)   Pulse 66   Ht 5\' 9"  (1.753 m)   Wt 156 lb 12.8 oz (71.1 kg)   SpO2 93%   BMI 23.16 kg/m  , BMI Body mass index is 23.16 kg/m. GEN: Well nourished, well developed, in no acute distress  HEENT: normal  Neck: no JVD, carotid bruits, or masses Cardiac: RRR; no murmurs, rubs, or gallops,no edema  Respiratory:  clear to auscultation bilaterally, normal work of breathing GI: soft, nontender, nondistended, + BS MS: no deformity or atrophy  Skin: warm and dry, no rash Neuro:  Strength and sensation are intact Psych: euthymic mood, full affect   EKG:  EKG is not  ordered today.   Recent Labs: 01/10/2024: B Natriuretic Peptide 75.0; BUN 16; Creatinine, Ser 1.10; Hemoglobin 14.0; Platelets 219; Potassium 4.2; Sodium 139    Lipid Panel    Component Value Date/Time   CHOL 132 09/12/2023 0946   CHOL 115 02/19/2020 0914   TRIG 57 09/12/2023 0946   HDL 57 09/12/2023 0946   HDL 50 02/19/2020 0914   CHOLHDL 2.3 09/12/2023 0946   VLDL 11 09/12/2023 0946   LDLCALC 64 09/12/2023 0946   LDLCALC 51 02/19/2020 0914      Wt Readings from Last 3 Encounters:  02/04/24 156 lb 12.8 oz (71.1 kg)  01/10/24 157 lb (71.2 kg)  09/12/23 157 lb 9.6 oz (71.5 kg)           No data to display            ASSESSMENT AND PLAN:   1.  Peripheral arterial disease: Status post bilateral endovascular intervention on iliac arteries with known chronically occluded right SFA/popliteal artery stent.   He has moderately reduced ABI with bilateral calf claudication that is worse on the left side.  He is going to resume his activities after his left hip pain improved with steroid injection.  Will monitor his symptoms and consider angiography if he has significant limitations.  Will plan on repeat ABI and aortoiliac duplex in September of this year.    2. Tobacco use: I again discussed the importance of smoking cessation.  He currently smokes up to a half a pack per day.  3. Hyperlipidemia: I reviewed most recent lipid profile which showed an LDL of 64. Continue treatment with rosuvastatin and Zetia.   4. Coronary artery disease: No anginal symptoms.    5. Chronic systolic heart failure: He is followed by the heart failure clinic .  He is currently on optimal medical therapy.    6.  Essential hypertension: Blood pressure is reasonably controlled on current medications.     Disposition:   Follow-up in 6 months.   Signed,  Lorine Bears, MD  02/04/2024 10:42 AM    [facility]

## 2024-02-12 DIAGNOSIS — L821 Other seborrheic keratosis: Secondary | ICD-10-CM | POA: Diagnosis not present

## 2024-02-12 DIAGNOSIS — D0439 Carcinoma in situ of skin of other parts of face: Secondary | ICD-10-CM | POA: Diagnosis not present

## 2024-02-12 DIAGNOSIS — L905 Scar conditions and fibrosis of skin: Secondary | ICD-10-CM | POA: Diagnosis not present

## 2024-02-12 DIAGNOSIS — D692 Other nonthrombocytopenic purpura: Secondary | ICD-10-CM | POA: Diagnosis not present

## 2024-02-12 DIAGNOSIS — D225 Melanocytic nevi of trunk: Secondary | ICD-10-CM | POA: Diagnosis not present

## 2024-02-12 DIAGNOSIS — L570 Actinic keratosis: Secondary | ICD-10-CM | POA: Diagnosis not present

## 2024-02-12 DIAGNOSIS — Z85828 Personal history of other malignant neoplasm of skin: Secondary | ICD-10-CM | POA: Diagnosis not present

## 2024-03-11 ENCOUNTER — Encounter: Payer: Self-pay | Admitting: Family Medicine

## 2024-03-11 DIAGNOSIS — M810 Age-related osteoporosis without current pathological fracture: Secondary | ICD-10-CM | POA: Diagnosis not present

## 2024-03-11 DIAGNOSIS — Z23 Encounter for immunization: Secondary | ICD-10-CM | POA: Diagnosis not present

## 2024-03-11 DIAGNOSIS — Z0000 Encounter for general adult medical examination without abnormal findings: Secondary | ICD-10-CM | POA: Diagnosis not present

## 2024-03-11 DIAGNOSIS — E1169 Type 2 diabetes mellitus with other specified complication: Secondary | ICD-10-CM | POA: Diagnosis not present

## 2024-03-11 DIAGNOSIS — E0590 Thyrotoxicosis, unspecified without thyrotoxic crisis or storm: Secondary | ICD-10-CM | POA: Diagnosis not present

## 2024-03-11 DIAGNOSIS — Z79899 Other long term (current) drug therapy: Secondary | ICD-10-CM | POA: Diagnosis not present

## 2024-03-11 DIAGNOSIS — I739 Peripheral vascular disease, unspecified: Secondary | ICD-10-CM | POA: Diagnosis not present

## 2024-03-11 LAB — HM AWV

## 2024-03-16 ENCOUNTER — Encounter: Payer: Self-pay | Admitting: Cardiovascular Disease

## 2024-03-22 ENCOUNTER — Other Ambulatory Visit (HOSPITAL_COMMUNITY): Payer: Self-pay | Admitting: Cardiology

## 2024-04-03 ENCOUNTER — Other Ambulatory Visit (HOSPITAL_COMMUNITY): Payer: Self-pay | Admitting: Cardiology

## 2024-04-23 ENCOUNTER — Other Ambulatory Visit (HOSPITAL_COMMUNITY): Payer: Self-pay | Admitting: Cardiology

## 2024-04-26 ENCOUNTER — Other Ambulatory Visit (HOSPITAL_COMMUNITY): Payer: Self-pay | Admitting: Cardiology

## 2024-04-29 ENCOUNTER — Other Ambulatory Visit (HOSPITAL_COMMUNITY): Payer: Self-pay | Admitting: Cardiology

## 2024-06-11 ENCOUNTER — Encounter (HOSPITAL_COMMUNITY): Payer: Self-pay | Admitting: Cardiology

## 2024-06-11 ENCOUNTER — Ambulatory Visit (HOSPITAL_COMMUNITY): Payer: Self-pay | Admitting: Cardiology

## 2024-06-11 ENCOUNTER — Ambulatory Visit (HOSPITAL_COMMUNITY)
Admission: RE | Admit: 2024-06-11 | Discharge: 2024-06-11 | Disposition: A | Discharge status: Home / Self Care | Source: Ambulatory Visit | Attending: Cardiology | Admitting: Cardiology

## 2024-06-11 ENCOUNTER — Ambulatory Visit (HOSPITAL_BASED_OUTPATIENT_CLINIC_OR_DEPARTMENT_OTHER)
Admission: RE | Admit: 2024-06-11 | Discharge: 2024-06-11 | Disposition: A | Discharge status: Home / Self Care | Source: Ambulatory Visit | Attending: Cardiology | Admitting: Cardiology

## 2024-06-11 VITALS — BP 120/82 | HR 64 | Ht 69.0 in | Wt 154.0 lb

## 2024-06-11 DIAGNOSIS — E0590 Thyrotoxicosis, unspecified without thyrotoxic crisis or storm: Secondary | ICD-10-CM | POA: Insufficient documentation

## 2024-06-11 DIAGNOSIS — I130 Hypertensive heart and chronic kidney disease with heart failure and stage 1 through stage 4 chronic kidney disease, or unspecified chronic kidney disease: Secondary | ICD-10-CM | POA: Diagnosis not present

## 2024-06-11 DIAGNOSIS — I2510 Atherosclerotic heart disease of native coronary artery without angina pectoris: Secondary | ICD-10-CM | POA: Insufficient documentation

## 2024-06-11 DIAGNOSIS — I5042 Chronic combined systolic (congestive) and diastolic (congestive) heart failure: Secondary | ICD-10-CM | POA: Diagnosis not present

## 2024-06-11 DIAGNOSIS — E785 Hyperlipidemia, unspecified: Secondary | ICD-10-CM | POA: Insufficient documentation

## 2024-06-11 DIAGNOSIS — N183 Chronic kidney disease, stage 3 unspecified: Secondary | ICD-10-CM | POA: Diagnosis not present

## 2024-06-11 DIAGNOSIS — I5022 Chronic systolic (congestive) heart failure: Secondary | ICD-10-CM | POA: Insufficient documentation

## 2024-06-11 DIAGNOSIS — E1122 Type 2 diabetes mellitus with diabetic chronic kidney disease: Secondary | ICD-10-CM | POA: Insufficient documentation

## 2024-06-11 DIAGNOSIS — I371 Nonrheumatic pulmonary valve insufficiency: Secondary | ICD-10-CM | POA: Insufficient documentation

## 2024-06-11 DIAGNOSIS — F17210 Nicotine dependence, cigarettes, uncomplicated: Secondary | ICD-10-CM | POA: Diagnosis not present

## 2024-06-11 DIAGNOSIS — Z79899 Other long term (current) drug therapy: Secondary | ICD-10-CM | POA: Insufficient documentation

## 2024-06-11 DIAGNOSIS — Z7984 Long term (current) use of oral hypoglycemic drugs: Secondary | ICD-10-CM | POA: Insufficient documentation

## 2024-06-11 DIAGNOSIS — M160 Bilateral primary osteoarthritis of hip: Secondary | ICD-10-CM | POA: Diagnosis not present

## 2024-06-11 DIAGNOSIS — I428 Other cardiomyopathies: Secondary | ICD-10-CM | POA: Insufficient documentation

## 2024-06-11 DIAGNOSIS — Z7902 Long term (current) use of antithrombotics/antiplatelets: Secondary | ICD-10-CM | POA: Diagnosis not present

## 2024-06-11 DIAGNOSIS — I739 Peripheral vascular disease, unspecified: Secondary | ICD-10-CM

## 2024-06-11 LAB — BASIC METABOLIC PANEL WITH GFR
Anion gap: 11 (ref 5–15)
BUN: 12 mg/dL (ref 8–23)
CO2: 28 mmol/L (ref 22–32)
Calcium: 9.7 mg/dL (ref 8.9–10.3)
Chloride: 101 mmol/L (ref 98–111)
Creatinine, Ser: 1.06 mg/dL (ref 0.61–1.24)
GFR, Estimated: >60 mL/min (ref >60)
Glucose, Bld: 106 mg/dL — ABNORMAL HIGH (ref 70–99)
Potassium: 4.1 mmol/L (ref 3.5–5.1)
Sodium: 140 mmol/L (ref 135–145)

## 2024-06-11 LAB — ECHOCARDIOGRAM COMPLETE
Area-P 1/2: 2.69 cm2
S' Lateral: 3.84 cm

## 2024-06-11 LAB — BRAIN NATRIURETIC PEPTIDE: B Natriuretic Peptide: 191.5 pg/mL — ABNORMAL HIGH (ref 0.0–100.0)

## 2024-06-11 LAB — TSH: TSH: 2.457 u[IU]/mL (ref 0.350–4.500)

## 2024-06-11 NOTE — Progress Notes (Signed)
  Echocardiogram 2D Echocardiogram has been performed.  Nicholas Herrera, RDCS 06/11/2024, 11:29 AM

## 2024-06-11 NOTE — Patient Instructions (Signed)
 Medication Changes:  None, continue current medications  Lab Work:  Labs done today, your results will be available in MyChart, we will contact you for abnormal readings.  Referrals:  You have been referred to [facility], they will call you to schedule  Special Instructions // Education:  Do the following things EVERYDAY: Weigh yourself in the morning before breakfast. Write it down and keep it in a log. Take your medicines as prescribed Eat low salt foods--Limit salt (sodium) to 2000 mg per day.  Stay as active as you can everyday Limit all fluids for the day to less than 2 liters   Follow-Up in: 4 months   At the [facility], you and your health needs are our priority. We have a designated team specialized in the treatment of Heart Failure. This Care Team includes your primary Heart Failure Specialized Cardiologist (physician), Advanced Practice Providers (APPs- Physician Assistants and Nurse Practitioners), and Pharmacist who all work together to provide you with the care you need, when you need it.   You may see any of the following providers on your designated Care Team at your next follow up:  Dr. Toribio Fuel Dr. Ezra Shuck Dr. Ria Commander Dr. Odis Brownie Greig Mosses, NP Caffie Shed, [place] [facility] [place], [place] Beckey Coe, NP [place] Lee, NP Tinnie Redman, PharmD   Please be sure to bring in all your medications bottles to every appointment.   Need to Contact Us :  If you have any questions or concerns before your next appointment please send us  a message through [place] or call our office at [phone].    TO LEAVE A MESSAGE FOR THE NURSE SELECT OPTION 2, PLEASE LEAVE A MESSAGE INCLUDING: YOUR NAME DATE OF BIRTH CALL BACK NUMBER REASON FOR CALL**this is important as we prioritize the call backs  YOU WILL RECEIVE A CALL BACK THE SAME DAY AS LONG AS YOU CALL BEFORE 4:00 PM

## 2024-06-12 NOTE — Progress Notes (Addendum)
 PCP: Chet Mad, DO Cardiology: Dr. Claudene HF Cardiology: Dr. Rolan  Chief complaint: CHF  83 y.o. with PAD, smoking, and a long history of cardiomyopathy. Patient was diagnosed with a cardiomyopathy prior to 2004 while he was living in [place] .  He had an ICD placed in 2004.  Per notes, EF was < 30% during this time.  He thinks he may have had a heart catheterization but does not remember the details.  The ICD subsequently malfunctioned and was turned off in 2009.  It is still implanted and has 2 nonfunctioning leads.  Echo in 2011 after moving the [facility] showed improved EF at 40-45%.  He did not have an echo over the next 9 years.  However, Echo done in 7/20 showed EF down to 20-25% with a moderately dilated LV.  He has been subsequently started on HF meds.  He saw Dr. Waddell to decide about explantation of old ICD/implantation of new ICD.  He was thought to be not a candidate for a new intravascular ICD.  Subcutaneous ICD would be a potential option but utility would like be limited with nonischemic cardiomyopathy at age 56.   Patient also has an extensive PAD history.  In 9/20 he had PTCA to left external and common iliac arteries.    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  He was well-compensated.   Peripheral angiography in 5/21 with stent to the right external iliac artery.   He had presumed diverticular bleeding in 10/21.   Holter in 3/22 with very frequent PVCs, 21% total beats.  Amiodarone  was started.  Repeat Zio monitor in 7/22 showed PVCs down to 1.8%.   Echo in 4/22 showed EF 25-30%, mild LV enlargement, normal RV.    In 1/23, he had balloon angioplasty of right CIA.   Echo in 9/23 showed EF 30-35%, mildly decreased RV systolic function, normal IVC.   Echo 12/24 showed EF 20-25%, low/normal RV, moderate pulmonic valve regurgitation.  Echo was done today and reviewed, EF 30-35%, RV normal size and systolic function.   Today  he returns for HF follow up. No dyspnea with usual activities.  No chest pain.  He reports poor balance but not lightheadedness.  He has fallen due to balance trouble.  He continues to smoke about 1/2 ppd.  He continues to have left calf claudication if he pushes himself.  He has right and left hip pain from arthritis, has not been as active.  Steroid injections have helped.  Also gets low back pain. Seems more limited by pain and balance than anything else. Weight down 3 lbs.   ECG (personally reviewed): NSR with septal Qs  Labs (9/23): K 4, creatinine 1.04 Labs (5/24): K 4.3, creatinine 1.09, LFTs normal, hgb 14.2 Labs (11/24): K 4.1, creatinine 1.46, LDL 64 Labs (5/25): K 4, creatinine 0.98, hgb 14.1, LFTs normal, LDL 71  PMH:  1. HTN 2. Hyperlipidemia 3. Active smoker 4. PAD: Right popliteal stent in 2014 with subsequent occlusion.   - Orbital atherectomy + drug coated balloon angioplasty of left SFA in 2019.   - In 9/20, had PTCA left external and common iliac arteries.  - Peripheral angiogram in 5/21 with stent to right external iliac artery.  - 1/22 peripheral arterial dopplers with significant bilateral iliac disease.  - 7/22 peripheral arterial dopplers with moderate stenosis right EIA stent.  - 1/23 PTCA to right CIA.  - ABIs (9/23): right ABI 0.58, left ABI 0.84; patent right iliac stent.  -  ABIs (9/24): right 0.66, left 0.6 5. CKD: Stage 3.  6. Cardiomyopathy: Patient had an ICD placed in [place]  in 2004, so presumably had a pre-dating cardiomyopathy.  He thinks that he had a heart cath in [place]  but is not sure.  EF <30% when he moved to [place].  - Echo (2011) with EF improved to 40-45%.  - Echo (7/20): EF 20-25%, moderate LV dilation, normal RV size and systolic function.  - Patient has 2 nonfunctioning ICD leads (ICD is not functional, turned off in 2009).  - LHC/RHC (2/21): Nonobstructive CAD.  Mean RA 3, PA 25/5, mean PCWP 7, CI 2.4.  - Echo (4/22): EF 25-30%, mild  LV enlargement, normal RV. - Echo (9/23): EF 30-35%, mildly decreased RV systolic function, normal IVC. - Echo (12/24): EF 20-25%, low/normal RV, moderate pulmonic valve regurgitation - Echo (8/25): EF 30-35%, normal RV size/systolic function 7. Type 2 diabetes 8. Diverticular bleeding in 10/21.  9. PVCs: Zio patch 3/22 showed 21% PVCs with 15 short NSVT runs.  - Zio monitor (7/22): 1.8% PVCs on amiodarone .  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   SH: Retired 15 years ago from [employer]  Married with 3 children, smokes about 1/2-1 ppd still, has had about 3 drinks/day for 2-3 years.   FH: Mother with cancer, father with COPD.  No heart disease that he knows of.   ROS: All systems reviewed and negative except as per HPI.   Current Outpatient Medications  Medication Sig Dispense Refill   acetaminophen  (TYLENOL ) 650 MG CR tablet Take 1,300 mg by mouth in the morning.     Calcium  200 MG TABS Take 200 mg by mouth daily. 1000 mg IV-VO     carvedilol  (COREG ) 12.5 MG tablet TAKE 1 TABLET BY MOUTH 2 TIMES DAILY. 180 tablet 3   celecoxib (CELEBREX) 50 MG capsule Take 200 mg by mouth daily.     clopidogrel  (PLAVIX ) 75 MG tablet Take 1 tablet (75 mg total) by mouth daily with breakfast. 30 tablet 11   diphenhydramine -acetaminophen  (TYLENOL  PM) 25-500 MG TABS tablet Take 2 tablets by mouth at bedtime.     eplerenone  (INSPRA ) 50 MG tablet TAKE 1 TABLET BY MOUTH EVERY DAY 90 tablet 3   ezetimibe  (ZETIA ) 10 MG tablet TAKE 1 TABLET BY MOUTH EVERY DAY 90 tablet 3   FOSAMAX 70 MG tablet Take 70 mg by mouth once a week.     JARDIANCE  10 MG TABS tablet TAKE 1 TABLET BY MOUTH EVERY DAY 30 tablet 6   methimazole  (TAPAZOLE ) 5 MG tablet TAKE 1 TABLET BY MOUTH THREE TIMES A DAY 270 tablet 3   mexiletine (MEXITIL) 150 MG capsule TAKE 1 CAPSULE BY MOUTH TWICE A DAY 180 capsule 1   mirabegron  ER (MYRBETRIQ ) 50 MG TB24 tablet Take 50 mg by mouth daily.     Multiple Vitamins-Minerals  (ONE-A-DAY MENS 50+ ADVANTAGE PO) Take 1 tablet by mouth daily.     PARoxetine  (PAXIL ) 40 MG tablet Take 40 mg by mouth daily.     rosuvastatin  (CRESTOR ) 40 MG tablet Take 1 tablet (40 mg total) by mouth daily. 90 tablet 3   sacubitril -valsartan  (ENTRESTO ) 97-103 MG TAKE 1 TABLET BY MOUTH TWICE A DAY 60 tablet 5   sodium chloride  (OCEAN) 0.65 % SOLN nasal spray Place 1 spray into both nostrils daily as needed for congestion.     venlafaxine  XR (EFFEXOR -XR) 75 MG 24 hr capsule Take 75 mg by mouth daily.     No current  facility-administered medications for this encounter.   Wt Readings from Last 3 Encounters:  06/11/24 69.9 kg (154 lb)  02/04/24 71.1 kg (156 lb 12.8 oz)  01/10/24 71.2 kg (157 lb)   BP 120/82   Pulse 64   Ht 5' 9 (1.753 m)   Wt 69.9 kg (154 lb)   SpO2 97%   BMI 22.74 kg/m  General: NAD Neck: No JVD, no thyromegaly or thyroid  nodule.  Lungs: Distant breath sounds.  CV: Nondisplaced PMI.  Heart regular S1/S2, no S3/S4, no murmur.  No peripheral edema.  No carotid bruit.  Unable to palpate pedal pulses, no pedal ulcerations.  Abdomen: Soft, nontender, no hepatosplenomegaly, no distention.  Skin: Intact without lesions or rashes.  Neurologic: Alert and oriented x 3.  Psych: Normal affect. Extremities: No clubbing or cyanosis.  HEENT: Normal.   Assessment/Plan: 1. Chronic systolic CHF:  Patient has had a known nonischemic cardiomyopathy since prior to 2004.  EF had improved to 40-45% in 2011 but had dropped significantly on echo in 7/20 with EF 20-25%. He has a nonfunctioning ICD.  Cause of the cardiomyopathy is uncertain.  Coronary angiography in 2/21 showed no significant coronary disease. He has no family history of cardiomyopathy.  RHC in 2/21 showed normal filling pressures and normal cardiac output.  Echo in 4/22 with EF still low at 25-30%. PVCs likely contributed to his cardiomyopathy, Zio patch in 3/22 with 21% PVCs.  Echo in 9/23 showed EF 30-35%, mildly decreased  RV systolic function, normal IVC. Echo 12/24 showed EF 20-25%, low/normal RV, moderate pulmonic valve regurgitation. Echo reviewed today showed EF 30-35%, normal RV.  NYHA class II, most limited by pain and balance issues.  Not volume overloaded by exam.  - Continue Coreg  12.5 mg bid, orthostatic symptoms at higher doses  - Continue eplerenone  50 mg daily. BMET/BNP today.   - Continue Entresto  97/103 bid.   - Continue Jardiance  10 mg daily.  - He does not appear to need Lasix.  - Patient is not a candidate for another intravascular ICD.  Limited utility to implanting subcutaneous ICD in the setting of a nonischemic cardiomyopathy with advanced age.   2. PAD:  R CIA PTCA in 1/23.  Unfortunately, still smoking.  ABIs in 9/24 were mildly worse.  No rest pain or pedal ulcerations.  Symptoms are stable. He has follow up with Dr. Darron for PV.  - Stay active, walk through pain at least a short distance.  - Continue Plavix  75  - Crestor  40 mg daily + Zetia  10 mg daily.  - Needs to quit smoking => he has been unable to do this. Has cut back to 1/2 ppd. 3. HTN: BP controlled.  - Continue current medications. 4. Hyperlipidemia: Continue Crestor  and Zetia . LDL was 71 in 5/25, would like to see LDL < 55 with vascular disease.  - Refer to lipid clinic to see if we can get Zetia  replaced with Repatha.  5. PVCs: Frequent in past, may contribute to cardiomyopathy.  7/22 Zio monitor on amiodarone  showed PVC percentage down to 1.8%.  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  He is now on mexiletine with rare palpitations.  - Continue mexiletine.   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  He is now off amiodarone  and taking methimazole .  - Continue followup with endocrinology.  - TSH today.  7. Osteoarthritis: Hip pain, try to limit Celebrex as much as possible.   Follow up in 4 months with APP  I spent 32 minutes reviewing records, interviewing/examining patient,  and managing orders.    Ezra Shuck  06/12/2024

## 2024-06-15 DIAGNOSIS — D0439 Carcinoma in situ of skin of other parts of face: Secondary | ICD-10-CM | POA: Diagnosis not present

## 2024-06-15 DIAGNOSIS — Z85828 Personal history of other malignant neoplasm of skin: Secondary | ICD-10-CM | POA: Diagnosis not present

## 2024-06-15 DIAGNOSIS — L820 Inflamed seborrheic keratosis: Secondary | ICD-10-CM | POA: Diagnosis not present

## 2024-06-15 DIAGNOSIS — D692 Other nonthrombocytopenic purpura: Secondary | ICD-10-CM | POA: Diagnosis not present

## 2024-06-15 DIAGNOSIS — L570 Actinic keratosis: Secondary | ICD-10-CM | POA: Diagnosis not present

## 2024-06-15 DIAGNOSIS — L821 Other seborrheic keratosis: Secondary | ICD-10-CM | POA: Diagnosis not present

## 2024-06-17 ENCOUNTER — Ambulatory Visit (HOSPITAL_BASED_OUTPATIENT_CLINIC_OR_DEPARTMENT_OTHER)
Admission: RE | Admit: 2024-06-17 | Discharge: 2024-06-17 | Disposition: A | Discharge status: Home / Self Care | Source: Ambulatory Visit | Attending: Cardiovascular Disease | Admitting: Cardiovascular Disease

## 2024-06-17 ENCOUNTER — Ambulatory Visit (HOSPITAL_COMMUNITY)
Admission: RE | Admit: 2024-06-17 | Discharge: 2024-06-17 | Disposition: A | Discharge status: Home / Self Care | Source: Ambulatory Visit | Attending: Cardiovascular Disease | Admitting: Cardiovascular Disease

## 2024-06-17 DIAGNOSIS — Z95820 Peripheral vascular angioplasty status with implants and grafts: Secondary | ICD-10-CM | POA: Diagnosis not present

## 2024-06-17 DIAGNOSIS — I739 Peripheral vascular disease, unspecified: Secondary | ICD-10-CM | POA: Insufficient documentation

## 2024-06-17 LAB — VAS US ABI WITH/WO TBI
Left ABI: 0.57
Right ABI: 0.65

## 2024-06-19 ENCOUNTER — Ambulatory Visit: Payer: Self-pay | Admitting: Cardiovascular Disease

## 2024-06-19 DIAGNOSIS — I739 Peripheral vascular disease, unspecified: Secondary | ICD-10-CM

## 2024-06-24 ENCOUNTER — Encounter: Payer: Self-pay | Admitting: Family Medicine

## 2024-06-24 ENCOUNTER — Ambulatory Visit (INDEPENDENT_AMBULATORY_CARE_PROVIDER_SITE_OTHER): Admitting: Family Medicine

## 2024-06-24 VITALS — BP 102/68 | HR 60 | Temp 98.0°F | Resp 18 | Ht 69.0 in | Wt 153.4 lb

## 2024-06-24 DIAGNOSIS — G250 Essential tremor: Secondary | ICD-10-CM | POA: Diagnosis not present

## 2024-06-24 DIAGNOSIS — F418 Other specified anxiety disorders: Secondary | ICD-10-CM | POA: Diagnosis not present

## 2024-06-24 DIAGNOSIS — Z7689 Persons encountering health services in other specified circumstances: Secondary | ICD-10-CM

## 2024-06-24 DIAGNOSIS — I2510 Atherosclerotic heart disease of native coronary artery without angina pectoris: Secondary | ICD-10-CM

## 2024-06-24 DIAGNOSIS — E0590 Thyrotoxicosis, unspecified without thyrotoxic crisis or storm: Secondary | ICD-10-CM | POA: Diagnosis not present

## 2024-06-24 MED ORDER — METHIMAZOLE 5 MG PO TABS: 5.0000 mg | ORAL_TABLET | Freq: Every day | ORAL | Status: AC

## 2024-06-24 NOTE — Progress Notes (Signed)
 New Patient Office Visit  Subjective    Patient ID: Nicholas Herrera, male    DOB: 12-11-1940  Age: 83 y.o. MRN: [id number]  CC:  Chief Complaint  Patient presents with   Establish Care    Patient is here to establish care with a new PCP    HPI Nicholas Herrera presents to establish care. Pt is new to me. Previous PCP was at [facility].   He has cardiology for CAD and PAD.  He is on Plavix  75mg  daily, Inspra  50mg  daily, Jardiance  10mg , Entresto , Crestor  74m. He is seeing lipid clinic with appt Sept 23.  He has been diagnosed with benign tremors worse with writing or using his hand. When he gets up in the morning, hard to hold his cup of coffee. He is right handed. He is using tylenol  650 mg 2 tabs po in the morning for arthritis for hip and back pain.  He was diagnosed with osteoporosis and is on Fosamax and calcium . He is taking Paxil  40mg  daily and Effexor  75 mg for anxiety.  He is taking Methimazole  5mg  daily. Last TSH was 2.4 on 8/14.   Outpatient Encounter Medications as of 06/24/2024  Medication Sig   acetaminophen  (TYLENOL ) 650 MG CR tablet Take 1,300 mg by mouth in the morning.   Calcium  200 MG TABS Take 200 mg by mouth daily. 1000 mg IV-VO   carvedilol  (COREG ) 12.5 MG tablet TAKE 1 TABLET BY MOUTH 2 TIMES DAILY.   celecoxib (CELEBREX) 50 MG capsule Take 200 mg by mouth daily.   clopidogrel  (PLAVIX ) 75 MG tablet Take 1 tablet (75 mg total) by mouth daily with breakfast.   diphenhydramine -acetaminophen  (TYLENOL  PM) 25-500 MG TABS tablet Take 2 tablets by mouth at bedtime.   eplerenone  (INSPRA ) 50 MG tablet TAKE 1 TABLET BY MOUTH EVERY DAY   ezetimibe  (ZETIA ) 10 MG tablet TAKE 1 TABLET BY MOUTH EVERY DAY   FOSAMAX 70 MG tablet Take 70 mg by mouth once a week.   JARDIANCE  10 MG TABS tablet TAKE 1 TABLET BY MOUTH EVERY DAY   methimazole  (TAPAZOLE ) 5 MG tablet Take 1 tablet (5 mg total) by mouth daily.   mexiletine (MEXITIL) 150 MG capsule TAKE 1 CAPSULE BY MOUTH TWICE A  DAY   mirabegron  ER (MYRBETRIQ ) 50 MG TB24 tablet Take 50 mg by mouth daily.   Multiple Vitamins-Minerals (ONE-A-DAY MENS 50+ ADVANTAGE PO) Take 1 tablet by mouth daily.   PARoxetine  (PAXIL ) 40 MG tablet Take 40 mg by mouth daily.   rosuvastatin  (CRESTOR ) 40 MG tablet Take 1 tablet (40 mg total) by mouth daily.   sacubitril -valsartan  (ENTRESTO ) 97-103 MG TAKE 1 TABLET BY MOUTH TWICE A DAY   sodium chloride  (OCEAN) 0.65 % SOLN nasal spray Place 1 spray into both nostrils daily as needed for congestion.   venlafaxine  XR (EFFEXOR -XR) 75 MG 24 hr capsule Take 75 mg by mouth daily.   [DISCONTINUED] methimazole  (TAPAZOLE ) 5 MG tablet TAKE 1 TABLET BY MOUTH THREE TIMES A DAY   No facility-administered encounter medications on file as of 06/24/2024.    Past Medical History:  Diagnosis Date   Abdominal distention    Abdominal pain    Bronchitis    hx of   Cancer (HCC)    skin   CHF (congestive heart failure) (HCC)     (hx of EF 27%) s/p AICD 2004. Most recent LVEF > 40% , 8/11   Chronic kidney disease    has small stones, no treatment  Cough    Depression    takes paxil    Diverticulosis    hx of   Dysrhythmia    Easy bruising    Hematuria    hx of   Hernia october 2012   [facility]   HTN (hypertension)    Hyperlipidemia    takes zocor   Inguinal hernia    Left groin pain     Past Surgical History:  Procedure Laterality Date   ABDOMINAL AORTOGRAM W/LOWER EXTREMITY N/A 09/03/2018   Procedure: ABDOMINAL AORTOGRAM W/LOWER EXTREMITY;  Surgeon: Darron Deatrice LABOR, MD;  [facility]: [facility];  Service: Cardiovascular;  Laterality: N/A;   ABDOMINAL AORTOGRAM W/LOWER EXTREMITY N/A 07/29/2019   Procedure: ABDOMINAL AORTOGRAM W/LOWER EXTREMITY;  Surgeon: Darron Deatrice LABOR, MD;  Location: [facility];  Service: Cardiovascular;  Laterality: N/A;   ABDOMINAL AORTOGRAM W/LOWER EXTREMITY Bilateral 03/23/2020   Procedure: ABDOMINAL AORTOGRAM W/LOWER EXTREMITY;  Surgeon: Darron Deatrice LABOR,  MD;  Location: [facility];  Service: Cardiovascular;  Laterality: Bilateral;   ABDOMINAL AORTOGRAM W/LOWER EXTREMITY N/A 11/22/2021   Procedure: ABDOMINAL AORTOGRAM W/LOWER EXTREMITY;  Surgeon: Darron Deatrice LABOR, MD;  Location: [facility];  Service: Cardiovascular;  Laterality: N/A;   APPENDECTOMY     CARDIAC CATHETERIZATION     CARDIAC DEFIBRILLATOR REMOVAL  2005   HERNIA REPAIR  09/11/11   repair of [facility]    INGUINAL HERNIA REPAIR  09/11/2011   Procedure: HERNIA REPAIR INGUINAL ADULT;  Surgeon: Redell Alm Faith, DO;  Location: [facility] OR;  Service: General;  Laterality: Left;  open left inguinal hernia with mesh   LOWER EXTREMITY ANGIOGRAM N/A 02/05/2013   Procedure: LOWER EXTREMITY ANGIOGRAM;  Surgeon: Candyce GORMAN Reek, MD;  Location: [facility] CATH LAB;  Service: Cardiovascular;  Laterality: N/A;   PERIPHERAL VASCULAR BALLOON ANGIOPLASTY  07/29/2019   Procedure: PERIPHERAL VASCULAR BALLOON ANGIOPLASTY;  Surgeon: Darron Deatrice LABOR, MD;  Location: [facility];  Service: Cardiovascular;;   PERIPHERAL VASCULAR BALLOON ANGIOPLASTY  11/22/2021   Procedure: PERIPHERAL VASCULAR BALLOON ANGIOPLASTY;  Surgeon: Darron Deatrice LABOR, MD;  Location: [facility];  Service: Cardiovascular;;   PERIPHERAL VASCULAR INTERVENTION Left 09/03/2018   Procedure: PERIPHERAL VASCULAR INTERVENTION;  Surgeon: Darron Deatrice LABOR, MD;  Location: [facility];  Service: Cardiovascular;  Laterality: Left;   PERIPHERAL VASCULAR INTERVENTION Right 03/23/2020   Procedure: PERIPHERAL VASCULAR INTERVENTION;  Surgeon: Darron Deatrice LABOR, MD;  Location: [facility];  Service: Cardiovascular;  Laterality: Right;   RIGHT/LEFT HEART CATH AND CORONARY ANGIOGRAPHY N/A 12/15/2019   Procedure: RIGHT/LEFT HEART CATH AND CORONARY ANGIOGRAPHY;  Surgeon: Claudene Victory ORN, MD;  Location: [facility];  Service: Cardiovascular;  Laterality: N/A;    Family History  Problem Relation Age of Onset   Cancer Mother      Social History   Socioeconomic History   Marital status: Married    Spouse name: Not on file   Number of children: Not on file   Years of education: Not on file   Highest education level: Bachelor's degree (e.g., BA, AB, BS)  Occupational History   Not on file  Tobacco Use   Smoking status: Every Day    Current packs/day: 1.00    Average packs/day: 1 pack/day for 48.0 years (48.0 ttl pk-yrs)    Types: Cigars, Cigarettes    Passive exposure: Current   Smokeless tobacco: Never  Vaping Use   Vaping status: Never Used  Substance and Sexual Activity  Alcohol use: Yes    Alcohol/week: 7.0 standard drinks of alcohol    Types: 7 drink(s) per week   Drug use: No   Sexual activity: Not Currently  Other Topics Concern   Not on file  Social History Narrative   Not on file   Social Drivers of Health   Financial Resource Strain: Low Risk  (06/20/2024)   Overall Financial Resource Strain (CARDIA)    Difficulty of Paying Living Expenses: Not hard at all  Food Insecurity: No Food Insecurity (06/20/2024)   Hunger Vital Sign    Worried About Running Out of Food in the Last Year: Never true    Ran Out of Food in the Last Year: Never true  Transportation Needs: No Transportation Needs (06/20/2024)   PRAPARE - Administrator, Civil Service (Medical): No    Lack of Transportation (Non-Medical): No  Physical Activity: Inactive (06/20/2024)   Exercise Vital Sign    Days of Exercise per Week: 0 days    Minutes of Exercise per Session: Not on file  Stress: No Stress Concern Present (06/20/2024)   [employer] of Occupational Health - Occupational Stress Questionnaire    Feeling of Stress: Only a little  Social Connections: Socially Isolated (06/20/2024)   Social Connection and Isolation Panel    Frequency of Communication with Friends and Family: Once a week    Frequency of Social Gatherings with Friends and Family: Once a week    Attends Religious Services: Never     Database administrator or Organizations: No    Attends Engineer, structural: Not on file    Marital Status: Married  Intimate Partner Violence: Unknown (02/02/2022)   Received from [facility]   HITS    Physically Hurt: Not on file    Insult or Talk Down To: Not on file    Threaten Physical Harm: Not on file    Scream or Curse: Not on file    Review of Systems  Neurological:        Essential tremor  All other systems reviewed and are negative.       Objective    BP 102/68   Pulse 60   Temp 98 F (36.7 C) (Oral)   Resp 18   Ht 5' 9 (1.753 m)   Wt 153 lb 6.4 oz (69.6 kg)   SpO2 96%   BMI 22.65 kg/m   Physical Exam Vitals and nursing note reviewed.  Constitutional:      Appearance: Normal appearance. He is normal weight.  HENT:     Head: Normocephalic and atraumatic.     Right Ear: External ear normal.     Left Ear: External ear normal.     Nose: Nose normal.     Mouth/Throat:     Mouth: Mucous membranes are moist.     Pharynx: Oropharynx is clear.  Eyes:     Conjunctiva/sclera: Conjunctivae normal.     Pupils: Pupils are equal, round, and reactive to light.  Cardiovascular:     Rate and Rhythm: Regular rhythm. Bradycardia present.     Pulses: Normal pulses.     Heart sounds: Normal heart sounds.  Pulmonary:     Effort: Pulmonary effort is normal.     Breath sounds: Normal breath sounds.  Skin:    General: Skin is warm.     Capillary Refill: Capillary refill takes less than 2 seconds.  Neurological:     General: No focal deficit present.  Mental Status: He is alert and oriented to person, place, and time. Mental status is at baseline.  Psychiatric:        Mood and Affect: Mood normal.        Behavior: Behavior normal.        Thought Content: Thought content normal.        Judgment: Judgment normal.       Assessment & Plan:   Problem List Items Addressed This Visit       Cardiovascular and Mediastinum   Atherosclerotic heart  disease of native coronary artery without angina pectoris   Other Visit Diagnoses       Encounter to establish care with new doctor    -  Primary     Benign essential tremor         Depression with anxiety         Hyperthyroidism       Relevant Medications   methimazole  (TAPAZOLE ) 5 MG tablet     Encounter to establish care with new doctor  Benign essential tremor  Atherosclerosis of native coronary artery without angina pectoris, unspecified whether native or transplanted heart  Depression with anxiety  Hyperthyroidism -     methIMAzole ; Take 1 tablet (5 mg total) by mouth daily.   Pt with chronic conditions, stable.  No refills needed.  He would like to monitor tremor for now and didn't want to try any medicine at this time.    No follow-ups on file.   Torrence CINDERELLA Barrier, MD  Total time spent with patient today 36 minutes. This includes reviewing records, evaluating the patient and coordinating care. Face-to-face time >50%.

## 2024-06-25 ENCOUNTER — Telehealth: Payer: Self-pay

## 2024-06-25 ENCOUNTER — Other Ambulatory Visit: Payer: Self-pay

## 2024-06-25 NOTE — Telephone Encounter (Signed)
 Medication has been removed as requested by patient   Copied from CRM #[id number]. Topic: Clinical - Medical Advice >> Jun 25, 2024 10:28 AM Amy B wrote: Reason for CRM: Patient requests an update to his medication list.  He states he was on a temporary medication of Celebrex last year which he stopped taking in November 2024.  He requests that it be removed from the list as it was only temporary.

## 2024-06-30 ENCOUNTER — Encounter: Payer: Self-pay | Admitting: Family Medicine

## 2024-07-17 ENCOUNTER — Ambulatory Visit
Admission: EM | Admit: 2024-07-17 | Discharge: 2024-07-17 | Disposition: A | Discharge status: Home / Self Care | Attending: Family Medicine | Admitting: Family Medicine

## 2024-07-17 DIAGNOSIS — Z23 Encounter for immunization: Secondary | ICD-10-CM

## 2024-07-17 DIAGNOSIS — S51002A Unspecified open wound of left elbow, initial encounter: Secondary | ICD-10-CM

## 2024-07-17 DIAGNOSIS — S472XXA Crushing injury of left shoulder and upper arm, initial encounter: Secondary | ICD-10-CM

## 2024-07-17 DIAGNOSIS — T148XXA Other injury of unspecified body region, initial encounter: Secondary | ICD-10-CM | POA: Diagnosis not present

## 2024-07-17 MED ORDER — TETANUS-DIPHTH-ACELL PERTUSSIS 5-2.5-18.5 LF-MCG/0.5 IM SUSY
0.5000 mL | PREFILLED_SYRINGE | Freq: Once | INTRAMUSCULAR | Status: AC
  Administered 2024-07-17: 0.5 mL via INTRAMUSCULAR

## 2024-07-17 MED ORDER — DOXYCYCLINE HYCLATE 100 MG PO CAPS: 100.0000 mg | ORAL_CAPSULE | Freq: Two times a day (BID) | ORAL | 0 refills | Status: AC

## 2024-07-17 NOTE — ED Provider Notes (Signed)
 Nicholas Herrera CARE    CSN: [id number] Arrival date & time: 07/17/24  9077      History   Chief Complaint Chief Complaint  Patient presents with   Fall    HPI Nicholas Herrera is a 83 y.o. male.   HPI very pleasant 83 year old male presents with injury of the right arm.  Reports stumbled last night while bending over to remove doorstop from porch causing skin avulsion of right arm.  PMH significant for CHF, tobacco use disorder, and skin cancer.  Patient is accompanied by his wife this morning.  Past Medical History:  Diagnosis Date   Abdominal distention    Abdominal pain    Bronchitis    hx of   Cancer (HCC)    skin   CHF (congestive heart failure) (HCC)     (hx of EF 27%) s/p AICD 2004. 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     on Methimazole    Inguinal hernia    Left groin pain    Osteoporosis    on Fosamax since 2024, due for BMD in 2026    Patient Active Problem List   Diagnosis Date Noted   Atherosclerotic heart disease of native coronary artery without angina pectoris 08/23/2020   Hyperglycemia 08/23/2020   Abscess of sigmoid colon due to diverticulitis 11/12/2016   Hematuria 11/24/2014   Chronic combined systolic and diastolic heart failure (HCC) 11/24/2014   Essential hypertension 11/24/2014   Peripheral vascular disease (HCC) 09/03/2013   Tobacco use disorder 09/03/2013   Hyperlipidemia    Cancer (HCC)    Dysrhythmia    Depression    Hernia 07/30/2011    Past Surgical History:  Procedure Laterality Date   ABDOMINAL AORTOGRAM W/LOWER EXTREMITY N/A 09/03/2018   Procedure: ABDOMINAL AORTOGRAM W/LOWER EXTREMITY;  Surgeon: Darron Deatrice LABOR, MD;  Location:  [facility];  Service: Cardiovascular;  Laterality: N/A;   ABDOMINAL AORTOGRAM W/LOWER EXTREMITY N/A 07/29/2019   Procedure: ABDOMINAL AORTOGRAM W/LOWER EXTREMITY;  Surgeon: Darron Deatrice LABOR, MD;  Location: [facility];  Service: Cardiovascular;  Laterality: N/A;   ABDOMINAL AORTOGRAM W/LOWER EXTREMITY Bilateral 03/23/2020   Procedure: ABDOMINAL AORTOGRAM W/LOWER EXTREMITY;  Surgeon: Darron Deatrice LABOR, MD;  Location: [facility];  Service: Cardiovascular;  Laterality: Bilateral;   ABDOMINAL AORTOGRAM W/LOWER EXTREMITY N/A 11/22/2021   Procedure: ABDOMINAL AORTOGRAM W/LOWER EXTREMITY;  Surgeon: Darron Deatrice LABOR, MD;  Location: [facility];  Service: Cardiovascular;  Laterality: N/A;   APPENDECTOMY     CARDIAC CATHETERIZATION     CARDIAC DEFIBRILLATOR REMOVAL  2005   HERNIA REPAIR  09/11/11   repair of [facility]    INGUINAL HERNIA REPAIR  09/11/2011   Procedure: HERNIA REPAIR INGUINAL ADULT;  Surgeon: Redell Alm Faith, DO;  Location: [facility] OR;  Service: General;  Laterality: Left;  open left inguinal hernia with mesh   LOWER EXTREMITY ANGIOGRAM N/A 02/05/2013   Procedure: LOWER EXTREMITY ANGIOGRAM;  Surgeon: Candyce GORMAN Reek, MD;  Location: [facility] CATH LAB;  Service: Cardiovascular;  Laterality: N/A;   PERIPHERAL  VASCULAR BALLOON ANGIOPLASTY  07/29/2019   Procedure: PERIPHERAL VASCULAR BALLOON ANGIOPLASTY;  Surgeon: Darron Deatrice LABOR, MD;  [facility]: [facility];  Service: Cardiovascular;;   PERIPHERAL VASCULAR BALLOON ANGIOPLASTY  11/22/2021   Procedure: PERIPHERAL VASCULAR BALLOON ANGIOPLASTY;  Surgeon: Darron Deatrice LABOR, MD;  [facility];  Service: Cardiovascular;;   PERIPHERAL VASCULAR INTERVENTION Left 09/03/2018   Procedure: PERIPHERAL VASCULAR INTERVENTION;  Surgeon: Darron Deatrice LABOR, MD;  [facility]: [facility];  Service: Cardiovascular;  Laterality: Left;   PERIPHERAL VASCULAR INTERVENTION Right 03/23/2020   Procedure: PERIPHERAL VASCULAR  INTERVENTION;  Surgeon: Darron Deatrice LABOR, MD;  [facility];  Service: Cardiovascular;  Laterality: Right;   RIGHT/LEFT HEART CATH AND CORONARY ANGIOGRAPHY N/A 12/15/2019   Procedure: RIGHT/LEFT HEART CATH AND CORONARY ANGIOGRAPHY;  Surgeon: Claudene Victory ORN, MD;  [facility]: [facility];  Service: Cardiovascular;  Laterality: N/A;       Home Medications    Prior to Admission medications   Medication Sig Start Date End Date Taking? Authorizing Provider  doxycycline  (VIBRAMYCIN ) 100 MG capsule Take 1 capsule (100 mg total) by mouth 2 (two) times daily for 10 days. 07/17/24 07/27/24 Yes Teddy Sharper, FNP  acetaminophen  (TYLENOL ) 650 MG CR tablet Take 1,300 mg by mouth in the morning.    [provider]  Calcium  200 MG TABS Take 200 mg by mouth daily. 1000 mg IV-VO    [provider]  carvedilol  (COREG ) 12.5 MG tablet TAKE 1 TABLET BY MOUTH 2 TIMES DAILY. 01/28/24   Glena Harlene HERO, FNP  clopidogrel  (PLAVIX ) 75 MG tablet Take 1 tablet (75 mg total) by mouth daily with breakfast. 02/06/13   Dann Candyce RAMAN, MD  diphenhydramine -acetaminophen  (TYLENOL  PM) 25-500 MG TABS tablet Take 2 tablets by mouth at bedtime.    [provider]  eplerenone  (INSPRA ) 50 MG tablet TAKE 1 TABLET BY MOUTH EVERY DAY 04/27/24   McLean, Dalton S, MD  ezetimibe  (ZETIA ) 10 MG tablet TAKE 1 TABLET BY MOUTH EVERY DAY 08/02/23   Darron Deatrice LABOR, MD  FOSAMAX 70 MG tablet Take 70 mg by mouth once a week. 04/29/23   [provider]  JARDIANCE  10 MG TABS tablet TAKE 1 TABLET BY MOUTH EVERY DAY 03/25/24   Rolan Ezra RAMAN, MD  methimazole  (TAPAZOLE ) 5 MG tablet Take 1 tablet (5 mg total) by mouth daily. 06/24/24   Colette Torrence GRADE, MD  mexiletine (MEXITIL) 150 MG capsule TAKE 1 CAPSULE BY MOUTH TWICE A DAY 04/29/24   Rolan Ezra RAMAN, MD  mirabegron  ER (MYRBETRIQ ) 50 MG TB24 tablet Take 50 mg by mouth daily.    [provider]  Multiple Vitamins-Minerals (ONE-A-DAY  MENS 50+ ADVANTAGE PO) Take 1 tablet by mouth daily.    [provider]  PARoxetine  (PAXIL ) 40 MG tablet Take 40 mg by mouth daily. 10/18/16   [provider]  rosuvastatin  (CRESTOR ) 40 MG tablet Take 1 tablet (40 mg total) by mouth daily. 08/26/18   Darron Deatrice LABOR, MD  sacubitril -valsartan  (ENTRESTO ) 97-103 MG TAKE 1 TABLET BY MOUTH TWICE A DAY 04/29/24   Rolan Ezra RAMAN, MD  sodium chloride  (OCEAN) 0.65 % SOLN nasal spray Place 1 spray into both nostrils daily as needed for congestion.    [provider]  venlafaxine  XR (EFFEXOR -XR) 75 MG 24 hr capsule Take 75 mg by mouth daily. 07/13/18   [provider]    Family History Family History  Problem Relation Age of Onset  Cancer Mother     Social History Social History   Tobacco Use   Smoking status: Every Day    Current packs/day: 1.00    Average packs/day: 1 pack/day for 48.0 years (48.0 ttl pk-yrs)    Types: Cigars, Cigarettes    Passive exposure: Current   Smokeless tobacco: Never  Vaping Use   Vaping status: Never Used  Substance Use Topics   Alcohol use: Yes    Alcohol/week: 7.0 standard drinks of alcohol    Types: 7 drink(s) per week   Drug use: No     Allergies   Spironolactone  and Sulfa antibiotics   Review of Systems Review of Systems  Skin:  Positive for wound.     Physical Exam Triage Vital Signs ED Triage Vitals  Encounter Vitals Group     BP      Girls Systolic BP Percentile      Girls Diastolic BP Percentile      Boys Systolic BP Percentile      Boys Diastolic BP Percentile      Pulse      Resp      Temp      Temp src      SpO2      Weight      Height      Head Circumference      Peak Flow      Pain Score      Pain Loc      Pain Education      Exclude from Growth Chart    No data found.  Updated Vital Signs BP (!) 145/82   Pulse 70   Temp 97.8 F (36.6 C)   Resp 19   SpO2 98%   Visual Acuity Right Eye Distance:   Left Eye Distance:    Bilateral Distance:    Right Eye Near:   Left Eye Near:    Bilateral Near:     Physical Exam Vitals and nursing note reviewed.  Constitutional:      Appearance: Normal appearance. He is normal weight.  HENT:     Head: Normocephalic and atraumatic.     Mouth/Throat:     Mouth: Mucous membranes are moist.     Pharynx: Oropharynx is clear.  Eyes:     Extraocular Movements: Extraocular movements intact.     Conjunctiva/sclera: Conjunctivae normal.     Pupils: Pupils are equal, round, and reactive to light.  Cardiovascular:     Rate and Rhythm: Normal rate and regular rhythm.     Pulses: Normal pulses.  Pulmonary:     Effort: Pulmonary effort is normal.     Breath sounds: Normal breath sounds. No wheezing, rhonchi or rales.  Musculoskeletal:        General: Normal range of motion.     Cervical back: Normal range of motion and neck supple.  Skin:    General: Skin is warm and dry.     Comments: Left lower arm (lateral aspect from mid forearm to lateral olecranon): Significant skin avulsion with continuous bleeding noted, once cleaned and irrigated with normal saline Xeroform placed over avulsed surfaces followed by Kerlix  Neurological:     General: No focal deficit present.     Mental Status: He is alert and oriented to person, place, and time. Mental status is at baseline.  Psychiatric:        Mood and Affect: Mood normal.        Behavior: Behavior normal.  UC Treatments / Results  Labs (all labs ordered are listed, but only abnormal results are displayed) Labs Reviewed - No data to display  EKG   Radiology No results found.  Procedures Procedures (including critical care time)  Medications Ordered in UC Medications  Tdap (BOOSTRIX ) injection 0.5 mL (0.5 mLs Intramuscular Given 07/17/24 1008)    Initial Impression / Assessment and Plan / UC Course  I have reviewed the triage vital signs and the nursing notes.  Pertinent labs & imaging results that were  available during my care of the patient were reviewed by me and considered in my medical decision making (see chart for details).     MDM: 1.  Avulsion of skin-Rx'd doxycycline : Take 1 capsule twice daily x 10 days; 2.  Avulsion of skin of elbow, left, initial encounter-Rx'd doxycycline : Take 1 capsule twice daily x 10 days.  Tdap provided once in clinic prior to discharge.  Patient/wife advised to return to clinic on Sunday, 07/19/2024 for wound check. Advised patient to keep wound covered, clean, and moist for the next 2 days.  Advised patient to return to clinic on Sunday, 07/20/2023 for wound check.  Advised patient to take medication as directed with food to completion.  Encouraged to increase daily water intake to 64 ounces per day while taking this medication.  Advised if symptoms worsen and/or unresolved please follow-up with your PCP or here for further evaluation.  Patient discharged home, hemodynamically stable. Final Clinical Impressions(s) / UC Diagnoses   Final diagnoses:  Avulsion of skin  Avulsion of skin of elbow, left, initial encounter     Discharge Instructions      Advised patient to keep wound covered, clean, and moist for the next 2 days.  Advised patient to return to clinic on Sunday, 07/20/2023 for wound check.  Advised patient to take medication as directed with food to completion.  Encouraged to increase daily water intake to 64 ounces per day while taking this medication.  Advised if symptoms worsen and/or unresolved please follow-up with your PCP or here for further evaluation.     ED Prescriptions     Medication Sig Dispense Auth. Provider   doxycycline  (VIBRAMYCIN ) 100 MG capsule Take 1 capsule (100 mg total) by mouth 2 (two) times daily for 10 days. 20 capsule Armani Brar, FNP      PDMP not reviewed this encounter.   Teddy Sharper, FNP 07/17/24 1046

## 2024-07-17 NOTE — ED Triage Notes (Signed)
 Pt presents to uc with wife. Pt reports he stumbled last night when he was bending over to remove a door stop on his porch he fell over and hit his left arm on the door frame avulsing the skin. Wife cleaned with warm water and wraped with gauze. Pt took tylenol 

## 2024-07-17 NOTE — Discharge Instructions (Addendum)
 Advised patient to keep wound covered, clean, and moist for the next 2 days.  Advised patient to return to clinic on Sunday, 07/20/2023 for wound check.  Advised patient to take medication as directed with food to completion.  Encouraged to increase daily water intake to 64 ounces per day while taking this medication.  Advised if symptoms worsen and/or unresolved please follow-up with your PCP or here for further evaluation.

## 2024-07-17 NOTE — ED Notes (Signed)
 Removed dressing that was present on wound when patient arrived. With saline. Cleaned wound with Hibiclens and warm water. NP called to assess wound.

## 2024-07-19 ENCOUNTER — Ambulatory Visit: Admission: EM | Admit: 2024-07-19 | Discharge: 2024-07-19 | Disposition: A | Discharge status: Home / Self Care

## 2024-07-19 ENCOUNTER — Other Ambulatory Visit: Payer: Self-pay

## 2024-07-19 DIAGNOSIS — Z5189 Encounter for other specified aftercare: Secondary | ICD-10-CM | POA: Diagnosis not present

## 2024-07-19 NOTE — ED Triage Notes (Signed)
 Pt presents to uc for wound check was seen here on 9/19. Pt reports pain is managable and swelling has decreased has kept wound dressing in place since last visit

## 2024-07-19 NOTE — ED Provider Notes (Signed)
 Nicholas Herrera CARE    CSN: [id number] Arrival date & time: 07/19/24  9074      History   Chief Complaint Chief Complaint  Patient presents with   Wound Check    HPI Nicholas Herrera is a 83 y.o. male.   HPI Very pleasant 83 year old male presents for wound check of left arm skin avulsion sustained on 07/17/2024-please see epic for that encounter note.  The patient is accompanied by his wife this morning.  Patient denies any bleeding from wound.  PMH significant for CHF.  Past Medical History:  Diagnosis Date   Abdominal distention    Abdominal pain    Bronchitis    hx of   Cancer (HCC)    skin   CHF (congestive heart failure) (HCC)     (hx of EF 27%) s/p AICD 2004. 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     on Methimazole    Inguinal hernia    Left groin pain    Osteoporosis    on Fosamax since 2024, due for BMD in 2026    Patient Active Problem List   Diagnosis Date Noted   Atherosclerotic heart disease of native coronary artery without angina pectoris 08/23/2020   Hyperglycemia 08/23/2020   Abscess of sigmoid colon due to diverticulitis 11/12/2016   Hematuria 11/24/2014   Chronic combined systolic and diastolic heart failure (HCC) 11/24/2014   Essential hypertension 11/24/2014   Peripheral vascular disease (HCC) 09/03/2013   Tobacco use disorder 09/03/2013   Hyperlipidemia    Cancer (HCC)    Dysrhythmia    Depression    Hernia 07/30/2011    Past Surgical History:  Procedure Laterality Date   ABDOMINAL AORTOGRAM W/LOWER EXTREMITY N/A 09/03/2018   Procedure: ABDOMINAL AORTOGRAM W/LOWER EXTREMITY;  Surgeon: Darron Deatrice LABOR, MD;  [facility]: [facility];   Service: Cardiovascular;  Laterality: N/A;   ABDOMINAL AORTOGRAM W/LOWER EXTREMITY N/A 07/29/2019   Procedure: ABDOMINAL AORTOGRAM W/LOWER EXTREMITY;  Surgeon: Darron Deatrice LABOR, MD;  [facility]: [facility];  Service: Cardiovascular;  Laterality: N/A;   ABDOMINAL AORTOGRAM W/LOWER EXTREMITY Bilateral 03/23/2020   Procedure: ABDOMINAL AORTOGRAM W/LOWER EXTREMITY;  Surgeon: Darron Deatrice LABOR, MD;  [facility]: [facility];  Service: Cardiovascular;  Laterality: Bilateral;   ABDOMINAL AORTOGRAM W/LOWER EXTREMITY N/A 11/22/2021   Procedure: ABDOMINAL AORTOGRAM W/LOWER EXTREMITY;  Surgeon: Darron Deatrice LABOR, MD;  [facility]: [facility];  Service: Cardiovascular;  Laterality: N/A;   APPENDECTOMY     CARDIAC CATHETERIZATION     CARDIAC DEFIBRILLATOR REMOVAL  2005   HERNIA REPAIR  09/11/11   repair of [facility]    INGUINAL HERNIA REPAIR  09/11/2011   Procedure: HERNIA REPAIR INGUINAL ADULT;  Surgeon: Redell Alm Faith, DO;  Location: [facility] OR;  Service: General;  Laterality: Left;  open left inguinal hernia with mesh   LOWER EXTREMITY ANGIOGRAM N/A 02/05/2013   Procedure: LOWER EXTREMITY ANGIOGRAM;  Surgeon: Candyce GORMAN Reek, MD;  Location: [facility] CATH LAB;  Service: Cardiovascular;  Laterality: N/A;   PERIPHERAL VASCULAR BALLOON ANGIOPLASTY  07/29/2019  Procedure: PERIPHERAL VASCULAR BALLOON ANGIOPLASTY;  Surgeon: Darron Deatrice LABOR, MD;  [facility];  Service: Cardiovascular;;   PERIPHERAL VASCULAR BALLOON ANGIOPLASTY  11/22/2021   Procedure: PERIPHERAL VASCULAR BALLOON ANGIOPLASTY;  Surgeon: Darron Deatrice LABOR, MD;  [facility];  Service: Cardiovascular;;   PERIPHERAL VASCULAR INTERVENTION Left 09/03/2018   Procedure: PERIPHERAL VASCULAR INTERVENTION;  Surgeon: Darron Deatrice LABOR, MD;  [facility];  Service: Cardiovascular;  Laterality: Left;   PERIPHERAL VASCULAR INTERVENTION Right 03/23/2020   Procedure: PERIPHERAL VASCULAR INTERVENTION;  Surgeon:  Darron Deatrice LABOR, MD;  [facility];  Service: Cardiovascular;  Laterality: Right;   RIGHT/LEFT HEART CATH AND CORONARY ANGIOGRAPHY N/A 12/15/2019   Procedure: RIGHT/LEFT HEART CATH AND CORONARY ANGIOGRAPHY;  Surgeon: Claudene Victory ORN, MD;  [facility];  Service: Cardiovascular;  Laterality: N/A;       Home Medications    Prior to Admission medications   Medication Sig Start Date End Date Taking? Authorizing Provider  acetaminophen  (TYLENOL ) 650 MG CR tablet Take 1,300 mg by mouth in the morning.    [provider]  Calcium  200 MG TABS Take 200 mg by mouth daily. 1000 mg IV-VO    [provider]  carvedilol  (COREG ) 12.5 MG tablet TAKE 1 TABLET BY MOUTH 2 TIMES DAILY. 01/28/24   Glena Harlene HERO, FNP  clopidogrel  (PLAVIX ) 75 MG tablet Take 1 tablet (75 mg total) by mouth daily with breakfast. 02/06/13   Dann Candyce RAMAN, MD  diphenhydramine -acetaminophen  (TYLENOL  PM) 25-500 MG TABS tablet Take 2 tablets by mouth at bedtime.    [provider]  doxycycline  (VIBRAMYCIN ) 100 MG capsule Take 1 capsule (100 mg total) by mouth 2 (two) times daily for 10 days. 07/17/24 07/27/24  Teddy Sharper, FNP  eplerenone  (INSPRA ) 50 MG tablet TAKE 1 TABLET BY MOUTH EVERY DAY 04/27/24   McLean, Dalton S, MD  ezetimibe  (ZETIA ) 10 MG tablet TAKE 1 TABLET BY MOUTH EVERY DAY 08/02/23   Darron Deatrice LABOR, MD  FOSAMAX 70 MG tablet Take 70 mg by mouth once a week. 04/29/23   [provider]  JARDIANCE  10 MG TABS tablet TAKE 1 TABLET BY MOUTH EVERY DAY 03/25/24   Rolan Ezra RAMAN, MD  methimazole  (TAPAZOLE ) 5 MG tablet Take 1 tablet (5 mg total) by mouth daily. 06/24/24   Colette Torrence GRADE, MD  mexiletine (MEXITIL) 150 MG capsule TAKE 1 CAPSULE BY MOUTH TWICE A DAY 04/29/24   Rolan Ezra RAMAN, MD  mirabegron  ER (MYRBETRIQ ) 50 MG TB24 tablet Take 50 mg by mouth daily.    [provider]  Multiple Vitamins-Minerals (ONE-A-DAY MENS 50+ ADVANTAGE PO) Take  1 tablet by mouth daily.    [provider]  PARoxetine  (PAXIL ) 40 MG tablet Take 40 mg by mouth daily. 10/18/16   [provider]  rosuvastatin  (CRESTOR ) 40 MG tablet Take 1 tablet (40 mg total) by mouth daily. 08/26/18   Darron Deatrice LABOR, MD  sacubitril -valsartan  (ENTRESTO ) 97-103 MG TAKE 1 TABLET BY MOUTH TWICE A DAY 04/29/24   Rolan Ezra RAMAN, MD  sodium chloride  (OCEAN) 0.65 % SOLN nasal spray Place 1 spray into both nostrils daily as needed for congestion.    [provider]  venlafaxine  XR (EFFEXOR -XR) 75 MG 24 hr capsule Take 75 mg by mouth daily. 07/13/18   [provider]    Family History Family History  Problem Relation Age of Onset   Cancer Mother  Social History Social History   Tobacco Use   Smoking status: Every Day    Current packs/day: 1.00    Average packs/day: 1 pack/day for 48.0 years (48.0 ttl pk-yrs)    Types: Cigars, Cigarettes    Passive exposure: Current   Smokeless tobacco: Never  Vaping Use   Vaping status: Never Used  Substance Use Topics   Alcohol use: Yes    Alcohol/week: 7.0 standard drinks of alcohol    Types: 7 drink(s) per week   Drug use: No     Allergies   Spironolactone  and Sulfa antibiotics   Review of Systems Review of Systems  Skin:  Positive for wound.  All other systems reviewed and are negative.    Physical Exam Triage Vital Signs ED Triage Vitals  Encounter Vitals Group     BP      Girls Systolic BP Percentile      Girls Diastolic BP Percentile      Boys Systolic BP Percentile      Boys Diastolic BP Percentile      Pulse      Resp      Temp      Temp src      SpO2      Weight      Height      Head Circumference      Peak Flow      Pain Score      Pain Loc      Pain Education      Exclude from Growth Chart    No data found.  Updated Vital Signs BP 95/61   Pulse 61   Temp 98.7 F (37.1 C)   Resp 19   SpO2 98%    Physical Exam Vitals and nursing note  reviewed.  Constitutional:      General: He is not in acute distress.    Appearance: Normal appearance. He is normal weight. He is not ill-appearing.  HENT:     Head: Normocephalic and atraumatic.     Mouth/Throat:     Mouth: Mucous membranes are moist.     Pharynx: Oropharynx is clear.  Eyes:     Extraocular Movements: Extraocular movements intact.     Pupils: Pupils are equal, round, and reactive to light.  Cardiovascular:     Rate and Rhythm: Normal rate and regular rhythm.     Heart sounds: Normal heart sounds.  Pulmonary:     Effort: Pulmonary effort is normal.     Breath sounds: Normal breath sounds. No wheezing, rhonchi or rales.  Musculoskeletal:        General: Normal range of motion.  Skin:    General: Skin is warm.     Comments: Left lower arm (lateral aspect from elbow to mid forearm): Well-healing skin avulsions noted with 75% eschar formation-please see images below  Neurological:     General: No focal deficit present.     Mental Status: He is alert and oriented to person, place, and time.  Psychiatric:        Mood and Affect: Mood normal.        Behavior: Behavior normal.         UC Treatments / Results  Labs (all labs ordered are listed, but only abnormal results are displayed) Labs Reviewed - No data to display  EKG   Radiology No results found.  Procedures Procedures (including critical care time)  Medications Ordered in UC Medications - No data to display  Initial  Impression / Assessment and Plan / UC Course  I have reviewed the triage vital signs and the nursing notes.  Pertinent labs & imaging results that were available during my care of the patient were reviewed by me and considered in my medical decision making (see chart for details).     MDM: 1.  Visit for wound check-Advised patient to continue previously prescribed doxycycline  with food to completion.  Advised patient to follow-up for wound check on Wednesday, 07/22/2024.  Patient  discharged home, hemodynamically stable. Final Clinical Impressions(s) / UC Diagnoses   Final diagnoses:  Visit for wound check     Discharge Instructions      Advised patient to continue previously prescribed doxycycline  with food to completion.  Advised patient to follow-up for wound check on Wednesday, 07/22/2024.      ED Prescriptions   None    PDMP not reviewed this encounter.   Teddy Sharper, FNP 07/19/24 [phone]

## 2024-07-19 NOTE — Discharge Instructions (Addendum)
 Advised patient to continue previously prescribed doxycycline  with food to completion.  Advised patient to follow-up for wound check on Wednesday, 07/22/2024.

## 2024-07-21 ENCOUNTER — Other Ambulatory Visit: Payer: Self-pay | Admitting: Cardiovascular Disease

## 2024-07-21 ENCOUNTER — Ambulatory Visit: Admitting: Pharmacist

## 2024-07-22 ENCOUNTER — Ambulatory Visit
Admission: EM | Admit: 2024-07-22 | Discharge: 2024-07-22 | Disposition: A | Discharge status: Home / Self Care | Attending: Family Medicine | Admitting: Family Medicine

## 2024-07-22 VITALS — BP 110/89 | HR 89 | Temp 97.4°F | Resp 16

## 2024-07-22 DIAGNOSIS — Z5189 Encounter for other specified aftercare: Secondary | ICD-10-CM | POA: Diagnosis not present

## 2024-07-22 NOTE — ED Notes (Signed)
 Mepliex applied to elbow and telfa to two lower wounds on forearm per ragan instructions

## 2024-07-22 NOTE — ED Triage Notes (Signed)
Pt presents for wound check.  

## 2024-07-22 NOTE — ED Notes (Signed)
 Removed wound dressing

## 2024-07-22 NOTE — Discharge Instructions (Addendum)
 Advised patient/wife please let wounds to heal by secondary intention/scab formation, please leave open to air is much as possible.  Advised patient may use Mepilix bandages when needing to go out/protecting wound areas.  Advised if symptoms worsen and or unresolved please follow-up with your PCP or here for further evaluation.

## 2024-07-22 NOTE — ED Provider Notes (Addendum)
 [facility] Her[facility]    CSN: [id number] Arrival date & time: 07/22/24  9076      History   Chief Complaint Chief Complaint  Patient presents with   Wound Check    HPI Nicholas Herrera is a 83 y.o. male.   HPI Very pleasant 83 year old male presents with left arm wound check/follow-up/skin avulsion.  Patient is accompanied by his wife this morning.  PMH significant for CHF.  Past Medical History:  Diagnosis Date   Abdominal distention    Abdominal pain    Bronchitis    hx of   Cancer (HCC)    skin   CHF (congestive heart failure) (HCC)     (hx of EF 27%) s/p AICD 2004. 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     on Methimazole    Inguinal hernia    Left groin pain    Osteoporosis    on Fosamax since 2024, due for BMD in 2026    Patient Active Problem List   Diagnosis Date Noted   Atherosclerotic heart disease of native coronary artery without angina pectoris 08/23/2020   Hyperglycemia 08/23/2020   Abscess of sigmoid colon due to diverticulitis 11/12/2016   Hematuria 11/24/2014   Chronic combined systolic and diastolic heart failure (HCC) 11/24/2014   Essential hypertension 11/24/2014   Peripheral vascular disease (HCC) 09/03/2013   Tobacco use disorder 09/03/2013   Hyperlipidemia    Cancer (HCC)    Dysrhythmia    Depression    Hernia 07/30/2011    Past Surgical History:  Procedure Laterality Date   ABDOMINAL AORTOGRAM W/LOWER EXTREMITY N/A 09/03/2018   Procedure: ABDOMINAL AORTOGRAM W/LOWER EXTREMITY;  Surgeon: Darron Deatrice LABOR, MD;  Location: [facility];  Service: Cardiovascular;  Laterality: N/A;   ABDOMINAL AORTOGRAM W/LOWER EXTREMITY N/A 07/29/2019    Procedure: ABDOMINAL AORTOGRAM W/LOWER EXTREMITY;  Surgeon: Darron Deatrice LABOR, MD;  Location: [facility];  Service: Cardiovascular;  Laterality: N/A;   ABDOMINAL AORTOGRAM W/LOWER EXTREMITY Bilateral 03/23/2020   Procedure: ABDOMINAL AORTOGRAM W/LOWER EXTREMITY;  Surgeon: Darron Deatrice LABOR, MD;  Location: [facility];  Service: Cardiovascular;  Laterality: Bilateral;   ABDOMINAL AORTOGRAM W/LOWER EXTREMITY N/A 11/22/2021   Procedure: ABDOMINAL AORTOGRAM W/LOWER EXTREMITY;  Surgeon: Darron Deatrice LABOR, MD;  Location: [facility];  Service: Cardiovascular;  Laterality: N/A;   APPENDECTOMY     CARDIAC CATHETERIZATION     CARDIAC DEFIBRILLATOR REMOVAL  2005   HERNIA REPAIR  09/11/11   repair of [facility]    INGUINAL HERNIA REPAIR  09/11/2011   Procedure: HERNIA REPAIR INGUINAL ADULT;  Surgeon: Redell Alm Faith, DO;  Location: [facility] OR;  Service: General;  Laterality: Left;  open left inguinal hernia with mesh   LOWER EXTREMITY ANGIOGRAM N/A 02/05/2013   Procedure: LOWER EXTREMITY ANGIOGRAM;  Surgeon: Candyce GORMAN Reek, MD;  Location: [facility];  Service: Cardiovascular;  Laterality: N/A;   PERIPHERAL VASCULAR BALLOON ANGIOPLASTY  07/29/2019   Procedure: PERIPHERAL VASCULAR BALLOON ANGIOPLASTY;  Surgeon: Darron Deatrice LABOR, MD;  [facility]: [facility];  Service: Cardiovascular;;   PERIPHERAL VASCULAR BALLOON ANGIOPLASTY  11/22/2021   Procedure: PERIPHERAL VASCULAR BALLOON ANGIOPLASTY;  Surgeon: Darron Deatrice LABOR, MD;  [facility]: [facility];  Service: Cardiovascular;;   PERIPHERAL VASCULAR INTERVENTION Left 09/03/2018   Procedure: PERIPHERAL VASCULAR INTERVENTION;  Surgeon: Darron Deatrice LABOR, MD;  [facility]: [facility];  Service: Cardiovascular;  Laterality: Left;   PERIPHERAL VASCULAR INTERVENTION Right 03/23/2020   Procedure: PERIPHERAL VASCULAR INTERVENTION;  Surgeon: Darron Deatrice LABOR, MD;  [facility]: [facility];  Service: Cardiovascular;  Laterality: Right;    RIGHT/LEFT HEART CATH AND CORONARY ANGIOGRAPHY N/A 12/15/2019   Procedure: RIGHT/LEFT HEART CATH AND CORONARY ANGIOGRAPHY;  Surgeon: Claudene Victory ORN, MD;  Location: [facility];  Service: Cardiovascular;  Laterality: N/A;       Home Medications    Prior to Admission medications   Medication Sig Start Date End Date Taking? Authorizing Provider  acetaminophen  (TYLENOL ) 650 MG CR tablet Take 1,300 mg by mouth in the morning.    [provider]  Calcium  200 MG TABS Take 200 mg by mouth daily. 1000 mg IV-VO    [provider]  carvedilol  (COREG ) 12.5 MG tablet TAKE 1 TABLET BY MOUTH 2 TIMES DAILY. 01/28/24   Glena Harlene HERO, FNP  clopidogrel  (PLAVIX ) 75 MG tablet Take 1 tablet (75 mg total) by mouth daily with breakfast. 02/06/13   Dann Candyce RAMAN, MD  diphenhydramine -acetaminophen  (TYLENOL  PM) 25-500 MG TABS tablet Take 2 tablets by mouth at bedtime.    [provider]  doxycycline  (VIBRAMYCIN ) 100 MG capsule Take 1 capsule (100 mg total) by mouth 2 (two) times daily for 10 days. 07/17/24 07/27/24  Teddy Sharper, FNP  eplerenone  (INSPRA ) 50 MG tablet TAKE 1 TABLET BY MOUTH EVERY DAY 04/27/24   McLean, Dalton S, MD  ezetimibe  (ZETIA ) 10 MG tablet TAKE 1 TABLET BY MOUTH EVERY DAY 07/21/24   Darron Deatrice LABOR, MD  FOSAMAX 70 MG tablet Take 70 mg by mouth once a week. 04/29/23   [provider]  JARDIANCE  10 MG TABS tablet TAKE 1 TABLET BY MOUTH EVERY DAY 03/25/24   Rolan Ezra RAMAN, MD  methimazole  (TAPAZOLE ) 5 MG tablet Take 1 tablet (5 mg total) by mouth daily. 06/24/24   Colette Torrence GRADE, MD  mexiletine (MEXITIL) 150 MG capsule TAKE 1 CAPSULE BY MOUTH TWICE A DAY 04/29/24   Rolan Ezra RAMAN, MD  mirabegron  ER (MYRBETRIQ ) 50 MG TB24 tablet Take 50 mg by mouth daily.    [provider]  Multiple Vitamins-Minerals (ONE-A-DAY MENS 50+ ADVANTAGE PO) Take 1 tablet by mouth daily.    [provider]  PARoxetine  (PAXIL ) 40 MG tablet Take 40 mg by  mouth daily. 10/18/16   [provider]  rosuvastatin  (CRESTOR ) 40 MG tablet Take 1 tablet (40 mg total) by mouth daily. 08/26/18   Darron Deatrice LABOR, MD  sacubitril -valsartan  (ENTRESTO ) 97-103 MG TAKE 1 TABLET BY MOUTH TWICE A DAY 04/29/24   Rolan Ezra RAMAN, MD  sodium chloride  (OCEAN) 0.65 % SOLN nasal spray Place 1 spray into both nostrils daily as needed for congestion.    [provider]  venlafaxine  XR (EFFEXOR -XR) 75 MG 24 hr capsule Take 75 mg by mouth daily. 07/13/18   [provider]    Family History Family History  Problem Relation Age of Onset   Cancer Mother     Social History Social History   Tobacco Use   Smoking status: Every Day  Current packs/day: 1.00    Average packs/day: 1 pack/day for 48.0 years (48.0 ttl pk-yrs)    Types: Cigars, Cigarettes    Passive exposure: Current   Smokeless tobacco: Never  Vaping Use   Vaping status: Never Used  Substance Use Topics   Alcohol use: Yes    Alcohol/week: 7.0 standard drinks of alcohol    Types: 7 drink(s) per week   Drug use: No     Allergies   Spironolactone  and Sulfa antibiotics   Review of Systems Review of Systems  Skin:  Positive for wound.     Physical Exam Triage Vital Signs ED Triage Vitals  Encounter Vitals Group     BP      Girls Systolic BP Percentile      Girls Diastolic BP Percentile      Boys Systolic BP Percentile      Boys Diastolic BP Percentile      Pulse      Resp      Temp      Temp src      SpO2      Weight      Height      Head Circumference      Peak Flow      Pain Score      Pain Loc      Pain Education      Exclude from Growth Chart    No data found.  Updated Vital Signs BP 110/89   Pulse 89   Temp (!) 97.4 F (36.3 C)   Resp 16   SpO2 98%    Physical Exam Vitals and nursing note reviewed.  Constitutional:      Appearance: Normal appearance. He is normal weight.  HENT:     Head: Normocephalic and atraumatic.      Mouth/Throat:     Mouth: Mucous membranes are moist.     Pharynx: Oropharynx is clear.  Eyes:     Extraocular Movements: Extraocular movements intact.     Conjunctiva/sclera: Conjunctivae normal.     Pupils: Pupils are equal, round, and reactive to light.  Cardiovascular:     Rate and Rhythm: Normal rate and regular rhythm.     Pulses: Normal pulses.     Heart sounds: Normal heart sounds.  Pulmonary:     Effort: Pulmonary effort is normal.     Breath sounds: Normal breath sounds. No wheezing, rhonchi or rales.  Musculoskeletal:        General: Normal range of motion.  Skin:    General: Skin is warm and dry.     Comments: Left lower arm (lateral superior aspect over olecranon): Eschar formation noted once Xeroform removed well-healing  Neurological:     General: No focal deficit present.     Mental Status: He is alert and oriented to person, place, and time. Mental status is at baseline.  Psychiatric:        Mood and Affect: Mood normal.        Behavior: Behavior normal.         UC Treatments / Results  Labs (all labs ordered are listed, but only abnormal results are displayed) Labs Reviewed - No data to display  EKG   Radiology No results found.  Procedures Procedures (including critical care time)  Medications Ordered in UC Medications - No data to display  Initial Impression / Assessment and Plan / UC Course  I have reviewed the triage vital signs and the nursing notes.  Pertinent  labs & imaging results that were available during my care of the patient were reviewed by me and considered in my medical decision making (see chart for details).     MDM: 1.  Visit for wound-Advised patient/wife please let wounds to heal by secondary intention/scab formation, please leave open to air is much as possible.  Advised patient may use Mepilix bandages when needing to go out/protecting wound areas.  Advised if symptoms worsen and or unresolved please follow-up with your  PCP or here for further evaluation.  Patient discharged home, hemodynamically stable. Final Clinical Impressions(s) / UC Diagnoses   Final diagnoses:  Visit for wound check     Discharge Instructions      Advised patient/wife please let wounds to heal by secondary intention/scab formation, please leave open to air is much as possible.  Advised patient may use Mepilix bandages when needing to go out/protecting wound areas.  Advised if symptoms worsen and or unresolved please follow-up with your PCP or here for further evaluation.     ED Prescriptions   None    PDMP not reviewed this encounter.   Teddy Sharper, FNP 07/22/24 9047    Teddy Sharper, FNP 07/22/24 1003

## 2024-08-23 NOTE — Progress Notes (Unsigned)
 Patient ID: Nicholas Herrera                 DOB: Mar 23, 1941                    MRN: [id number]      HPI: Nicholas Herrera is a 83 y.o. male patient referred to lipid clinic by Dr.McLean PMH is significant for PAD, CHF, CKD, HTN, HLD.    LDL was 71 in 5/25, would like to see LDL < 55 with vascular disease.  - Referred to lipid clinic   LHC/RHC in 2/21 showed nonobstructive CAD, Peripheral angiography in 5/21 with stent to the right external iliac artery, Echo - 06/11/24 EF 30-35%, RV normal size and systolic function. NYHA class I  Patient presented today in good spirit. Reports he tolerates current therapy well without side effect  Reviewed options for lowering LDL cholesterol, including  PCSK-9 inhibitors, bempedoic acid and inclisiran.  Discussed mechanisms of action, dosing, side effects and potential decreases in LDL cholesterol.  Also reviewed cost information and potential options for patient assistance.  Lifestyle Counseling: The importance of limiting alcohol intake and quitting smoking was discussed in depth with the patient. he is actively working on making small, sustainable changes and is progressing well on this journey.  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 limits the activity   Family History:  Relation Problem Comments  Mother (Deceased) Cancer     Father (Deceased)    Social History:  Smoking:  Alcohol: 1/2 pack per day  Labs:  Lipid Panel     Component Value Date/Time    CHOL 132 09/12/2023 0946   CHOL 115 02/19/2020 0914   TRIG 57 09/12/2023 0946   HDL 57 09/12/2023 0946   HDL 50 02/19/2020 0914   CHOLHDL 2.3 09/12/2023 0946   VLDL 11 09/12/2023 0946   LDLCALC 64 09/12/2023 0946   LDLCALC 51 02/19/2020 0914   LABVLDL 14 02/19/2020 0914    Past Medical History:  Diagnosis Date   Abdominal distention    Abdominal pain    Bronchitis    hx of   Cancer (HCC)    skin   CHF (congestive heart failure) (HCC)     (hx of EF 27%) s/p AICD 2004. 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     Calcium  200 MG TABS Take 200 mg by mouth daily. 1000 mg IV-VO     carvedilol  (COREG ) 12.5 MG tablet TAKE 1 TABLET BY MOUTH 2 TIMES DAILY. 180 tablet 3   clopidogrel  (PLAVIX ) 75 MG tablet Take 1 tablet (75 mg total) by mouth daily with breakfast. 30 tablet 11   diphenhydramine -acetaminophen  (TYLENOL  PM) 25-500 MG TABS tablet Take 2 tablets by mouth at bedtime.     eplerenone  (INSPRA ) 50 MG tablet TAKE 1 TABLET BY MOUTH EVERY DAY 90 tablet 3   ezetimibe  (ZETIA ) 10 MG tablet TAKE 1 TABLET BY MOUTH EVERY DAY 90 tablet 3   FOSAMAX 70 MG tablet Take 70 mg by mouth once a week.     JARDIANCE  10 MG TABS tablet TAKE 1 TABLET BY MOUTH EVERY DAY 30 tablet 6   methimazole  (TAPAZOLE ) 5 MG tablet Take 1 tablet (5 mg total) by mouth daily.     mexiletine (MEXITIL) 150 MG capsule TAKE 1 CAPSULE BY MOUTH  TWICE A DAY 180 capsule 1   mirabegron  ER (MYRBETRIQ ) 50 MG TB24 tablet Take 50 mg by mouth daily.     Multiple Vitamins-Minerals (ONE-A-DAY MENS 50+ ADVANTAGE PO) Take 1 tablet by mouth daily.     PARoxetine  (PAXIL ) 40 MG tablet Take 40 mg by mouth daily.     rosuvastatin  (CRESTOR ) 40 MG tablet Take 1 tablet (40 mg total) by mouth daily. 90 tablet 3   sacubitril -valsartan  (ENTRESTO ) 97-103 MG TAKE 1 TABLET BY MOUTH TWICE A DAY 60 tablet 5   sodium chloride  (OCEAN) 0.65 % SOLN nasal spray Place 1 spray into both nostrils daily as needed for congestion.     venlafaxine  XR (EFFEXOR -XR) 75 MG 24 hr capsule Take 75 mg by mouth daily.     No current facility-administered medications on file prior to visit.    Allergies  Allergen Reactions   Spironolactone  Other (See Comments)    gynecomastia   Sulfa Antibiotics Other (See Comments)    Unknown reaction - childhood allergy     Assessment/Plan:  1. Hyperlipidemia -  Problem  Hyperlipidemia   takes zocor    Hyperlipidemia Assessment:  LDL goal: < 55 mg/dl last LDLc 71 mg/dl (94/7974) while on high intensity statins and Zetia   Tolerates Zetia  and high intensity statins well without any side effects  Given multiple risk factors LDLc goal is <55  Discussed next potential options (PCSK-9 inhibitors, bempedoic acid and inclisiran); cost, dosing efficacy, side effects  Lifestyle: the importance of limiting alcohol intake and quitting smoking was discussed in depth with the patient. he is actively working on making small, sustainable changes and is progressing well on this journey.  Plan: Continue taking current medications (Crestor  40 mg daily and Zetia  10 mg daily ) Will apply for PA for PCSK9i; will inform patient upon approval (prefers MyChart message) Lipid lab due in 2-3 months after starting PCSK9i    Thank you,  Robbi Blanch, Pharm.D [name]. [facility] [address],  [place], [place] [address] Phone: [phone]; Fax: [phone]

## 2024-08-24 ENCOUNTER — Ambulatory Visit: Attending: Cardiology | Admitting: Pharmacist

## 2024-08-24 ENCOUNTER — Telehealth: Payer: Self-pay | Admitting: Pharmacist

## 2024-08-24 ENCOUNTER — Other Ambulatory Visit (HOSPITAL_COMMUNITY): Payer: Self-pay

## 2024-08-24 ENCOUNTER — Encounter: Payer: Self-pay | Admitting: Pharmacist

## 2024-08-24 ENCOUNTER — Telehealth: Payer: Self-pay | Admitting: Pharmacy Technician

## 2024-08-24 DIAGNOSIS — E782 Mixed hyperlipidemia: Secondary | ICD-10-CM

## 2024-08-24 DIAGNOSIS — E7849 Other hyperlipidemia: Secondary | ICD-10-CM | POA: Diagnosis not present

## 2024-08-24 MED ORDER — REPATHA SURECLICK 140 MG/ML ~~LOC~~ SOAJ: 140.0000 mg | SUBCUTANEOUS | 3 refills | Status: AC

## 2024-08-24 NOTE — Telephone Encounter (Signed)
 Pharmacy Patient Advocate Encounter  Received notification from [facility] that Prior Authorization for Repatha has been APPROVED from 08/24/24 to 02/22/25. Ran test claim, Copay is $0.00- one month. This test claim was processed through [facility]- copay amounts may vary at other pharmacies due to pharmacy/plan contracts, or as the patient moves through the different stages of their insurance plan.   PA #/Case ID/Reference #: [id number]

## 2024-08-24 NOTE — Telephone Encounter (Signed)
 Patient made aware of PA approval and follow up lab is due on Nov 18, 2024.

## 2024-08-24 NOTE — Telephone Encounter (Signed)
 Will assess the coverage for PCSK9i

## 2024-08-24 NOTE — Assessment & Plan Note (Signed)
 Assessment:  LDL goal: < 55 mg/dl last LDLc 71 mg/dl (94/7974) while on high intensity statins and Zetia   Tolerates Zetia  and high intensity statins well without any side effects  Given multiple risk factors LDLc goal is <55  Discussed next potential options (PCSK-9 inhibitors, bempedoic acid and inclisiran); cost, dosing efficacy, side effects  Lifestyle: the importance of limiting alcohol intake and quitting smoking was discussed in depth with the patient. he is actively working on making small, sustainable changes and is progressing well on this journey.  Plan: Continue taking current medications (Crestor  40 mg daily and Zetia  10 mg daily ) Will apply for PA for PCSK9i; will inform patient upon approval (prefers MyChart message) Lipid lab due in 2-3 months after starting PCSK9i

## 2024-08-24 NOTE — Patient Instructions (Signed)
 Your Results:             Your most recent labs Goal  Total Cholesterol 143 < 200  Triglycerides 67 < 150  HDL (happy/good cholesterol) 59 > 40  LDL (lousy/bad cholesterol 71 < 55   Medication changes: continue taking Crestor  40 mg daily and Zetia  10 mg daily  We will start the process to get PCSK9i ( Repatha or Praluent)  covered by your insurance.  Once the prior authorization is complete, we will call you to let you know and confirm pharmacy information.     Praluent is a cholesterol medication that improved your body's ability to get rid of bad cholesterol known as LDL. It can lower your LDL up to 60%. It is an injection that is given under the skin every 2 weeks. The most common side effects of Praluent include runny nose, symptoms of the common cold, rarely flu or flu-like symptoms, back/muscle pain in about 3-4% of the patients, and redness, pain, or bruising at the injection site.    Repatha is a cholesterol medication that improved your body's ability to get rid of bad cholesterol known as LDL. It can lower your LDL up to 60%! It is an injection that is given under the skin every 2 weeks. The most common side effects of Repatha include runny nose, symptoms of the common cold, rarely flu or flu-like symptoms, back/muscle pain in about 3-4% of the patients, and redness, pain, or bruising at the injection site.   Lab orders: We want to repeat labs after 2-3 months.  We will send you a lab order to remind you once we get closer to that time.

## 2024-08-24 NOTE — Addendum Note (Signed)
 Addended by: Eivan Gallina K on: 08/24/2024 12:53 PM   Modules accepted: Orders

## 2024-08-24 NOTE — Telephone Encounter (Signed)
 Pharmacy Patient Advocate Encounter   Received notification from [employer] Call[employer] Messages that prior authorization for Repatha is required/requested.   Insurance verification completed.   The patient is insured through [employer].   Per test claim: PA required; PA submitted to above mentioned insurance via Latent Key/confirmation #/EOC [id number] Status is pending

## 2024-10-06 ENCOUNTER — Ambulatory Visit: Admitting: Cardiovascular Disease

## 2024-10-07 NOTE — Progress Notes (Signed)
 PCP: Colette Torrence GRADE, MD Cardiology: Dr. Claudene HF Cardiology: Dr. Rolan  83 y.o. with PAD, smoking, and a long history of cardiomyopathy. Patient was diagnosed with a cardiomyopathy prior to 2004 while he was living in [place] .  He had an ICD placed in 2004.  Per notes, EF was < 30% during this time.  He thinks he may have had a heart catheterization but does not remember the details.  The ICD subsequently malfunctioned and was turned off in 2009.  It is still implanted and has 2 nonfunctioning leads.  Echo in 2011 after moving the [facility] showed improved EF at 40-45%.  He did not have an echo over the next 9 years.  However, Echo done in 82/20 showed EF down to 20-25% with a moderately dilated LV.  He has been subsequently started on HF meds.  He saw Dr. Waddell to decide about explantation of old ICD/implantation of new ICD.  He was thought to be not a candidate for a new intravascular ICD.  Subcutaneous ICD would be a potential option but utility would like be limited with nonischemic cardiomyopathy at age 83.   Patient also has an extensive PAD history.  In 9/20 he had PTCA to left external and common iliac arteries.    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  He was well-compensated.   Peripheral angiography in 5/21 with stent to the right external iliac artery.   He had presumed diverticular bleeding in 10/21.   Holter in 3/22 with very frequent PVCs, 21% total beats.  Amiodarone  was started.  Repeat Zio monitor in 7/22 showed PVCs down to 1.8%.   Echo in 4/22 showed EF 25-30%, mild LV enlargement, normal RV.    In 1/23, he had balloon angioplasty of right CIA.   Echo in 9/23 showed EF 30-35%, mildly decreased RV systolic function, normal IVC.   Echo 12/24 showed EF 20-25%, low/normal RV, moderate pulmonic valve regurgitation.  Echo 8/25 EF 30-35%, RV normal size and systolic function.   Today he returns for HF follow up. Overall feeling fine.  No SOB walking short distances on flat ground. 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 He has had 6 falls this year, due to balance issues. He has left leg claudication-type pain. Denies palpitations, abnormal bleeding, CP, dizziness, edema, or PND/Orthopnea. Appetite ok. Weight at home 155 pounds. Taking all medications. Smokes 1/2 ppd. Wife struggling with pulmonary issues.  ECG (personally reviewed): none ordered today  Labs (9/23): K 4, creatinine 1.04 Labs (5/24): K 4.3, creatinine 1.09, LFTs normal, hgb 14.2 Labs (11/24): K 4.1, creatinine 1.46, LDL 64 Labs (5/25): K 4, creatinine 0.98, hgb 14.1, LFTs normal, LDL 71 Labs (8/25): K 4.1, creatinine 1.06  PMH:  1. HTN 2. Hyperlipidemia 3. Active smoker 4. PAD: Right popliteal stent in 2014 with subsequent occlusion.   - Orbital atherectomy + drug coated balloon angioplasty of left SFA in 2019.   - In 9/20, had PTCA left external and common iliac arteries.  - Peripheral angiogram in 5/21 with stent to right external iliac artery.  - 1/22 peripheral arterial dopplers with significant bilateral iliac disease.  - 7/22 peripheral arterial dopplers with moderate stenosis right EIA stent.  - 1/23 PTCA to right CIA.  - ABIs (9/23): right ABI 0.58, left ABI 0.84; patent right iliac stent.  - ABIs (9/24): right 0.66, left 0.6 5. CKD: Stage 3.  6. Cardiomyopathy: Patient had an ICD placed in [place]  in 2004,  so presumably had a pre-dating cardiomyopathy.  He thinks that he had a heart cath in [place]  but is not sure.  EF <30% when he moved to [place].  - Echo (2011) with EF improved to 40-45%.  - Echo (7/20): EF 20-25%, moderate LV dilation, normal RV size and systolic function.  - Patient has 2 nonfunctioning ICD leads (ICD is not functional, turned off in 2009).  - LHC/RHC (2/21): Nonobstructive CAD.  Mean RA 3, PA 25/5, mean PCWP 7, CI 2.4.  - Echo (4/22): EF 25-30%, mild LV enlargement, normal RV. - Echo (9/23): EF  30-35%, mildly decreased RV systolic function, normal IVC. - Echo (12/24): EF 20-25%, low/normal RV, moderate pulmonic valve regurgitation - Echo (8/25): EF 30-35%, normal RV size/systolic function 7. Type 2 diabetes 8. Diverticular bleeding in 10/21.  9. PVCs: Zio patch 3/22 showed 21% PVCs with 15 short NSVT runs.  - Zio monitor (7/22): 1.8% PVCs on amiodarone .  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   SH: Retired 15 years ago from [employer]  Married with 3 children, smokes about 1/2-1 ppd still, has had about 3 drinks/day   FH: Mother with cancer, father with COPD.  No heart disease that he knows of.   ROS: All systems reviewed and negative except as per HPI.   Current Outpatient Medications  Medication Sig Dispense Refill   acetaminophen  (TYLENOL ) 650 MG CR tablet Take 1,300 mg by mouth in the morning.     Calcium  200 MG TABS Take 200 mg by mouth daily. 1000 mg IV-VO     carvedilol  (COREG ) 12.5 MG tablet TAKE 1 TABLET BY MOUTH 2 TIMES DAILY. 180 tablet 3   clopidogrel  (PLAVIX ) 75 MG tablet Take 1 tablet (75 mg total) by mouth daily with breakfast. 30 tablet 11   diphenhydramine -acetaminophen  (TYLENOL  PM) 25-500 MG TABS tablet Take 2 tablets by mouth at bedtime.     eplerenone  (INSPRA ) 50 MG tablet TAKE 1 TABLET BY MOUTH EVERY DAY 90 tablet 3   Evolocumab  (REPATHA  SURECLICK) 140 MG/ML SOAJ Inject 140 mg into the skin every 14 (fourteen) days. 6 mL 3   ezetimibe  (ZETIA ) 10 MG tablet TAKE 1 TABLET BY MOUTH EVERY DAY 90 tablet 3   FOSAMAX 70 MG tablet Take 70 mg by mouth once a week.     JARDIANCE  10 MG TABS tablet TAKE 1 TABLET BY MOUTH EVERY DAY 30 tablet 6   methimazole  (TAPAZOLE ) 5 MG tablet Take 1 tablet (5 mg total) by mouth daily.     mexiletine (MEXITIL) 150 MG capsule TAKE 1 CAPSULE BY MOUTH TWICE A DAY 180 capsule 1   mirabegron  ER (MYRBETRIQ ) 50 MG TB24 tablet Take 50 mg by mouth daily.     Multiple Vitamins-Minerals (ONE-A-DAY MENS 50+ ADVANTAGE PO)  Take 1 tablet by mouth daily.     PARoxetine  (PAXIL ) 40 MG tablet Take 40 mg by mouth daily.     rosuvastatin  (CRESTOR ) 40 MG tablet Take 1 tablet (40 mg total) by mouth daily. 90 tablet 3   sacubitril -valsartan  (ENTRESTO ) 97-103 MG TAKE 1 TABLET BY MOUTH TWICE A DAY 60 tablet 5   sodium chloride  (OCEAN) 0.65 % SOLN nasal spray Place 1 spray into both nostrils daily as needed for congestion.     venlafaxine  XR (EFFEXOR -XR) 75 MG 24 hr capsule Take 75 mg by mouth daily.     No current facility-administered medications for this encounter.   Wt Readings from Last 3 Encounters:  10/12/24 70.3 kg (155 lb)  06/24/24 69.6 kg (153 lb 6.4 oz)  06/11/24 69.9 kg (154 lb)   BP 98/62   Pulse 75   Ht 5' 9 (1.753 m)   Wt 70.3 kg (155 lb)   SpO2 95%   BMI 22.89 kg/m  Physical Exam General:  NAD. No resp difficulty, walked into clinic with cane, elderly HEENT: Normal Neck: Supple. No JVD. Cor: Regular rate & rhythm. No rubs, gallops or murmurs. Lungs: Clear, diminished. Abdomen: Soft, nontender, nondistended.  Extremities: No cyanosis, clubbing, rash, edema Neuro: Alert & oriented x 3, moves all 4 extremities w/o difficulty. Affect pleasant.  Assessment/Plan: 1. Chronic systolic CHF:  Patient has had a known nonischemic cardiomyopathy since prior to 2004.  EF had improved to 40-45% in 2011 but had dropped significantly on echo in 7/20 with EF 20-25%. He has a nonfunctioning ICD.  Cause of the cardiomyopathy is uncertain.  Coronary angiography in 2/21 showed no significant coronary disease. He has no family history of cardiomyopathy.  RHC in 2/21 showed normal filling pressures and normal cardiac output.  Echo in 4/22 with EF still low at 25-30%. PVCs likely contributed to his cardiomyopathy, Zio patch in 3/22 with 21% PVCs.  Echo in 9/23 showed EF 30-35%, mildly decreased RV systolic function, normal IVC. Echo 12/24 showed EF 20-25%, low/normal RV, moderate pulmonic valve regurgitation. Echo 8/25  showed EF 30-35%, normal RV.  NYHA class II, most limited by pain and balance issues.  Not volume overloaded by exam.  - Continue Coreg  12.5 mg bid, orthostatic symptoms at higher doses  - Continue eplerenone  50 mg daily. BMET/BNP today.   - Continue Entresto  97/103 bid.   - Continue Jardiance  10 mg daily. No GU symptoms. - Patient is not a candidate for another intravascular ICD.  Limited utility to implanting subcutaneous ICD in the setting of a nonischemic cardiomyopathy with advanced age.   2. PAD:  R CIA PTCA in 1/23.  Unfortunately, still smoking.  ABIs in 9/24 were mildly worse.  No rest pain or pedal ulcerations.  Symptoms are stable. He has follow up with Dr. Darron for PV.  - Stay active, walk through pain at least a short distance.  - Continue Plavix  75 mg daily. - Crestor  40 mg daily + Zetia  10 mg daily.  - Needs to quit smoking => he has been unable to do this. Has cut back to 1/2 ppd. 3. HTN: BP controlled.  - Continue current medications. 4. Hyperlipidemia: Continue Crestor  and Zetia . LDL was 71 in 5/25, would like to see LDL < 55 with vascular disease.  - He is now on Repatha . Repeat Lipids arranged 10/2024 5. PVCs: Frequent in past, may contribute to cardiomyopathy.  7/22 Zio monitor on amiodarone  showed PVC percentage down to 1.8%.  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  He is now on mexiletine, no further palpitations.  - Continue mexiletine 150 mg bid.   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  He is now off amiodarone  and taking methimazole .  - Continue followup with endocrinology.  7. Osteoarthritis: Hip pain, try to limit Celebrex as much as possible.  - Tylenol  is ok, we discussed cutting back on Tylenol  PM especially with frequent falls.  Follow up in 6 months with Dr. Rolan Raisin [facility] FNP-BC 10/12/2024

## 2024-10-09 ENCOUNTER — Telehealth (HOSPITAL_COMMUNITY): Payer: Self-pay

## 2024-10-09 NOTE — Telephone Encounter (Signed)
 Called to confirm/remind patient of their appointment at the [facility] on 10/12/24.   Appointment:   [] Confirmed  [x] Left mess   [] No answer/No voice mail  [] VM Full/unable to leave message  [] Phone not in service  And to bring in all medications and/or complete list.

## 2024-10-12 ENCOUNTER — Ambulatory Visit (HOSPITAL_COMMUNITY): Payer: Self-pay | Admitting: Family Medicine

## 2024-10-12 ENCOUNTER — Encounter (HOSPITAL_COMMUNITY): Payer: Self-pay

## 2024-10-12 ENCOUNTER — Inpatient Hospital Stay (HOSPITAL_COMMUNITY): Admission: RE | Admit: 2024-10-12 | Discharge: 2024-10-12 | Attending: Family Medicine

## 2024-10-12 VITALS — BP 98/62 | HR 75 | Ht 69.0 in | Wt 155.0 lb

## 2024-10-12 DIAGNOSIS — Z7984 Long term (current) use of oral hypoglycemic drugs: Secondary | ICD-10-CM | POA: Diagnosis not present

## 2024-10-12 DIAGNOSIS — T82111S Breakdown (mechanical) of cardiac pulse generator (battery), sequela: Secondary | ICD-10-CM | POA: Diagnosis not present

## 2024-10-12 DIAGNOSIS — I429 Cardiomyopathy, unspecified: Secondary | ICD-10-CM | POA: Diagnosis present

## 2024-10-12 DIAGNOSIS — I5022 Chronic systolic (congestive) heart failure: Secondary | ICD-10-CM

## 2024-10-12 DIAGNOSIS — Z7902 Long term (current) use of antithrombotics/antiplatelets: Secondary | ICD-10-CM | POA: Diagnosis not present

## 2024-10-12 DIAGNOSIS — I10 Essential (primary) hypertension: Secondary | ICD-10-CM | POA: Diagnosis not present

## 2024-10-12 DIAGNOSIS — I739 Peripheral vascular disease, unspecified: Secondary | ICD-10-CM

## 2024-10-12 DIAGNOSIS — E785 Hyperlipidemia, unspecified: Secondary | ICD-10-CM | POA: Diagnosis not present

## 2024-10-12 DIAGNOSIS — I428 Other cardiomyopathies: Secondary | ICD-10-CM | POA: Diagnosis not present

## 2024-10-12 DIAGNOSIS — E0590 Thyrotoxicosis, unspecified without thyrotoxic crisis or storm: Secondary | ICD-10-CM | POA: Diagnosis not present

## 2024-10-12 DIAGNOSIS — M1990 Unspecified osteoarthritis, unspecified site: Secondary | ICD-10-CM | POA: Diagnosis not present

## 2024-10-12 DIAGNOSIS — I493 Ventricular premature depolarization: Secondary | ICD-10-CM

## 2024-10-12 DIAGNOSIS — M25559 Pain in unspecified hip: Secondary | ICD-10-CM | POA: Diagnosis not present

## 2024-10-12 DIAGNOSIS — Z95820 Peripheral vascular angioplasty status with implants and grafts: Secondary | ICD-10-CM | POA: Diagnosis not present

## 2024-10-12 DIAGNOSIS — F17210 Nicotine dependence, cigarettes, uncomplicated: Secondary | ICD-10-CM | POA: Diagnosis not present

## 2024-10-12 DIAGNOSIS — I110 Hypertensive heart disease with heart failure: Secondary | ICD-10-CM | POA: Diagnosis not present

## 2024-10-12 DIAGNOSIS — Z791 Long term (current) use of non-steroidal anti-inflammatories (NSAID): Secondary | ICD-10-CM | POA: Diagnosis not present

## 2024-10-12 LAB — BASIC METABOLIC PANEL WITH GFR
Anion gap: 10 (ref 5–15)
BUN: 19 mg/dL (ref 8–23)
CO2: 30 mmol/L (ref 22–32)
Calcium: 8.9 mg/dL (ref 8.9–10.3)
Chloride: 100 mmol/L (ref 98–111)
Creatinine, Ser: 1.15 mg/dL (ref 0.61–1.24)
GFR, Estimated: >60 mL/min (ref >60)
Glucose, Bld: 104 mg/dL — ABNORMAL HIGH (ref 70–99)
Potassium: 4.2 mmol/L (ref 3.5–5.1)
Sodium: 140 mmol/L (ref 135–145)

## 2024-10-12 LAB — BRAIN NATRIURETIC PEPTIDE: B Natriuretic Peptide: 120.1 pg/mL — ABNORMAL HIGH (ref 0.0–100.0)

## 2024-10-12 NOTE — Patient Instructions (Addendum)
 Thank you for coming in today  If you had labs drawn today, any labs that are abnormal the clinic will call you No news is good news  Medications: No changes   Follow up appointments:  Your physician recommends that you schedule a follow-up appointment in:  6 months With Dr. Rolan Please call our office to schedule the follow-up appointment in March 2026 for June 2026.    Do the following things EVERYDAY: Weigh yourself in the morning before breakfast. Write it down and keep it in a log. Take your medicines as prescribed Eat low salt foods--Limit salt (sodium) to 2000 mg per day.  Stay as active as you can everyday Limit all fluids for the day to less than 2 liters   At the [facility], you and your health needs are our priority. As part of our continuing mission to provide you with exceptional heart care, we have created designated Provider Care Teams. These Care Teams include your primary Cardiologist (physician) and Advanced Practice Providers (APPs- Physician Assistants and Nurse Practitioners) who all work together to provide you with the care you need, when you need it.   You may see any of the following providers on your designated Care Team at your next follow up: Dr Toribio Fuel Dr Ezra Rolan Dr. Ria Gardenia Greig Lenetta, NP Caffie Shed, [place] [facility] [place], [place] Beckey Coe, NP Tinnie Redman, PharmD   Please be sure to bring in all your medications bottles to every appointment.    Thank you for choosing [facility]  If you have any questions or concerns before your next appointment please send us  a message through [place] or call our office at [phone].    TO LEAVE A MESSAGE FOR THE NURSE SELECT OPTION 2, PLEASE LEAVE A MESSAGE INCLUDING: YOUR NAME DATE OF BIRTH CALL BACK NUMBER REASON FOR CALL**this is important as we prioritize the call backs  YOU WILL RECEIVE A CALL  BACK THE SAME DAY AS LONG AS YOU CALL BEFORE 4:00 PM

## 2024-10-13 ENCOUNTER — Encounter: Payer: Self-pay | Admitting: Cardiovascular Disease

## 2024-10-13 ENCOUNTER — Other Ambulatory Visit (HOSPITAL_COMMUNITY): Payer: Self-pay | Admitting: Cardiology

## 2024-10-13 ENCOUNTER — Ambulatory Visit: Attending: Cardiovascular Disease | Admitting: Cardiovascular Disease

## 2024-10-13 VITALS — BP 110/74 | HR 68 | Ht 69.0 in | Wt 155.0 lb

## 2024-10-13 DIAGNOSIS — I10 Essential (primary) hypertension: Secondary | ICD-10-CM

## 2024-10-13 DIAGNOSIS — E785 Hyperlipidemia, unspecified: Secondary | ICD-10-CM

## 2024-10-13 DIAGNOSIS — I739 Peripheral vascular disease, unspecified: Secondary | ICD-10-CM

## 2024-10-13 DIAGNOSIS — I5022 Chronic systolic (congestive) heart failure: Secondary | ICD-10-CM

## 2024-10-13 DIAGNOSIS — I2510 Atherosclerotic heart disease of native coronary artery without angina pectoris: Secondary | ICD-10-CM

## 2024-10-13 DIAGNOSIS — Z720 Tobacco use: Secondary | ICD-10-CM

## 2024-10-13 NOTE — Progress Notes (Signed)
 Cardiology Office Note   Date:  10/13/2024   ID:  Nicholas Herrera, DOB Apr 06, 1941, MRN [id number]  PCP:  Colette Torrence GRADE, MD  Cardiologist: Dr. Rolan  No chief complaint on file.      History of Present Illness: Nicholas Herrera is a 83 y.o. male who is here today for a follow-up visit regarding peripheral arterial disease. He has known history of coronary artery disease, chronic systolic/diastolic heart failure, tobacco use and hypertension.  He is on amiodarone  therapy for frequent PVCs. He has extensive peripheral arterial disease.  He has known occluded stent in the right proximal popliteal artery.  He is status post left SFA atherectomy and drug-coated balloon angioplasty in 2019, left common iliac and external iliac artery drug-coated balloon angioplasty in 2020.  He is also status post orbital atherectomy and balloon expandable stent placement to the right common iliac artery as well as angioplasty and drug-eluting stent placement to the right external iliac artery.  He had previous GI bleed due to diverticulosis.  Most recent angiography in March 2023 showed patent right common iliac artery stent with severe restenosis, moderate left external iliac artery stenosis and occluded distal right SFA stent with collaterals and three-vessel runoff below the knee.  I performed successful drug-coated balloon angioplasty to the right common iliac artery .  He has known history of bilateral hip arthritis.  Most recent Doppler studies in August showed an ABI of 0.65 on the right and 0.57 on the left.  This appeared stable from the prior year.  Duplex showed patent iliac arteries with moderately elevated velocities.  He has been doing reasonably well overall with no chest pain or worsening dyspnea.  He is mostly limited by severe left hip arthritis and will be getting a steroid injection in the near future.  He has occasional left calf claudication.    Past Medical History:   Diagnosis Date   Abdominal distention    Abdominal pain    Bronchitis    hx of   Cancer (HCC)    skin   CHF (congestive heart failure) (HCC)     (hx of EF 27%) s/p AICD 2004. 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     on Methimazole    Inguinal hernia    Left groin pain    Osteoporosis    on Fosamax since 2024, due for BMD in 2026    Past Surgical History:  Procedure Laterality Date   ABDOMINAL AORTOGRAM W/LOWER EXTREMITY N/A 09/03/2018   Procedure: ABDOMINAL AORTOGRAM W/LOWER EXTREMITY;  Surgeon: Darron Deatrice LABOR, MD;  [facility]: [facility];  Service: Cardiovascular;  Laterality: N/A;   ABDOMINAL AORTOGRAM W/LOWER EXTREMITY N/A 07/29/2019   Procedure: ABDOMINAL AORTOGRAM W/LOWER EXTREMITY;  Surgeon: Darron Deatrice LABOR, MD;  Location: [facility];  Service: Cardiovascular;  Laterality: N/A;   ABDOMINAL AORTOGRAM W/LOWER EXTREMITY Bilateral 03/23/2020   Procedure: ABDOMINAL AORTOGRAM W/LOWER EXTREMITY;  Surgeon: Darron Deatrice LABOR, MD;  Location: [facility];  Service: Cardiovascular;  Laterality: Bilateral;   ABDOMINAL AORTOGRAM  W/LOWER EXTREMITY N/A 11/22/2021   Procedure: ABDOMINAL AORTOGRAM W/LOWER EXTREMITY;  Surgeon: Darron Deatrice LABOR, MD;  Location: [facility];  Service: Cardiovascular;  Laterality: N/A;   APPENDECTOMY     CARDIAC CATHETERIZATION     CARDIAC DEFIBRILLATOR REMOVAL  2005   HERNIA REPAIR  09/11/11   repair of [facility]    INGUINAL HERNIA REPAIR  09/11/2011   Procedure: HERNIA REPAIR INGUINAL ADULT;  Surgeon: Redell Alm Faith, DO;  Location: [facility] OR;  Service: General;  Laterality: Left;  open left inguinal hernia with mesh   LOWER EXTREMITY ANGIOGRAM  N/A 02/05/2013   Procedure: LOWER EXTREMITY ANGIOGRAM;  Surgeon: Candyce GORMAN Reek, MD;  Location: [facility] CATH LAB;  Service: Cardiovascular;  Laterality: N/A;   PERIPHERAL VASCULAR BALLOON ANGIOPLASTY  07/29/2019   Procedure: PERIPHERAL VASCULAR BALLOON ANGIOPLASTY;  Surgeon: Darron Deatrice LABOR, MD;  Location: [facility];  Service: Cardiovascular;;   PERIPHERAL VASCULAR BALLOON ANGIOPLASTY  11/22/2021   Procedure: PERIPHERAL VASCULAR BALLOON ANGIOPLASTY;  Surgeon: Darron Deatrice LABOR, MD;  Location: [facility];  Service: Cardiovascular;;   PERIPHERAL VASCULAR INTERVENTION Left 09/03/2018   Procedure: PERIPHERAL VASCULAR INTERVENTION;  Surgeon: Darron Deatrice LABOR, MD;  Location: [facility];  Service: Cardiovascular;  Laterality: Left;   PERIPHERAL VASCULAR INTERVENTION Right 03/23/2020   Procedure: PERIPHERAL VASCULAR INTERVENTION;  Surgeon: Darron Deatrice LABOR, MD;  Location: [facility];  Service: Cardiovascular;  Laterality: Right;   RIGHT/LEFT HEART CATH AND CORONARY ANGIOGRAPHY N/A 12/15/2019   Procedure: RIGHT/LEFT HEART CATH AND CORONARY ANGIOGRAPHY;  Surgeon: Claudene Victory ORN, MD;  Location: [facility];  Service: Cardiovascular;  Laterality: N/A;     Current Outpatient Medications  Medication Sig Dispense Refill   Acetaminophen  (TYLENOL  PO) Take 500 mg by mouth at bedtime.     acetaminophen  (TYLENOL ) 650 MG CR tablet Take 1,300 mg by mouth in the morning.     Calcium  200 MG TABS Take 200 mg by mouth daily. 1000 mg IV-VO     carvedilol  (COREG ) 12.5 MG tablet TAKE 1 TABLET BY MOUTH 2 TIMES DAILY. 180 tablet 3   clopidogrel  (PLAVIX ) 75 MG tablet Take 1 tablet (75 mg total) by mouth daily with breakfast. 30 tablet 11   eplerenone  (INSPRA ) 50 MG tablet TAKE 1 TABLET BY MOUTH EVERY DAY 90 tablet 3   Evolocumab  (REPATHA  SURECLICK) 140 MG/ML SOAJ Inject 140 mg into the skin every 14 (fourteen) days. 6 mL 3   ezetimibe  (ZETIA ) 10 MG tablet TAKE 1 TABLET BY MOUTH EVERY  DAY 90 tablet 3   FOSAMAX 70 MG tablet Take 70 mg by mouth once a week.     JARDIANCE  10 MG TABS tablet TAKE 1 TABLET BY MOUTH EVERY DAY 30 tablet 6   methimazole  (TAPAZOLE ) 5 MG tablet Take 1 tablet (5 mg total) by mouth daily.     mexiletine (MEXITIL) 150 MG capsule TAKE 1 CAPSULE BY MOUTH TWICE A DAY 180 capsule 1   mirabegron  ER (MYRBETRIQ ) 50 MG TB24 tablet Take 50 mg by mouth daily.     Multiple Vitamins-Minerals (ONE-A-DAY MENS 50+ ADVANTAGE PO) Take 1 tablet by mouth daily.     PARoxetine  (PAXIL ) 40 MG tablet Take 40 mg by mouth daily.     rosuvastatin  (CRESTOR ) 40 MG tablet Take 1 tablet (40 mg total) by mouth daily. 90 tablet 3   sacubitril -valsartan  (ENTRESTO ) 97-103 MG TAKE 1 TABLET BY MOUTH TWICE A DAY 60 tablet 5   sodium chloride  (  OCEAN) 0.65 % SOLN nasal spray Place 1 spray into both nostrils daily as needed for congestion.     venlafaxine  XR (EFFEXOR -XR) 75 MG 24 hr capsule Take 75 mg by mouth daily.     diphenhydramine -acetaminophen  (TYLENOL  PM) 25-500 MG TABS tablet Take 2 tablets by mouth at bedtime. (Patient not taking: Reported on 10/13/2024)     No current facility-administered medications for this visit.    Allergies:   Spironolactone  and Sulfa antibiotics    Social History:  The patient  reports that he has been smoking cigars and cigarettes. He has a 48 pack-year smoking history. He has been exposed to tobacco smoke. He has never used smokeless tobacco. He reports current alcohol use of about 7.0 standard drinks of alcohol per week. He reports that he does not use drugs.   Family History:  The patient's family history includes Cancer in his mother.    ROS:  Please see the history of present illness.   Otherwise, review of systems are positive for none.   All other systems are reviewed and negative.    PHYSICAL EXAM: VS:  BP 110/74 (BP Location: Left Arm, Patient Position: Sitting, Cuff Size: Normal)   Pulse 68   Ht 5' 9 (1.753 m)   Wt 155 lb (70.3 kg)    SpO2 98%   BMI 22.89 kg/m  , BMI Body mass index is 22.89 kg/m. GEN: Well nourished, well developed, in no acute distress  HEENT: normal  Neck: no JVD, carotid bruits, or masses Cardiac: RRR; no murmurs, rubs, or gallops,no edema  Respiratory:  clear to auscultation bilaterally, normal work of breathing GI: soft, nontender, nondistended, + BS MS: no deformity or atrophy  Skin: warm and dry, no rash Neuro:  Strength and sensation are intact Psych: euthymic mood, full affect   EKG:  EKG is not ordered today.   Recent Labs: 01/10/2024: Hemoglobin 14.0; Platelets 219 06/11/2024: TSH 2.457 10/12/2024: B Natriuretic Peptide 120.1; BUN 19; Creatinine, Ser 1.15; Potassium 4.2; Sodium 140    Lipid Panel    Component Value Date/Time   CHOL 132 09/12/2023 0946   CHOL 115 02/19/2020 0914   TRIG 57 09/12/2023 0946   HDL 57 09/12/2023 0946   HDL 50 02/19/2020 0914   CHOLHDL 2.3 09/12/2023 0946   VLDL 11 09/12/2023 0946   LDLCALC 64 09/12/2023 0946   LDLCALC 51 02/19/2020 0914      Wt Readings from Last 3 Encounters:  10/13/24 155 lb (70.3 kg)  10/12/24 155 lb (70.3 kg)  06/24/24 153 lb 6.4 oz (69.6 kg)          08/20/2024    1:47 PM 07/17/2024    1:55 PM  PAD Screen  Previous PAD dx? Yes  Yes   Previous surgical procedure? Yes  Yes   Pain with walking? Yes  Yes   Subsides with rest? Yes  Yes   Feet/toe relief with dangling? No  No   Painful, non-healing ulcers? No  No   Extremities discolored? Yes  Yes      Manually entered by patient      ASSESSMENT AND PLAN:   1.  Peripheral arterial disease: Status post bilateral endovascular intervention on iliac arteries with known chronically occluded right SFA/popliteal artery stent.   He has moderately reduced ABI with bilateral calf claudication that is worse on the left side.  Most recent Doppler studies showed stable ABI overall with patent iliac arteries with stable velocities.  He is mostly limited by  left hip  arthritis more than claudication.  I recommend continuing medical therapy and repeat Doppler studies in August of 26.    2. Tobacco use: I again discussed the importance of smoking cessation.  He currently smokes up to a half a pack per day.  3. Hyperlipidemia: He was recently started on Repatha  and he seems to be doing well with it.  Recommended target LDL of less than 55.   4. Coronary artery disease: No anginal symptoms.    5. Chronic systolic heart failure: He is followed by the heart failure clinic .  He is currently on optimal medical therapy.    6.  Essential hypertension: Blood pressure is reasonably controlled on current medications.     Disposition:   Follow-up in 12 months.   Signed,  Deatrice Cage, MD  10/13/2024 11:34 AM    [facility]

## 2024-10-13 NOTE — Patient Instructions (Signed)
 Medication Instructions:  No changes *If you need a refill on your cardiac medications before your next appointment, please call your pharmacy*  Lab Work: None ordered If you have labs (blood work) drawn today and your tests are completely normal, you will receive your results only by: MyChart Message (if you have MyChart) OR A paper copy in the mail If you have any lab test that is abnormal or we need to change your treatment, we will call you to review the results.  Testing/Procedures: None ordered  Follow-Up: At [facility], you and your health needs are our priority.  As part of our continuing mission to provide you with exceptional heart care, our providers are all part of one team.  This team includes your primary Cardiologist (physician) and Advanced Practice Providers or APPs (Physician Assistants and Nurse Practitioners) who all work together to provide you with the care you need, when you need it.  Your next appointment:   12 month(s)  Provider:   Dr. Alvenia Aus  We recommend signing up for the patient portal called "MyChart".  Sign up information is provided on this After Visit Summary.  MyChart is used to connect with patients for Virtual Visits (Telemedicine).  Patients are able to view lab/test results, encounter notes, upcoming appointments, etc.  Non-urgent messages can be sent to your provider as well.   To learn more about what you can do with MyChart, go to [email or web address].

## 2024-10-17 ENCOUNTER — Other Ambulatory Visit (HOSPITAL_COMMUNITY): Payer: Self-pay | Admitting: Cardiology

## 2024-10-27 ENCOUNTER — Other Ambulatory Visit: Payer: Self-pay | Admitting: Family Medicine

## 2024-10-27 ENCOUNTER — Encounter: Payer: Self-pay | Admitting: Family Medicine

## 2024-10-27 MED ORDER — VENLAFAXINE HCL ER 75 MG PO CP24: 75.0000 mg | ORAL_CAPSULE | Freq: Every day | ORAL | 0 refills | Status: AC

## 2024-10-27 MED ORDER — ROSUVASTATIN CALCIUM 40 MG PO TABS: 40.0000 mg | ORAL_TABLET | Freq: Every day | ORAL | 3 refills | Status: AC

## 2024-10-27 MED ORDER — CLOPIDOGREL BISULFATE 75 MG PO TABS: 75.0000 mg | ORAL_TABLET | Freq: Every day | ORAL | 11 refills | Status: AC

## 2024-10-27 MED ORDER — PAROXETINE HCL 40 MG PO TABS: 40.0000 mg | ORAL_TABLET | Freq: Every day | ORAL | 0 refills | Status: AC

## 2024-10-27 NOTE — Telephone Encounter (Unsigned)
 Copied from CRM [id number]. Topic: General - Other >> Oct 27, 2024  8:32 AM Thersia BROCKS wrote: Reason for CRM: Patient called in wanting to know if Dr.Rucker could recommend some sports medicine dr in the [place] , as he is currently seeing someone in [place] and would like to see someone [place] would like a callback regarding this

## 2024-10-27 NOTE — Telephone Encounter (Signed)
 Copied from CRM #[id number]. Topic: Clinical - Medication Refill >> Oct 27, 2024  8:31 AM Thersia C wrote: Medication: venlafaxine  XR (EFFEXOR -XR) 75 MG 24 hr capsule PARoxetine  (PAXIL ) 40 MG tablet rosuvastatin  (CRESTOR ) 40 MG tablet  clopidogrel  (PLAVIX ) 75 MG tablet  Has the patient contacted their pharmacy? Yes (Agent: If no, request that the patient contact the pharmacy for the refill. If patient does not wish to contact the pharmacy document the reason why and proceed with request.) (Agent: If yes, when and what did the pharmacy advise?)  This is the patient's preferred pharmacy:   [facility] [place], [place] - [address] [place] [address] Phone: [phone] Fax: [phone]  Is this the correct pharmacy for this prescription? Yes If no, delete pharmacy and type the correct one.   Has the prescription been filled recently? No  Is the patient out of the medication? Yes  Has the patient been seen for an appointment in the last year OR does the patient have an upcoming appointment? Yes  Can we respond through MyChart? Yes  Agent: Please be advised that Rx refills may take up to 3 business days. We ask that you follow-up with your pharmacy.

## 2024-10-28 ENCOUNTER — Encounter: Payer: Self-pay | Admitting: Family Medicine

## 2024-11-18 LAB — LIPID PANEL

## 2024-11-19 ENCOUNTER — Ambulatory Visit: Payer: Self-pay | Admitting: Pharmacist

## 2024-11-19 LAB — LIPID PANEL
Cholesterol, Total: 108 mg/dL (ref 100–199)
HDL: 68 mg/dL
LDL CALC COMMENT:: 1.6 ratio (ref 0.0–5.0)
LDL Chol Calc (NIH): 26 mg/dL (ref 0–99)
Triglycerides: 64 mg/dL (ref 0–149)
VLDL Cholesterol Cal: 14 mg/dL (ref 5–40)

## 2024-12-25 ENCOUNTER — Ambulatory Visit: Admitting: Family Medicine
